# Patient Record
Sex: Female | Born: 1955
Health system: Southern US, Community
[De-identification: ages and names within clinical notes are randomized; demographics above are authoritative.]

## PROBLEM LIST (undated history)

## (undated) DIAGNOSIS — I400 Infective myocarditis: Secondary | ICD-10-CM

## (undated) DIAGNOSIS — K219 Gastro-esophageal reflux disease without esophagitis: Secondary | ICD-10-CM

## (undated) DIAGNOSIS — I10 Essential (primary) hypertension: Secondary | ICD-10-CM

## (undated) DIAGNOSIS — I2510 Atherosclerotic heart disease of native coronary artery without angina pectoris: Secondary | ICD-10-CM

## (undated) DIAGNOSIS — C50919 Malignant neoplasm of unspecified site of unspecified female breast: Secondary | ICD-10-CM

## (undated) DIAGNOSIS — I272 Pulmonary hypertension, unspecified: Secondary | ICD-10-CM

## (undated) DIAGNOSIS — I219 Acute myocardial infarction, unspecified: Secondary | ICD-10-CM

## (undated) HISTORY — DX: Pulmonary hypertension, unspecified: I27.20

## (undated) HISTORY — DX: Malignant neoplasm of unspecified site of unspecified female breast: C50.919

## (undated) HISTORY — PX: OTHER SURGICAL HISTORY: SHX169

## (undated) HISTORY — PX: CARDIAC CATHETERIZATION: SHX172

---

## 2009-05-04 ENCOUNTER — Ambulatory Visit (HOSPITAL_COMMUNITY): Admission: RE | Admit: 2009-05-04 | Discharge: 2009-05-04 | Payer: Self-pay | Admitting: Family Medicine

## 2010-08-16 ENCOUNTER — Other Ambulatory Visit (HOSPITAL_COMMUNITY): Payer: Self-pay | Admitting: Family Medicine

## 2010-08-16 ENCOUNTER — Ambulatory Visit (HOSPITAL_COMMUNITY)
Admission: RE | Admit: 2010-08-16 | Discharge: 2010-08-16 | Disposition: A | Discharge status: Home / Self Care | Payer: Self-pay | Source: Ambulatory Visit | Attending: Family Medicine | Admitting: Family Medicine

## 2010-08-16 ENCOUNTER — Other Ambulatory Visit: Payer: Self-pay | Admitting: *Deleted

## 2010-08-16 DIAGNOSIS — L0390 Cellulitis, unspecified: Secondary | ICD-10-CM

## 2010-08-16 DIAGNOSIS — M79609 Pain in unspecified limb: Secondary | ICD-10-CM | POA: Insufficient documentation

## 2011-05-23 ENCOUNTER — Other Ambulatory Visit (HOSPITAL_COMMUNITY): Payer: Self-pay | Admitting: Family Medicine

## 2011-05-23 DIAGNOSIS — Z139 Encounter for screening, unspecified: Secondary | ICD-10-CM

## 2011-06-01 ENCOUNTER — Ambulatory Visit (HOSPITAL_COMMUNITY)
Admission: RE | Admit: 2011-06-01 | Discharge: 2011-06-01 | Disposition: A | Discharge status: Home / Self Care | Payer: PRIVATE HEALTH INSURANCE | Source: Ambulatory Visit | Attending: Family Medicine | Admitting: Family Medicine

## 2011-06-01 DIAGNOSIS — Z139 Encounter for screening, unspecified: Secondary | ICD-10-CM

## 2011-06-01 DIAGNOSIS — Z1231 Encounter for screening mammogram for malignant neoplasm of breast: Secondary | ICD-10-CM | POA: Insufficient documentation

## 2012-01-08 ENCOUNTER — Emergency Department (HOSPITAL_COMMUNITY)
Admission: EM | Admit: 2012-01-08 | Discharge: 2012-01-08 | Disposition: A | Discharge status: Home / Self Care | Payer: Self-pay | Attending: Emergency Medicine | Admitting: Emergency Medicine

## 2012-01-08 ENCOUNTER — Encounter (HOSPITAL_COMMUNITY): Payer: Self-pay | Admitting: Emergency Medicine

## 2012-01-08 DIAGNOSIS — I10 Essential (primary) hypertension: Secondary | ICD-10-CM | POA: Insufficient documentation

## 2012-01-08 DIAGNOSIS — L259 Unspecified contact dermatitis, unspecified cause: Secondary | ICD-10-CM | POA: Insufficient documentation

## 2012-01-08 DIAGNOSIS — E119 Type 2 diabetes mellitus without complications: Secondary | ICD-10-CM | POA: Insufficient documentation

## 2012-01-08 HISTORY — DX: Essential (primary) hypertension: I10

## 2012-01-08 LAB — GLUCOSE, CAPILLARY

## 2012-01-08 MED ORDER — FAMOTIDINE 40 MG PO TABS: 40.0000 mg | ORAL_TABLET | Freq: Every day | ORAL | Status: DC

## 2012-01-08 MED ORDER — PREDNISONE 10 MG PO TABS: ORAL_TABLET | ORAL | Status: DC

## 2012-01-08 NOTE — ED Provider Notes (Cosign Needed)
History  This chart was scribed for Ward Givens, MD by Shari Heritage. The patient was seen in room APA11/APA11. Patient's care was started at 0729.     CSN: [id number]  Arrival date & time 01/08/12  0729   First MD Initiated Contact with Patient 01/08/12 [phone]      Chief Complaint  Patient presents with  . Rash    Patient is a 56 y.o. female presenting with rash. The history is provided by the patient. No language interpreter was used.  Rash  This is a new problem. The current episode started more than 1 week ago. The problem has not changed since onset.The problem is associated with an unknown factor. There has been no fever. The rash is present on the torso, left arm and right arm. Associated symptoms include itching. Pertinent negatives include no pain. Treatments tried: bleach.    HAMPTON COST is a 56 y.o. female who presents to the Emergency Department complaining of a rash onset 12 days ago. Patient describes the rash as blistery, darkened spots to her skin with associated itching. The rash started on her face with associated swelling, and spread to her breasts and arms. Patient states that the rash is mostly gone from her face, but it is still present on her arms and breasts. She says that she hadn't been working in the yard prior to the onset. Patient says that she uses various soaps and detergents, but she has used them all before. She denies having worn new clothes or eaten new foods recently. She states her son put out moth balls and sulfur around the house at about the same time because they had seen a snake. She states she washes his clothes. She hasn't been seen for this rash before. Pt states it looked like a poison ivy rash when it started. She has been putting bleach on it.   Patient works as a Electrical engineer. Patient has a medical history of HTN and diabetes. Patient takes metformin and lisinopril.    PCP - [facility]  Past Medical History    Diagnosis Date  . Hypertension   . Diabetes mellitus     Past Surgical History  Procedure Date  . Cesarean section     History reviewed. No pertinent family history.  History  Substance Use Topics  . Smoking status: Never Smoker   . Smokeless tobacco: Not on file  . Alcohol Use: No  employed Lives with son  OB History    Grav Para Term Preterm Abortions TAB SAB Ect Mult Living                  Review of Systems  Skin: Positive for itching and rash.  All other systems reviewed and are negative.    Allergies  Review of patient's allergies indicates no known allergies.  Home Medications   Previous Medications   ASPIRIN EC 81 MG TABLET    Take 81 mg by mouth daily.   BIMATOPROST (LUMIGAN) 0.01 % SOLN    Place 1 drop into both eyes at bedtime.   GLIMEPIRIDE (AMARYL) 4 MG TABLET    Take 4 mg by mouth daily.   LISINOPRIL-HYDROCHLOROTHIAZIDE (PRINZIDE,ZESTORETIC) 20-25 MG PER TABLET    Take 1 tablet by mouth daily.   METFORMIN (GLUCOPHAGE) 1000 MG TABLET    Take 1,000 mg by mouth 2 (two) times daily with a meal.   PRENATAL VIT-FE FUMARATE-FA (PRENATAL MULTIVITAMIN) TABS    Take 1 tablet by  mouth daily.    Vital signs BP 162/87  Pulse 80  Temp 98.3 F (36.8 C) (Oral)  Resp 20  Ht 5' 6.5" (1.689 m)  Wt 300 lb (136.079 kg)  BMI 47.70 kg/m2  SpO2 100%  Vital signs normal    Physical Exam  Constitutional: She is oriented to person, place, and time. She appears well-developed and well-nourished. No distress.  HENT:  Head: Normocephalic and atraumatic.  Right Ear: External ear normal.  Left Ear: External ear normal.  Eyes: Conjunctivae and EOM are normal. Pupils are equal, round, and reactive to light.  Neck: Normal range of motion. Neck supple.  Cardiovascular: Normal rate.   Pulmonary/Chest: Effort normal. No respiratory distress.  Musculoskeletal: Normal range of motion.  Neurological: She is alert and oriented to person, place, and time.  Skin: Rash  noted. Rash is vesicular.       Scattered red-based vesicles on left upper inner arm and inner antecubital area and breasts bilaterally.  Linear rash on volar aspect of right forearm that is hyperpigmented. Round nickle-sized area on the inner aspect of her innear right arm that is hyperpigmented.  No rash on face now.  Has clustering of hyperpigmented skin around her neck with a few small vesicles.  Psychiatric: She has a normal mood and affect. Her behavior is normal.    ED Course  Procedures (including critical care time) DIAGNOSTIC STUDIES: Oxygen Saturation is 100% on room air, normal by my interpretation.    COORDINATION OF CARE: 7:53am- Patient informed of current plan for treatment and evaluation and agrees with plan at this time.   Results for orders placed during the hospital encounter of 01/08/12  GLUCOSE, CAPILLARY      Component Value Range   Glucose-Capillary 128 (*) 70 - 99 mg/dL   Comment 1 Documented in Chart     Laboratory interpretation all normal except mild hyperglycemia    1. Contact dermatitis     New Prescriptions   FAMOTIDINE (PEPCID) 40 MG TABLET    Take 1 tablet (40 mg total) by mouth daily.   PREDNISONE (DELTASONE) 10 MG TABLET    Take 3 po QD x 3d , then 2 po QD x 3d then 1 po QD x 3d  Benadryl or claritin OTC  Plan discharge  Devoria Albe, MD, FACEP   MDM   I personally performed the services described in this documentation, which was scribed in my presence. The recorded information has been reviewed and considered.  Devoria Albe, MD, FACEP    Ward Givens, MD 01/08/12 [phone]

## 2012-01-08 NOTE — ED Notes (Signed)
Rash generalized to body. Pt states was treating for poison ivy but now unsure it that's what it is.

## 2012-01-08 NOTE — ED Notes (Signed)
Pt  POCT CBG reading 128.

## 2012-01-25 NOTE — Procedures (Signed)
NAMESENAYA, DICENSO            ACCOUNT NO.:  [id number]  MEDICAL RECORD NO.:  [id number]  LOCATION:  APA11                         FACILITY:  [facility]  PHYSICIAN:  Aricka Goldberger L. Juanetta Gosling, M.D.DATE OF BIRTH:  Sep 05, 1955  DATE OF PROCEDURE: DATE OF DISCHARGE:  01/08/2012                           PULMONARY FUNCTION TEST   Reason for pulmonary function testing is pneumonia and COPD. 1. Spirometry shows a moderate-to-severe ventilatory defect with     airflow obstruction. 2. Lung volumes show air trapping. 3. Airway resistance is elevated confirming the presence of airflow     obstruction. 4. There is significant improvement with inhaled bronchodilator.     Sidni Fusco L. Juanetta Gosling, M.D.     ELH/MEDQ  D:  01/24/2012  T:  01/25/2012  Job:  [id number]

## 2012-06-23 ENCOUNTER — Inpatient Hospital Stay (HOSPITAL_COMMUNITY)
Admission: AD | Admit: 2012-06-23 | Discharge: 2012-06-25 | DRG: 247 | Disposition: A | Discharge status: Home / Self Care | Payer: MEDICAID | Source: Other Acute Inpatient Hospital | Attending: Internal Medicine | Admitting: Internal Medicine

## 2012-06-23 ENCOUNTER — Inpatient Hospital Stay (HOSPITAL_COMMUNITY): Admit: 2012-06-23 | Payer: Self-pay | Admitting: Internal Medicine

## 2012-06-23 ENCOUNTER — Encounter (HOSPITAL_COMMUNITY): Payer: Self-pay | Admitting: *Deleted

## 2012-06-23 DIAGNOSIS — E1165 Type 2 diabetes mellitus with hyperglycemia: Secondary | ICD-10-CM

## 2012-06-23 DIAGNOSIS — Z23 Encounter for immunization: Secondary | ICD-10-CM

## 2012-06-23 DIAGNOSIS — IMO0001 Reserved for inherently not codable concepts without codable children: Secondary | ICD-10-CM | POA: Diagnosis present

## 2012-06-23 DIAGNOSIS — I10 Essential (primary) hypertension: Secondary | ICD-10-CM | POA: Diagnosis present

## 2012-06-23 DIAGNOSIS — D649 Anemia, unspecified: Secondary | ICD-10-CM | POA: Diagnosis present

## 2012-06-23 DIAGNOSIS — Z79899 Other long term (current) drug therapy: Secondary | ICD-10-CM

## 2012-06-23 DIAGNOSIS — K219 Gastro-esophageal reflux disease without esophagitis: Secondary | ICD-10-CM | POA: Diagnosis present

## 2012-06-23 DIAGNOSIS — Z6841 Body Mass Index (BMI) 40.0 and over, adult: Secondary | ICD-10-CM

## 2012-06-23 DIAGNOSIS — E785 Hyperlipidemia, unspecified: Secondary | ICD-10-CM

## 2012-06-23 DIAGNOSIS — Z955 Presence of coronary angioplasty implant and graft: Secondary | ICD-10-CM

## 2012-06-23 DIAGNOSIS — I214 Non-ST elevation (NSTEMI) myocardial infarction: Secondary | ICD-10-CM

## 2012-06-23 DIAGNOSIS — I2510 Atherosclerotic heart disease of native coronary artery without angina pectoris: Secondary | ICD-10-CM | POA: Diagnosis present

## 2012-06-23 DIAGNOSIS — E119 Type 2 diabetes mellitus without complications: Secondary | ICD-10-CM

## 2012-06-23 DIAGNOSIS — R072 Precordial pain: Secondary | ICD-10-CM

## 2012-06-23 DIAGNOSIS — Z7982 Long term (current) use of aspirin: Secondary | ICD-10-CM

## 2012-06-23 DIAGNOSIS — E669 Obesity, unspecified: Secondary | ICD-10-CM | POA: Diagnosis present

## 2012-06-23 HISTORY — DX: Gastro-esophageal reflux disease without esophagitis: K21.9

## 2012-06-23 LAB — TROPONIN I: Troponin I: 0.38 ng/mL (ref <0.30)

## 2012-06-23 LAB — HEPARIN LEVEL (UNFRACTIONATED): Heparin Unfractionated: 0.19 IU/mL — ABNORMAL LOW (ref 0.30–0.70)

## 2012-06-23 LAB — PRO B NATRIURETIC PEPTIDE: Pro B Natriuretic peptide (BNP): 173.3 pg/mL — ABNORMAL HIGH (ref 0–125)

## 2012-06-23 LAB — PROTIME-INR: INR: 1.05 (ref 0.00–1.49)

## 2012-06-23 MED ORDER — HYDROCHLOROTHIAZIDE 25 MG PO TABS
25.0000 mg | ORAL_TABLET | Freq: Every day | ORAL | Status: DC
  Administered 2012-06-24 – 2012-06-25 (×2): 25 mg via ORAL
  Filled 2012-06-23 (×2): qty 1

## 2012-06-23 MED ORDER — HEPARIN BOLUS VIA INFUSION
2000.0000 [IU] | Freq: Once | INTRAVENOUS | Status: AC
  Filled 2012-06-23: qty 2000

## 2012-06-23 MED ORDER — PNEUMOCOCCAL VAC POLYVALENT 25 MCG/0.5ML IJ INJ
0.5000 mL | INJECTION | INTRAMUSCULAR | Status: AC
  Administered 2012-06-24: 0.5 mL via INTRAMUSCULAR
  Filled 2012-06-23: qty 0.5

## 2012-06-23 MED ORDER — MORPHINE SULFATE 4 MG/ML IJ SOLN: 4.0000 mg | INTRAMUSCULAR | Status: DC | PRN

## 2012-06-23 MED ORDER — ZOLPIDEM TARTRATE 5 MG PO TABS
5.0000 mg | ORAL_TABLET | Freq: Every evening | ORAL | Status: DC | PRN
  Administered 2012-06-24: 5 mg via ORAL
  Filled 2012-06-23: qty 1

## 2012-06-23 MED ORDER — ATORVASTATIN CALCIUM 80 MG PO TABS
80.0000 mg | ORAL_TABLET | Freq: Every day | ORAL | Status: DC
  Administered 2012-06-23 – 2012-06-24 (×2): 80 mg via ORAL
  Filled 2012-06-23 (×3): qty 1

## 2012-06-23 MED ORDER — ALPRAZOLAM 0.25 MG PO TABS: 0.2500 mg | ORAL_TABLET | Freq: Two times a day (BID) | ORAL | Status: DC | PRN

## 2012-06-23 MED ORDER — ONDANSETRON HCL 4 MG/2ML IJ SOLN: 4.0000 mg | Freq: Four times a day (QID) | INTRAMUSCULAR | Status: DC | PRN

## 2012-06-23 MED ORDER — MORPHINE SULFATE 10 MG/5ML PO SOLN: 4.0000 mg | ORAL | Status: DC | PRN

## 2012-06-23 MED ORDER — BIMATOPROST 0.01 % OP SOLN
1.0000 [drp] | Freq: Every day | OPHTHALMIC | Status: DC
  Administered 2012-06-23: 1 [drp] via OPHTHALMIC
  Filled 2012-06-23: qty 2.5

## 2012-06-23 MED ORDER — HEPARIN (PORCINE) IN NACL 100-0.45 UNIT/ML-% IJ SOLN
1550.0000 [IU]/h | INTRAMUSCULAR | Status: DC
  Administered 2012-06-23: 200000 [IU]/h via INTRAVENOUS
  Administered 2012-06-24: 1550 [IU]/h via INTRAVENOUS
  Filled 2012-06-23 (×3): qty 250

## 2012-06-23 MED ORDER — ACETAMINOPHEN 325 MG PO TABS: 650.0000 mg | ORAL_TABLET | ORAL | Status: DC | PRN

## 2012-06-23 MED ORDER — NITROGLYCERIN 0.4 MG SL SUBL: 0.4000 mg | SUBLINGUAL_TABLET | SUBLINGUAL | Status: DC | PRN

## 2012-06-23 MED ORDER — METOPROLOL TARTRATE 25 MG PO TABS
25.0000 mg | ORAL_TABLET | Freq: Two times a day (BID) | ORAL | Status: DC
  Administered 2012-06-23 – 2012-06-25 (×4): 25 mg via ORAL
  Filled 2012-06-23 (×5): qty 1

## 2012-06-23 MED ORDER — HEPARIN BOLUS VIA INFUSION
4000.0000 [IU] | Freq: Once | INTRAVENOUS | Status: DC
  Filled 2012-06-23: qty 4000

## 2012-06-23 MED ORDER — LISINOPRIL-HYDROCHLOROTHIAZIDE 20-25 MG PO TABS: 1.0000 | ORAL_TABLET | Freq: Every day | ORAL | Status: DC

## 2012-06-23 MED ORDER — LISINOPRIL 20 MG PO TABS
20.0000 mg | ORAL_TABLET | Freq: Every day | ORAL | Status: DC
  Administered 2012-06-24 – 2012-06-25 (×2): 20 mg via ORAL
  Filled 2012-06-23 (×2): qty 1

## 2012-06-23 MED ORDER — ASPIRIN EC 81 MG PO TBEC
81.0000 mg | DELAYED_RELEASE_TABLET | Freq: Every day | ORAL | Status: DC
  Administered 2012-06-23: 81 mg via ORAL
  Filled 2012-06-23 (×2): qty 1

## 2012-06-23 MED ORDER — GLIMEPIRIDE 4 MG PO TABS
4.0000 mg | ORAL_TABLET | Freq: Every day | ORAL | Status: DC
  Administered 2012-06-25: 09:00:00 4 mg via ORAL
  Filled 2012-06-23 (×3): qty 1

## 2012-06-23 NOTE — H&P (Signed)
Sandra Schneider is an 57 y.o. female.    Chief Complaint: Palpitation and chest pain  HPI: 57 y/o female who was transferred from [facility] for chest pain and abnormal troponins. She has a PMH of HTN, DM, hyperlipidemia and family history of premature coronary artery disease (father MI at age 89, sister MI at age 28, brother MI at age 63 and oldest son with MI at age 44).  She complain of 2 weeks history of intermittent palpitations and chest pain that has gotten worse over the last 24 hours. Chest pain felt like tightness, last about 5 minutes before spontaneous relief and is associated with shortness of breath, nausea. Denies vomiting, diaphoresis, PND, orthopnea or syncope.  At [facility], EKG did not show ST elevation, but her cardiac markers showed CK 191, MB 4.6 and troponin of 0.17 which is abnormal.  Patient was transferred to [facility] to undergo a cardiac catheterization tomorrow. She is currently on Heparin drip.  She is chest pain free and hemodynamically stable.  Past Medical History  Diagnosis Date  . Hypertension   . Diabetes mellitus   . GERD (gastroesophageal reflux disease)     Past Surgical History  Procedure Laterality Date  . Cesarean section      No family history on file. Social History:  reports that she has never smoked. She does not have any smokeless tobacco history on file. She reports that she does not drink alcohol or use illicit drugs.  Allergies: No Known Allergies  Medications Prior to Admission  Medication Sig Dispense Refill  . aspirin EC 81 MG tablet Take 81 mg by mouth daily.      . bimatoprost (LUMIGAN) 0.01 % SOLN Place 1 drop into both eyes at bedtime.      . famotidine (PEPCID) 40 MG tablet Take 1 tablet (40 mg total) by mouth daily.  20 tablet  0  . glimepiride (AMARYL) 4 MG tablet Take 4 mg by mouth daily.      Marland Kitchen ibuprofen (ADVIL,MOTRIN) 800 MG tablet Take 800 mg by mouth every 8 (eight) hours as needed for pain.      Marland Kitchen  lisinopril-hydrochlorothiazide (PRINZIDE,ZESTORETIC) 20-25 MG per tablet Take 1 tablet by mouth daily.      . metFORMIN (GLUCOPHAGE) 1000 MG tablet Take 1,000 mg by mouth 2 (two) times daily with a meal.      . Prenatal Vit-Fe Fumarate-FA (PRENATAL MULTIVITAMIN) TABS Take 1 tablet by mouth daily.        No results found for this or any previous visit (from the past 48 hour(s)). No results found.  Review of Systems  Constitutional: Negative for fever, chills, weight loss, malaise/fatigue and diaphoresis.  HENT: Negative for hearing loss, ear pain, nosebleeds, congestion, sore throat, neck pain, tinnitus and ear discharge.   Eyes: Negative for blurred vision, double vision, photophobia, pain, discharge and redness.  Respiratory: Positive for shortness of breath. Negative for cough, hemoptysis, sputum production, wheezing and stridor.   Cardiovascular: Positive for chest pain and palpitations. Negative for orthopnea, claudication, leg swelling and PND.  Gastrointestinal: Negative for heartburn, nausea, vomiting, abdominal pain, diarrhea, constipation and blood in stool.  Genitourinary: Negative for dysuria, urgency, frequency, hematuria and flank pain.  Musculoskeletal: Negative for myalgias, back pain and joint pain.  Skin: Negative for itching and rash.  Neurological: Negative for dizziness, tingling, tremors, sensory change, speech change, focal weakness, seizures, weakness and headaches.  Psychiatric/Behavioral: Negative for suicidal ideas and hallucinations.    Blood pressure  140/48, pulse 75, temperature 98.4 F (36.9 C), temperature source Oral, resp. rate 18, height 5\' 6"  (1.676 m), weight 135.444 kg (298 lb 9.6 oz), last menstrual period 01/14/2011, SpO2 99.00%. Physical Exam  Constitutional: She is oriented to person, place, and time. She appears well-developed and well-nourished. No distress.  Patient is obese  HENT:  Head: Normocephalic and atraumatic.  Eyes: EOM are normal.  Right eye exhibits no discharge. Left eye exhibits no discharge. No scleral icterus.  Neck: Normal range of motion. Neck supple. No JVD present.  Cardiovascular: Normal rate, regular rhythm and normal heart sounds.  Exam reveals no gallop and no friction rub.   No murmur heard. Respiratory: Effort normal and breath sounds normal. No stridor. No respiratory distress. She has no wheezes. She has no rales. She exhibits no tenderness.  GI: She exhibits no distension. There is no tenderness. There is no rebound and no guarding.  Musculoskeletal: She exhibits no edema and no tenderness.  Neurological: She is alert and oriented to person, place, and time.  Skin: No rash noted. She is not diaphoretic. No erythema.  Psychiatric: She has a normal mood and affect.     EKG: sinus rhythm (71 bpm) with non-specific T-wave abnormalities. No ST elevation.  Assessment/Plan  1.  NSTEMI 2.  DM 3.  HTN 4.  Obesity  Patient presented to [facility] with palpitation and associated chest pain and was found to have NSTEMI.  She is currently on Heparin drip and is chest pain free.  Given the extensive family history of premature coronary artery disease, she likely has coronary artery disease as well.  I will continue Aspirin, beta-blocker and high intensity statin.  If she develops recurrent chest pain, we will treat her with nitrates and prn morphine.  I will keep patient NPO for cardiac cath in the morning and will obtain a transthoracic echocardiogram to evaluate her LV function and valves. Since we plan on cardiac cath in the morning, I will hold her Metformin.  Sandra Schneider E 06/23/2012, 7:26 PM

## 2012-06-23 NOTE — Progress Notes (Signed)
Patients troponin is 0.38, patient is not symptomatic and stable.  Notified on call PA Hurman Horn. No new order received. Will continue monitoring.

## 2012-06-23 NOTE — Progress Notes (Addendum)
ANTICOAGULATION CONSULT NOTE - Initial Consult  Pharmacy Consult for heparin Indication: chest pain/ACS  No Known Allergies  Patient Measurements: Height: 5\' 6"  (167.6 cm) Weight: 298 lb 9.6 oz (135.444 kg) IBW/kg (Calculated) : 59.3 Heparin Dosing Weight: ~90 kg  Vital Signs: Temp: 98.4 F (36.9 C) (02/09 1808) Temp src: Oral (02/09 1808) BP: 140/48 mmHg (02/09 1808) Pulse Rate: 75 (02/09 1808)  Labs: No results found for this basename: HGB, HCT, PLT, APTT, LABPROT, INR, HEPARINUNFRC, CREATININE, CKTOTAL, CKMB, TROPONINI,  in the last 72 hours  CrCl is unknown because no creatinine reading has been taken.   Medical History: Past Medical History  Diagnosis Date  . Hypertension   . Diabetes mellitus   . GERD (gastroesophageal reflux disease)     Medications:  Prescriptions prior to admission  Medication Sig Dispense Refill  . aspirin EC 81 MG tablet Take 81 mg by mouth daily.      . bimatoprost (LUMIGAN) 0.01 % SOLN Place 1 drop into both eyes at bedtime.      . famotidine (PEPCID) 40 MG tablet Take 1 tablet (40 mg total) by mouth daily.  20 tablet  0  . glimepiride (AMARYL) 4 MG tablet Take 4 mg by mouth daily.      Marland Kitchen ibuprofen (ADVIL,MOTRIN) 800 MG tablet Take 800 mg by mouth every 8 (eight) hours as needed for pain.      Marland Kitchen lisinopril-hydrochlorothiazide (PRINZIDE,ZESTORETIC) 20-25 MG per tablet Take 1 tablet by mouth daily.      . metFORMIN (GLUCOPHAGE) 1000 MG tablet Take 1,000 mg by mouth 2 (two) times daily with a meal.      . Prenatal Vit-Fe Fumarate-FA (PRENATAL MULTIVITAMIN) TABS Take 1 tablet by mouth daily.        Assessment: Sandra Schneider is a 57 yo F transferred from [facility] w/ chest pain to continue heparin per pharmacy protocol for chest pain/ACS.   Heparin drip is currently running at 12.4 ml/hr = 1240 units/hr.  Wt 135 kg.  ABW ~ 90 kg.  Per [facility] records her baseline CBC, creat and INR are normal. She was given a 5000 unit bolus and  1240 units/hr at 1249.  Goal of Therapy:  Heparin level 0.3-0.7 units/ml Monitor platelets by anticoagulation protocol: Yes   Plan:  1. Check heparin level now - will be at steady state 1. Change heparin rate to 1250 units/hr from 1240 units/hr 2. Daily HL and CBC while on heparin    Herby Abraham, Pharm.D. [phone] 06/23/2012 7:47 PM   Addendum:  Heparin level = 0.19  Plan: 1) Heparin 2000 units iv bolus x 1 2) Increase heparin to 1550 units / hr 3) Follow up AM heparin level   Thank you. Okey Regal, PharmD [phone]

## 2012-06-24 ENCOUNTER — Encounter (HOSPITAL_COMMUNITY)
Admission: AD | Disposition: A | Discharge status: Home / Self Care | Payer: Self-pay | Source: Other Acute Inpatient Hospital | Attending: Internal Medicine

## 2012-06-24 DIAGNOSIS — I214 Non-ST elevation (NSTEMI) myocardial infarction: Secondary | ICD-10-CM

## 2012-06-24 DIAGNOSIS — I219 Acute myocardial infarction, unspecified: Secondary | ICD-10-CM

## 2012-06-24 DIAGNOSIS — I2510 Atherosclerotic heart disease of native coronary artery without angina pectoris: Secondary | ICD-10-CM

## 2012-06-24 HISTORY — DX: Acute myocardial infarction, unspecified: I21.9

## 2012-06-24 HISTORY — PX: LEFT HEART CATHETERIZATION WITH CORONARY ANGIOGRAM: SHX5451

## 2012-06-24 HISTORY — PX: CORONARY STENT PLACEMENT: SHX1402

## 2012-06-24 LAB — BASIC METABOLIC PANEL
CO2: 27 mEq/L (ref 19–32)
Chloride: 96 mEq/L (ref 96–112)
Creatinine, Ser: 0.81 mg/dL (ref 0.50–1.10)
GFR calc Af Amer: >90 mL/min (ref >90)
Potassium: 4.3 mEq/L (ref 3.5–5.1)
Sodium: 132 mEq/L — ABNORMAL LOW (ref 135–145)

## 2012-06-24 LAB — TROPONIN I: Troponin I: 0.37 ng/mL (ref <0.30)

## 2012-06-24 LAB — CBC
MCV: 91.8 fL (ref 78.0–100.0)
Platelets: 311 10*3/uL (ref 150–400)
RBC: 3.54 MIL/uL — ABNORMAL LOW (ref 3.87–5.11)
WBC: 10.5 10*3/uL (ref 4.0–10.5)

## 2012-06-24 LAB — HEPARIN LEVEL (UNFRACTIONATED): Heparin Unfractionated: 0.58 IU/mL (ref 0.30–0.70)

## 2012-06-24 LAB — GLUCOSE, CAPILLARY: Glucose-Capillary: 167 mg/dL — ABNORMAL HIGH (ref 70–99)

## 2012-06-24 LAB — LIPID PANEL
Cholesterol: 156 mg/dL (ref 0–200)
Total CHOL/HDL Ratio: 4.2 RATIO
VLDL: 27 mg/dL (ref 0–40)

## 2012-06-24 LAB — HEMOGLOBIN A1C: Mean Plasma Glucose: 183 mg/dL — ABNORMAL HIGH (ref <117)

## 2012-06-24 SURGERY — LEFT HEART CATHETERIZATION WITH CORONARY ANGIOGRAM
Anesthesia: LOCAL

## 2012-06-24 MED ORDER — LIDOCAINE HCL (PF) 1 % IJ SOLN
INTRAMUSCULAR | Status: AC
  Filled 2012-06-24: qty 30

## 2012-06-24 MED ORDER — FENTANYL CITRATE 0.05 MG/ML IJ SOLN
INTRAMUSCULAR | Status: AC
  Filled 2012-06-24: qty 2

## 2012-06-24 MED ORDER — SODIUM CHLORIDE 0.9 % IV SOLN
0.2500 mg/kg/h | INTRAVENOUS | Status: DC
  Filled 2012-06-24: qty 250

## 2012-06-24 MED ORDER — BIVALIRUDIN 250 MG IV SOLR
INTRAVENOUS | Status: AC
  Filled 2012-06-24: qty 250

## 2012-06-24 MED ORDER — NITROGLYCERIN 1 MG/10 ML FOR IR/CATH LAB
INTRA_ARTERIAL | Status: AC
  Filled 2012-06-24: qty 10

## 2012-06-24 MED ORDER — PRASUGREL HCL 10 MG PO TABS
ORAL_TABLET | ORAL | Status: AC
  Administered 2012-06-25: 09:00:00 10 mg via ORAL
  Filled 2012-06-24: qty 6

## 2012-06-24 MED ORDER — ASPIRIN 81 MG PO CHEW
324.0000 mg | CHEWABLE_TABLET | ORAL | Status: AC
  Administered 2012-06-24: 324 mg via ORAL
  Filled 2012-06-24: qty 4

## 2012-06-24 MED ORDER — ASPIRIN EC 81 MG PO TBEC
81.0000 mg | DELAYED_RELEASE_TABLET | Freq: Every day | ORAL | Status: DC
  Administered 2012-06-25: 09:00:00 81 mg via ORAL
  Filled 2012-06-24: qty 1

## 2012-06-24 MED ORDER — VERAPAMIL HCL 2.5 MG/ML IV SOLN
INTRAVENOUS | Status: AC
  Filled 2012-06-24: qty 2

## 2012-06-24 MED ORDER — SODIUM CHLORIDE 0.9 % IV SOLN: INTRAVENOUS | Status: DC

## 2012-06-24 MED ORDER — PRASUGREL HCL 10 MG PO TABS
10.0000 mg | ORAL_TABLET | Freq: Every day | ORAL | Status: DC
  Administered 2012-06-25: 10 mg via ORAL
  Filled 2012-06-24: qty 1

## 2012-06-24 MED ORDER — SODIUM CHLORIDE 0.9 % IV SOLN
INTRAVENOUS | Status: AC
  Administered 2012-06-24: 22:00:00 via INTRAVENOUS

## 2012-06-24 MED ORDER — HEPARIN (PORCINE) IN NACL 2-0.9 UNIT/ML-% IJ SOLN
INTRAMUSCULAR | Status: AC
  Filled 2012-06-24: qty 1500

## 2012-06-24 MED ORDER — HEPARIN SODIUM (PORCINE) 1000 UNIT/ML IJ SOLN
INTRAMUSCULAR | Status: AC
  Filled 2012-06-24: qty 1

## 2012-06-24 MED ORDER — INSULIN ASPART 100 UNIT/ML ~~LOC~~ SOLN
0.0000 [IU] | Freq: Three times a day (TID) | SUBCUTANEOUS | Status: DC
  Administered 2012-06-25: 3 [IU] via SUBCUTANEOUS

## 2012-06-24 MED ORDER — INSULIN ASPART 100 UNIT/ML ~~LOC~~ SOLN: 0.0000 [IU] | Freq: Every day | SUBCUTANEOUS | Status: DC

## 2012-06-24 MED ORDER — SODIUM CHLORIDE 0.9 % IJ SOLN: 3.0000 mL | INTRAMUSCULAR | Status: DC | PRN

## 2012-06-24 MED ORDER — SODIUM CHLORIDE 0.9 % IV SOLN: 250.0000 mL | INTRAVENOUS | Status: DC | PRN

## 2012-06-24 MED ORDER — MIDAZOLAM HCL 2 MG/2ML IJ SOLN
INTRAMUSCULAR | Status: AC
  Filled 2012-06-24: qty 2

## 2012-06-24 MED ORDER — SODIUM CHLORIDE 0.9 % IV SOLN
0.2500 mg/kg/h | INTRAVENOUS | Status: AC
  Administered 2012-06-24: 0.25 mg/kg/h via INTRAVENOUS
  Filled 2012-06-24: qty 250

## 2012-06-24 MED ORDER — HEPARIN (PORCINE) IN NACL 2-0.9 UNIT/ML-% IJ SOLN
INTRAMUSCULAR | Status: AC
  Filled 2012-06-24: qty 1000

## 2012-06-24 MED ORDER — SODIUM CHLORIDE 0.9 % IJ SOLN: 3.0000 mL | Freq: Two times a day (BID) | INTRAMUSCULAR | Status: DC

## 2012-06-24 NOTE — CV Procedure (Signed)
Cardiac Cath Procedure Note:  Indication: NSTEMI  Procedures performed:  1) Selective coronary angiography 2) Left heart catheterization 3) Left ventriculogram  Description of procedure:   The risks and indication of the procedure were explained. Consent was signed and placed on the chart. An appropriate timeout was taken prior to the procedure. After a normal Allen's test was confirmed, the right wrist was prepped and draped in the routine sterile fashion and anesthetized with 1% local lidocaine.   A 5 FR arterial sheath was then placed in the right radial artery using a modified Seldinger technique. Systemic heparin was administered. 3mg  IV verapamil was given through the sheath. Standard catheters including a JL 3.5, JR4 and straight pigtail were used. All catheter exchanges were made over a wire.  Complications:  None apparent  Findings:  Ao Pressure: 125/59 (83) LV Pressure:  137/11/20 There was no signficant gradient across the aortic valve on pullback.  Left main: Short. Normal  LAD: Large vessel with proximal 80% lesion. Mid vessel 30%. Distal vessel has diffuse moderate disease.   LCX: Large vessel gives off 3 OMs. 99% midsection followed by 40%. Distal vessel is small with 70% lesion.   RCA: Dominant. Normal.   LV-gram done in the RAO projection: Ejection fraction = 60%. No regional wall motion abnormalities.   Assessment: 1. 2V CAD 2. Normal LV function 3. NSTEMI  Plan/Discussion:  Plan PCI of LCX & LAD.  Daniel Bensimhon 2:23 PM

## 2012-06-24 NOTE — Progress Notes (Signed)
ANTICOAGULATION CONSULT NOTE - Follow Up Consult  Pharmacy Consult for Heparin Indication: chest pain/ACS  No Known Allergies  Patient Measurements: Height: 5\' 6"  (167.6 cm) Weight: 299 lb 9.6 oz (135.898 kg) IBW/kg (Calculated) : 59.3 Heparin Dosing Weight: 90 kg  Vital Signs: Temp: 97.9 F (36.6 C) (02/10 0412) Temp src: Oral (02/10 0412) BP: 153/98 mmHg (02/10 0412) Pulse Rate: 81 (02/10 0412)  Labs:  Recent Labs  06/23/12 2031 06/23/12 2032 06/24/12 0102 06/24/12 0843 06/24/12 0846  HGB  --   --  10.8*  --   --   HCT  --   --  32.5*  --   --   PLT  --   --  311  --   --   APTT  --  66*  --   --   --   LABPROT  --  13.6  --   --   --   INR  --  1.05  --   --   --   HEPARINUNFRC  --  0.19* 0.58  --  0.47  CREATININE  --   --  0.81  --   --   TROPONINI 0.38*  --  0.37* 0.38*  --     Estimated Creatinine Clearance: 108.8 ml/min (by C-G formula based on Cr of 0.81).   Medications:  Scheduled:  . [COMPLETED] aspirin  324 mg Oral Pre-Cath  . [START ON 06/25/2012] aspirin EC  81 mg Oral Daily  . atorvastatin  80 mg Oral q1800  . bimatoprost  1 drop Both Eyes QHS  . glimepiride  4 mg Oral Q breakfast  . [COMPLETED] heparin  2,000 Units Intravenous Once  . lisinopril  20 mg Oral Daily   And  . hydrochlorothiazide  25 mg Oral Daily  . metoprolol tartrate  25 mg Oral BID  . [COMPLETED] pneumococcal 23 valent vaccine  0.5 mL Intramuscular Tomorrow-1000  . sodium chloride  3 mL Intravenous Q12H  . [DISCONTINUED] aspirin EC  81 mg Oral Daily  . [DISCONTINUED] heparin  4,000 Units Intravenous Once  . [DISCONTINUED] lisinopril-hydrochlorothiazide  1 tablet Oral Daily   Infusions:  . sodium chloride    . heparin 1,550 Units/hr (06/24/12 4540)    Assessment: 57 yo F transferred from [facility] with CP.  Continues on therapeutic heparin infusion at 1550 units/hr.  Noted plans to have cardiac cath later today.   Goal of Therapy:  Heparin level 0.3-0.7 units/ml    Plan:  Continue heparin at 1550 units/hr. Follow-up after cardiac cath later today.  [employer], Pharm.D., BCPS Clinical Pharmacist Pager [phone] 06/24/2012 10:31 AM

## 2012-06-24 NOTE — H&P (View-Only) (Signed)
 SUBJECTIVE:  No chest pain currently.  No SOB   PHYSICAL EXAM Filed Vitals:   06/23/12 1808 06/23/12 2034 06/24/12 0412  BP: 140/48 127/47 153/98  Pulse: 75 73 81  Temp: 98.4 F (36.9 C) 98.3 F (36.8 C) 97.9 F (36.6 C)  TempSrc: Oral Oral Oral  Resp: 18 19 19  Height: 5' 6" (1.676 m)    Weight: 298 lb 9.6 oz (135.444 kg)  299 lb 9.6 oz (135.898 kg)  SpO2: 99% 98% 97%   General:  No distress Lungs:  Clear Heart:  RRR Abdomen:  Positive bowel sounds, no rebound no guarding Extremities:  No edema.   LABS: Lab Results  Component Value Date   TROPONINI 0.37* 06/24/2012   Results for orders placed during the hospital encounter of 06/23/12 (from the past 24 hour(s))  TROPONIN I     Status: Abnormal   Collection Time    06/23/12  8:31 PM      Result Value Range   Troponin I 0.38 (*) <0.30 ng/mL  PRO B NATRIURETIC PEPTIDE     Status: Abnormal   Collection Time    06/23/12  8:31 PM      Result Value Range   Pro B Natriuretic peptide (BNP) 173.3 (*) 0 - 125 pg/mL  PROTIME-INR     Status: None   Collection Time    06/23/12  8:32 PM      Result Value Range   Prothrombin Time 13.6  11.6 - 15.2 seconds   INR 1.05  0.00 - 1.49  APTT     Status: Abnormal   Collection Time    06/23/12  8:32 PM      Result Value Range   aPTT 66 (*) 24 - 37 seconds  TSH     Status: None   Collection Time    06/23/12  8:32 PM      Result Value Range   TSH 1.406  0.350 - 4.500 uIU/mL  MAGNESIUM     Status: None   Collection Time    06/23/12  8:32 PM      Result Value Range   Magnesium 1.7  1.5 - 2.5 mg/dL  HEMOGLOBIN A1C     Status: Abnormal   Collection Time    06/23/12  8:32 PM      Result Value Range   Hemoglobin A1C 8.0 (*) <5.7 %   Mean Plasma Glucose 183 (*) <117 mg/dL  HEPARIN LEVEL (UNFRACTIONATED)     Status: Abnormal   Collection Time    06/23/12  8:32 PM      Result Value Range   Heparin Unfractionated 0.19 (*) 0.30 - 0.70 IU/mL  GLUCOSE, CAPILLARY     Status:  Abnormal   Collection Time    06/23/12  9:35 PM      Result Value Range   Glucose-Capillary 252 (*) 70 - 99 mg/dL   Comment 1 Documented in Chart     Comment 2 Notify RN    TROPONIN I     Status: Abnormal   Collection Time    06/24/12  1:02 AM      Result Value Range   Troponin I 0.37 (*) <0.30 ng/mL  CBC     Status: Abnormal   Collection Time    06/24/12  1:02 AM      Result Value Range   WBC 10.5  4.0 - 10.5 K/uL   RBC 3.54 (*) 3.87 - 5.11 MIL/uL   Hemoglobin 10.8 (*) 12.0 -   15.0 g/dL   HCT 32.5 (*) 36.0 - 46.0 %   MCV 91.8  78.0 - 100.0 fL   MCH 30.5  26.0 - 34.0 pg   MCHC 33.2  30.0 - 36.0 g/dL   RDW 13.0  11.5 - 15.5 %   Platelets 311  150 - 400 K/uL  BASIC METABOLIC PANEL     Status: Abnormal   Collection Time    06/24/12  1:02 AM      Result Value Range   Sodium 132 (*) 135 - 145 mEq/L   Potassium 4.3  3.5 - 5.1 mEq/L   Chloride 96  96 - 112 mEq/L   CO2 27  19 - 32 mEq/L   Glucose, Bld 195 (*) 70 - 99 mg/dL   BUN 19  6 - 23 mg/dL   Creatinine, Ser 0.81  0.50 - 1.10 mg/dL   Calcium 9.5  8.4 - 10.5 mg/dL   GFR calc non Af Amer 79 (*) >90 mL/min   GFR calc Af Amer >90  >90 mL/min  HEPARIN LEVEL (UNFRACTIONATED)     Status: None   Collection Time    06/24/12  1:02 AM      Result Value Range   Heparin Unfractionated 0.58  0.30 - 0.70 IU/mL  LIPID PANEL     Status: Abnormal   Collection Time    06/24/12  1:02 AM      Result Value Range   Cholesterol 156  0 - 200 mg/dL   Triglycerides 135  <150 mg/dL   HDL 37 (*) >39 mg/dL   Total CHOL/HDL Ratio 4.2     VLDL 27  0 - 40 mg/dL   LDL Cholesterol 92  0 - 99 mg/dL  GLUCOSE, CAPILLARY     Status: Abnormal   Collection Time    06/24/12  6:31 AM      Result Value Range   Glucose-Capillary 167 (*) 70 - 99 mg/dL   Comment 1 Documented in Chart     Comment 2 Notify RN      Intake/Output Summary (Last 24 hours) at 06/24/12 0827 Last data filed at 06/24/12 0116  Gross per 24 hour  Intake      0 ml  Output    850  ml  Net   -850 ml    EKG:  Sinus rate 73, no acute ST T wave changes. Poor anterior R wave progression.  06/24/2012  ASSESSMENT AND PLAN:  NSTEMI:  Cath later this morning.  Answered all questions.    Diabetes:   A1C is not at target.   We discussed this.  We discussed diet.  She eats sugar.  Continue current medications.  Obesity:  Educate.   She did not want a dietary consult.   HTN:  I will titrate ACE inhibitor of BP remains elevated.    Chrissy Ealey 06/24/2012 8:27 AM   

## 2012-06-24 NOTE — Progress Notes (Signed)
SUBJECTIVE:  No chest pain currently.  No SOB   PHYSICAL EXAM Filed Vitals:   06/23/12 1808 06/23/12 2034 06/24/12 0412  BP: 140/48 127/47 153/98  Pulse: 75 73 81  Temp: 98.4 F (36.9 C) 98.3 F (36.8 C) 97.9 F (36.6 C)  TempSrc: Oral Oral Oral  Resp: 18 19 19   Height: 5\' 6"  (1.676 m)    Weight: 298 lb 9.6 oz (135.444 kg)  299 lb 9.6 oz (135.898 kg)  SpO2: 99% 98% 97%   General:  No distress Lungs:  Clear Heart:  RRR Abdomen:  Positive bowel sounds, no rebound no guarding Extremities:  No edema.   LABS: Lab Results  Component Value Date   TROPONINI 0.37* 06/24/2012   Results for orders placed during the hospital encounter of 06/23/12 (from the past 24 hour(s))  TROPONIN I     Status: Abnormal   Collection Time    06/23/12  8:31 PM      Result Value Range   Troponin I 0.38 (*) <0.30 ng/mL  PRO B NATRIURETIC PEPTIDE     Status: Abnormal   Collection Time    06/23/12  8:31 PM      Result Value Range   Pro B Natriuretic peptide (BNP) 173.3 (*) 0 - 125 pg/mL  PROTIME-INR     Status: None   Collection Time    06/23/12  8:32 PM      Result Value Range   Prothrombin Time 13.6  11.6 - 15.2 seconds   INR 1.05  0.00 - 1.49  APTT     Status: Abnormal   Collection Time    06/23/12  8:32 PM      Result Value Range   aPTT 66 (*) 24 - 37 seconds  TSH     Status: None   Collection Time    06/23/12  8:32 PM      Result Value Range   TSH 1.406  0.350 - 4.500 uIU/mL  MAGNESIUM     Status: None   Collection Time    06/23/12  8:32 PM      Result Value Range   Magnesium 1.7  1.5 - 2.5 mg/dL  HEMOGLOBIN W1X     Status: Abnormal   Collection Time    06/23/12  8:32 PM      Result Value Range   Hemoglobin A1C 8.0 (*) <5.7 %   Mean Plasma Glucose 183 (*) <117 mg/dL  HEPARIN LEVEL (UNFRACTIONATED)     Status: Abnormal   Collection Time    06/23/12  8:32 PM      Result Value Range   Heparin Unfractionated 0.19 (*) 0.30 - 0.70 IU/mL  GLUCOSE, CAPILLARY     Status:  Abnormal   Collection Time    06/23/12  9:35 PM      Result Value Range   Glucose-Capillary 252 (*) 70 - 99 mg/dL   Comment 1 Documented in Chart     Comment 2 Notify RN    TROPONIN I     Status: Abnormal   Collection Time    06/24/12  1:02 AM      Result Value Range   Troponin I 0.37 (*) <0.30 ng/mL  CBC     Status: Abnormal   Collection Time    06/24/12  1:02 AM      Result Value Range   WBC 10.5  4.0 - 10.5 K/uL   RBC 3.54 (*) 3.87 - 5.11 MIL/uL   Hemoglobin 10.8 (*) 12.0 -  15.0 g/dL   HCT 16.1 (*) 09.6 - 04.5 %   MCV 91.8  78.0 - 100.0 fL   MCH 30.5  26.0 - 34.0 pg   MCHC 33.2  30.0 - 36.0 g/dL   RDW 40.9  81.1 - 91.4 %   Platelets 311  150 - 400 K/uL  BASIC METABOLIC PANEL     Status: Abnormal   Collection Time    06/24/12  1:02 AM      Result Value Range   Sodium 132 (*) 135 - 145 mEq/L   Potassium 4.3  3.5 - 5.1 mEq/L   Chloride 96  96 - 112 mEq/L   CO2 27  19 - 32 mEq/L   Glucose, Bld 195 (*) 70 - 99 mg/dL   BUN 19  6 - 23 mg/dL   Creatinine, Ser 7.82  0.50 - 1.10 mg/dL   Calcium 9.5  8.4 - 95.6 mg/dL   GFR calc non Af Amer 79 (*) >90 mL/min   GFR calc Af Amer >90  >90 mL/min  HEPARIN LEVEL (UNFRACTIONATED)     Status: None   Collection Time    06/24/12  1:02 AM      Result Value Range   Heparin Unfractionated 0.58  0.30 - 0.70 IU/mL  LIPID PANEL     Status: Abnormal   Collection Time    06/24/12  1:02 AM      Result Value Range   Cholesterol 156  0 - 200 mg/dL   Triglycerides 213  <086 mg/dL   HDL 37 (*) >57 mg/dL   Total CHOL/HDL Ratio 4.2     VLDL 27  0 - 40 mg/dL   LDL Cholesterol 92  0 - 99 mg/dL  GLUCOSE, CAPILLARY     Status: Abnormal   Collection Time    06/24/12  6:31 AM      Result Value Range   Glucose-Capillary 167 (*) 70 - 99 mg/dL   Comment 1 Documented in Chart     Comment 2 Notify RN      Intake/Output Summary (Last 24 hours) at 06/24/12 0827 Last data filed at 06/24/12 0116  Gross per 24 hour  Intake      0 ml  Output    850  ml  Net   -850 ml    EKG:  Sinus rate 73, no acute ST T wave changes. Poor anterior R wave progression.  06/24/2012  ASSESSMENT AND PLAN:  NSTEMI:  Cath later this morning.  Answered all questions.    Diabetes:   A1C is not at target.   We discussed this.  We discussed diet.  She eats sugar.  Continue current medications.  Obesity:  Educate.   She did not want a dietary consult.   HTN:  I will titrate ACE inhibitor of BP remains elevated.    Fayrene Fearing [facility] 06/24/2012 8:27 AM

## 2012-06-24 NOTE — Progress Notes (Signed)
  Echocardiogram 2D Echocardiogram has been performed.  Sandra Schneider 06/24/2012, 12:10 PM

## 2012-06-24 NOTE — Progress Notes (Signed)
ANTICOAGULATION CONSULT NOTE - Follow Up  Consult  Pharmacy Consult for heparin Indication: chest pain/ACS  No Known Allergies  Patient Measurements: Height: 5\' 6"  (167.6 cm) Weight: 298 lb 9.6 oz (135.444 kg) IBW/kg (Calculated) : 59.3 Heparin Dosing Weight: ~90 kg  Vital Signs: Temp: 98.3 F (36.8 C) (02/09 2034) Temp src: Oral (02/09 2034) BP: 127/47 mmHg (02/09 2034) Pulse Rate: 73 (02/09 2034)  Labs:  Recent Labs  06/23/12 2031 06/23/12 2032 06/24/12 0102  HGB  --   --  10.8*  HCT  --   --  32.5*  PLT  --   --  311  APTT  --  66*  --   LABPROT  --  13.6  --   INR  --  1.05  --   HEPARINUNFRC  --  0.19* 0.58  TROPONINI 0.38*  --   --     CrCl is unknown because no creatinine reading has been taken.   Medical History: Past Medical History  Diagnosis Date  . Hypertension   . Diabetes mellitus   . GERD (gastroesophageal reflux disease)     Medications:  Prescriptions prior to admission  Medication Sig Dispense Refill  . aspirin EC 81 MG tablet Take 81 mg by mouth daily.      . bimatoprost (LUMIGAN) 0.01 % SOLN Place 1 drop into both eyes at bedtime.      . famotidine (PEPCID) 40 MG tablet Take 1 tablet (40 mg total) by mouth daily.  20 tablet  0  . glimepiride (AMARYL) 4 MG tablet Take 4 mg by mouth daily.      Marland Kitchen ibuprofen (ADVIL,MOTRIN) 800 MG tablet Take 800 mg by mouth every 8 (eight) hours as needed for pain.      Marland Kitchen lisinopril-hydrochlorothiazide (PRINZIDE,ZESTORETIC) 20-25 MG per tablet Take 1 tablet by mouth daily.      . metFORMIN (GLUCOPHAGE) 1000 MG tablet Take 1,000 mg by mouth 2 (two) times daily with a meal.      . Prenatal Vit-Fe Fumarate-FA (PRENATAL MULTIVITAMIN) TABS Take 1 tablet by mouth daily.        Assessment: Sandra Schneider is a 57 yo F transferred from [facility] w/ chest pain to continue heparin per pharmacy protocol for chest pain/ACS.    Heparin level (0.58) is at-goal on 1550 units/hr.   Goal of Therapy:  Heparin  level 0.3-0.7 units/ml Monitor platelets by anticoagulation protocol: Yes   Plan:  1. Continue IV heparin at 1550 units/hr.  2. Heparin level in 6 hours to confirm dosing.   Lorre Munroe, PharmD 06/24/2012 1:47 AM

## 2012-06-24 NOTE — Progress Notes (Signed)
Utilization Review Completed.Sandra Schneider T2/02/2013

## 2012-06-24 NOTE — Interval H&P Note (Signed)
History and Physical Interval Note:  06/24/2012 1:45 PM  Sandra Schneider  has presented today for surgery, with the diagnosis of NSTEMI  The various methods of treatment have been discussed with the patient and family. After consideration of risks, benefits and other options for treatment, the patient has consented to  Procedure(s): LEFT HEART CATHETERIZATION WITH CORONARY ANGIOGRAM (N/A) as a surgical intervention .  The patient's history has been reviewed, patient examined, no change in status, stable for surgery.  I have reviewed the patient's chart and labs.  Questions were answered to the patient's satisfaction.     Vaanya Shambaugh

## 2012-06-24 NOTE — CV Procedure (Signed)
   Cardiac Catheterization Procedure Note  Name: Sandra HOEFER MRN: [id number] DOB: 1956-03-24  Procedure:  PTCA and stenting of the proximal/mid left circumflex with drug-eluting stent placement, direct stenting of the proximal LAD with drug-eluting stent.  Indication: Non-ST elevation myocardial infarction.  PCI Note:  Following the diagnostic procedure, the decision was made to proceed with PCI. The radial sheath was upsized to a 6 [place]. Weight-based bivalirudin was given for anticoagulation. Once a therapeutic ACT was achieved, a 6 [place] XB 3.0 guide catheter was inserted. The left main coronary artery was very short. The guiding catheter either engaged the left circumflex or the LAD. Otherwise it would disengage the left main coronary artery. Thus, the catheter was selectively placed in the left circumflex for the initial PCI and then in the LAD.  An intuition coronary guidewire was used to cross the lesion in the proximal left circumflex.  The lesion was predilated with a 2.5 x 15 mm balloon. I gave nitroglycerin to evaluate the second lesion in the mid left circumflex. It appeared worse and estimated to be 50-60%. Thus, I decided to cover both the proximal and mid lesions with one stent.  The lesion was then stented with a 3.0 x 32 mm Promus drug-eluting stent.  The stent was postdilated with a 3.5 noncompliant balloon.  Following PCI, there was 0% residual stenosis and TIMI-3 flow. Final angiography confirmed an excellent result. The wire was then removed. The guiding catheter engaged the LAD. The lesion in the proximal LAD was direct stented with a 3.0 x 20 mm Promus drug-eluting stent. The stent was postdilated proximally with a 3.5 x 15 mm noncompliant balloon. Angiography showed excellent results. The patient tolerated the procedure well. There were no immediate procedural complications. A TR band was used for radial hemostasis. The patient was transferred to the post catheterization  recovery area for further monitoring.  PCI Data: Vessel - proximal and mid left circumflex/Segment - proximal and mid Percent Stenosis (pre)  95% TIMI-flow 3 Stent 3.0 x 32 mm Promus drug-eluting stent postdilated with a 3.5 noncompliant balloon. Percent Stenosis (post) 0% TIMI-flow (post) 3   Vessel - LAD/Segment - proximal  Percent Stenosis (pre)  85% TIMI-flow 3 Stent 3.0 x 20 mm Promus drug-eluting stent postdilated with a 3.5 noncompliant balloon. Percent Stenosis (post) 0% TIMI-flow (post) 3  Final Conclusions:   Successful angioplasty and drug-eluting stent placement to the proximal/mid left circumflex as well as proximal left anterior descending artery.   Recommendations:  Recommend dual antiplatelet therapy for at least one year. Aggressive treatment of risk factors is recommended.  [name] 06/24/2012, 3:37 PM

## 2012-06-24 NOTE — Care Management Note (Signed)
    Page 1 of 1   06/25/2012     11:46:34 AM   CARE MANAGEMENT NOTE 06/25/2012  Patient:  Sandra Schneider, Sandra Schneider   Account Number:  [id number]  Date Initiated:  06/24/2012  Documentation initiated by:  AMERSON,JULIE  Subjective/Objective Assessment:   PT ADM ON 06/23/12 WITH NSTEMI.  PTA, PT INDEPENDENT OF ADLS.     Action/Plan:   WILL FOLLOW FOR HOME NEEDS AS PT PROGRESSES   Anticipated DC Date:  06/26/2012   Anticipated DC Plan:  HOME/SELF CARE         Choice offered to / List presented to:             Status of service:  In process, will continue to follow Medicare Important Message given?   (If response is "NO", the following Medicare IM given date fields will be blank) Date Medicare IM given:   Date Additional Medicare IM given:    Discharge Disposition:    Per UR Regulation:  Reviewed for med. necessity/level of care/duration of stay  If discussed at Long Length of Stay Meetings, dates discussed:    Comments:  06/25/12 @1040  Oletta Cohn, RN, BSN, NCM [phone] CM consulted on Effient.  Pt is self pay and currently utilizes [facility] $4 drug list.  She was given the Effient packet and co-pay card.  No other needs at this time.

## 2012-06-25 DIAGNOSIS — I10 Essential (primary) hypertension: Secondary | ICD-10-CM

## 2012-06-25 DIAGNOSIS — E119 Type 2 diabetes mellitus without complications: Secondary | ICD-10-CM

## 2012-06-25 DIAGNOSIS — D649 Anemia, unspecified: Secondary | ICD-10-CM

## 2012-06-25 DIAGNOSIS — I2510 Atherosclerotic heart disease of native coronary artery without angina pectoris: Secondary | ICD-10-CM

## 2012-06-25 DIAGNOSIS — E1165 Type 2 diabetes mellitus with hyperglycemia: Secondary | ICD-10-CM | POA: Insufficient documentation

## 2012-06-25 DIAGNOSIS — E785 Hyperlipidemia, unspecified: Secondary | ICD-10-CM

## 2012-06-25 DIAGNOSIS — I214 Non-ST elevation (NSTEMI) myocardial infarction: Secondary | ICD-10-CM | POA: Diagnosis present

## 2012-06-25 LAB — GLUCOSE, CAPILLARY

## 2012-06-25 LAB — CBC
HCT: 32.8 % — ABNORMAL LOW (ref 36.0–46.0)
MCH: 30.9 pg (ref 26.0–34.0)
MCHC: 33.8 g/dL (ref 30.0–36.0)
MCV: 91.4 fL (ref 78.0–100.0)
RDW: 13 % (ref 11.5–15.5)

## 2012-06-25 LAB — BASIC METABOLIC PANEL
BUN: 16 mg/dL (ref 6–23)
Creatinine, Ser: 0.86 mg/dL (ref 0.50–1.10)
GFR calc Af Amer: 85 mL/min — ABNORMAL LOW (ref >90)
GFR calc non Af Amer: 74 mL/min — ABNORMAL LOW (ref >90)
Glucose, Bld: 188 mg/dL — ABNORMAL HIGH (ref 70–99)

## 2012-06-25 MED ORDER — ATORVASTATIN CALCIUM 80 MG PO TABS: 80.0000 mg | ORAL_TABLET | Freq: Every day | ORAL | Status: DC

## 2012-06-25 MED ORDER — METOPROLOL TARTRATE 25 MG PO TABS: 25.0000 mg | ORAL_TABLET | Freq: Two times a day (BID) | ORAL | Status: DC

## 2012-06-25 MED ORDER — METFORMIN HCL 1000 MG PO TABS: 1000.0000 mg | ORAL_TABLET | Freq: Two times a day (BID) | ORAL | Status: DC

## 2012-06-25 MED ORDER — PRASUGREL HCL 10 MG PO TABS: 10.0000 mg | ORAL_TABLET | Freq: Every day | ORAL | Status: DC

## 2012-06-25 MED ORDER — NITROGLYCERIN 0.4 MG SL SUBL: 0.4000 mg | SUBLINGUAL_TABLET | SUBLINGUAL | Status: DC | PRN

## 2012-06-25 MED FILL — Dextrose Inj 5%: INTRAVENOUS | Qty: 50 | Status: AC

## 2012-06-25 NOTE — Discharge Summary (Addendum)
Discharge Summary   Patient ID: Sandra Schneider,  MRN: [id number], DOB/AGE: 58-May-1957 58 y.o.  Admit date: 06/23/2012 Discharge date: 06/25/2012  Primary Physician: Default, Provider, MD Primary Cardiologist: new- seen by Dr. Antoine Poche this admission; to follow-up in   Discharge Diagnoses Principal Problem:   NSTEMI (non-ST elevated myocardial infarction)  - s/p cardiac cath 06/24/12: revealing 80% prox, 30% mid LAD lesions, 99% mid, 40% mid, 70% distal LCx, LVEF 60%  - s/p PCI 06/24/12: DES-prox LAD, DES-mid LCx  - ASA/Effient x 12 months Active Problems:   CAD in native artery  - To resume ASA/Effient/ACEi/BB/statin/NTG SL PRN on discharge   Essential hypertension  - Antihypertensives will be up-titrated as needed as an outpatient   Uncontrolled diabetes mellitus  - Hgb A1C 8.0%  - Metformin to resume 48 hours post-cath  - Follow-up with PCP for further management   Hyperlipidemia  - High-dose atorvastatin added  - Check LFTs in 6 weeks   Normocytic anemia  - To follow-up with PCP for this   Morbid obesity  - Diet & exercise stressed  Allergies No Known Allergies  Diagnostic Studies/Procedures  CHEST X-RAY - AT [facility] - 06/23/12  No infiltrate or effusion, mildly enlarged cardiac silhouette,, possibly very mild pulmonary vascular congestion.   TRANSTHORACIC ECHOCARDIOGRAM - 06/24/12  - Left ventricle: The cavity size was normal. Wall thickness was normal. Systolic function was normal. The estimated ejection fraction was in the range of 55% to 60%. Wall motion was normal; there were no regional wall motion abnormalities. Doppler parameters are consistent with abnormal left ventricular relaxation (grade 1 diastolic dysfunction). Doppler parameters are consistent with high ventricular filling pressure.  - Mitral valve: Calcified annulus. - Left atrium: The atrium was mildly dilated. - Atrial septum: There was increased thickness of the septum,  consistent with lipomatous hypertrophy.  CARDIAC CATHETERIZATION - 06/24/12  Findings:  Ao Pressure: 125/59 (83)  LV Pressure: 137/11/20  There was no signficant gradient across the aortic valve on pullback.  Left main: Short. Normal  LAD: Large vessel with proximal 80% lesion. Mid vessel 30%. Distal vessel has diffuse moderate disease.  LCX: Large vessel gives off 3 OMs. 99% midsection followed by 40%. Distal vessel is small with 70% lesion.  RCA: Dominant. Normal.  LV-gram done in the RAO projection: Ejection fraction = 60%. No regional wall motion abnormalities.  Assessment:  1. 2V CAD  2. Normal LV function  3. NSTEMI  PERCUTANEOUS CORONARY INTERVENTION - 06/24/12  Vessel - proximal and mid left circumflex/Segment - proximal and mid  Percent Stenosis (pre) 95%  TIMI-flow 3  Stent 3.0 x 32 mm Promus drug-eluting stent postdilated with a 3.5 noncompliant balloon.  Percent Stenosis (post) 0%  TIMI-flow (post) 3  Vessel - LAD/Segment - proximal  Percent Stenosis (pre) 85%  TIMI-flow 3  Stent 3.0 x 20 mm Promus drug-eluting stent postdilated with a 3.5 noncompliant balloon.  Percent Stenosis (post) 0%  TIMI-flow (post) 3  Final Conclusions:  Successful angioplasty and drug-eluting stent placement to the proximal/mid left circumflex as well as proximal left anterior descending artery.   History of Present Illness   Sandra Schneider is a 57 y.o. African American female with past medical history significant for hypertension, diabetes, gastroesophageal reflux disease osteoarthritis. She had no prior cardiac history, but does endorse a significant family history of premature CAD. She presents more emergency department the morning of 06/19/38 with complaints of intermittent heart racing and chest discomfort. Heart racing and occurring for  the prior 2 weeks subsiding after she would sit up lasting generally about 5 minutes. The Friday prior to admission, she endorsed an episode of heart  racing with associated chest discomfort lasting for approximately 5 minutes described as "tired." There was no relation to meals or episodes of indigestion. EKG revealed no evidence  Of ischemia. And initial trop-I returned at 0.17, CK-MB 4.6 and total CK 191. BNP returned at 43. Free T4-1 0.03, TSH 0.70. Given her significant cardiac risk factors, the plan was made to pursue diagnostic cardiac catheterization to be performed at [facility].  She was heparinized. Indications, risks and benefits were discussed with the patient, the plan was made to proceed.  Hospital Course   She arrived on 06/24/12 in stable condition. her metformin was held in anticipation of cardiac catheterization. A subsequent troponin did return mildly elevated at 0.38. Chest pain remain well-controlled and heparin had been resumed on transfer. Prior to cardiac cath, she underwent TTE which revealed the above findings. Hemoglobin A1c did return elevated at 8%. She was then informed, consented and prepped for cardiac cath. As above this revealed 95% prox-mid LCx, 85% prox LAD, LVEF 60%. DES x 2 was placed to these lesions on subsequent PCI. The patient tolerated both procedures well without complications. Recommendation was made to continue dual antiplatelet therapy-ASA/Effient to x12 months. In addition, aggressive risk factor reduction is recommended. She was evaluated by Dr. Antoine Poche this morning and deemed stable for discharge. She will follow-up as noted below. She will be discharged on the medications outlined below. Effient assistance has been arranged. This information, including post-cath instructions, has been clearly outlined in the discharge AVS.   Discharge Vitals:  Blood pressure 119/50, pulse 74, temperature 97.8 F (36.6 C), temperature source Oral, resp. rate 16, height 5\' 6"  (1.676 m), weight 136 kg (299 lb 13.2 oz), last menstrual period 01/14/2011, SpO2 96.00%.   Weight change: 0.556 kg (1 lb 3.6  oz)  Labs: Recent Labs     06/24/12  0102  06/25/12  0611  WBC  10.5  8.5  HGB  10.8*  11.1*  HCT  32.5*  32.8*  MCV  91.8  91.4  PLT  311  319   Recent Labs Lab 06/24/12 0102 06/25/12 0611  NA 132* 135  K 4.3 4.4  CL 96 99  CO2 27 27  BUN 19 16  CREATININE 0.81 0.86  CALCIUM 9.5 9.5  GLUCOSE 195* 188*   Recent Labs     06/23/12  2032  HGBA1C  8.0*   Recent Labs     06/23/12  2031  06/24/12  0102  06/24/12  0843  TROPONINI  0.38*  0.37*  0.38*   Recent Labs     06/24/12  0102  CHOL  156  HDL  37*  LDLCALC  92  TRIG  135  CHOLHDL  4.2    Recent Labs  06/23/12 2032  TSH 1.406    Disposition:  Discharge Orders   Future Appointments Provider Department Dept Phone   07/02/2012 1:40 PM Jodelle Gross, NP [facility] [place] [phone]   Future Orders Complete By Expires     Amb Referral to Cardiac Rehabilitation  As directed     Diet - low sodium heart healthy  As directed     Increase activity slowly  As directed       Follow-up Information   Follow up with Joni Reining, NP On 07/02/2012. (At 1:40 PM for follow-up. )  Contact information:   [address] [place] [address] [phone]       Discharge Medications:    Medication List    STOP taking these medications       ibuprofen 800 MG tablet  Commonly known as:  ADVIL,MOTRIN      TAKE these medications       aspirin EC 81 MG tablet  Take 81 mg by mouth daily.     atorvastatin 80 MG tablet  Commonly known as:  LIPITOR  Take 1 tablet (80 mg total) by mouth daily at 6 PM.     famotidine 40 MG tablet  Commonly known as:  PEPCID  Take 1 tablet (40 mg total) by mouth daily.     glimepiride 4 MG tablet  Commonly known as:  AMARYL  Take 4 mg by mouth daily.     lisinopril-hydrochlorothiazide 20-25 MG per tablet  Commonly known as:  PRINZIDE,ZESTORETIC  Take 1 tablet by mouth daily.     LUMIGAN 0.01 % Soln  Generic drug:  bimatoprost  Place 1  drop into both eyes at bedtime.     metFORMIN 1000 MG tablet  Commonly known as:  GLUCOPHAGE  Take 1 tablet (1,000 mg total) by mouth 2 (two) times daily with a meal.  Start taking on:  06/27/2012     metoprolol tartrate 25 MG tablet  Commonly known as:  LOPRESSOR  Take 1 tablet (25 mg total) by mouth 2 (two) times daily.     nitroGLYCERIN 0.4 MG SL tablet  Commonly known as:  NITROSTAT  Place 1 tablet (0.4 mg total) under the tongue every 5 (five) minutes x 3 doses as needed for chest pain.     prasugrel 10 MG Tabs  Commonly known as:  EFFIENT  Take 1 tablet (10 mg total) by mouth daily.     prenatal multivitamin Tabs  Take 1 tablet by mouth daily.       Outstanding Labs/Studies: LFTs in 6 weeks  Duration of Discharge Encounter: Greater than 30 minutes including physician time.  Signed, R. Hurman Horn, PA-C 06/25/2012, 10:32 AM   Cardiology Attending  Agree with above. Ok for discharge.  Leonia Reeves.D.

## 2012-06-25 NOTE — Progress Notes (Signed)
CARDIAC REHAB PHASE I   PRE:  Rate/Rhythm: 74SR  BP:  Supine: 119/50  Sitting:   Standing:    SaO2:   MODE:  Ambulation: 800 ft   POST:  Rate/Rhythem: 91SR  BP:  Supine:   Sitting: 134/37  Standing:    SaO2:  0750-0853 Pt walked 800 ft with steady gait. Tolerated well. Right hip hurting a little with walk. Denied CP. Education completed with pt. Understanding voiced. Pt states that her HgA1C is usually better and she is going to do better with diet. Gave heart healthy and diabetic diets. Discussed CRP 2 and pt gave permission to refer to [place]. Left financial form for pt.  Duanne Limerick

## 2012-06-25 NOTE — Progress Notes (Signed)
SUBJECTIVE:  No chest pain.  No SOB   PHYSICAL EXAM Filed Vitals:   06/24/12 1946 06/24/12 2210 06/25/12 0010 06/25/12 0536  BP:  116/55 145/45 136/47  Pulse:  68 70 74  Temp: 97.3 F (36.3 C)  97.6 F (36.4 C) 98.4 F (36.9 C)  TempSrc: Oral  Oral Oral  Resp:   17 17  Height:      Weight:   299 lb 13.2 oz (136 kg)   SpO2: 96%  95% 95%   General:  No distress Lungs:  Clear Heart:  RRR Abdomen:  Positive bowel sounds, no rebound no guarding Extremities: Right wrist without bruising or bleeding.  LABS: Lab Results  Component Value Date   TROPONINI 0.38* 06/24/2012   Results for orders placed during the hospital encounter of 06/23/12 (from the past 24 hour(s))  TROPONIN I     Status: Abnormal   Collection Time    06/24/12  8:43 AM      Result Value Range   Troponin I 0.38 (*) <0.30 ng/mL  HEPARIN LEVEL (UNFRACTIONATED)     Status: None   Collection Time    06/24/12  8:46 AM      Result Value Range   Heparin Unfractionated 0.47  0.30 - 0.70 IU/mL  GLUCOSE, CAPILLARY     Status: Abnormal   Collection Time    06/24/12 12:26 PM      Result Value Range   Glucose-Capillary 122 (*) 70 - 99 mg/dL   Comment 1 Documented in Chart     Comment 2 Notify RN    POCT ACTIVATED CLOTTING TIME     Status: None   Collection Time    06/24/12  2:33 PM      Result Value Range   Activated Clotting Time 524    GLUCOSE, CAPILLARY     Status: Abnormal   Collection Time    06/24/12  4:07 PM      Result Value Range   Glucose-Capillary 139 (*) 70 - 99 mg/dL  GLUCOSE, CAPILLARY     Status: Abnormal   Collection Time    06/24/12 10:04 PM      Result Value Range   Glucose-Capillary 171 (*) 70 - 99 mg/dL   Comment 1 Notify RN     Comment 2 Documented in Chart    CBC     Status: Abnormal   Collection Time    06/25/12  6:11 AM      Result Value Range   WBC 8.5  4.0 - 10.5 K/uL   RBC 3.59 (*) 3.87 - 5.11 MIL/uL   Hemoglobin 11.1 (*) 12.0 - 15.0 g/dL   HCT 91.4 (*) 78.2 - 95.6 %   MCV 91.4  78.0 - 100.0 fL   MCH 30.9  26.0 - 34.0 pg   MCHC 33.8  30.0 - 36.0 g/dL   RDW 21.3  08.6 - 57.8 %   Platelets 319  150 - 400 K/uL    Intake/Output Summary (Last 24 hours) at 06/25/12 0654 Last data filed at 06/25/12 0530  Gross per 24 hour  Intake   1480 ml  Output   2400 ml  Net   -920 ml    ASSESSMENT AND PLAN:  CAD:    PCI with DES to prox/mid circumflex and DES to LAD proximal.  EF 60%.  She needs aggressive risk reduction.    Diabetes:   A1C is not at target.   She can resume metformin after 48 hours.  She needs to follow with primary MD  Obesity:  Educated.   She did not want a dietary consult. We discussed diet and exercise.  HTN:  I will titrate ACE inhibitor of BP remains elevated.   Mild anemia:  Follow up with primary MD as an outpatient.    Fayrene Fearing [facility] 06/25/2012 6:54 AM

## 2012-07-02 ENCOUNTER — Encounter: Payer: Self-pay | Admitting: *Deleted

## 2012-07-02 ENCOUNTER — Ambulatory Visit (INDEPENDENT_AMBULATORY_CARE_PROVIDER_SITE_OTHER): Payer: Self-pay | Admitting: Adult Health

## 2012-07-02 ENCOUNTER — Encounter: Payer: Self-pay | Admitting: Adult Health

## 2012-07-02 VITALS — BP 144/76 | HR 69 | Ht 66.0 in | Wt 297.0 lb

## 2012-07-02 DIAGNOSIS — I2510 Atherosclerotic heart disease of native coronary artery without angina pectoris: Secondary | ICD-10-CM

## 2012-07-02 DIAGNOSIS — I10 Essential (primary) hypertension: Secondary | ICD-10-CM

## 2012-07-02 DIAGNOSIS — E785 Hyperlipidemia, unspecified: Secondary | ICD-10-CM

## 2012-07-02 NOTE — Patient Instructions (Addendum)
Your physician recommends that you schedule a follow-up appointment in: 3 months  A referral has been made to Cardiac Rehab at [facility].  Please call [facility] at [phone] if you dont receive a call by early next week.  Your physician recommends that you return for lab work in: Next Monday at [place] [facility] across the street from our office on the 2nd floor.  Your physician recommends that you return for lab work in: Cholesterol in 3 months, prior to follow up appointment in 3 months (you will receive a reminder letter)

## 2012-07-02 NOTE — Progress Notes (Signed)
HPI: Sandra Schneider is a friendly 57 y/o patient of Dr.Rothbart we are seeing to be established in the [place] office post hospitalization after NSTEMI. She had cardiac cath on 06/24/12 revealing 80% prox, 30% mid LAD lesions, 99% mid and 40% mid, 70% distal L Cx. With PCI to the prox LAD, and DES to mid Cx.placed on DAPT with Effient and ASA.Placed on statin.  She was also found to be hypertensive with medication adjustments and uncontrolled diabetes with Hgb A1C 8.0%. She is followed by "Sandra Schneider" at the [facility] dept for PCP management.    She has been without recurrent chest discomfort, palpitations or DOE which lead her to seek treatment originally. 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   No Known Allergies  Current Outpatient Prescriptions  Medication Sig Dispense Refill  . aspirin EC 81 MG tablet Take 81 mg by mouth daily.      Marland Kitchen atorvastatin (LIPITOR) 80 MG tablet Take 1 tablet (80 mg total) by mouth daily at 6 PM.  30 tablet  3  . bimatoprost (LUMIGAN) 0.01 % SOLN Place 1 drop into both eyes at bedtime.      . famotidine (PEPCID) 40 MG tablet Take 1 tablet (40 mg total) by mouth daily.  20 tablet  0  . glimepiride (AMARYL) 4 MG tablet Take 4 mg by mouth daily.      Marland Kitchen lisinopril-hydrochlorothiazide (PRINZIDE,ZESTORETIC) 20-25 MG per tablet Take 1 tablet by mouth daily.      . metFORMIN (GLUCOPHAGE) 1000 MG tablet Take 1 tablet (1,000 mg total) by mouth 2 (two) times daily with a meal.      . metoprolol tartrate (LOPRESSOR) 25 MG tablet Take 1 tablet (25 mg total) by mouth 2 (two) times daily.  60 tablet  3  . nitroGLYCERIN (NITROSTAT) 0.4 MG SL tablet Place 1 tablet (0.4 mg total) under the tongue every 5 (five) minutes x 3 doses as needed for chest pain.  25 tablet  3  . prasugrel (EFFIENT) 10 MG TABS Take 1 tablet (10 mg total) by mouth daily.  30 tablet  3  . Prenatal Vit-Fe Fumarate-FA  (PRENATAL MULTIVITAMIN) TABS Take 1 tablet by mouth daily.       No current facility-administered medications for this visit.    Past Medical History  Diagnosis Date  . Hypertension   . Diabetes mellitus   . GERD (gastroesophageal reflux disease)     Past Surgical History  Procedure Laterality Date  . Cesarean section      ZOX:WRUEAV of systems complete and found to be negative unless listed above  PHYSICAL EXAM BP 144/76  Pulse 69  Ht 5\' 6"  (1.676 m)  Wt 297 lb (134.718 kg)  BMI 47.96 kg/m2  LMP 01/14/2011  General: Well developed, well nourished, obese, in no acute distress Head: Eyes PERRLA, No xanthomas.   Normal cephalic and atramatic  Lungs: Clear bilaterally to auscultation and percussion. Heart: HRRR S1 S2, without MRG.  Pulses are 2+ & equal.            No carotid bruit. No JVD.  No abdominal bruits. No femoral bruits. Abdomen: Bowel sounds are positive, abdomen soft and non-tender without masses or                  Hernia's noted. Msk:  Back normal, normal gait. Normal strength and tone for age. Extremities: No clubbing, cyanosis or  edema.  DP +1 Neuro: Alert and oriented X 3. Psych:  Good affect, responds appropriately  EKG: NSR with poor R-wave progression.  ASSESSMENT AND PLAN

## 2012-07-02 NOTE — Assessment & Plan Note (Signed)
She is now placed on Crestor 10 mg daily post hospitalization. Goal of cholesterol 170 and LDL 70 for diabetic patient with CAD. Follow up labs in 3 months.

## 2012-07-02 NOTE — Assessment & Plan Note (Signed)
Weight loss and increased activity is recommended. Cardiac rehab will assist in motivation to do so.

## 2012-07-02 NOTE — Assessment & Plan Note (Addendum)
She is without cardiac complaint but remains tired at times. She will be referred to cardiac rehab at [facility] for increased exercise, education and ongoing assessment of functional capacity. She is willing to commit to 12 week program.\  I have provided a return to work letter so that she can start back this week. Her job is basically a sedentary position with very little exertion.   She will continue on DAPT for one year and metoprolol, I will check BMET post cath back on metformin regimen. Follow up in 3 months.

## 2012-07-02 NOTE — Assessment & Plan Note (Signed)
Mildly elevated today, but normotensive while in the hospital will not make adjustments at this time. She is educated on optimal BP levels of 120-130 systolic for diabetic patient. She will continue on lisinopril/HCTZ 20/25 mg daily. BMET is ordered.

## 2012-07-02 NOTE — Progress Notes (Deleted)
Name: Sandra Schneider    DOB: [birth year]  Age: 57 y.o.  MR#: [id number]       PCP:  Default, Provider, MD      Insurance: Payor: MEDICAID POTENTIAL  Plan: MEDICAID POTENTIAL  Product Type: *No Product type*    CC:   No chief complaint on file.   VS Filed Vitals:   07/02/12 1337  BP: 144/76  Pulse: 69  Height: 5\' 6"  (1.676 m)  Weight: 297 lb (134.718 kg)    Weights Current Weight  07/02/12 297 lb (134.718 kg)  06/25/12 299 lb 13.2 oz (136 kg)  06/25/12 299 lb 13.2 oz (136 kg)    Blood Pressure  BP Readings from Last 3 Encounters:  07/02/12 144/76  06/25/12 119/50  06/25/12 119/50     Admit date:  (Not on file) Last encounter with RMR:  Visit date not found   Allergy Review of patient's allergies indicates no known allergies.  Current Outpatient Prescriptions  Medication Sig Dispense Refill  . aspirin EC 81 MG tablet Take 81 mg by mouth daily.      Marland Kitchen atorvastatin (LIPITOR) 80 MG tablet Take 1 tablet (80 mg total) by mouth daily at 6 PM.  30 tablet  3  . bimatoprost (LUMIGAN) 0.01 % SOLN Place 1 drop into both eyes at bedtime.      . famotidine (PEPCID) 40 MG tablet Take 1 tablet (40 mg total) by mouth daily.  20 tablet  0  . glimepiride (AMARYL) 4 MG tablet Take 4 mg by mouth daily.      Marland Kitchen lisinopril-hydrochlorothiazide (PRINZIDE,ZESTORETIC) 20-25 MG per tablet Take 1 tablet by mouth daily.      . metFORMIN (GLUCOPHAGE) 1000 MG tablet Take 1 tablet (1,000 mg total) by mouth 2 (two) times daily with a meal.      . metoprolol tartrate (LOPRESSOR) 25 MG tablet Take 1 tablet (25 mg total) by mouth 2 (two) times daily.  60 tablet  3  . nitroGLYCERIN (NITROSTAT) 0.4 MG SL tablet Place 1 tablet (0.4 mg total) under the tongue every 5 (five) minutes x 3 doses as needed for chest pain.  25 tablet  3  . prasugrel (EFFIENT) 10 MG TABS Take 1 tablet (10 mg total) by mouth daily.  30 tablet  3  . Prenatal Vit-Fe Fumarate-FA (PRENATAL MULTIVITAMIN) TABS Take 1 tablet by mouth daily.        No current facility-administered medications for this visit.    Discontinued Meds:   There are no discontinued medications.  Patient Active Problem List  Diagnosis  . NSTEMI (non-ST elevated myocardial infarction)  . CAD in native artery  . Essential hypertension  . Uncontrolled diabetes mellitus  . Hyperlipidemia  . Normocytic anemia  . Morbid obesity    LABS @BMET3 @  @CMPRESULT3 @ @CBC3 @  Lipid Panel     Component Value Date/Time   CHOL 156 06/24/2012 0102   TRIG 135 06/24/2012 0102   HDL 37* 06/24/2012 0102   CHOLHDL 4.2 06/24/2012 0102   VLDL 27 06/24/2012 0102   LDLCALC 92 06/24/2012 0102    ABG No results found for this basename: phart, pco2, pco2art, po2, po2art, hco3, tco2, acidbasedef, o2sat     BNP (last 3 results)  Recent Labs  06/23/12 2031  PROBNP 173.3*   Cardiac Panel (last 3 results) No results found for this basename: CKTOTAL, CKMB, TROPONINI, RELINDX,  in the last 72 hours  Iron/TIBC/Ferritin No results found for this basename: iron, tibc, ferritin  EKG Orders placed in visit on 07/02/12  . EKG 12-LEAD     Prior Assessment and Plan Problem List as of 07/02/2012     ICD-9-CM     Cardiology Problems   NSTEMI (non-ST elevated myocardial infarction)   CAD in native artery   Essential hypertension   Hyperlipidemia     Other   Uncontrolled diabetes mellitus   Normocytic anemia   Morbid obesity       Imaging: No results found.

## 2012-07-03 ENCOUNTER — Other Ambulatory Visit: Payer: Self-pay | Admitting: *Deleted

## 2012-07-03 DIAGNOSIS — I2510 Atherosclerotic heart disease of native coronary artery without angina pectoris: Secondary | ICD-10-CM

## 2012-07-12 ENCOUNTER — Encounter: Payer: Self-pay | Admitting: *Deleted

## 2012-07-18 ENCOUNTER — Encounter: Payer: Self-pay | Admitting: *Deleted

## 2012-07-18 LAB — BASIC METABOLIC PANEL
BUN: 18 mg/dL (ref 6–23)
CO2: 28 mEq/L (ref 19–32)
Calcium: 9.8 mg/dL (ref 8.4–10.5)
Chloride: 101 mEq/L (ref 96–112)
Creat: 1 mg/dL (ref 0.50–1.10)
Glucose, Bld: 91 mg/dL (ref 70–99)

## 2012-07-30 ENCOUNTER — Encounter (HOSPITAL_COMMUNITY): Payer: MEDICAID

## 2012-08-13 ENCOUNTER — Encounter (HOSPITAL_COMMUNITY): Payer: Self-pay

## 2012-08-13 ENCOUNTER — Encounter (HOSPITAL_COMMUNITY)
Admission: RE | Admit: 2012-08-13 | Discharge: 2012-08-13 | Disposition: A | Discharge status: Home / Self Care | Payer: BC Managed Care – PPO | Source: Ambulatory Visit | Attending: Cardiology | Admitting: Cardiology

## 2012-08-13 VITALS — BP 128/70 | HR 80 | Ht 66.0 in | Wt 293.0 lb

## 2012-08-13 DIAGNOSIS — Z9861 Coronary angioplasty status: Secondary | ICD-10-CM

## 2012-08-13 DIAGNOSIS — Z5189 Encounter for other specified aftercare: Secondary | ICD-10-CM | POA: Insufficient documentation

## 2012-08-13 DIAGNOSIS — I219 Acute myocardial infarction, unspecified: Secondary | ICD-10-CM

## 2012-08-13 DIAGNOSIS — I252 Old myocardial infarction: Secondary | ICD-10-CM | POA: Insufficient documentation

## 2012-08-13 HISTORY — DX: Atherosclerotic heart disease of native coronary artery without angina pectoris: I25.10

## 2012-08-13 HISTORY — DX: Infective myocarditis: I40.0

## 2012-08-13 HISTORY — DX: Acute myocardial infarction, unspecified: I21.9

## 2012-08-13 NOTE — Patient Instructions (Signed)
Pt has finished orientation and is scheduled to start CR on ________ at _______. Pt has been instructed to arrive to class 15 minutes early for scheduled class. Pt has been instructed to wear comfortable clothing and shoes with rubber soles. Pt has been told to take their medications 1 hour prior to coming to class.  If the patient is not going to attend class, he/she has been instructed to call.   

## 2012-08-13 NOTE — Progress Notes (Addendum)
Patient referred to Cardiac Rehab due to Myocardial Infarction 410.90 and Stent Placement V45.82. During orientation advised patient on arrival and appointment times what to wear, what to do before, during and after exercise. Reviewed attendance and class policy. Talked about inclement weather and class consultation policy. Pt is scheduled to start Cardiac Rehab on 08/21/12 at 8:15.  Pt was advised to come to class 5 minutes before class starts. He was also given instructions on meeting with the dietician and attending the Family Structure classes. Pt is eager to get started. Patient completed the pre walk test on 08/21/12.

## 2012-08-21 ENCOUNTER — Encounter (HOSPITAL_COMMUNITY)
Admission: RE | Admit: 2012-08-21 | Discharge: 2012-08-21 | Disposition: A | Discharge status: Home / Self Care | Payer: BC Managed Care – PPO | Source: Ambulatory Visit | Attending: Cardiology | Admitting: Cardiology

## 2012-08-23 ENCOUNTER — Encounter (HOSPITAL_COMMUNITY)
Admission: RE | Admit: 2012-08-23 | Discharge: 2012-08-23 | Disposition: A | Discharge status: Home / Self Care | Payer: BC Managed Care – PPO | Source: Ambulatory Visit | Attending: Cardiology | Admitting: Cardiology

## 2012-08-26 ENCOUNTER — Encounter (HOSPITAL_COMMUNITY)
Admission: RE | Admit: 2012-08-26 | Discharge: 2012-08-26 | Disposition: A | Discharge status: Home / Self Care | Payer: BC Managed Care – PPO | Source: Ambulatory Visit | Attending: Cardiology | Admitting: Cardiology

## 2012-08-28 ENCOUNTER — Encounter (HOSPITAL_COMMUNITY)
Admission: RE | Admit: 2012-08-28 | Discharge: 2012-08-28 | Disposition: A | Discharge status: Home / Self Care | Payer: BC Managed Care – PPO | Source: Ambulatory Visit | Attending: Cardiology | Admitting: Cardiology

## 2012-08-30 ENCOUNTER — Encounter (HOSPITAL_COMMUNITY)
Admission: RE | Admit: 2012-08-30 | Discharge: 2012-08-30 | Disposition: A | Discharge status: Home / Self Care | Payer: BC Managed Care – PPO | Source: Ambulatory Visit | Attending: Cardiology | Admitting: Cardiology

## 2012-09-02 ENCOUNTER — Encounter (HOSPITAL_COMMUNITY)
Admission: RE | Admit: 2012-09-02 | Discharge: 2012-09-02 | Disposition: A | Discharge status: Home / Self Care | Payer: BC Managed Care – PPO | Source: Ambulatory Visit | Attending: Cardiology | Admitting: Cardiology

## 2012-09-04 ENCOUNTER — Encounter (HOSPITAL_COMMUNITY)
Admission: RE | Admit: 2012-09-04 | Discharge: 2012-09-04 | Disposition: A | Discharge status: Home / Self Care | Payer: BC Managed Care – PPO | Source: Ambulatory Visit | Attending: Cardiology | Admitting: Cardiology

## 2012-09-06 ENCOUNTER — Encounter (HOSPITAL_COMMUNITY)
Admission: RE | Admit: 2012-09-06 | Discharge: 2012-09-06 | Disposition: A | Discharge status: Home / Self Care | Payer: BC Managed Care – PPO | Source: Ambulatory Visit | Attending: Cardiology | Admitting: Cardiology

## 2012-09-09 ENCOUNTER — Encounter (HOSPITAL_COMMUNITY)
Admission: RE | Admit: 2012-09-09 | Discharge: 2012-09-09 | Disposition: A | Discharge status: Home / Self Care | Payer: BC Managed Care – PPO | Source: Ambulatory Visit | Attending: Cardiology | Admitting: Cardiology

## 2012-09-11 ENCOUNTER — Encounter (HOSPITAL_COMMUNITY)
Admission: RE | Admit: 2012-09-11 | Discharge: 2012-09-11 | Disposition: A | Discharge status: Home / Self Care | Payer: BC Managed Care – PPO | Source: Ambulatory Visit | Attending: Cardiology | Admitting: Cardiology

## 2012-09-13 ENCOUNTER — Encounter (HOSPITAL_COMMUNITY)
Admission: RE | Admit: 2012-09-13 | Discharge: 2012-09-13 | Disposition: A | Discharge status: Home / Self Care | Payer: BC Managed Care – PPO | Source: Ambulatory Visit | Attending: Cardiology | Admitting: Cardiology

## 2012-09-13 DIAGNOSIS — I252 Old myocardial infarction: Secondary | ICD-10-CM | POA: Insufficient documentation

## 2012-09-13 DIAGNOSIS — Z5189 Encounter for other specified aftercare: Secondary | ICD-10-CM | POA: Insufficient documentation

## 2012-09-13 DIAGNOSIS — Z9861 Coronary angioplasty status: Secondary | ICD-10-CM | POA: Insufficient documentation

## 2012-09-13 NOTE — Progress Notes (Signed)
Cardiac Rehabilitation Program Outcomes Report   Orientation:  08/13/2012 !st week report; 08/26/2012 Graduate Date:  tbd Discharge Date:  tbd # of sessions completed: 3 DX: MI and Stent X 2  Cardiologist: Dietrich Pates Family MD:  Alyson Locket, PA Class Time:  08:15  A.  Exercise Program:  Tolerates exercise @ 3.93 METS for 15 minutes and Walk Test Results:  Pre: Pre walk Test HR 76, BP 130/70: O2 98, RPE 6 and RPD 7, 6 min HR 88, BP 160/70, O2 98 , RPE 11 and RPD 9, Post HR 72, BP 120/70, O2 100, RPE 6 and RPD 7, Walked 1200 ft at 2.3 mph. 2.7 METS.  B.  Mental Health:  Good mental attitude  C.  Education/Instruction/Skills  Accurately checks own pulse.  Rest:  62  Exercise:  106, Knows THR for exercise and Uses Perceived Exertion Scale and/or Dyspnea Scale  Uses Perceived Exertion Scale and/or Dyspnea Scale  D.  Nutrition/Weight Control/Body Composition:  Adherence to prescribed nutrition program: good    E.  Blood Lipids    Lab Results  Component Value Date   CHOL 156 06/24/2012   HDL 37* 06/24/2012   LDLCALC 92 06/24/2012   TRIG 135 06/24/2012   CHOLHDL 4.2 06/24/2012    F.  Lifestyle Changes:  Making positive lifestyle changes  G.  Symptoms noted with exercise:  Asymptomatic  Report Completed By:  Lelon Huh. Harol Shabazz RN     Comments:  This is patients 1st week. Report. She is doing very well. Her resting HR was 62 and her resting BP is 150/60., Her peak HR was  106 and peak BP was 150/80. A report will follow upon her 18th visit, her Halfway points.

## 2012-09-16 ENCOUNTER — Encounter (HOSPITAL_COMMUNITY)
Admission: RE | Admit: 2012-09-16 | Discharge: 2012-09-16 | Disposition: A | Discharge status: Home / Self Care | Payer: BC Managed Care – PPO | Source: Ambulatory Visit | Attending: Cardiology | Admitting: Cardiology

## 2012-09-16 ENCOUNTER — Encounter: Payer: Self-pay | Admitting: Cardiology

## 2012-09-18 ENCOUNTER — Encounter (HOSPITAL_COMMUNITY)
Admission: RE | Admit: 2012-09-18 | Discharge: 2012-09-18 | Disposition: A | Discharge status: Home / Self Care | Payer: BC Managed Care – PPO | Source: Ambulatory Visit | Attending: Cardiology | Admitting: Cardiology

## 2012-09-20 ENCOUNTER — Encounter (HOSPITAL_COMMUNITY)
Admission: RE | Admit: 2012-09-20 | Discharge: 2012-09-20 | Disposition: A | Discharge status: Home / Self Care | Payer: BC Managed Care – PPO | Source: Ambulatory Visit | Attending: Cardiology | Admitting: Cardiology

## 2012-09-23 ENCOUNTER — Encounter (HOSPITAL_COMMUNITY)
Admission: RE | Admit: 2012-09-23 | Discharge: 2012-09-23 | Disposition: A | Discharge status: Home / Self Care | Payer: BC Managed Care – PPO | Source: Ambulatory Visit | Attending: Cardiology | Admitting: Cardiology

## 2012-09-25 ENCOUNTER — Encounter (HOSPITAL_COMMUNITY)
Admission: RE | Admit: 2012-09-25 | Discharge: 2012-09-25 | Disposition: A | Discharge status: Home / Self Care | Payer: BC Managed Care – PPO | Source: Ambulatory Visit | Attending: Cardiology | Admitting: Cardiology

## 2012-09-27 ENCOUNTER — Encounter (HOSPITAL_COMMUNITY): Payer: BC Managed Care – PPO

## 2012-09-30 ENCOUNTER — Encounter (HOSPITAL_COMMUNITY)
Admission: RE | Admit: 2012-09-30 | Discharge: 2012-09-30 | Disposition: A | Discharge status: Home / Self Care | Payer: BC Managed Care – PPO | Source: Ambulatory Visit | Attending: Cardiology | Admitting: Cardiology

## 2012-10-02 ENCOUNTER — Encounter (HOSPITAL_COMMUNITY)
Admission: RE | Admit: 2012-10-02 | Discharge: 2012-10-02 | Disposition: A | Discharge status: Home / Self Care | Payer: BC Managed Care – PPO | Source: Ambulatory Visit | Attending: Cardiology | Admitting: Cardiology

## 2012-10-04 ENCOUNTER — Encounter (HOSPITAL_COMMUNITY)
Admission: RE | Admit: 2012-10-04 | Discharge: 2012-10-04 | Disposition: A | Discharge status: Home / Self Care | Payer: BC Managed Care – PPO | Source: Ambulatory Visit | Attending: Cardiology | Admitting: Cardiology

## 2012-10-07 ENCOUNTER — Encounter (HOSPITAL_COMMUNITY): Payer: BC Managed Care – PPO

## 2012-10-09 ENCOUNTER — Encounter (HOSPITAL_COMMUNITY)
Admission: RE | Admit: 2012-10-09 | Discharge: 2012-10-09 | Disposition: A | Discharge status: Home / Self Care | Payer: BC Managed Care – PPO | Source: Ambulatory Visit | Attending: Cardiology | Admitting: Cardiology

## 2012-10-11 ENCOUNTER — Encounter (HOSPITAL_COMMUNITY)
Admission: RE | Admit: 2012-10-11 | Discharge: 2012-10-11 | Disposition: A | Discharge status: Home / Self Care | Payer: BC Managed Care – PPO | Source: Ambulatory Visit | Attending: Cardiology | Admitting: Cardiology

## 2012-10-14 ENCOUNTER — Encounter (HOSPITAL_COMMUNITY)
Admission: RE | Admit: 2012-10-14 | Discharge: 2012-10-14 | Disposition: A | Discharge status: Home / Self Care | Payer: BC Managed Care – PPO | Source: Ambulatory Visit | Attending: Cardiology | Admitting: Cardiology

## 2012-10-14 DIAGNOSIS — Z9861 Coronary angioplasty status: Secondary | ICD-10-CM | POA: Insufficient documentation

## 2012-10-14 DIAGNOSIS — Z5189 Encounter for other specified aftercare: Secondary | ICD-10-CM | POA: Insufficient documentation

## 2012-10-14 DIAGNOSIS — I252 Old myocardial infarction: Secondary | ICD-10-CM | POA: Insufficient documentation

## 2012-10-16 ENCOUNTER — Encounter (HOSPITAL_COMMUNITY)
Admission: RE | Admit: 2012-10-16 | Discharge: 2012-10-16 | Disposition: A | Discharge status: Home / Self Care | Payer: BC Managed Care – PPO | Source: Ambulatory Visit | Attending: Cardiology | Admitting: Cardiology

## 2012-10-18 ENCOUNTER — Encounter (HOSPITAL_COMMUNITY): Payer: BC Managed Care – PPO

## 2012-10-21 ENCOUNTER — Encounter (HOSPITAL_COMMUNITY)
Admission: RE | Admit: 2012-10-21 | Discharge: 2012-10-21 | Disposition: A | Discharge status: Home / Self Care | Payer: BC Managed Care – PPO | Source: Ambulatory Visit | Attending: Cardiology | Admitting: Cardiology

## 2012-10-23 ENCOUNTER — Encounter (HOSPITAL_COMMUNITY)
Admission: RE | Admit: 2012-10-23 | Discharge: 2012-10-23 | Disposition: A | Discharge status: Home / Self Care | Payer: BC Managed Care – PPO | Source: Ambulatory Visit | Attending: Cardiology | Admitting: Cardiology

## 2012-10-25 ENCOUNTER — Encounter (HOSPITAL_COMMUNITY)
Admission: RE | Admit: 2012-10-25 | Discharge: 2012-10-25 | Disposition: A | Discharge status: Home / Self Care | Payer: BC Managed Care – PPO | Source: Ambulatory Visit | Attending: Cardiology | Admitting: Cardiology

## 2012-10-28 ENCOUNTER — Encounter (HOSPITAL_COMMUNITY)
Admission: RE | Admit: 2012-10-28 | Discharge: 2012-10-28 | Disposition: A | Discharge status: Home / Self Care | Payer: BC Managed Care – PPO | Source: Ambulatory Visit | Attending: Cardiology | Admitting: Cardiology

## 2012-10-30 ENCOUNTER — Encounter (HOSPITAL_COMMUNITY): Payer: BC Managed Care – PPO

## 2012-11-01 ENCOUNTER — Encounter (HOSPITAL_COMMUNITY)
Admission: RE | Admit: 2012-11-01 | Discharge: 2012-11-01 | Disposition: A | Discharge status: Home / Self Care | Payer: BC Managed Care – PPO | Source: Ambulatory Visit | Attending: Cardiology | Admitting: Cardiology

## 2012-11-04 ENCOUNTER — Encounter (HOSPITAL_COMMUNITY): Payer: BC Managed Care – PPO

## 2012-11-06 ENCOUNTER — Encounter (HOSPITAL_COMMUNITY)
Admission: RE | Admit: 2012-11-06 | Discharge: 2012-11-06 | Disposition: A | Discharge status: Home / Self Care | Payer: BC Managed Care – PPO | Source: Ambulatory Visit | Attending: Cardiology | Admitting: Cardiology

## 2012-11-08 ENCOUNTER — Encounter (HOSPITAL_COMMUNITY)
Admission: RE | Admit: 2012-11-08 | Discharge: 2012-11-08 | Disposition: A | Discharge status: Home / Self Care | Payer: BC Managed Care – PPO | Source: Ambulatory Visit | Attending: Cardiology | Admitting: Cardiology

## 2012-11-08 ENCOUNTER — Other Ambulatory Visit: Payer: Self-pay

## 2012-11-08 MED ORDER — METOPROLOL TARTRATE 25 MG PO TABS: 25.0000 mg | ORAL_TABLET | Freq: Two times a day (BID) | ORAL | Status: DC

## 2012-11-11 ENCOUNTER — Other Ambulatory Visit: Payer: Self-pay | Admitting: *Deleted

## 2012-11-11 ENCOUNTER — Encounter (HOSPITAL_COMMUNITY)
Admission: RE | Admit: 2012-11-11 | Discharge: 2012-11-11 | Disposition: A | Discharge status: Home / Self Care | Payer: BC Managed Care – PPO | Source: Ambulatory Visit | Attending: Cardiology | Admitting: Cardiology

## 2012-11-11 MED ORDER — ATORVASTATIN CALCIUM 80 MG PO TABS: 80.0000 mg | ORAL_TABLET | Freq: Every day | ORAL | Status: DC

## 2012-11-11 NOTE — Telephone Encounter (Signed)
Error

## 2012-11-13 ENCOUNTER — Encounter (HOSPITAL_COMMUNITY)
Admission: RE | Admit: 2012-11-13 | Discharge: 2012-11-13 | Disposition: A | Discharge status: Home / Self Care | Payer: BC Managed Care – PPO | Source: Ambulatory Visit | Attending: Cardiology | Admitting: Cardiology

## 2012-11-13 DIAGNOSIS — Z5189 Encounter for other specified aftercare: Secondary | ICD-10-CM | POA: Insufficient documentation

## 2012-11-13 DIAGNOSIS — Z9861 Coronary angioplasty status: Secondary | ICD-10-CM | POA: Insufficient documentation

## 2012-11-13 DIAGNOSIS — I252 Old myocardial infarction: Secondary | ICD-10-CM | POA: Insufficient documentation

## 2012-11-15 ENCOUNTER — Encounter (HOSPITAL_COMMUNITY): Payer: BC Managed Care – PPO

## 2012-11-18 ENCOUNTER — Encounter (HOSPITAL_COMMUNITY)
Admission: RE | Admit: 2012-11-18 | Discharge: 2012-11-18 | Disposition: A | Discharge status: Home / Self Care | Payer: BC Managed Care – PPO | Source: Ambulatory Visit | Attending: Cardiology | Admitting: Cardiology

## 2012-11-20 ENCOUNTER — Encounter (HOSPITAL_COMMUNITY): Payer: BC Managed Care – PPO

## 2012-11-20 ENCOUNTER — Encounter (HOSPITAL_COMMUNITY)
Admission: RE | Admit: 2012-11-20 | Discharge: 2012-11-20 | Disposition: A | Discharge status: Home / Self Care | Payer: BC Managed Care – PPO | Source: Ambulatory Visit | Attending: Cardiology | Admitting: Cardiology

## 2012-11-22 ENCOUNTER — Encounter (HOSPITAL_COMMUNITY)
Admission: RE | Admit: 2012-11-22 | Discharge: 2012-11-22 | Disposition: A | Discharge status: Home / Self Care | Payer: BC Managed Care – PPO | Source: Ambulatory Visit | Attending: Cardiology | Admitting: Cardiology

## 2012-11-27 ENCOUNTER — Encounter (HOSPITAL_COMMUNITY): Payer: BC Managed Care – PPO

## 2012-11-29 ENCOUNTER — Encounter (HOSPITAL_COMMUNITY): Payer: BC Managed Care – PPO

## 2012-12-02 ENCOUNTER — Encounter (HOSPITAL_COMMUNITY): Payer: BC Managed Care – PPO

## 2012-12-04 ENCOUNTER — Encounter (HOSPITAL_COMMUNITY)
Admission: RE | Admit: 2012-12-04 | Discharge: 2012-12-04 | Disposition: A | Discharge status: Home / Self Care | Payer: BC Managed Care – PPO | Source: Ambulatory Visit | Attending: Cardiology | Admitting: Cardiology

## 2012-12-06 ENCOUNTER — Encounter (HOSPITAL_COMMUNITY): Payer: BC Managed Care – PPO

## 2012-12-09 ENCOUNTER — Encounter (HOSPITAL_COMMUNITY): Payer: BC Managed Care – PPO

## 2013-03-25 NOTE — Addendum Note (Signed)
Encounter addended by: Angelica Pou, RN on: 03/25/2013  1:40 PM<BR>     Documentation filed: Clinical Notes

## 2013-03-25 NOTE — Progress Notes (Signed)
Cardiac Rehabilitation Program Outcomes Report   Orientation:  08/13/2012 Graduate Date:  12/04/2012 Discharge Date:  12/04/2012 # of sessions completed: 36 DX: Stent X 2  Cardiologist: Dietrich Pates Family MD:  Lorayne Marek MUse PA Class Time:  08:15  A.  Exercise Program:  Tolerates exercise @ 4.41 METS for 15  minutes, Walk Test Results:  Post: Post Walk Test: Resting HR 68, BP 110/80, O2 97%, RPE 7, and RPD 7. 6 min HR 106, BP 170/60,, O2 96%,RPE 11 and RPD 11, Post HR 73, BP 130/60, O2 99%, RPE 7 and RPD7. Walked 149ft at 2.7 mph at 3.1 METS. and Discharged to home exercise program.  Anticipated compliance:  excellent  B.  Mental Health:  Good mental attitude  C.  Education/Instruction/Skills  Accurately checks own pulse.  Rest:  73  Exercise:104, Knows THR for exercise, Uses Perceived Exertion Scale and/or Dyspnea Scale and Attended 13 education classes  Uses Perceived Exertion Scale and/or Dyspnea Scale  D.  Nutrition/Weight Control/Body Composition:  Adherence to prescribed nutrition program: good    E.  Blood Lipids    Lab Results  Component Value Date   CHOL 156 06/24/2012   HDL 37* 06/24/2012   LDLCALC 92 06/24/2012   TRIG 135 06/24/2012   CHOLHDL 4.2 06/24/2012    F.  Lifestyle Changes:  Making positive lifestyle changes  G.  Symptoms noted with exercise:  Asymptomatic  Report Completed By:  Lelon Huh. Jaymar Loeber RN   Comments:  This is patients graduation report.. She has done very well while in rehab. She achieved a peak METS of 4.41. Her resting Hr was 73 and resting BP was 110/80 , Her peak HR was 104 and BP was 130 60. Patient left early this day due to an emergency with her mother.

## 2013-03-25 NOTE — Addendum Note (Signed)
Encounter addended by: Angelica Pou, RN on: 03/25/2013  1:57 PM<BR>     Documentation filed: Notes Section

## 2013-03-25 NOTE — Progress Notes (Signed)
Cardiac Rehabilitation Program Outcomes Report   Orientation:  08/13/2012 Halfway Report: 10/02/2012 Graduate Date:  tbd Discharge Date:  tbd # of sessions completed: 18  Cardiologist: Donna Bernard MD:  Kizzie Furnish PA Class Time:  08:15  A.  Exercise Program:  Tolerates exercise @ 3.93 METS for 15 minutes  B.  Mental Health:  Good mental attitude  C.  Education/Instruction/Skills  Accurately checks own pulse.  Rest:  66  Exercise:  96, Knows THR for exercise and Uses Perceived Exertion Scale and/or Dyspnea Scale  Uses Perceived Exertion Scale and/or Dyspnea Scale  D.  Nutrition/Weight Control/Body Composition:  Adherence to prescribed nutrition program: good    E.  Blood Lipids    Lab Results  Component Value Date   CHOL 156 06/24/2012   HDL 37* 06/24/2012   LDLCALC 92 06/24/2012   TRIG 135 06/24/2012   CHOLHDL 4.2 06/24/2012    F.  Lifestyle Changes:  Making positive lifestyle changes  G.  Symptoms noted with exercise:  Asymptomatic  Report Completed By:  Lelon Huh. Joshuwa Vecchio RN   Comments:  This is patients Halfway report . She has done well while in rehab. She achieved a peak METS of 3.93. Her resting hr was 66 and resting BP was 120/60, Her peak HR was 96 and her peak BP was 122/70. A graduation report will follow upon her 36th visit.

## 2013-03-25 NOTE — Addendum Note (Signed)
Encounter addended by: Angelica Pou, RN on: 03/25/2013  1:38 PM<BR>     Documentation filed: Notes Section

## 2013-03-25 NOTE — Addendum Note (Signed)
Encounter addended by: Angelica Pou, RN on: 03/25/2013  1:58 PM<BR>     Documentation filed: Clinical Notes

## 2013-06-16 ENCOUNTER — Emergency Department (HOSPITAL_COMMUNITY): Payer: Self-pay

## 2013-06-16 ENCOUNTER — Encounter (HOSPITAL_COMMUNITY): Payer: Self-pay | Admitting: Emergency Medicine

## 2013-06-16 ENCOUNTER — Emergency Department (HOSPITAL_COMMUNITY)
Admission: EM | Admit: 2013-06-16 | Discharge: 2013-06-16 | Disposition: A | Discharge status: Home / Self Care | Payer: Self-pay | Attending: Emergency Medicine | Admitting: Emergency Medicine

## 2013-06-16 DIAGNOSIS — Z9861 Coronary angioplasty status: Secondary | ICD-10-CM | POA: Insufficient documentation

## 2013-06-16 DIAGNOSIS — H5789 Other specified disorders of eye and adnexa: Secondary | ICD-10-CM | POA: Insufficient documentation

## 2013-06-16 DIAGNOSIS — I252 Old myocardial infarction: Secondary | ICD-10-CM | POA: Insufficient documentation

## 2013-06-16 DIAGNOSIS — K219 Gastro-esophageal reflux disease without esophagitis: Secondary | ICD-10-CM | POA: Insufficient documentation

## 2013-06-16 DIAGNOSIS — Z79899 Other long term (current) drug therapy: Secondary | ICD-10-CM | POA: Insufficient documentation

## 2013-06-16 DIAGNOSIS — J40 Bronchitis, not specified as acute or chronic: Secondary | ICD-10-CM

## 2013-06-16 DIAGNOSIS — I10 Essential (primary) hypertension: Secondary | ICD-10-CM | POA: Insufficient documentation

## 2013-06-16 DIAGNOSIS — I2510 Atherosclerotic heart disease of native coronary artery without angina pectoris: Secondary | ICD-10-CM | POA: Insufficient documentation

## 2013-06-16 DIAGNOSIS — J209 Acute bronchitis, unspecified: Secondary | ICD-10-CM | POA: Insufficient documentation

## 2013-06-16 DIAGNOSIS — Z7982 Long term (current) use of aspirin: Secondary | ICD-10-CM | POA: Insufficient documentation

## 2013-06-16 DIAGNOSIS — E119 Type 2 diabetes mellitus without complications: Secondary | ICD-10-CM | POA: Insufficient documentation

## 2013-06-16 LAB — CBC WITH DIFFERENTIAL/PLATELET
Basophils Absolute: 0 10*3/uL (ref 0.0–0.1)
Basophils Relative: 0 % (ref 0–1)
EOS PCT: 1 % (ref 0–5)
Eosinophils Absolute: 0.1 10*3/uL (ref 0.0–0.7)
HEMATOCRIT: 29.7 % — AB (ref 36.0–46.0)
Hemoglobin: 10.1 g/dL — ABNORMAL LOW (ref 12.0–15.0)
LYMPHS ABS: 2.9 10*3/uL (ref 0.7–4.0)
LYMPHS PCT: 25 % (ref 12–46)
MCH: 31.9 pg (ref 26.0–34.0)
MCHC: 34 g/dL (ref 30.0–36.0)
MCV: 93.7 fL (ref 78.0–100.0)
MONO ABS: 0.9 10*3/uL (ref 0.1–1.0)
Monocytes Relative: 7 % (ref 3–12)
Neutro Abs: 7.7 10*3/uL (ref 1.7–7.7)
Neutrophils Relative %: 67 % (ref 43–77)
Platelets: 366 10*3/uL (ref 150–400)
RBC: 3.17 MIL/uL — AB (ref 3.87–5.11)
RDW: 12.5 % (ref 11.5–15.5)
WBC: 11.6 10*3/uL — AB (ref 4.0–10.5)

## 2013-06-16 LAB — GLUCOSE, CAPILLARY: GLUCOSE-CAPILLARY: 122 mg/dL — AB (ref 70–99)

## 2013-06-16 LAB — BASIC METABOLIC PANEL
BUN: 19 mg/dL (ref 6–23)
CALCIUM: 9.5 mg/dL (ref 8.4–10.5)
CO2: 28 meq/L (ref 19–32)
CREATININE: 0.87 mg/dL (ref 0.50–1.10)
Chloride: 96 mEq/L (ref 96–112)
GFR calc Af Amer: 83 mL/min — ABNORMAL LOW (ref >90)
GFR calc non Af Amer: 72 mL/min — ABNORMAL LOW (ref >90)
GLUCOSE: 125 mg/dL — AB (ref 70–99)
Potassium: 4.6 mEq/L (ref 3.7–5.3)
SODIUM: 136 meq/L — AB (ref 137–147)

## 2013-06-16 LAB — TROPONIN I: Troponin I: <0.3 ng/mL (ref <0.30)

## 2013-06-16 MED ORDER — DOXYCYCLINE HYCLATE 100 MG PO CAPS: 100.0000 mg | ORAL_CAPSULE | Freq: Two times a day (BID) | ORAL | Status: DC

## 2013-06-16 NOTE — ED Provider Notes (Signed)
CSN: [id number]     Arrival date & time 06/16/13  0810 History   First MD Initiated Contact with Patient 06/16/13 [phone]    This chart was scribed for Ezequiel Essex, MD by Era Bumpers, ED scribe. This patient was seen in room APA03/APA03 and the patient's care was started at 0822.  Chief Complaint  Patient presents with  . Cough   The history is provided by the patient. No language interpreter was used.   HPI Comments: Sandra Schneider is a 58 y.o. female who presents to the Emergency Department complaining of clear and yellow colored productive cough, rhinorrhea, and watery/itchy eyes, onset 1 week ago. She states does not normally have allergies but has been having redness/itching and watery eyes along w/her rhinorrhea, cough and nasal congestion; she states minimal blood blowing her nose. She denies associated fever, CP, SOB, abdominal pain, sore throat, myalgias, emesis episodes. She had the flu shot this year. She denies any blurry/double vision. She states has been eating/drinking normally recently. She denies back pain, abdominal pain.   She is not a smoker.  Past Medical History  Diagnosis Date  . Hypertension   . Diabetes mellitus   . GERD (gastroesophageal reflux disease)   . Coronary artery disease   . Myocardial abscess   . Myocardial infarct 06/24/2012   Past Surgical History  Procedure Laterality Date  . Cesarean section    . Cardiac catheterization    . Coronary stent placement N/A 06/24/2012   Family History  Problem Relation Age of Onset  . Heart disease Father   . Diabetes Other   . Hypertension Other   . Cancer Other    History  Substance Use Topics  . Smoking status: Never Smoker   . Smokeless tobacco: Never Used  . Alcohol Use: No   OB History   Grav Para Term Preterm Abortions TAB SAB Ect Mult Living   3 3 3       3      Review of Systems  All other systems reviewed and are negative.   A complete 10 system review of systems was obtained and all  systems are negative except as noted in the HPI and PMH.   Allergies  Review of patient's allergies indicates no known allergies.  Home Medications   Current Outpatient Rx  Name  Route  Sig  Dispense  Refill  . aspirin EC 81 MG tablet   Oral   Take 81 mg by mouth daily.         Marland Kitchen atorvastatin (LIPITOR) 80 MG tablet   Oral   Take 1 tablet (80 mg total) by mouth daily at 6 PM.   30 tablet   3     Patient needs to schedule follow up visit   . bimatoprost (LUMIGAN) 0.01 % SOLN   Both Eyes   Place 1 drop into both eyes at bedtime.         Marland Kitchen glimepiride (AMARYL) 4 MG tablet   Oral   Take 4 mg by mouth daily.         Marland Kitchen lisinopril-hydrochlorothiazide (PRINZIDE,ZESTORETIC) 20-25 MG per tablet   Oral   Take 1 tablet by mouth daily.         . metFORMIN (GLUCOPHAGE) 1000 MG tablet   Oral   Take 1 tablet (1,000 mg total) by mouth 2 (two) times daily with a meal.         . metoprolol tartrate (LOPRESSOR) 25 MG tablet   Oral  Take 1 tablet (25 mg total) by mouth 2 (two) times daily.   60 tablet   3   . nitroGLYCERIN (NITROSTAT) 0.4 MG SL tablet   Sublingual   Place 1 tablet (0.4 mg total) under the tongue every 5 (five) minutes x 3 doses as needed for chest pain.   25 tablet   3   . Prenatal Vit-Fe Fumarate-FA (PRENATAL MULTIVITAMIN) TABS   Oral   Take 1 tablet by mouth daily.         Marland Kitchen doxycycline (VIBRAMYCIN) 100 MG capsule   Oral   Take 1 capsule (100 mg total) by mouth 2 (two) times daily.   20 capsule   0   . EXPIRED: famotidine (PEPCID) 40 MG tablet   Oral   Take 1 tablet (40 mg total) by mouth daily.   20 tablet   0    Triage Vitals: BP 139/64  Pulse 92  Temp(Src) 99.6 F (37.6 C) (Oral)  Resp 20  Ht 5\' 6"  (1.676 m)  Wt 290 lb (131.543 kg)  BMI 46.83 kg/m2  SpO2 96%  LMP 01/14/2011 Physical Exam  Nursing note and vitals reviewed. Constitutional: She is oriented to person, place, and time. She appears well-developed and well-nourished.  No distress.  HENT:  Head: Normocephalic and atraumatic.  Upper airway congestion  Eyes: Conjunctivae and EOM are normal. Pupils are equal, round, and reactive to light. Right eye exhibits no discharge. Left eye exhibits no discharge.  conjunctiva injected bilaterally.   Neck: Normal range of motion.  Cardiovascular: Normal rate, regular rhythm and normal heart sounds.   No murmur heard. Pulmonary/Chest: Effort normal and breath sounds normal. No respiratory distress. She has no wheezes. She has no rales.  Abdominal: Soft. She exhibits no distension. There is no tenderness.  Musculoskeletal: Normal range of motion. She exhibits no edema.  Neurological: She is alert and oriented to person, place, and time.  Skin: Skin is warm and dry.  Psychiatric: She has a normal mood and affect. Thought content normal.    ED Course  Procedures (including critical care time) DIAGNOSTIC STUDIES: Oxygen Saturation is 96% on room air, adequate by my interpretation.    COORDINATION OF CARE: At 900 AM Discussed treatment plan with patient which includes CXR, EKG, blood work, cardiac enzymes. Patient agrees.   Labs Review Labs Reviewed  CBC WITH DIFFERENTIAL - Abnormal; Notable for the following:    WBC 11.6 (*)    RBC 3.17 (*)    Hemoglobin 10.1 (*)    HCT 29.7 (*)    All other components within normal limits  BASIC METABOLIC PANEL - Abnormal; Notable for the following:    Sodium 136 (*)    Glucose, Bld 125 (*)    GFR calc non Af Amer 72 (*)    GFR calc Af Amer 83 (*)    All other components within normal limits  GLUCOSE, CAPILLARY - Abnormal; Notable for the following:    Glucose-Capillary 122 (*)    All other components within normal limits  TROPONIN I   Imaging Review Dg Chest 2 View  06/16/2013   CLINICAL DATA:  Productive cough.  EXAM: CHEST  2 VIEW  COMPARISON:  06/23/2012  FINDINGS: The heart size and mediastinal contours are within normal limits. Both lungs are clear. The bony thorax  is intact.  No change from the prior study.  IMPRESSION: No active cardiopulmonary disease.   Electronically Signed   By: Lajean Manes M.D.   On: 06/16/2013  09:25    EKG Interpretation    Date/Time:  Monday June 16 2013 08:42:57 EST Ventricular Rate:  80 PR Interval:  176 QRS Duration: 84 QT Interval:  380 QTC Calculation: 438 R Axis:   -2 Text Interpretation:  Sinus rhythm with Premature atrial complexes Cannot rule out Anterior infarct (cited on or before 16-Jun-2013) Abnormal ECG When compared with ECG of 25-Jun-2012 05:43, Premature atrial complexes are now Present Questionable change in initial forces of Anterior leads No significant change was found Confirmed by Wyvonnia Dusky  MD, Natalynn Pedone ([id number]) on 06/16/2013 9:32:38 AM            MDM   1. Bronchitis    1 week history of cough, congestion, eye irritation, nasal drainage. No chest pain or shortness of breath.  EKG unchanged. Chest x-ray is negative for infiltrate. Vital stable. No distress. Given length of illness, we'll treat for bronchitis. Patient to followup with PCP this week. Return precautions discussed.  I personally performed the services described in this documentation, which was scribed in my presence. The recorded information has been reviewed and is accurate.     Ezequiel Essex, MD 06/16/13 1016

## 2013-06-16 NOTE — ED Notes (Signed)
Patient c/o productive cough-thick yellow sputum. Patient also reports nasal congestion. Denies any fever but reports some nausea. Patient reports nasal drainage as clear with some blood noted.

## 2013-06-16 NOTE — Discharge Instructions (Signed)

## 2013-08-12 ENCOUNTER — Other Ambulatory Visit: Payer: Self-pay | Admitting: Physician Assistant

## 2014-03-16 ENCOUNTER — Encounter (HOSPITAL_COMMUNITY): Payer: Self-pay | Admitting: Emergency Medicine

## 2014-04-23 ENCOUNTER — Encounter (HOSPITAL_COMMUNITY): Payer: Self-pay | Admitting: Internal Medicine

## 2017-10-03 NOTE — Congregational Nurse Program (Signed)
[facility]  Date of Encounter: 10/03/2017  Past Medical History: Past Medical History:  Diagnosis Date  . Coronary artery disease   . Diabetes mellitus   . GERD (gastroesophageal reflux disease)   . Hypertension   . Myocardial abscess   . Myocardial infarct 06/24/2012    Encounter Details: CNP Questionnaire - 10/03/17 2102      Questionnaire   Patient Status  Not Applicable    Race  Black or African American    Location Patient Served At  [employer]  Not Applicable    Uninsured  Not Applicable    Food  No food insecurities    Housing/Utilities  Yes, have permanent housing    Transportation  No transportation needs    Interpersonal Safety  Yes, feel physically and emotionally safe where you currently live    Medication  No medication insecurities    Medical Provider  Yes    Referrals  Not Applicable    ED Visit Averted  Not Applicable    Life-Saving Intervention Made  Not Applicable       Seen at the [employer] B[employer]P 156/79, P 69. Reviewed importance of diet and exercise as well as taking medications daily Erma Heritage RN, [place] [facility] [phone]

## 2019-05-19 NOTE — H&P (Signed)
Surgical History & Physical  Patient Name: Sandra Schneider DOB: Jun 15, 1955  Surgery: Cataract extraction with intraocular lens implant phacoemulsification; Left Eye  Surgeon: Baruch Goldmann MD Surgery Date:  05/26/2019 Pre-Op Date:  05/19/2019  HPI: A 51 Yr. old female patient referred by Dr. Hassell Done for cataract (NPDR) 1. The patient complains of headlights glare causing poor vision, which began 6 months ago. Both eyes are affected. The patient describes foggy, ghosting, glare and hazy symptoms affecting their eyes/vision. Symptoms occur when the patient is driving and outside. The complaint is associated with glare and halos. OS worse c/w OD. Patient not comfortable driving at night with oncoming traffic and daylight driving becoming more difficult with time. HPI was performed by Baruch Goldmann .  Medical History: Cataracts NPDR Diabetes Heart Problem High Blood Pressure LDL acid reflux  Review of Systems Negative Allergic/Immunologic Negative Cardiovascular Negative Constitutional Negative Ear, Nose, Mouth & Throat Negative Endocrine Negative Eyes Negative Gastrointestinal Negative Genitourinary Negative Hemotologic/Lymphatic Negative Integumentary Negative Musculoskeletal Negative Neurological Negative Psychiatry Negative Respiratory  Social   Never smoked   Medication Vigamox, Ilevro,  Aspirin, Famotidine, Multivitamin, Cetirizine, Metoprolol, Glipizide, Lisinopril-HCTZ, Simvastatin, Janumet, Prednisolone acetate,   Sx/Procedures Cardiac stent x 2,   Drug Allergies   NKDA  History & Physical: Heent:  Cataract, Left eye NECK: supple without bruits LUNGS: lungs clear to auscultation CV: regular rate and rhythm Abdomen: soft and non-tender  Impression & Plan: Assessment: 1.  COMBINED FORMS AGE RELATED CATARACT; Both Eyes (H25.813) 2.  ASTIGMATISM, REGULAR; Both Eyes (H52.223) 3.  Ocular Hypertension; Both Eyes (H40.053)  Plan: 1.  Cataract accounts for  the patient's decreased vision. This visual impairment is not correctable with a tolerable change in glasses or contact lenses. Cataract surgery with an implantation of a new lens should significantly improve the visual and functional status of the patient. Discussed all risks, benefits, alternatives, and potential complications. Discussed the procedures and recovery. Patient desires to have surgery. A-scan ordered and performed today for intra-ocular lens calculations. The surgery will be performed in order to improve vision for driving, reading, and for eye examinations. Recommend phacoemulsification with intra-ocular lens. Left Eye worse -first. Dilates well - shugarcaine by protocol. 2.  Declines Toric Lens. 3.  Nerve looks non-glaucomatous. Will monitor IOP after cataract surgery.

## 2019-05-21 NOTE — Patient Instructions (Signed)
Sandra Schneider  05/21/2019     @PREFPERIOPPHARMACY @   Your procedure is scheduled on  05/26/2019 .  Report to [facility] at  [place].M.  Call this number if you have problems the morning of surgery:  [phone]   Remember:  Do not eat or drink after midnight.                        Take these medicines the morning of surgery with A SIP OF WATER  Zyrtec, pepcid, metoprolol.    Do not wear jewelry, make-up or nail polish.  Do not wear lotions, powders, or perfumes. Please wear deodorant and brush your teeth.  Do not shave 48 hours prior to surgery.  Men may shave face and neck.  Do not bring valuables to the hospital.  [facility] is not responsible for any belongings or valuables.  Contacts, dentures or bridgework may not be worn into surgery.  Leave your suitcase in the car.  After surgery it may be brought to your room.  For patients admitted to the hospital, discharge time will be determined by your treatment team.  Patients discharged the day of surgery will not be allowed to drive home.   Name and phone number of your driver:   family Special instructions:  None  Please read over the following fact sheets that you were given. Anesthesia Post-op Instructions and Care and Recovery After Surgery       Cataract Surgery, Care After This sheet gives you information about how to care for yourself after your procedure. Your health care provider may also give you more specific instructions. If you have problems or questions, contact your health care provider. What can I expect after the procedure? After the procedure, it is common to have:  Itching.  Discomfort.  Fluid discharge.  Sensitivity to light and to touch.  Bruising in or around the eye.  Mild blurred vision. Follow these instructions at home: Eye care   Do not touch or rub your eyes.  Protect your eyes as told by your health care provider. You may be told to wear a protective eye shield  or sunglasses.  Do not put a contact lens into the affected eye or eyes until your health care provider approves.  Keep the area around your eye clean and dry: ? Avoid swimming. ? Do not allow water to hit you directly in the face while showering. ? Keep soap and shampoo out of your eyes.  Check your eye every day for signs of infection. Watch for: ? Redness, swelling, or pain. ? Fluid, blood, or pus. ? Warmth. ? A bad smell. ? Vision that is getting worse. ? Sensitivity that is getting worse. Activity  Do not drive for 24 hours if you were given a sedative during your procedure.  Avoid strenuous activities, such as playing contact sports, for as long as told by your health care provider.  Do not drive or use heavy machinery until your health care provider approves.  Do not bend or lift heavy objects. Bending increases pressure in the eye. You can walk, climb stairs, and do light household chores.  Ask your health care provider when you can return to work. If you work in a dusty environment, you may be advised to wear protective eyewear for a period of time. General instructions  Take or apply over-the-counter and prescription medicines only as told by your health care  provider. This includes eye drops.  Keep all follow-up visits as told by your health care provider. This is important. Contact a health care provider if:  You have increased bruising around your eye.  You have pain that is not helped with medicine.  You have a fever.  You have redness, swelling, or pain in your eye.  You have fluid, blood, or pus coming from your incision.  Your vision gets worse.  Your sensitivity to light gets worse. Get help right away if:  You have sudden loss of vision.  You see flashes of light or spots (floaters).  You have severe eye pain.  You develop nausea or vomiting. Summary  After your procedure, it is common to have itching, discomfort, bruising, fluid discharge,  or sensitivity to light.  Follow instructions from your health care provider about caring for your eye after the procedure.  Do not rub your eye after the procedure. You may need to wear eye protection or sunglasses. Do not wear contact lenses. Keep the area around your eye clean and dry.  Avoid activities that require a lot of effort. These include playing sports and lifting heavy objects.  Contact a health care provider if you have increased bruising, pain that does not go away, or a fever. Get help right away if you suddenly lose your vision, see flashes of light or spots, or have severe pain in the eye. This information is not intended to replace advice given to you by your health care provider. Make sure you discuss any questions you have with your health care provider. Document Revised: 02/25/2019 Document Reviewed: 10/29/2017 Elsevier Patient Education  2020 [place] After These instructions provide you with information about caring for yourself after your procedure. Your health care provider may also give you more specific instructions. Your treatment has been planned according to current medical practices, but problems sometimes occur. Call your health care provider if you have any problems or questions after your procedure. What can I expect after the procedure? After your procedure, you may:  Feel sleepy for several hours.  Feel clumsy and have poor balance for several hours.  Feel forgetful about what happened after the procedure.  Have poor judgment for several hours.  Feel nauseous or vomit.  Have a sore throat if you had a breathing tube during the procedure. Follow these instructions at home: For at least 24 hours after the procedure:      Have a responsible adult stay with you. It is important to have someone help care for you until you are awake and alert.  Rest as needed.  Do not: ? Participate in activities in which you  could fall or become injured. ? Drive. ? Use heavy machinery. ? Drink alcohol. ? Take sleeping pills or medicines that cause drowsiness. ? Make important decisions or sign legal documents. ? Take care of children on your own. Eating and drinking  Follow the diet that is recommended by your health care provider.  If you vomit, drink water, juice, or soup when you can drink without vomiting.  Make sure you have little or no nausea before eating solid foods. General instructions  Take over-the-counter and prescription medicines only as told by your health care provider.  If you have sleep apnea, surgery and certain medicines can increase your risk for breathing problems. Follow instructions from your health care provider about wearing your sleep device: ? Anytime you are sleeping, including during daytime naps. ? While  taking prescription pain medicines, sleeping medicines, or medicines that make you drowsy.  If you smoke, do not smoke without supervision.  Keep all follow-up visits as told by your health care provider. This is important. Contact a health care provider if:  You keep feeling nauseous or you keep vomiting.  You feel light-headed.  You develop a rash.  You have a fever. Get help right away if:  You have trouble breathing. Summary  For several hours after your procedure, you may feel sleepy and have poor judgment.  Have a responsible adult stay with you for at least 24 hours or until you are awake and alert. This information is not intended to replace advice given to you by your health care provider. Make sure you discuss any questions you have with your health care provider. Document Revised: 07/30/2017 Document Reviewed: 08/22/2015 Elsevier Patient Education  [place].

## 2019-05-22 ENCOUNTER — Encounter (HOSPITAL_COMMUNITY)
Admission: RE | Admit: 2019-05-22 | Discharge: 2019-05-22 | Disposition: A | Discharge status: Home / Self Care | Payer: Self-pay | Source: Ambulatory Visit | Attending: Ophthalmology | Admitting: Ophthalmology

## 2019-05-22 ENCOUNTER — Other Ambulatory Visit (HOSPITAL_COMMUNITY): Payer: Self-pay

## 2019-05-22 ENCOUNTER — Other Ambulatory Visit (HOSPITAL_COMMUNITY)
Admission: RE | Admit: 2019-05-22 | Discharge: 2019-05-22 | Disposition: A | Discharge status: Home / Self Care | Payer: Self-pay | Source: Ambulatory Visit | Attending: Ophthalmology | Admitting: Ophthalmology

## 2019-05-22 ENCOUNTER — Other Ambulatory Visit: Payer: Self-pay

## 2019-06-06 ENCOUNTER — Other Ambulatory Visit (HOSPITAL_COMMUNITY): Payer: Self-pay

## 2019-06-06 ENCOUNTER — Encounter (HOSPITAL_COMMUNITY): Payer: Self-pay

## 2019-07-09 NOTE — H&P (Signed)
Surgical History & Physical  Patient Name: Sandra Schneider DOB: 1956/05/01  Surgery: Cataract extraction with intraocular lens implant phacoemulsification; Left Eye  Surgeon: Baruch Goldmann MD Surgery Date:  07/21/2019 Pre-Op Date:  07/09/2019  HPI: [name] patient referred by Dr. Hassell Schneider for cataract (NPDR) 1. The patient complains of headlights glare causing poor vision, which began 6 months ago. Both eyes are affected. The patient describes foggy, ghosting, glare and hazy symptoms affecting their eyes/vision. Symptoms occur when the patient is driving and outside. The complaint is associated with glare and halos. OS worse c/w OD. Patient not comfortable driving at night with oncoming traffic and daylight driving becoming more difficult with time. HPI was performed by Baruch Goldmann .  Medical History: Cataracts NPDR Diabetes Heart Problem High Blood Pressure LDL acid reflux  Review of Systems Negative Allergic/Immunologic Negative Cardiovascular Negative Constitutional Negative Ear, Nose, Mouth & Throat Negative Endocrine Negative Eyes Negative Gastrointestinal Negative Genitourinary Negative Hemotologic/Lymphatic Negative Integumentary Negative Musculoskeletal Negative Neurological Negative Psychiatry Negative Respiratory  Social   Never smoked    Medications Vigamox, Ilevro,  Aspirin, Famotidine, Multivitamin, Cetirizine, Metoprolol, Glipizide, Lisinopril-HCTZ, Simvastatin, Janumet, Prednisolone acetate,   Sx/Procedures Cardiac stent x 2,   Drug Allergies   NKDA  History & Physical: Heent:  Cataract, Left eye NECK: supple without bruits LUNGS: lungs clear to auscultation CV: regular rate and rhythm Abdomen: soft and non-tender  Impression & Plan: Assessment: 1.  COMBINED FORMS AGE RELATED CATARACT; Both Eyes (H25.813) 2.  ASTIGMATISM, REGULAR; Both Eyes (H52.223) 3.  Ocular Hypertension; Both Eyes (H40.053)  Plan: 1.  Cataract accounts  for the patient's decreased vision. This visual impairment is not correctable with a tolerable change in glasses or contact lenses. Cataract surgery with an implantation of a new lens should significantly improve the visual and functional status of the patient. Discussed all risks, benefits, alternatives, and potential complications. Discussed the procedures and recovery. Patient desires to have surgery. A-scan ordered and performed today for intra-ocular lens calculations. The surgery will be performed in order to improve vision for driving, reading, and for eye examinations. Recommend phacoemulsification with intra-ocular lens. Left Eye worse -first. Dilates well - shugarcaine by protocol. 2.  Declines Toric Lens. 3.  Nerve looks non-glaucomatous. Will monitor IOP after cataract surgery.

## 2019-07-18 ENCOUNTER — Other Ambulatory Visit (HOSPITAL_COMMUNITY)
Admission: RE | Admit: 2019-07-18 | Discharge: 2019-07-18 | Disposition: A | Discharge status: Home / Self Care | Payer: Medicare Other | Source: Ambulatory Visit | Attending: Ophthalmology | Admitting: Ophthalmology

## 2019-07-18 ENCOUNTER — Other Ambulatory Visit: Payer: Self-pay

## 2019-07-18 ENCOUNTER — Encounter (HOSPITAL_COMMUNITY)
Admission: RE | Admit: 2019-07-18 | Discharge: 2019-07-18 | Disposition: A | Discharge status: Home / Self Care | Payer: Medicare Other | Source: Ambulatory Visit | Attending: Ophthalmology | Admitting: Ophthalmology

## 2019-07-18 ENCOUNTER — Encounter (HOSPITAL_COMMUNITY): Payer: Self-pay

## 2019-07-18 DIAGNOSIS — Z01812 Encounter for preprocedural laboratory examination: Secondary | ICD-10-CM | POA: Insufficient documentation

## 2019-07-18 DIAGNOSIS — Z20822 Contact with and (suspected) exposure to covid-19: Secondary | ICD-10-CM | POA: Insufficient documentation

## 2019-07-18 LAB — BASIC METABOLIC PANEL
Anion gap: 9 (ref 5–15)
BUN: 24 mg/dL — ABNORMAL HIGH (ref 8–23)
CO2: 27 mmol/L (ref 22–32)
Calcium: 9.2 mg/dL (ref 8.9–10.3)
Chloride: 100 mmol/L (ref 98–111)
Creatinine, Ser: 0.81 mg/dL (ref 0.44–1.00)
GFR calc Af Amer: >60 mL/min (ref >60)
GFR calc non Af Amer: >60 mL/min (ref >60)
Glucose, Bld: 88 mg/dL (ref 70–99)
Potassium: 3.9 mmol/L (ref 3.5–5.1)
Sodium: 136 mmol/L (ref 135–145)

## 2019-07-18 LAB — GLUCOSE, CAPILLARY: Glucose-Capillary: 91 mg/dL (ref 70–99)

## 2019-07-18 LAB — HEMOGLOBIN A1C
Hgb A1c MFr Bld: 6.1 % — ABNORMAL HIGH (ref 4.8–5.6)
Mean Plasma Glucose: 128.37 mg/dL

## 2019-07-18 MED ORDER — SIMETHICONE 40 MG/0.6ML PO SUSP
ORAL | Status: AC
  Filled 2019-07-18: qty 0.6

## 2019-07-18 NOTE — Patient Instructions (Signed)
Sandra Schneider  07/18/2019     @PREFPERIOPPHARMACY @   Your procedure is scheduled on  07/21/2019 .  Report to [facility] at  1030  A.M.  Call this number if you have problems the morning of surgery:  [phone]   Remember:  Do not eat or drink after midnight.                        Take these medicines the morning of surgery with A SIP OF WATER pepcid, metoprolol.    Do not wear jewelry, make-up or nail polish.  Do not wear lotions, powders, or perfumes. Please wear deodorant and brush your teeth.  Do not shave 48 hours prior to surgery.  Men may shave face and neck.  Do not bring valuables to the hospital.  [facility] is not responsible for any belongings or valuables.  Contacts, dentures or bridgework may not be worn into surgery.  Leave your suitcase in the car.  After surgery it may be brought to your room.  For patients admitted to the hospital, discharge time will be determined by your treatment team.  Patients discharged the day of surgery will not be allowed to drive home.   Name and phone number of your driver:   family Special instructions:  DO NOT smoke the day of your procedure.  Please read over the following fact sheets that you were given. Anesthesia Post-op Instructions and Care and Recovery After Surgery       Cataract Surgery, Care After This sheet gives you information about how to care for yourself after your procedure. Your health care provider may also give you more specific instructions. If you have problems or questions, contact your health care provider. What can I expect after the procedure? After the procedure, it is common to have:  Itching.  Discomfort.  Fluid discharge.  Sensitivity to light and to touch.  Bruising in or around the eye.  Mild blurred vision. Follow these instructions at home: Eye care   Do not touch or rub your eyes.  Protect your eyes as told by your health care provider. You may be told to  wear a protective eye shield or sunglasses.  Do not put a contact lens into the affected eye or eyes until your health care provider approves.  Keep the area around your eye clean and dry: ? Avoid swimming. ? Do not allow water to hit you directly in the face while showering. ? Keep soap and shampoo out of your eyes.  Check your eye every day for signs of infection. Watch for: ? Redness, swelling, or pain. ? Fluid, blood, or pus. ? Warmth. ? A bad smell. ? Vision that is getting worse. ? Sensitivity that is getting worse. Activity  Do not drive for 24 hours if you were given a sedative during your procedure.  Avoid strenuous activities, such as playing contact sports, for as long as told by your health care provider.  Do not drive or use heavy machinery until your health care provider approves.  Do not bend or lift heavy objects. Bending increases pressure in the eye. You can walk, climb stairs, and do light household chores.  Ask your health care provider when you can return to work. If you work in a dusty environment, you may be advised to wear protective eyewear for a period of time. General instructions  Take or apply over-the-counter and prescription medicines only  as told by your health care provider. This includes eye drops.  Keep all follow-up visits as told by your health care provider. This is important. Contact a health care provider if:  You have increased bruising around your eye.  You have pain that is not helped with medicine.  You have a fever.  You have redness, swelling, or pain in your eye.  You have fluid, blood, or pus coming from your incision.  Your vision gets worse.  Your sensitivity to light gets worse. Get help right away if:  You have sudden loss of vision.  You see flashes of light or spots (floaters).  You have severe eye pain.  You develop nausea or vomiting. Summary  After your procedure, it is common to have itching,  discomfort, bruising, fluid discharge, or sensitivity to light.  Follow instructions from your health care provider about caring for your eye after the procedure.  Do not rub your eye after the procedure. You may need to wear eye protection or sunglasses. Do not wear contact lenses. Keep the area around your eye clean and dry.  Avoid activities that require a lot of effort. These include playing sports and lifting heavy objects.  Contact a health care provider if you have increased bruising, pain that does not go away, or a fever. Get help right away if you suddenly lose your vision, see flashes of light or spots, or have severe pain in the eye. This information is not intended to replace advice given to you by your health care provider. Make sure you discuss any questions you have with your health care provider. Document Revised: 02/25/2019 Document Reviewed: 10/29/2017 Elsevier Patient Education  2020 [place] After These instructions provide you with information about caring for yourself after your procedure. Your health care provider may also give you more specific instructions. Your treatment has been planned according to current medical practices, but problems sometimes occur. Call your health care provider if you have any problems or questions after your procedure. What can I expect after the procedure? After your procedure, you may:  Feel sleepy for several hours.  Feel clumsy and have poor balance for several hours.  Feel forgetful about what happened after the procedure.  Have poor judgment for several hours.  Feel nauseous or vomit.  Have a sore throat if you had a breathing tube during the procedure. Follow these instructions at home: For at least 24 hours after the procedure:      Have a responsible adult stay with you. It is important to have someone help care for you until you are awake and alert.  Rest as needed.  Do  not: ? Participate in activities in which you could fall or become injured. ? Drive. ? Use heavy machinery. ? Drink alcohol. ? Take sleeping pills or medicines that cause drowsiness. ? Make important decisions or sign legal documents. ? Take care of children on your own. Eating and drinking  Follow the diet that is recommended by your health care provider.  If you vomit, drink water, juice, or soup when you can drink without vomiting.  Make sure you have little or no nausea before eating solid foods. General instructions  Take over-the-counter and prescription medicines only as told by your health care provider.  If you have sleep apnea, surgery and certain medicines can increase your risk for breathing problems. Follow instructions from your health care provider about wearing your sleep device: ? Anytime you are sleeping,  including during daytime naps. ? While taking prescription pain medicines, sleeping medicines, or medicines that make you drowsy.  If you smoke, do not smoke without supervision.  Keep all follow-up visits as told by your health care provider. This is important. Contact a health care provider if:  You keep feeling nauseous or you keep vomiting.  You feel light-headed.  You develop a rash.  You have a fever. Get help right away if:  You have trouble breathing. Summary  For several hours after your procedure, you may feel sleepy and have poor judgment.  Have a responsible adult stay with you for at least 24 hours or until you are awake and alert. This information is not intended to replace advice given to you by your health care provider. Make sure you discuss any questions you have with your health care provider. Document Revised: 07/30/2017 Document Reviewed: 08/22/2015 Elsevier Patient Education  [place].

## 2019-07-18 NOTE — Progress Notes (Signed)
   07/18/19 0940  OBSTRUCTIVE SLEEP APNEA  Have you ever been diagnosed with sleep apnea through a sleep study? No  Do you snore loudly (loud enough to be heard through closed doors)?  1  Has anyone observed you stop breathing during your sleep? 1  Do you have, or are you being treated for high blood pressure? 1  BMI more than 35 kg/m2? 1  Age > 50 (1-yes) 1  Neck circumference greater than:Female 16 inches or larger, Female 17inches or larger? 1  Female Gender (Yes=1) 0  Obstructive Sleep Apnea Score 6

## 2019-07-19 LAB — SARS CORONAVIRUS 2 (TAT 6-24 HRS): SARS Coronavirus 2: NEGATIVE

## 2019-07-21 ENCOUNTER — Encounter (HOSPITAL_COMMUNITY): Payer: Self-pay | Admitting: Ophthalmology

## 2019-07-21 ENCOUNTER — Ambulatory Visit (HOSPITAL_COMMUNITY)
Admission: RE | Admit: 2019-07-21 | Discharge: 2019-07-21 | Disposition: A | Discharge status: Home / Self Care | Payer: Medicare Other | Attending: Ophthalmology | Admitting: Ophthalmology

## 2019-07-21 ENCOUNTER — Ambulatory Visit (HOSPITAL_COMMUNITY): Payer: Medicare Other | Admitting: Anesthesiology

## 2019-07-21 ENCOUNTER — Other Ambulatory Visit: Payer: Self-pay

## 2019-07-21 ENCOUNTER — Encounter (HOSPITAL_COMMUNITY)
Admission: RE | Disposition: A | Discharge status: Home / Self Care | Payer: Self-pay | Source: Home / Self Care | Attending: Ophthalmology

## 2019-07-21 DIAGNOSIS — Z79899 Other long term (current) drug therapy: Secondary | ICD-10-CM | POA: Insufficient documentation

## 2019-07-21 DIAGNOSIS — H52223 Regular astigmatism, bilateral: Secondary | ICD-10-CM | POA: Insufficient documentation

## 2019-07-21 DIAGNOSIS — H25813 Combined forms of age-related cataract, bilateral: Secondary | ICD-10-CM | POA: Diagnosis not present

## 2019-07-21 DIAGNOSIS — I2510 Atherosclerotic heart disease of native coronary artery without angina pectoris: Secondary | ICD-10-CM | POA: Diagnosis not present

## 2019-07-21 DIAGNOSIS — Z7982 Long term (current) use of aspirin: Secondary | ICD-10-CM | POA: Diagnosis not present

## 2019-07-21 DIAGNOSIS — E1136 Type 2 diabetes mellitus with diabetic cataract: Secondary | ICD-10-CM | POA: Diagnosis not present

## 2019-07-21 DIAGNOSIS — I252 Old myocardial infarction: Secondary | ICD-10-CM | POA: Insufficient documentation

## 2019-07-21 DIAGNOSIS — Z7984 Long term (current) use of oral hypoglycemic drugs: Secondary | ICD-10-CM | POA: Insufficient documentation

## 2019-07-21 DIAGNOSIS — I10 Essential (primary) hypertension: Secondary | ICD-10-CM | POA: Diagnosis not present

## 2019-07-21 DIAGNOSIS — H40053 Ocular hypertension, bilateral: Secondary | ICD-10-CM | POA: Insufficient documentation

## 2019-07-21 HISTORY — PX: CATARACT EXTRACTION W/PHACO: SHX586

## 2019-07-21 HISTORY — PX: ANTERIOR VITRECTOMY: SHX1173

## 2019-07-21 LAB — GLUCOSE, CAPILLARY: Glucose-Capillary: 73 mg/dL (ref 70–99)

## 2019-07-21 SURGERY — PHACOEMULSIFICATION, CATARACT, WITH IOL INSERTION
Anesthesia: Monitor Anesthesia Care | Site: Eye | Laterality: Left

## 2019-07-21 MED ORDER — LIDOCAINE HCL 3.5 % OP GEL: 1.0000 "application " | Freq: Once | OPHTHALMIC | Status: DC

## 2019-07-21 MED ORDER — NEOMYCIN-POLYMYXIN-DEXAMETH 3.5-10000-0.1 OP SUSP
OPHTHALMIC | Status: DC | PRN
  Administered 2019-07-21: 1 [drp] via OPHTHALMIC

## 2019-07-21 MED ORDER — BSS IO SOLN
INTRAOCULAR | Status: DC | PRN
  Administered 2019-07-21: 15 mL via INTRAOCULAR

## 2019-07-21 MED ORDER — POVIDONE-IODINE 5 % OP SOLN
OPHTHALMIC | Status: DC | PRN
  Administered 2019-07-21: 1 via OPHTHALMIC

## 2019-07-21 MED ORDER — PHENYLEPHRINE HCL 2.5 % OP SOLN
1.0000 [drp] | OPHTHALMIC | Status: AC | PRN
  Administered 2019-07-21 (×3): 1 [drp] via OPHTHALMIC

## 2019-07-21 MED ORDER — TETRACAINE HCL 0.5 % OP SOLN
1.0000 [drp] | OPHTHALMIC | Status: AC | PRN
  Administered 2019-07-21 (×3): 1 [drp] via OPHTHALMIC

## 2019-07-21 MED ORDER — CARBACHOL 0.01 % IO SOLN
INTRAOCULAR | Status: DC | PRN
  Administered 2019-07-21: 1 mL via INTRAOCULAR

## 2019-07-21 MED ORDER — PROVISC 10 MG/ML IO SOLN
INTRAOCULAR | Status: DC | PRN
  Administered 2019-07-21: 0.85 mL via INTRAOCULAR

## 2019-07-21 MED ORDER — EPINEPHRINE PF 1 MG/ML IJ SOLN
INTRAOCULAR | Status: DC | PRN
  Administered 2019-07-21: 500 mL

## 2019-07-21 MED ORDER — LIDOCAINE HCL (PF) 1 % IJ SOLN
INTRAOCULAR | Status: DC | PRN
  Administered 2019-07-21: 1 mL via OPHTHALMIC

## 2019-07-21 MED ORDER — CARBACHOL 0.01 % IO SOLN
INTRAOCULAR | Status: AC
  Filled 2019-07-21: qty 1.5

## 2019-07-21 MED ORDER — SODIUM HYALURONATE 23 MG/ML IO SOLN
INTRAOCULAR | Status: DC | PRN
  Administered 2019-07-21: 0.6 mL via INTRAOCULAR

## 2019-07-21 MED ORDER — CYCLOPENTOLATE-PHENYLEPHRINE 0.2-1 % OP SOLN
1.0000 [drp] | OPHTHALMIC | Status: AC | PRN
  Administered 2019-07-21 (×3): 1 [drp] via OPHTHALMIC

## 2019-07-21 SURGICAL SUPPLY — 20 items
CLOTH BEACON ORANGE TIMEOUT ST (SAFETY) ×1 IMPLANT
DEVICE MILOOP (MISCELLANEOUS) IMPLANT
DUOVISC SYSTEM (INTRAOCULAR LENS) ×2
EYE SHIELD UNIVERSAL CLEAR (GAUZE/BANDAGES/DRESSINGS) ×1 IMPLANT
GLOVE BIOGEL PI IND STRL 7.0 (GLOVE) IMPLANT
GLOVE BIOGEL PI INDICATOR 7.0 (GLOVE) ×2
MILOOP DEVICE (MISCELLANEOUS)
NDL HYPO 18GX1.5 BLUNT FILL (NEEDLE) IMPLANT
NEEDLE HYPO 18GX1.5 BLUNT FILL (NEEDLE) ×2 IMPLANT
PACK VIT ANT 23G (MISCELLANEOUS) ×1 IMPLANT
PAD ARMBOARD 7.5X6 YLW CONV (MISCELLANEOUS) ×1 IMPLANT
RING MALYGIN (MISCELLANEOUS) IMPLANT
RING MALYGIN 7.0 (MISCELLANEOUS) IMPLANT
SIGHTPATH CAT PROC W REG LENS (Ophthalmic Related) ×1 IMPLANT
SYR TB 1ML LL NO SAFETY (SYRINGE) ×1 IMPLANT
SYSTEM DUOVISC (INTRAOCULAR LENS) IMPLANT
TAPE SURG TRANSPORE 1 IN (GAUZE/BANDAGES/DRESSINGS) IMPLANT
TAPE SURGICAL TRANSPORE 1 IN (GAUZE/BANDAGES/DRESSINGS) ×1
VISCOELASTIC ADDITIONAL (OPHTHALMIC RELATED) ×1 IMPLANT
WATER STERILE IRR 250ML POUR (IV SOLUTION) ×1 IMPLANT

## 2019-07-21 NOTE — Interval H&P Note (Signed)
History and Physical Interval Note: The H and P was reviewed and updated. The patient was examined.  No changes were found after exam.  The surgical eye was marked.  07/21/2019 11:05 AM  Sandra Schneider  has presented today for surgery, with the diagnosis of Nuclear sclerotic cataract - Left eye.  The various methods of treatment have been discussed with the patient and family. After consideration of risks, benefits and other options for treatment, the patient has consented to  Procedure(s) with comments: CATARACT EXTRACTION PHACO AND INTRAOCULAR LENS PLACEMENT ([name]) (Left) - left as a surgical intervention.  The patient's history has been reviewed, patient examined, no change in status, stable for surgery.  I have reviewed the patient's chart and labs.  Questions were answered to the patient's satisfaction.     Baruch Goldmann

## 2019-07-21 NOTE — Transfer of Care (Signed)
Immediate Anesthesia Transfer of Care Note  Patient: Sandra Schneider  Procedure(s) Performed: CATARACT EXTRACTION PHACO AND INTRAOCULAR LENS PLACEMENT ([facility]) (CDE: 11.45) (Left Eye)  Patient Location: Short Stay  Anesthesia Type:MAC  Level of Consciousness: awake, alert  and oriented  Airway & Oxygen Therapy: Patient Spontanous Breathing  Post-op Assessment: Report given to RN and Post -op Vital signs reviewed and stable  Post vital signs: Reviewed and stable  Last Vitals:  Vitals Value Taken Time  BP    Temp    Pulse    Resp    SpO2      Last Pain:  Vitals:   07/21/19 1039  TempSrc: Oral  PainSc: 0-No pain         Complications: No apparent anesthesia complications

## 2019-07-21 NOTE — Anesthesia Preprocedure Evaluation (Addendum)
Anesthesia Evaluation  Patient identified by MRN, date of birth, ID band Patient awake    Reviewed: Allergy & Precautions, H&P , NPO status , Patient's Chart, lab work & pertinent test results, reviewed documented beta blocker date and time   Airway Mallampati: III  TM Distance: >3 FB Neck ROM: full    Dental  (+) Upper Dentures, Lower Dentures   Pulmonary neg pulmonary ROS,    Pulmonary exam normal        Cardiovascular hypertension, + CAD and + Past MI  Normal cardiovascular exam     Neuro/Psych neg Seizures    GI/Hepatic Neg liver ROS, GERD  ,  Endo/Other  diabetes  Renal/GU      Musculoskeletal   Abdominal   Peds  Hematology  (+) Blood dyscrasia, anemia ,   Anesthesia Other Findings   Reproductive/Obstetrics negative OB ROS                            Anesthesia Physical Anesthesia Plan  ASA: III  Anesthesia Plan: MAC   Post-op Pain Management:    Induction:   PONV Risk Score and Plan: 2 and Treatment may vary due to age or medical condition  Airway Management Planned:   Additional Equipment:   Intra-op Plan:   Post-operative Plan:   Informed Consent:   Plan Discussed with: CRNA  Anesthesia Plan Comments:         Anesthesia Quick Evaluation

## 2019-07-21 NOTE — Discharge Instructions (Signed)
Please discharge patient when stable, will follow up today with Dr. Chaslyn Eisen at the [facility] [place] office immediately following discharge.  Leave shield in place until visit.  All paperwork with discharge instructions will be given at the office.  [facility] Address:  [address]  [place], [place] [address]  

## 2019-07-21 NOTE — Op Note (Signed)
Date of procedure: 07/21/19  Pre-operative diagnosis: Visually significant age-related combined cataract, Left Eye (H25.812)  Post-operative diagnosis: Visually significant age-related combined cataract, Left Eye (H25.812); Posterior capsule tear, left eye  Procedure: 1. Removal of cataract via phacoemulsification and insertion of intra-ocular lens Alcon [id number]  +13.0D into the sulcus of the Left Eye 2. Anterior vitrectomy, left eye Attending surgeon: Gerda Diss. Jozeph Persing, MD, MA  Anesthesia: MAC, Topical Akten  Complications: Posterior capsule tear  Estimated Blood Loss: <67m (minimal)  Specimens: None  Implants: As above  Indications:  Visually significant age-related cataract, Left Eye  Procedure:  The patient was seen and identified in the pre-operative area. The operative eye was identified and dilated.  The operative eye was marked.  Topical anesthesia was administered to the operative eye.     The patient was then to the operative suite and placed in the supine position.  A timeout was performed confirming the patient, procedure to be performed, and all other relevant information.   The patient's face was prepped and draped in the usual fashion for intra-ocular surgery.  A lid speculum was placed into the operative eye and the surgical microscope moved into place and focused.  An inferotemporal paracentesis was created using a 20 gauge paracentesis blade.  Shugarcaine was injected into the anterior chamber.  Viscoelastic was injected into the anterior chamber.  A temporal clear-corneal main wound incision was created using a 2.446mmicrokeratome.  A continuous curvilinear capsulorrhexis was initiated using an irrigating cystitome and completed using capsulorrhexis forceps.  Hydrodissection and hydrodeliniation were performed.  Viscoelastic was injected into the anterior chamber.  A phacoemulsification handpiece and a chopper as a second instrument were used to remove the nucleus and  epinucleus. At this point, a large posterior capsule tear was noted.  Viscoat was injected into the eye.  A complete anterior vitrectomy was performed.  The vitrector was used to remove remaining cortex.  Provisc was injected into the eye.  A second paracentesis was created superotemporally.  The main wound was enlarged to 2.7563m The intraocular lens was inserted into the sulcus.  The vitrector was used to remove any remaining viscoelastic.  Miostat was injected into the anterior chamber.  The clear corneal wound and paracentesis wounds were then hydrated and checked with Weck-Cels to be watertight and free of vitreous.  The lid-speculum and drape was removed, and the patient's face was cleaned with a wet and dry 4x4.  Maxitrol was instilled in the eye before a clear shield was taped over the eye. The patient was taken to the post-operative care unit in good condition, having tolerated the procedure well.  Post-Op Instructions: [facility] and will receive all other orders and instructions.

## 2019-07-21 NOTE — Anesthesia Postprocedure Evaluation (Signed)
Anesthesia Post Note  Patient: Sandra Schneider  Procedure(s) Performed: CATARACT EXTRACTION PHACO AND INTRAOCULAR LENS PLACEMENT (IOC) (CDE: 11.45) (Left Eye)  Patient location during evaluation: Short Stay Anesthesia Type: MAC Level of consciousness: awake and alert Pain management: pain level controlled Vital Signs Assessment: post-procedure vital signs reviewed and stable Respiratory status: spontaneous breathing Cardiovascular status: stable Postop Assessment: no apparent nausea or vomiting Anesthetic complications: no     Last Vitals:  Vitals:   07/21/19 1039 07/21/19 1151  BP: (!) 146/54 (!) 181/64  Pulse: 63 63  Resp: 20 20  Temp: 36.9 C 36.6 C  SpO2: 95% 95%    Last Pain:  Vitals:   07/21/19 1151  TempSrc: Oral  PainSc: 0-No pain                 Everette Rank

## 2019-07-28 NOTE — H&P (Signed)
Surgical History & Physical  Patient Name: Sandra Schneider DOB: 07/05/1955  Surgery: Cataract extraction with intraocular lens implant phacoemulsification; Right Eye  Surgeon: Baruch Goldmann MD Surgery Date:  08/04/2019 Pre-Op Date:  07/28/2019  HPI: A 24 Yr. old female patient 1. 1. The patient is returning after cataract surgery. The left eye is affected. Status post cataract surgery on 07-21-2019: Since the last visit, the affected area feels improvement and is doing well. The patient's vision is improved. Patient is following medication instructions with 3 in 1 drop BID. Patient can see much brighter and clearer since surgery OS. OD has a dull film now c/w OS. OU not working together. Using OS primarily for VA. Can close OD to function better, even without glasses. This is negatively affecting the patient's quality of life.Patient eager to proceed with OD for OU to work together. Patient admits OS tearing and itching minimally since surgery at times. HPI Completed by Dr. Baruch Goldmann  Medical History: Cataracts NPDR Diabetes Heart Problem High Blood Pressure LDL acid reflux  Review of Systems Negative Allergic/Immunologic Negative Cardiovascular Negative Constitutional Negative Ear, Nose, Mouth & Throat Negative Endocrine Negative Eyes Negative Gastrointestinal Negative Genitourinary Negative Hemotologic/Lymphatic Negative Integumentary Negative Musculoskeletal Negative Neurological Negative Psychiatry Negative Respiratory  Social   Never smoked   Medication Prednisolone-gatiflox-bromfenac,  Aspirin, Famotidine, Multivitamin, Cetirizine, Metoprolol, Glipizide, Lisinopril-HCTZ, Simvastatin, Janumet,   Sx/Procedures Phaco c IOL OS,  Cardiac stent x 2,   Drug Allergies   NKDA  History & Physical: Heent:  Cataract, Right eye NECK: supple without bruits LUNGS: lungs clear to auscultation CV: regular rate and rhythm Abdomen: soft and non-tender  Impression &  Plan: Assessment: 1.  COMBINED FORMS AGE RELATED CATARACT; , Right Eye (H25.811) 2.  CATARACT EXTRACTION STATUS; Left Eye (Z98.42)  Plan: 1.  Cataract accounts for the patient's decreased vision. This visual impairment is not correctable with a tolerable change in glasses or contact lenses. Cataract surgery with an implantation of a new lens should significantly improve the visual and functional status of the patient. Discussed all risks, benefits, alternatives, and potential complications. Discussed the procedures and recovery. Patient desires to have surgery. A-scan ordered and performed today for intra-ocular lens calculations. The surgery will be performed in order to improve vision for driving, reading, and for eye examinations. Recommend phacoemulsification with intra-ocular lens. Right Eye. Surgery required to correct imbalance of vision. Dilates well - shugarcaine by protocol. 2.  1 week after cataract surgery. Doing well with improved vision and normal eye pressure. Call with any problems or concerns. Continue Gati-Brom-Pred 2x/day for 3 more weeks.

## 2019-07-31 ENCOUNTER — Other Ambulatory Visit: Payer: Self-pay

## 2019-07-31 ENCOUNTER — Encounter (HOSPITAL_COMMUNITY)
Admission: RE | Admit: 2019-07-31 | Discharge: 2019-07-31 | Disposition: A | Discharge status: Home / Self Care | Payer: Medicare Other | Source: Ambulatory Visit | Attending: Ophthalmology | Admitting: Ophthalmology

## 2019-08-01 ENCOUNTER — Other Ambulatory Visit (HOSPITAL_COMMUNITY)
Admission: RE | Admit: 2019-08-01 | Discharge: 2019-08-01 | Disposition: A | Discharge status: Home / Self Care | Payer: Medicare Other | Source: Ambulatory Visit | Attending: Ophthalmology | Admitting: Ophthalmology

## 2019-08-01 ENCOUNTER — Other Ambulatory Visit (HOSPITAL_COMMUNITY): Payer: Medicare Other

## 2019-08-01 DIAGNOSIS — Z20822 Contact with and (suspected) exposure to covid-19: Secondary | ICD-10-CM | POA: Insufficient documentation

## 2019-08-01 DIAGNOSIS — Z01812 Encounter for preprocedural laboratory examination: Secondary | ICD-10-CM | POA: Insufficient documentation

## 2019-08-01 MED ORDER — PHENYLEPHRINE HCL 2.5 % OP SOLN
OPHTHALMIC | Status: AC
  Filled 2019-08-01: qty 15

## 2019-08-01 MED ORDER — CYCLOPENTOLATE-PHENYLEPHRINE 0.2-1 % OP SOLN
OPHTHALMIC | Status: AC
  Filled 2019-08-01: qty 2

## 2019-08-01 MED ORDER — LIDOCAINE HCL (PF) 1 % IJ SOLN
INTRAMUSCULAR | Status: AC
  Filled 2019-08-01: qty 2

## 2019-08-01 MED ORDER — NEOMYCIN-POLYMYXIN-DEXAMETH 3.5-10000-0.1 OP SUSP
OPHTHALMIC | Status: AC
  Filled 2019-08-01: qty 5

## 2019-08-01 MED ORDER — TETRACAINE HCL 0.5 % OP SOLN
OPHTHALMIC | Status: AC
  Filled 2019-08-01: qty 4

## 2019-08-01 MED ORDER — LACTATED RINGERS IV SOLN: INTRAVENOUS | Status: DC

## 2019-08-01 MED ORDER — LIDOCAINE HCL 3.5 % OP GEL
OPHTHALMIC | Status: AC
  Filled 2019-08-01: qty 1

## 2019-08-02 LAB — SARS CORONAVIRUS 2 (TAT 6-24 HRS): SARS Coronavirus 2: NEGATIVE

## 2019-08-04 ENCOUNTER — Encounter (HOSPITAL_COMMUNITY)
Admission: RE | Disposition: A | Discharge status: Home / Self Care | Payer: Self-pay | Source: Home / Self Care | Attending: Ophthalmology

## 2019-08-04 ENCOUNTER — Ambulatory Visit (HOSPITAL_COMMUNITY): Payer: Medicare Other | Admitting: Anesthesiology

## 2019-08-04 ENCOUNTER — Encounter (HOSPITAL_COMMUNITY): Payer: Self-pay | Admitting: Ophthalmology

## 2019-08-04 ENCOUNTER — Ambulatory Visit (HOSPITAL_COMMUNITY)
Admission: RE | Admit: 2019-08-04 | Discharge: 2019-08-04 | Disposition: A | Discharge status: Home / Self Care | Payer: Medicare Other | Attending: Ophthalmology | Admitting: Ophthalmology

## 2019-08-04 DIAGNOSIS — Z7982 Long term (current) use of aspirin: Secondary | ICD-10-CM | POA: Diagnosis not present

## 2019-08-04 DIAGNOSIS — K219 Gastro-esophageal reflux disease without esophagitis: Secondary | ICD-10-CM | POA: Insufficient documentation

## 2019-08-04 DIAGNOSIS — I252 Old myocardial infarction: Secondary | ICD-10-CM | POA: Diagnosis not present

## 2019-08-04 DIAGNOSIS — Z7984 Long term (current) use of oral hypoglycemic drugs: Secondary | ICD-10-CM | POA: Diagnosis not present

## 2019-08-04 DIAGNOSIS — I10 Essential (primary) hypertension: Secondary | ICD-10-CM | POA: Diagnosis not present

## 2019-08-04 DIAGNOSIS — Y838 Other surgical procedures as the cause of abnormal reaction of the patient, or of later complication, without mention of misadventure at the time of the procedure: Secondary | ICD-10-CM | POA: Insufficient documentation

## 2019-08-04 DIAGNOSIS — H5988 Other intraoperative complications of eye and adnexa, not elsewhere classified: Secondary | ICD-10-CM | POA: Diagnosis not present

## 2019-08-04 DIAGNOSIS — T8522XA Displacement of intraocular lens, initial encounter: Secondary | ICD-10-CM | POA: Diagnosis not present

## 2019-08-04 DIAGNOSIS — E1136 Type 2 diabetes mellitus with diabetic cataract: Secondary | ICD-10-CM | POA: Diagnosis not present

## 2019-08-04 DIAGNOSIS — Z79899 Other long term (current) drug therapy: Secondary | ICD-10-CM | POA: Insufficient documentation

## 2019-08-04 DIAGNOSIS — D649 Anemia, unspecified: Secondary | ICD-10-CM | POA: Insufficient documentation

## 2019-08-04 DIAGNOSIS — I2510 Atherosclerotic heart disease of native coronary artery without angina pectoris: Secondary | ICD-10-CM | POA: Insufficient documentation

## 2019-08-04 DIAGNOSIS — H25811 Combined forms of age-related cataract, right eye: Secondary | ICD-10-CM | POA: Insufficient documentation

## 2019-08-04 HISTORY — PX: CATARACT EXTRACTION W/PHACO: SHX586

## 2019-08-04 HISTORY — PX: ANTERIOR VITRECTOMY: SHX1173

## 2019-08-04 LAB — GLUCOSE, CAPILLARY: Glucose-Capillary: 149 mg/dL — ABNORMAL HIGH (ref 70–99)

## 2019-08-04 SURGERY — PHACOEMULSIFICATION, CATARACT, WITH IOL INSERTION
Anesthesia: Monitor Anesthesia Care | Site: Eye | Laterality: Right

## 2019-08-04 MED ORDER — PROVISC 10 MG/ML IO SOLN
INTRAOCULAR | Status: DC | PRN
  Administered 2019-08-04: 0.85 mL via INTRAOCULAR

## 2019-08-04 MED ORDER — BSS IO SOLN
INTRAOCULAR | Status: DC | PRN
  Administered 2019-08-04: 15 mL via INTRAOCULAR

## 2019-08-04 MED ORDER — POVIDONE-IODINE 5 % OP SOLN
OPHTHALMIC | Status: DC | PRN
  Administered 2019-08-04: 1 via OPHTHALMIC

## 2019-08-04 MED ORDER — TETRACAINE HCL 0.5 % OP SOLN
1.0000 [drp] | OPHTHALMIC | Status: AC | PRN
  Administered 2019-08-04 (×3): 1 [drp] via OPHTHALMIC

## 2019-08-04 MED ORDER — EPINEPHRINE PF 1 MG/ML IJ SOLN
INTRAOCULAR | Status: DC | PRN
  Administered 2019-08-04: 1000 mL

## 2019-08-04 MED ORDER — NEOMYCIN-POLYMYXIN-DEXAMETH 3.5-10000-0.1 OP SUSP
OPHTHALMIC | Status: DC | PRN
  Administered 2019-08-04: 1 [drp] via OPHTHALMIC

## 2019-08-04 MED ORDER — MIDAZOLAM HCL 2 MG/2ML IJ SOLN
INTRAMUSCULAR | Status: AC
  Filled 2019-08-04: qty 2

## 2019-08-04 MED ORDER — MIDAZOLAM HCL 5 MG/5ML IJ SOLN
INTRAMUSCULAR | Status: DC | PRN
  Administered 2019-08-04: 1 mg via INTRAVENOUS

## 2019-08-04 MED ORDER — PHENYLEPHRINE HCL 2.5 % OP SOLN
1.0000 [drp] | OPHTHALMIC | Status: AC | PRN
  Administered 2019-08-04 (×3): 1 [drp] via OPHTHALMIC

## 2019-08-04 MED ORDER — EPINEPHRINE PF 1 MG/ML IJ SOLN
INTRAMUSCULAR | Status: AC
  Filled 2019-08-04: qty 2

## 2019-08-04 MED ORDER — SODIUM HYALURONATE 23 MG/ML IO SOLN
INTRAOCULAR | Status: DC | PRN
  Administered 2019-08-04: 0.6 mL via INTRAOCULAR

## 2019-08-04 MED ORDER — LIDOCAINE HCL 3.5 % OP GEL
1.0000 "application " | Freq: Once | OPHTHALMIC | Status: AC
  Administered 2019-08-04: 1 via OPHTHALMIC

## 2019-08-04 MED ORDER — LIDOCAINE HCL (PF) 1 % IJ SOLN
INTRAOCULAR | Status: DC | PRN
  Administered 2019-08-04: 1 mL via OPHTHALMIC

## 2019-08-04 MED ORDER — CYCLOPENTOLATE-PHENYLEPHRINE 0.2-1 % OP SOLN
1.0000 [drp] | OPHTHALMIC | Status: AC | PRN
  Administered 2019-08-04 (×3): 1 [drp] via OPHTHALMIC

## 2019-08-04 SURGICAL SUPPLY — 13 items
CLOTH BEACON ORANGE TIMEOUT ST (SAFETY) ×2 IMPLANT
EYE SHIELD UNIVERSAL CLEAR (GAUZE/BANDAGES/DRESSINGS) ×2 IMPLANT
GLOVE BIOGEL PI IND STRL 7.0 (GLOVE) ×2 IMPLANT
GLOVE BIOGEL PI INDICATOR 7.0 (GLOVE) ×2
LENS ALC ACRYL/TECN (Ophthalmic Related) ×2 IMPLANT
NEEDLE HYPO 18GX1.5 BLUNT FILL (NEEDLE) ×2 IMPLANT
PACK VIT ANT 23G (MISCELLANEOUS) ×2 IMPLANT
PAD ARMBOARD 7.5X6 YLW CONV (MISCELLANEOUS) ×2 IMPLANT
SYR TB 1ML LL NO SAFETY (SYRINGE) ×2 IMPLANT
TAPE SURG TRANSPORE 1 IN (GAUZE/BANDAGES/DRESSINGS) ×1 IMPLANT
TAPE SURGICAL TRANSPORE 1 IN (GAUZE/BANDAGES/DRESSINGS) ×1
VISCOELASTIC ADDITIONAL (OPHTHALMIC RELATED) ×2 IMPLANT
WATER STERILE IRR 250ML POUR (IV SOLUTION) ×2 IMPLANT

## 2019-08-04 NOTE — Addendum Note (Signed)
Addendum  created 08/04/19 0844 by Alvy Bimler, CRNA   Attestation recorded in [place], [place] filed, Dance movement psychotherapist edited

## 2019-08-04 NOTE — Anesthesia Preprocedure Evaluation (Signed)
Anesthesia Evaluation  Patient identified by MRN, date of birth, ID band Patient awake    Reviewed: Allergy & Precautions, H&P , NPO status , Patient's Chart, lab work & pertinent test results, reviewed documented beta blocker date and time   Airway Mallampati: III  TM Distance: >3 FB     Dental no notable dental hx.    Pulmonary neg pulmonary ROS,    Pulmonary exam normal        Cardiovascular hypertension, + CAD and + Past MI  Normal cardiovascular exam     Neuro/Psych negative neurological ROS  negative psych ROS   GI/Hepatic Neg liver ROS, GERD  ,  Endo/Other  diabetes  Renal/GU   negative genitourinary   Musculoskeletal   Abdominal   Peds  Hematology  (+) Blood dyscrasia, anemia ,   Anesthesia Other Findings   Reproductive/Obstetrics negative OB ROS                             Anesthesia Physical Anesthesia Plan  ASA: III  Anesthesia Plan: MAC   Post-op Pain Management:    Induction:   PONV Risk Score and Plan: 2 and Ondansetron  Airway Management Planned:   Additional Equipment:   Intra-op Plan:   Post-operative Plan:   Informed Consent: I have reviewed the patients History and Physical, chart, labs and discussed the procedure including the risks, benefits and alternatives for the proposed anesthesia with the patient or authorized representative who has indicated his/her understanding and acceptance.       Plan Discussed with: Anesthesiologist  Anesthesia Plan Comments:         Anesthesia Quick Evaluation

## 2019-08-04 NOTE — Interval H&P Note (Signed)
History and Physical Interval Note: The H and P was reviewed and updated. The patient was examined.  No changes were found after exam.  The surgical eye was marked.  08/04/2019 7:52 AM  Sandra Schneider  has presented today for surgery, with the diagnosis of Nuclear sclerotic cataract - Right eye.  The various methods of treatment have been discussed with the patient and family. After consideration of risks, benefits and other options for treatment, the patient has consented to  Procedure(s) with comments: CATARACT EXTRACTION PHACO AND INTRAOCULAR LENS PLACEMENT (IOC) (Right) - right as a surgical intervention.  The patient's history has been reviewed, patient examined, no change in status, stable for surgery.  I have reviewed the patient's chart and labs.  Questions were answered to the patient's satisfaction.     Baruch Goldmann

## 2019-08-04 NOTE — Discharge Instructions (Signed)
Please discharge patient when stable, will follow up today with Dr. Graeme Menees at the [facility] [place] office immediately following discharge.  Leave shield in place until visit.  All paperwork with discharge instructions will be given at the office.  [facility] Address:  [address]  [place], [place] [address]  

## 2019-08-04 NOTE — Transfer of Care (Signed)
Immediate Anesthesia Transfer of Care Note  Patient: Sandra Schneider  Procedure(s) Performed: CATARACT EXTRACTION PHACO AND INTRAOCULAR LENS PLACEMENT (IOC) (Right Eye) ANTERIOR VITRECTOMY (Right Eye)  Patient Location: PACU  Anesthesia Type:MAC  Level of Consciousness: awake, alert , oriented and patient cooperative  Airway & Oxygen Therapy: Patient Spontanous Breathing  Post-op Assessment: Report given to RN, Post -op Vital signs reviewed and stable and Patient moving all extremities X 4  Post vital signs: Reviewed and stable  Last Vitals:  Vitals Value Taken Time  BP    Temp    Pulse    Resp    SpO2      Last Pain:  Vitals:   08/04/19 0700  PainSc: 0-No pain         Complications: No apparent anesthesia complications

## 2019-08-04 NOTE — Anesthesia Postprocedure Evaluation (Signed)
Anesthesia Post Note  Patient: DAISEY CALOCA  Procedure(s) Performed: CATARACT EXTRACTION PHACO AND INTRAOCULAR LENS PLACEMENT (IOC) (Right Eye) ANTERIOR VITRECTOMY (Right Eye)  Patient location during evaluation: PACU Anesthesia Type: MAC Level of consciousness: awake and alert Pain management: pain level controlled Vital Signs Assessment: post-procedure vital signs reviewed and stable Respiratory status: spontaneous breathing, nonlabored ventilation, respiratory function stable and patient connected to nasal cannula oxygen Cardiovascular status: stable and blood pressure returned to baseline Postop Assessment: no apparent nausea or vomiting Anesthetic complications: no     Last Vitals:  Vitals:   08/04/19 0700 08/04/19 0715  BP: (!) 127/40 (!) 165/65  Pulse: 60 60  Resp: 18 16  Temp: 36.9 C   SpO2: 95% 97%    Last Pain:  Vitals:   08/04/19 0700  PainSc: 0-No pain                 Sherian Valenza

## 2019-08-04 NOTE — Op Note (Signed)
Date of procedure: 08/04/19  Pre-operative diagnosis:  Visually significant combined form age-related cataract, Right Eye (H25.811)  Post-operative diagnosis:  Visually significant combined form age-related cataract, Right Eye (H25.811); Posterior capsule tear, Right Eye, Dislocated IOL to vitreous, Right Eye  Procedure: Removal of cataract via phacoemulsification and insertion of intra-ocular lens Wynetta Emery and [place]  +14.0D into the Right Eye  Attending surgeon: Gerda Diss. Marisa Hua, MD, MA  Anesthesia: MAC, Topical Akten  Complications: Posterior capsule tear requiring vitrectomy, dislocated IOL to vitreous, right eye  Estimated Blood Loss: <70m (minimal)  Specimens: None  Implants: As above  Indications:  Visually significant age-related cataract, Right Eye  Procedure:  The patient was seen and identified in the pre-operative area. The operative eye was identified and dilated.  The operative eye was marked.  Topical anesthesia was administered to the operative eye.     The patient was then to the operative suite and placed in the supine position.  A timeout was performed confirming the patient, procedure to be performed, and all other relevant information.   The patient's face was prepped and draped in the usual fashion for intra-ocular surgery.  A lid speculum was placed into the operative eye and the surgical microscope moved into place and focused.  A superotemporal paracentesis was created using a 20 gauge paracentesis blade.  Shugarcaine was injected into the anterior chamber.  Viscoelastic was injected into the anterior chamber.  A temporal clear-corneal main wound incision was created using a 2.44mmicrokeratome.  A continuous curvilinear capsulorrhexis was initiated using an irrigating cystitome and completed using capsulorrhexis forceps.  Hydrodissection and hydrodeliniation were performed.  Viscoelastic was injected into the anterior chamber.  A phacoemulsification  handpiece and a chopper as a second instrument were used to remove the nucleus and epinucleus. The irrigation/aspiration handpiece was used to remove any remaining cortical material.   The capsular bag was reinflated with viscoelastic, checked, and found to be intact.  The intraocular lens was inserted into the capsular bag. At this time, a large posterior capsul tear developed and the IOL descended into the vitreous.  Provisc was placed in the eye.  An anterior vitrectomy was performed.  The clear corneal wound and paracentesis wounds were then hydrated and checked with Weck-Cels to be watertight.  The lid-speculum and drape was removed, and the patient's face was cleaned with a wet and dry 4x4.  Maxitrol was instilled in the eye before a clear shield was taped over the eye. The patient was taken to the post-operative care unit in good condition, having tolerated the procedure well.  All details of the complication were discussed with the patient.  Post-Op Instructions: [facility] and will receive all other orders and instructions.  Will refer to [facility] Service ASAP for vitrectomy, IOL explantation, secondary IOL placement.

## 2019-09-10 ENCOUNTER — Other Ambulatory Visit (HOSPITAL_COMMUNITY): Payer: Self-pay | Admitting: General Practice

## 2019-09-10 DIAGNOSIS — Z1231 Encounter for screening mammogram for malignant neoplasm of breast: Secondary | ICD-10-CM

## 2019-10-06 ENCOUNTER — Other Ambulatory Visit: Payer: Self-pay

## 2019-10-06 ENCOUNTER — Ambulatory Visit (HOSPITAL_COMMUNITY)
Admission: RE | Admit: 2019-10-06 | Discharge: 2019-10-06 | Disposition: A | Discharge status: Home / Self Care | Payer: Medicare Other | Source: Ambulatory Visit | Attending: General Practice | Admitting: General Practice

## 2019-10-06 DIAGNOSIS — Z1231 Encounter for screening mammogram for malignant neoplasm of breast: Secondary | ICD-10-CM | POA: Diagnosis not present

## 2019-10-07 ENCOUNTER — Encounter: Payer: Self-pay | Admitting: *Deleted

## 2019-10-07 ENCOUNTER — Ambulatory Visit (INDEPENDENT_AMBULATORY_CARE_PROVIDER_SITE_OTHER): Payer: Medicare Other | Admitting: Cardiology

## 2019-10-07 ENCOUNTER — Encounter: Payer: Self-pay | Admitting: Cardiology

## 2019-10-07 VITALS — BP 140/72 | HR 65 | Ht 65.5 in | Wt 296.6 lb

## 2019-10-07 DIAGNOSIS — E785 Hyperlipidemia, unspecified: Secondary | ICD-10-CM | POA: Diagnosis not present

## 2019-10-07 DIAGNOSIS — R0989 Other specified symptoms and signs involving the circulatory and respiratory systems: Secondary | ICD-10-CM | POA: Diagnosis not present

## 2019-10-07 DIAGNOSIS — I10 Essential (primary) hypertension: Secondary | ICD-10-CM

## 2019-10-07 DIAGNOSIS — I2510 Atherosclerotic heart disease of native coronary artery without angina pectoris: Secondary | ICD-10-CM | POA: Diagnosis not present

## 2019-10-07 MED ORDER — CHLORTHALIDONE 25 MG PO TABS: 25.0000 mg | ORAL_TABLET | Freq: Every day | ORAL | 1 refills | Status: DC

## 2019-10-07 MED ORDER — ATORVASTATIN CALCIUM 40 MG PO TABS: 40.0000 mg | ORAL_TABLET | Freq: Every day | ORAL | 1 refills | Status: DC

## 2019-10-07 MED ORDER — LISINOPRIL 40 MG PO TABS: 40.0000 mg | ORAL_TABLET | Freq: Every day | ORAL | 1 refills | Status: DC

## 2019-10-07 NOTE — Patient Instructions (Addendum)
Your physician recommends that you schedule a follow-up appointment in: [place] has recommended you make the following change in your medication:   STOP SIMVASTATIN   START ATORVASTATIN 40 MG DAILY   STOP LISINOPRIL/HCTZ  START LISINOPRIL 40 MG DAILY  START CHLORTHALIDONE 25 MG DAILY   Your physician recommends that you return for lab work in: BMP/MG/LIPIDS - PLEASE FAST 6-8 [place] TO LAB WORK  Your physician has requested that you have a carotid duplex. This test is an ultrasound of the carotid arteries in your neck. It looks at blood flow through these arteries that supply the brain with blood. Allow one hour for this exam. There are no restrictions or special instructions.  Thank you for choosing [facility]!!

## 2019-10-07 NOTE — Addendum Note (Signed)
Addended by: Julian Hy T on: 10/07/2019 11:48 AM   Modules accepted: Orders

## 2019-10-07 NOTE — Progress Notes (Signed)
Clinical Summary Ms. Graetz is a 64 y.o.female seen today as a new consult, referred by NP Dekoninck for the following medical problems.   1. CAD - history of NSTEMI 06/2012, received DES to prox LAD and DES to mid LCX - 06/2012 echo LVEF 55-60%, grade I DDx  - limited follow up, looks like seen by [facility] cardiology once in 2017. Had stress test, results not available.  - no recent chest pain. No SOB or DOE - compliant with meds   2. HTN - compliant with meds - home SBPs 140s   3. Hyperlipidemia - on simvastatin 20mg  daily.  - was on atorva 80mg  in 2014 after her MI, at [facility] f/u 2017 was on atorva 80mg ,. Unclear when changed to simva, she denies any side effects      SH: son is Randa Ngo, patient here Past Medical History:  Diagnosis Date  . Coronary artery disease   . Diabetes mellitus   . GERD (gastroesophageal reflux disease)   . Hypertension   . Myocardial abscess   . Myocardial infarct ([place]) 06/24/2012     No Known Allergies   Current Outpatient Medications  Medication Sig Dispense Refill  . aspirin EC 81 MG tablet Take 81 mg by mouth daily.    Marland Kitchen atorvastatin (LIPITOR) 80 MG tablet Take 1 tablet (80 mg total) by mouth daily at 6 PM. 30 tablet 3  . cetirizine (ZYRTEC) 10 MG tablet Take 10 mg by mouth daily.    . famotidine (PEPCID) 40 MG tablet Take 1 tablet (40 mg total) by mouth daily. 20 tablet 0  . glipiZIDE (GLUCOTROL) 10 MG tablet Take 10 mg by mouth 2 (two) times daily before a meal.    . lisinopril-hydrochlorothiazide (ZESTORETIC) 20-12.5 MG tablet Take 2 tablets by mouth daily.     . metoprolol tartrate (LOPRESSOR) 25 MG tablet Take 1 tablet (25 mg total) by mouth 2 (two) times daily. 60 tablet 3  . NITROSTAT 0.4 MG SL tablet DISSOLVE ONE TABLET UNDER THE TONGUE EVERY 5 MINUTES AS NEEDED FOR CHEST PAIN.  DO NOT EXCEED A TOTAL OF 3 DOSES IN 15 MINUTES (Patient taking differently: Place 0.4 mg under the tongue every 5 (five) minutes as needed  for chest pain. ) 25 tablet 4  . Prenatal Vit-Fe Fumarate-FA (PRENATAL MULTIVITAMIN) TABS Take 1 tablet by mouth daily.    . simvastatin (ZOCOR) 20 MG tablet Take 20 mg by mouth at bedtime.    . sitaGLIPtin-metformin (JANUMET) 50-1000 MG tablet Take 1 tablet by mouth 2 (two) times daily with a meal.     No current facility-administered medications for this visit.     Past Surgical History:  Procedure Laterality Date  . ANTERIOR VITRECTOMY Left 07/21/2019   Procedure: ANTERIOR VITRECTOMY;  Surgeon: Baruch Goldmann, MD;  Location: [facility];  Service: Ophthalmology;  Laterality: Left;  . ANTERIOR VITRECTOMY Right 08/04/2019   Procedure: ANTERIOR VITRECTOMY;  Surgeon: Baruch Goldmann, MD;  Location: [facility];  Service: Ophthalmology;  Laterality: Right;  . CARDIAC CATHETERIZATION    . CATARACT EXTRACTION W/PHACO Left 07/21/2019   Procedure: CATARACT EXTRACTION PHACO AND INTRAOCULAR LENS PLACEMENT (IOC) (CDE: 11.45);  Surgeon: Baruch Goldmann, MD;  Location: [facility];  Service: Ophthalmology;  Laterality: Left;  . CATARACT EXTRACTION W/PHACO Right 08/04/2019   Procedure: CATARACT EXTRACTION PHACO AND INTRAOCULAR LENS PLACEMENT (IOC);  Surgeon: Baruch Goldmann, MD;  Location: [facility];  Service: Ophthalmology;  Laterality: Right;  CDE: 9.78  . CESAREAN SECTION    .  CORONARY STENT PLACEMENT N/A 06/24/2012  . LEFT HEART CATHETERIZATION WITH CORONARY ANGIOGRAM N/A 06/24/2012   Procedure: LEFT HEART CATHETERIZATION WITH CORONARY ANGIOGRAM;  Surgeon: Jolaine Artist, MD;  Location: [facility] C[facility] LAB;  Service: Cardiovascular;  Laterality: N/A;     No Known Allergies    Family History  Problem Relation Age of Onset  . Heart disease Father   . Diabetes Other   . Hypertension Other   . Cancer Other      Social History Ms. Steinruck reports that she has never smoked. She has never used smokeless tobacco. Ms. Jackel reports no history of alcohol use.   Review of Systems CONSTITUTIONAL: No weight loss,  fever, chills, weakness or fatigue.  HEENT: Eyes: No visual loss, blurred vision, double vision or yellow sclerae.No hearing loss, sneezing, congestion, runny nose or sore throat.  SKIN: No rash or itching.  CARDIOVASCULAR: per hpi RESPIRATORY: No shortness of breath, cough or sputum.  GASTROINTESTINAL: No anorexia, nausea, vomiting or diarrhea. No abdominal pain or blood.  GENITOURINARY: No burning on urination, no polyuria NEUROLOGICAL: No headache, dizziness, syncope, paralysis, ataxia, numbness or tingling in the extremities. No change in bowel or bladder control.  MUSCULOSKELETAL: No muscle, back pain, joint pain or stiffness.  LYMPHATICS: No enlarged nodes. No history of splenectomy.  PSYCHIATRIC: No history of depression or anxiety.  ENDOCRINOLOGIC: No reports of sweating, cold or heat intolerance. No polyuria or polydipsia.  Marland Kitchen   Physical Examination Today's Vitals   10/07/19 1037  BP: 140/72  Pulse: 65  SpO2: 93%  Weight: 296 lb 9.6 oz (134.5 kg)  Height: 5' 5.5" (1.664 m)   Body mass index is 48.61 kg/m.  Gen: resting comfortably, no acute distress HEENT: no scleral icterus, pupils equal round and reactive, no palptable cervical adenopathy,  CV: RRR, no m/r/g, no jvd. Left carotid bruit Resp: Clear to auscultation bilaterally GI: abdomen is soft, non-tender, non-distended, normal bowel sounds, no hepatosplenomegaly MSK: extremities are warm, no edema.  Skin: warm, no rash Neuro:  no focal deficits Psych: appropriate affect   Diagnostic Studies  06/2012 echo Study Conclusions   - Left ventricle: The cavity size was normal. Wall thickness  was normal. Systolic function was normal. The estimated  ejection fraction was in the range of 55% to 60%. Wall  motion was normal; there were no regional wall motion  abnormalities. Doppler parameters are consistent with  abnormal left ventricular relaxation (grade 1 diastolic  dysfunction). Doppler parameters are  consistent with high  ventricular filling pressure.  - Mitral valve: Calcified annulus.  - Left atrium: The atrium was mildly dilated.  - Atrial septum: There was increased thickness of the  septum, consistent with lipomatous hypertrophy.    06/2012 cath   Findings:  Ao Pressure: 125/59 (83) LV Pressure:  137/11/20 There was no signficant gradient across the aortic valve on pullback.  Left main: Short. Normal  LAD: Large vessel with proximal 80% lesion. Mid vessel 30%. Distal vessel has diffuse moderate disease.   LCX: Large vessel gives off 3 OMs. 99% midsection followed by 40%. Distal vessel is small with 70% lesion.   RCA: Dominant. Normal.   LV-gram done in the RAO projection: Ejection fraction = 60%. No regional wall motion abnormalities.   Assessment: 1. 2V CAD 2. Normal LV function 3. NSTEMI  Plan/Discussion:  Plan PCI of LCX & LAD.   PCI Data: Vessel - proximal and mid left circumflex/Segment - proximal and mid Percent Stenosis (pre)  95% TIMI-flow 3  Stent 3.0 x 32 mm Promus drug-eluting stent postdilated with a 3.5 noncompliant balloon. Percent Stenosis (post) 0% TIMI-flow (post) 3   Vessel - LAD/Segment - proximal  Percent Stenosis (pre)  85% TIMI-flow 3 Stent 3.0 x 20 mm Promus drug-eluting stent postdilated with a 3.5 noncompliant balloon. Percent Stenosis (post) 0% TIMI-flow (post) 3  Final Conclusions:   Successful angioplasty and drug-eluting stent placement to the proximal/mid left circumflex as well as proximal left anterior descending artery.   Recommendations:  Recommend dual antiplatelet therapy for at least one year. Aggressive treatment of risk factors is recommended.  Assessment and Plan  1. CAD - no recent symptoms, continue current meds -= EKG today shows SR, no ischemic changes  2. HTN - not at goal - d/c lisinopril hctz combo.  - start lisinopril 40mg , chlorthalidone 25mg  daily - check BMET/Mg in 2  weeks  3. Hyperlipidemia - in setting of CAD guideliens would favor atorva or crestor. D/c simva, start atorva 40mg  daily - repeat lipid panel   4. Carotid bruit - obtain carotid US    F/u 3 months   Arnoldo Lenis, M.D.

## 2019-10-15 ENCOUNTER — Other Ambulatory Visit (HOSPITAL_COMMUNITY): Payer: Self-pay | Admitting: General Practice

## 2019-10-15 DIAGNOSIS — M25552 Pain in left hip: Secondary | ICD-10-CM

## 2019-10-15 DIAGNOSIS — R928 Other abnormal and inconclusive findings on diagnostic imaging of breast: Secondary | ICD-10-CM

## 2019-10-22 ENCOUNTER — Ambulatory Visit (INDEPENDENT_AMBULATORY_CARE_PROVIDER_SITE_OTHER): Payer: Medicare Other | Admitting: Vascular Surgery

## 2019-10-22 ENCOUNTER — Other Ambulatory Visit: Payer: Self-pay

## 2019-10-22 ENCOUNTER — Encounter: Payer: Self-pay | Admitting: Vascular Surgery

## 2019-10-22 VITALS — BP 142/60 | HR 65 | Temp 97.2°F | Resp 16 | Ht 65.5 in | Wt 296.0 lb

## 2019-10-22 DIAGNOSIS — I83812 Varicose veins of left lower extremities with pain: Secondary | ICD-10-CM

## 2019-10-22 NOTE — Progress Notes (Signed)
Referring Physician: Leda Gauze  Patient name: Sandra Schneider MRN: [id number] DOB: 26-Dec-1955 Sex: female  REASON FOR CONSULT: Varicose veins  HPI: Sandra Schneider is a 64 y.o. female, with varicose veins had acute accumulated around her left knee area.  She has never really had any pain from this.  She has no fullness heaviness or aching in her leg.  She has no prior ulcerations.  She has no prior DVT.  She does have a known history of coronary artery disease.  She also had a recent carotid bruit that is scheduled to be investigated with a carotid duplex exam in the near future.  She has no prior history of stroke.  Other medical problems include diabetes reflux hypertension all of which are currently stable.  Past Medical History:  Diagnosis Date  . Coronary artery disease   . Diabetes mellitus   . GERD (gastroesophageal reflux disease)   . Hypertension   . Myocardial abscess   . Myocardial infarct [facility]) 06/24/2012   Past Surgical History:  Procedure Laterality Date  . ANTERIOR VITRECTOMY Left 07/21/2019   Procedure: ANTERIOR VITRECTOMY;  Surgeon: Baruch Goldmann, MD;  Location: [facility];  Service: Ophthalmology;  Laterality: Left;  . ANTERIOR VITRECTOMY Right 08/04/2019   Procedure: ANTERIOR VITRECTOMY;  Surgeon: Baruch Goldmann, MD;  Location: [facility];  Service: Ophthalmology;  Laterality: Right;  . CARDIAC CATHETERIZATION    . CATARACT EXTRACTION W/PHACO Left 07/21/2019   Procedure: CATARACT EXTRACTION PHACO AND INTRAOCULAR LENS PLACEMENT (IOC) (CDE: 11.45);  Surgeon: Baruch Goldmann, MD;  Location: [facility];  Service: Ophthalmology;  Laterality: Left;  . CATARACT EXTRACTION W/PHACO Right 08/04/2019   Procedure: CATARACT EXTRACTION PHACO AND INTRAOCULAR LENS PLACEMENT (IOC);  Surgeon: Baruch Goldmann, MD;  Location: [facility];  Service: Ophthalmology;  Laterality: Right;  CDE: 9.78  . CESAREAN SECTION    . CORONARY STENT PLACEMENT N/A 06/24/2012  . LEFT HEART CATHETERIZATION WITH  CORONARY ANGIOGRAM N/A 06/24/2012   Procedure: LEFT HEART CATHETERIZATION WITH CORONARY ANGIOGRAM;  Surgeon: Jolaine Artist, MD;  Location: [facility] CATH LAB;  Service: Cardiovascular;  Laterality: N/A;    Family History  Problem Relation Age of Onset  . Heart disease Father   . Diabetes Other   . Hypertension Other   . Cancer Other     SOCIAL HISTORY: Social History   Socioeconomic History  . Marital status: Widowed    Spouse name: Not on file  . Number of children: Not on file  . Years of education: Not on file  . Highest education level: Not on file  Occupational History  . Not on file  Tobacco Use  . Smoking status: Never Smoker  . Smokeless tobacco: Never Used  Substance and Sexual Activity  . Alcohol use: No  . Drug use: No  . Sexual activity: Yes    Birth control/protection: Post-menopausal  Other Topics Concern  . Not on file  Social History Narrative  . Not on file   Social Determinants of Health   Financial Resource Strain:   . Difficulty of Paying Living Expenses:   Food Insecurity:   . Worried About Charity fundraiser in the Last Year:   . Arboriculturist in the Last Year:   Transportation Needs:   . Film/video editor (Medical):   Marland Kitchen Lack of Transportation (Non-Medical):   Physical Activity:   . Days of Exercise per Week:   . Minutes of Exercise per Session:   Stress:   .  Feeling of Stress :   Social Connections:   . Frequency of Communication with Friends and Family:   . Frequency of Social Gatherings with Friends and Family:   . Attends Religious Services:   . Active Member of Clubs or Organizations:   . Attends Archivist Meetings:   Marland Kitchen Marital Status:   Intimate Partner Violence:   . Fear of Current or Ex-Partner:   . Emotionally Abused:   Marland Kitchen Physically Abused:   . Sexually Abused:     No Known Allergies  Current Outpatient Medications  Medication Sig Dispense Refill  . aspirin EC 81 MG tablet Take 81 mg by mouth daily.      Marland Kitchen atorvastatin (LIPITOR) 40 MG tablet Take 1 tablet (40 mg total) by mouth daily. 90 tablet 1  . cetirizine (ZYRTEC) 10 MG tablet Take 10 mg by mouth daily.    . chlorthalidone (HYGROTON) 25 MG tablet Take 1 tablet (25 mg total) by mouth daily. 90 tablet 1  . dorzolamide-timolol (COSOPT) 22.3-6.8 MG/ML ophthalmic solution Place 1 drop into both eyes 2 (two) times daily.    . famotidine (PEPCID) 40 MG tablet Take 1 tablet (40 mg total) by mouth daily. 20 tablet 0  . glipiZIDE (GLUCOTROL) 10 MG tablet Take 10 mg by mouth 2 (two) times daily before a meal.    . lisinopril (ZESTRIL) 40 MG tablet Take 1 tablet (40 mg total) by mouth daily. 90 tablet 1  . metoprolol tartrate (LOPRESSOR) 25 MG tablet Take 1 tablet (25 mg total) by mouth 2 (two) times daily. 60 tablet 3  . Prenatal Vit-Fe Fumarate-FA (PRENATAL MULTIVITAMIN) TABS Take 1 tablet by mouth daily.    . sitaGLIPtin-metformin (JANUMET) 50-1000 MG tablet Take 1 tablet by mouth 2 (two) times daily with a meal.     No current facility-administered medications for this visit.    ROS:   General:  No weight loss, Fever, chills  HEENT: No recent headaches, no nasal bleeding, no visual changes, no sore throat  Neurologic: No dizziness, blackouts, seizures. No recent symptoms of stroke or mini- stroke. No recent episodes of slurred speech, or temporary blindness.  Cardiac: No recent episodes of chest pain/pressure, no shortness of breath at rest.  No shortness of breath with exertion.  Denies history of atrial fibrillation or irregular heartbeat  Vascular: No history of rest pain in feet.  No history of claudication.  No history of non-healing ulcer, No history of DVT   Pulmonary: No home oxygen, no productive cough, no hemoptysis,  No asthma or wheezing  Musculoskeletal:  [ ]  Arthritis, [ ]  Low back pain,  [ ]  Joint pain  Hematologic:No history of hypercoagulable state.  No history of easy bleeding.  No history of anemia  Gastrointestinal:  No hematochezia or melena,  No gastroesophageal reflux, no trouble swallowing  Urinary: [ ]  chronic Kidney disease, [ ]  on HD - [ ]  MWF or [ ]  TTHS, [ ]  Burning with urination, [ ]  Frequent urination, [ ]  Difficulty urinating;   Skin: No rashes  Psychological: No history of anxiety,  No history of depression   Physical Examination  Vitals:   10/22/19 1408  BP: (!) 142/60  Pulse: 65  Resp: 16  Temp: (!) 97.2 F (36.2 C)  TempSrc: Temporal  SpO2: 96%  Weight: 296 lb (134.3 kg)  Height: 5' 5.5" (1.664 m)    Body mass index is 48.51 kg/m.  General:  Alert and oriented, no acute distress HEENT: Normal Neck:  No JVD Cardiac: Regular Rate and Rhythm Skin: No rash, scattered varicosities clustered around the left medial knee surface area about 7 cm.  Nontender to touch Extremity Pulses:  2+ absent dorsalis pedis, posterior tibial pulses bilaterally Musculoskeletal: No deformity or edema  Neurologic: Upper and lower extremity motor 5/5 and symmetric  ASSESSMENT: Patient with asymptomatic varicose veins left leg.   PLAN: Discussed with patient using compression stockings to prevent skin breakdown over time and worsening of her varicose veins.  If she starts to develop symptoms in her left leg we could consider a duplex ultrasound to assess for reflux and potentially laser ablation.  However, today she mainly just wanted reassurance and I did inform her that she is not at risk of DVT pulmonary embolus or limb loss secondary to these varicose veins.  She was reassured by this.  She will follow-up with [place] on an as-needed basis.   Ruta Hinds, MD [facility] Office: [phone] Pager: [phone]

## 2019-10-28 ENCOUNTER — Other Ambulatory Visit (HOSPITAL_COMMUNITY): Payer: Self-pay | Admitting: General Practice

## 2019-10-28 ENCOUNTER — Ambulatory Visit (HOSPITAL_COMMUNITY)
Admission: RE | Admit: 2019-10-28 | Discharge: 2019-10-28 | Disposition: A | Discharge status: Home / Self Care | Payer: Medicare Other | Source: Ambulatory Visit | Attending: General Practice | Admitting: General Practice

## 2019-10-28 ENCOUNTER — Other Ambulatory Visit: Payer: Self-pay

## 2019-10-28 DIAGNOSIS — R928 Other abnormal and inconclusive findings on diagnostic imaging of breast: Secondary | ICD-10-CM

## 2019-10-30 ENCOUNTER — Ambulatory Visit (INDEPENDENT_AMBULATORY_CARE_PROVIDER_SITE_OTHER): Payer: Medicare Other

## 2019-10-30 DIAGNOSIS — R0989 Other specified symptoms and signs involving the circulatory and respiratory systems: Secondary | ICD-10-CM | POA: Diagnosis not present

## 2019-11-04 ENCOUNTER — Ambulatory Visit (HOSPITAL_COMMUNITY)
Admission: RE | Admit: 2019-11-04 | Discharge: 2019-11-04 | Disposition: A | Discharge status: Home / Self Care | Payer: Medicare Other | Source: Ambulatory Visit | Attending: General Practice | Admitting: General Practice

## 2019-11-04 ENCOUNTER — Other Ambulatory Visit (HOSPITAL_COMMUNITY): Payer: Self-pay | Admitting: General Practice

## 2019-11-04 ENCOUNTER — Other Ambulatory Visit: Payer: Self-pay

## 2019-11-04 DIAGNOSIS — C50411 Malignant neoplasm of upper-outer quadrant of right female breast: Secondary | ICD-10-CM | POA: Diagnosis not present

## 2019-11-04 DIAGNOSIS — R928 Other abnormal and inconclusive findings on diagnostic imaging of breast: Secondary | ICD-10-CM

## 2019-11-04 MED ORDER — LIDOCAINE-EPINEPHRINE (PF) 1 %-1:200000 IJ SOLN
INTRAMUSCULAR | Status: AC
  Filled 2019-11-04: qty 30

## 2019-11-04 MED ORDER — LIDOCAINE HCL (PF) 2 % IJ SOLN
INTRAMUSCULAR | Status: AC
  Filled 2019-11-04: qty 10

## 2019-11-07 ENCOUNTER — Telehealth: Payer: Self-pay | Admitting: *Deleted

## 2019-11-07 NOTE — Telephone Encounter (Signed)
Pt voiced understanding - routed to pcp  

## 2019-11-07 NOTE — Telephone Encounter (Signed)
-----   Message from Arnoldo Lenis, MD sent at 11/05/2019 12:41 PM EDT ----- Carotid US shows just mild blockages on both sides, just something to monitor at this time   J BrancH MD

## 2019-11-10 LAB — SURGICAL PATHOLOGY

## 2019-11-12 DIAGNOSIS — Z170 Estrogen receptor positive status [ER+]: Secondary | ICD-10-CM | POA: Insufficient documentation

## 2019-11-12 DIAGNOSIS — C50411 Malignant neoplasm of upper-outer quadrant of right female breast: Secondary | ICD-10-CM | POA: Insufficient documentation

## 2019-11-18 ENCOUNTER — Ambulatory Visit: Payer: Medicare Other | Admitting: General Surgery

## 2019-11-19 ENCOUNTER — Other Ambulatory Visit: Payer: Self-pay | Admitting: Surgery

## 2019-11-19 DIAGNOSIS — N6310 Unspecified lump in the right breast, unspecified quadrant: Secondary | ICD-10-CM

## 2019-11-25 ENCOUNTER — Other Ambulatory Visit: Payer: Self-pay | Admitting: Surgery

## 2019-11-25 ENCOUNTER — Ambulatory Visit
Admission: RE | Admit: 2019-11-25 | Discharge: 2019-11-25 | Disposition: A | Discharge status: Home / Self Care | Payer: Medicare Other | Source: Ambulatory Visit | Attending: Surgery | Admitting: Surgery

## 2019-11-25 ENCOUNTER — Other Ambulatory Visit: Payer: Self-pay

## 2019-11-25 DIAGNOSIS — N6310 Unspecified lump in the right breast, unspecified quadrant: Secondary | ICD-10-CM

## 2019-12-12 ENCOUNTER — Other Ambulatory Visit: Payer: Self-pay

## 2019-12-12 ENCOUNTER — Encounter: Payer: Self-pay | Admitting: Podiatry

## 2019-12-12 ENCOUNTER — Ambulatory Visit: Payer: Medicare Other | Admitting: Podiatry

## 2019-12-12 DIAGNOSIS — B351 Tinea unguium: Secondary | ICD-10-CM

## 2019-12-12 DIAGNOSIS — C50919 Malignant neoplasm of unspecified site of unspecified female breast: Secondary | ICD-10-CM | POA: Insufficient documentation

## 2019-12-12 DIAGNOSIS — M79675 Pain in left toe(s): Secondary | ICD-10-CM | POA: Diagnosis not present

## 2019-12-12 DIAGNOSIS — L608 Other nail disorders: Secondary | ICD-10-CM | POA: Diagnosis not present

## 2019-12-12 DIAGNOSIS — E1165 Type 2 diabetes mellitus with hyperglycemia: Secondary | ICD-10-CM | POA: Diagnosis not present

## 2019-12-12 DIAGNOSIS — M79674 Pain in right toe(s): Secondary | ICD-10-CM

## 2019-12-12 NOTE — Progress Notes (Signed)
This patient presents  to my office for at risk foot care.  This patient requires this care by a professional since this patient will be at risk due to having  Diabetes.  This patient is unable to cut nails herself since the patient cannot reach hiernails.These nails are painful walking and wearing shoes.  This patient presents for at risk foot care today.  General Appearance  Alert, conversant and in no acute stress.  Vascular  Dorsalis pedis  pulses are weakly  palpable  bilaterally. Posterior tibial pulses are absent  B/L. Capillary return is within normal limits  bilaterally. Temperature is within normal limits  bilaterally.  Neurologic  Senn-Weinstein monofilament wire test within normal limits  bilaterally. Muscle power within normal limits bilaterally.  Nails Thick disfigured discolored nails with subungual debris  from hallux to fifth toes bilaterally. No evidence of bacterial infection or drainage bilaterally.  Pincer nails noted.  Orthopedic  No limitations of motion  feet .  No crepitus or effusions noted.  No bony pathology or digital deformities noted.  Mild  HAV  B/L.  Skin  normotropic skin with no porokeratosis noted bilaterally.  No signs of infections or ulcers noted.     Onychomycosis  Pain in right toes  Pain in left toes  Consent was obtained for treatment procedures.   Mechanical debridement of nails 1-5  bilaterally performed with a nail nipper.  Filed with dremel without incident.    Return office visit    3 months                  Told patient to return for periodic foot care and evaluation due to potential at risk complications.   Ryenne Lynam DPM  

## 2019-12-17 DIAGNOSIS — Z170 Estrogen receptor positive status [ER+]: Secondary | ICD-10-CM | POA: Diagnosis not present

## 2019-12-17 DIAGNOSIS — Z809 Family history of malignant neoplasm, unspecified: Secondary | ICD-10-CM | POA: Diagnosis not present

## 2019-12-17 DIAGNOSIS — C50411 Malignant neoplasm of upper-outer quadrant of right female breast: Secondary | ICD-10-CM | POA: Diagnosis not present

## 2019-12-17 DIAGNOSIS — I2510 Atherosclerotic heart disease of native coronary artery without angina pectoris: Secondary | ICD-10-CM | POA: Diagnosis not present

## 2019-12-17 DIAGNOSIS — I252 Old myocardial infarction: Secondary | ICD-10-CM | POA: Diagnosis not present

## 2019-12-17 DIAGNOSIS — I10 Essential (primary) hypertension: Secondary | ICD-10-CM | POA: Diagnosis not present

## 2019-12-17 DIAGNOSIS — E119 Type 2 diabetes mellitus without complications: Secondary | ICD-10-CM | POA: Diagnosis not present

## 2019-12-17 DIAGNOSIS — E785 Hyperlipidemia, unspecified: Secondary | ICD-10-CM | POA: Diagnosis not present

## 2019-12-17 DIAGNOSIS — Z955 Presence of coronary angioplasty implant and graft: Secondary | ICD-10-CM | POA: Diagnosis not present

## 2019-12-17 DIAGNOSIS — Z7189 Other specified counseling: Secondary | ICD-10-CM | POA: Diagnosis not present

## 2019-12-22 DIAGNOSIS — Z170 Estrogen receptor positive status [ER+]: Secondary | ICD-10-CM | POA: Diagnosis not present

## 2019-12-22 DIAGNOSIS — C50512 Malignant neoplasm of lower-outer quadrant of left female breast: Secondary | ICD-10-CM | POA: Diagnosis not present

## 2019-12-28 DIAGNOSIS — Z170 Estrogen receptor positive status [ER+]: Secondary | ICD-10-CM | POA: Diagnosis not present

## 2019-12-28 DIAGNOSIS — K219 Gastro-esophageal reflux disease without esophagitis: Secondary | ICD-10-CM | POA: Diagnosis not present

## 2019-12-28 DIAGNOSIS — Z7982 Long term (current) use of aspirin: Secondary | ICD-10-CM | POA: Diagnosis not present

## 2019-12-28 DIAGNOSIS — E119 Type 2 diabetes mellitus without complications: Secondary | ICD-10-CM | POA: Diagnosis not present

## 2019-12-28 DIAGNOSIS — Z9011 Acquired absence of right breast and nipple: Secondary | ICD-10-CM | POA: Diagnosis not present

## 2019-12-28 DIAGNOSIS — Z7984 Long term (current) use of oral hypoglycemic drugs: Secondary | ICD-10-CM | POA: Diagnosis not present

## 2019-12-28 DIAGNOSIS — E785 Hyperlipidemia, unspecified: Secondary | ICD-10-CM | POA: Diagnosis not present

## 2019-12-28 DIAGNOSIS — Z79899 Other long term (current) drug therapy: Secondary | ICD-10-CM | POA: Diagnosis not present

## 2019-12-28 DIAGNOSIS — L7622 Postprocedural hemorrhage and hematoma of skin and subcutaneous tissue following other procedure: Secondary | ICD-10-CM | POA: Diagnosis not present

## 2019-12-28 DIAGNOSIS — C50411 Malignant neoplasm of upper-outer quadrant of right female breast: Secondary | ICD-10-CM | POA: Diagnosis not present

## 2019-12-28 DIAGNOSIS — L7632 Postprocedural hematoma of skin and subcutaneous tissue following other procedure: Secondary | ICD-10-CM | POA: Diagnosis not present

## 2019-12-28 DIAGNOSIS — I10 Essential (primary) hypertension: Secondary | ICD-10-CM | POA: Diagnosis not present

## 2019-12-29 DIAGNOSIS — E119 Type 2 diabetes mellitus without complications: Secondary | ICD-10-CM | POA: Diagnosis not present

## 2019-12-29 DIAGNOSIS — Z79899 Other long term (current) drug therapy: Secondary | ICD-10-CM | POA: Diagnosis not present

## 2019-12-29 DIAGNOSIS — C50411 Malignant neoplasm of upper-outer quadrant of right female breast: Secondary | ICD-10-CM | POA: Diagnosis not present

## 2019-12-29 DIAGNOSIS — Z955 Presence of coronary angioplasty implant and graft: Secondary | ICD-10-CM | POA: Diagnosis not present

## 2019-12-29 DIAGNOSIS — Z79811 Long term (current) use of aromatase inhibitors: Secondary | ICD-10-CM | POA: Diagnosis not present

## 2019-12-29 DIAGNOSIS — Z9011 Acquired absence of right breast and nipple: Secondary | ICD-10-CM | POA: Diagnosis not present

## 2019-12-29 DIAGNOSIS — Z7984 Long term (current) use of oral hypoglycemic drugs: Secondary | ICD-10-CM | POA: Diagnosis not present

## 2019-12-29 DIAGNOSIS — Z7982 Long term (current) use of aspirin: Secondary | ICD-10-CM | POA: Diagnosis not present

## 2019-12-29 DIAGNOSIS — I252 Old myocardial infarction: Secondary | ICD-10-CM | POA: Diagnosis not present

## 2019-12-29 DIAGNOSIS — S2001XA Contusion of right breast, initial encounter: Secondary | ICD-10-CM | POA: Diagnosis not present

## 2019-12-29 DIAGNOSIS — I10 Essential (primary) hypertension: Secondary | ICD-10-CM | POA: Diagnosis not present

## 2019-12-29 DIAGNOSIS — I2510 Atherosclerotic heart disease of native coronary artery without angina pectoris: Secondary | ICD-10-CM | POA: Diagnosis not present

## 2019-12-29 DIAGNOSIS — Z170 Estrogen receptor positive status [ER+]: Secondary | ICD-10-CM | POA: Diagnosis not present

## 2019-12-30 DIAGNOSIS — C50411 Malignant neoplasm of upper-outer quadrant of right female breast: Secondary | ICD-10-CM | POA: Diagnosis not present

## 2019-12-30 DIAGNOSIS — S2001XA Contusion of right breast, initial encounter: Secondary | ICD-10-CM | POA: Diagnosis not present

## 2019-12-30 DIAGNOSIS — I10 Essential (primary) hypertension: Secondary | ICD-10-CM | POA: Diagnosis not present

## 2019-12-30 DIAGNOSIS — Z79899 Other long term (current) drug therapy: Secondary | ICD-10-CM | POA: Diagnosis not present

## 2019-12-30 DIAGNOSIS — Z7984 Long term (current) use of oral hypoglycemic drugs: Secondary | ICD-10-CM | POA: Diagnosis not present

## 2019-12-30 DIAGNOSIS — Z9011 Acquired absence of right breast and nipple: Secondary | ICD-10-CM | POA: Diagnosis not present

## 2019-12-30 DIAGNOSIS — E119 Type 2 diabetes mellitus without complications: Secondary | ICD-10-CM | POA: Diagnosis not present

## 2019-12-30 DIAGNOSIS — I252 Old myocardial infarction: Secondary | ICD-10-CM | POA: Diagnosis not present

## 2019-12-30 DIAGNOSIS — Z79811 Long term (current) use of aromatase inhibitors: Secondary | ICD-10-CM | POA: Diagnosis not present

## 2019-12-30 DIAGNOSIS — I2510 Atherosclerotic heart disease of native coronary artery without angina pectoris: Secondary | ICD-10-CM | POA: Diagnosis not present

## 2019-12-30 DIAGNOSIS — Z170 Estrogen receptor positive status [ER+]: Secondary | ICD-10-CM | POA: Diagnosis not present

## 2019-12-30 DIAGNOSIS — Z955 Presence of coronary angioplasty implant and graft: Secondary | ICD-10-CM | POA: Diagnosis not present

## 2019-12-30 DIAGNOSIS — Z7982 Long term (current) use of aspirin: Secondary | ICD-10-CM | POA: Diagnosis not present

## 2019-12-31 DIAGNOSIS — I2510 Atherosclerotic heart disease of native coronary artery without angina pectoris: Secondary | ICD-10-CM | POA: Diagnosis not present

## 2019-12-31 DIAGNOSIS — Z955 Presence of coronary angioplasty implant and graft: Secondary | ICD-10-CM | POA: Diagnosis not present

## 2019-12-31 DIAGNOSIS — Z170 Estrogen receptor positive status [ER+]: Secondary | ICD-10-CM | POA: Diagnosis not present

## 2019-12-31 DIAGNOSIS — E119 Type 2 diabetes mellitus without complications: Secondary | ICD-10-CM | POA: Diagnosis not present

## 2019-12-31 DIAGNOSIS — C50411 Malignant neoplasm of upper-outer quadrant of right female breast: Secondary | ICD-10-CM | POA: Diagnosis not present

## 2019-12-31 DIAGNOSIS — Z79899 Other long term (current) drug therapy: Secondary | ICD-10-CM | POA: Diagnosis not present

## 2019-12-31 DIAGNOSIS — Z79811 Long term (current) use of aromatase inhibitors: Secondary | ICD-10-CM | POA: Diagnosis not present

## 2019-12-31 DIAGNOSIS — Z7982 Long term (current) use of aspirin: Secondary | ICD-10-CM | POA: Diagnosis not present

## 2019-12-31 DIAGNOSIS — S2001XA Contusion of right breast, initial encounter: Secondary | ICD-10-CM | POA: Diagnosis not present

## 2019-12-31 DIAGNOSIS — I252 Old myocardial infarction: Secondary | ICD-10-CM | POA: Diagnosis not present

## 2019-12-31 DIAGNOSIS — Z7984 Long term (current) use of oral hypoglycemic drugs: Secondary | ICD-10-CM | POA: Diagnosis not present

## 2019-12-31 DIAGNOSIS — I10 Essential (primary) hypertension: Secondary | ICD-10-CM | POA: Diagnosis not present

## 2019-12-31 DIAGNOSIS — Z9011 Acquired absence of right breast and nipple: Secondary | ICD-10-CM | POA: Diagnosis not present

## 2020-01-01 DIAGNOSIS — Z7189 Other specified counseling: Secondary | ICD-10-CM | POA: Diagnosis not present

## 2020-01-01 DIAGNOSIS — Z7984 Long term (current) use of oral hypoglycemic drugs: Secondary | ICD-10-CM | POA: Diagnosis not present

## 2020-01-01 DIAGNOSIS — I10 Essential (primary) hypertension: Secondary | ICD-10-CM | POA: Diagnosis not present

## 2020-01-01 DIAGNOSIS — E119 Type 2 diabetes mellitus without complications: Secondary | ICD-10-CM | POA: Diagnosis not present

## 2020-01-01 DIAGNOSIS — Z955 Presence of coronary angioplasty implant and graft: Secondary | ICD-10-CM | POA: Diagnosis not present

## 2020-01-01 DIAGNOSIS — N6489 Other specified disorders of breast: Secondary | ICD-10-CM | POA: Diagnosis not present

## 2020-01-01 DIAGNOSIS — Z79811 Long term (current) use of aromatase inhibitors: Secondary | ICD-10-CM | POA: Diagnosis not present

## 2020-01-01 DIAGNOSIS — Z9011 Acquired absence of right breast and nipple: Secondary | ICD-10-CM | POA: Diagnosis not present

## 2020-01-01 DIAGNOSIS — Z170 Estrogen receptor positive status [ER+]: Secondary | ICD-10-CM | POA: Diagnosis not present

## 2020-01-01 DIAGNOSIS — S2001XA Contusion of right breast, initial encounter: Secondary | ICD-10-CM | POA: Diagnosis not present

## 2020-01-01 DIAGNOSIS — I252 Old myocardial infarction: Secondary | ICD-10-CM | POA: Diagnosis not present

## 2020-01-01 DIAGNOSIS — Z9889 Other specified postprocedural states: Secondary | ICD-10-CM | POA: Diagnosis not present

## 2020-01-01 DIAGNOSIS — Z79899 Other long term (current) drug therapy: Secondary | ICD-10-CM | POA: Diagnosis not present

## 2020-01-01 DIAGNOSIS — C50911 Malignant neoplasm of unspecified site of right female breast: Secondary | ICD-10-CM | POA: Diagnosis not present

## 2020-01-01 DIAGNOSIS — I2510 Atherosclerotic heart disease of native coronary artery without angina pectoris: Secondary | ICD-10-CM | POA: Diagnosis not present

## 2020-01-01 DIAGNOSIS — C50411 Malignant neoplasm of upper-outer quadrant of right female breast: Secondary | ICD-10-CM | POA: Diagnosis not present

## 2020-01-01 DIAGNOSIS — Z7982 Long term (current) use of aspirin: Secondary | ICD-10-CM | POA: Diagnosis not present

## 2020-01-03 DIAGNOSIS — Z9889 Other specified postprocedural states: Secondary | ICD-10-CM | POA: Diagnosis not present

## 2020-01-05 DIAGNOSIS — S2001XA Contusion of right breast, initial encounter: Secondary | ICD-10-CM | POA: Diagnosis not present

## 2020-01-05 DIAGNOSIS — I10 Essential (primary) hypertension: Secondary | ICD-10-CM | POA: Diagnosis not present

## 2020-01-05 DIAGNOSIS — Z170 Estrogen receptor positive status [ER+]: Secondary | ICD-10-CM | POA: Diagnosis not present

## 2020-01-05 DIAGNOSIS — Z79899 Other long term (current) drug therapy: Secondary | ICD-10-CM | POA: Diagnosis not present

## 2020-01-05 DIAGNOSIS — I252 Old myocardial infarction: Secondary | ICD-10-CM | POA: Diagnosis not present

## 2020-01-05 DIAGNOSIS — Z955 Presence of coronary angioplasty implant and graft: Secondary | ICD-10-CM | POA: Diagnosis not present

## 2020-01-05 DIAGNOSIS — C50411 Malignant neoplasm of upper-outer quadrant of right female breast: Secondary | ICD-10-CM | POA: Diagnosis not present

## 2020-01-05 DIAGNOSIS — Z9011 Acquired absence of right breast and nipple: Secondary | ICD-10-CM | POA: Diagnosis not present

## 2020-01-05 DIAGNOSIS — Z7984 Long term (current) use of oral hypoglycemic drugs: Secondary | ICD-10-CM | POA: Diagnosis not present

## 2020-01-05 DIAGNOSIS — I2510 Atherosclerotic heart disease of native coronary artery without angina pectoris: Secondary | ICD-10-CM | POA: Diagnosis not present

## 2020-01-05 DIAGNOSIS — E119 Type 2 diabetes mellitus without complications: Secondary | ICD-10-CM | POA: Diagnosis not present

## 2020-01-05 DIAGNOSIS — Z7982 Long term (current) use of aspirin: Secondary | ICD-10-CM | POA: Diagnosis not present

## 2020-01-05 DIAGNOSIS — Z79811 Long term (current) use of aromatase inhibitors: Secondary | ICD-10-CM | POA: Diagnosis not present

## 2020-01-07 DIAGNOSIS — Z170 Estrogen receptor positive status [ER+]: Secondary | ICD-10-CM | POA: Diagnosis not present

## 2020-01-07 DIAGNOSIS — Z7984 Long term (current) use of oral hypoglycemic drugs: Secondary | ICD-10-CM | POA: Diagnosis not present

## 2020-01-07 DIAGNOSIS — Z7982 Long term (current) use of aspirin: Secondary | ICD-10-CM | POA: Diagnosis not present

## 2020-01-07 DIAGNOSIS — C50411 Malignant neoplasm of upper-outer quadrant of right female breast: Secondary | ICD-10-CM | POA: Diagnosis not present

## 2020-01-07 DIAGNOSIS — Z955 Presence of coronary angioplasty implant and graft: Secondary | ICD-10-CM | POA: Diagnosis not present

## 2020-01-07 DIAGNOSIS — I10 Essential (primary) hypertension: Secondary | ICD-10-CM | POA: Diagnosis not present

## 2020-01-07 DIAGNOSIS — Z79899 Other long term (current) drug therapy: Secondary | ICD-10-CM | POA: Diagnosis not present

## 2020-01-07 DIAGNOSIS — S2001XA Contusion of right breast, initial encounter: Secondary | ICD-10-CM | POA: Diagnosis not present

## 2020-01-07 DIAGNOSIS — I2510 Atherosclerotic heart disease of native coronary artery without angina pectoris: Secondary | ICD-10-CM | POA: Diagnosis not present

## 2020-01-07 DIAGNOSIS — Z79811 Long term (current) use of aromatase inhibitors: Secondary | ICD-10-CM | POA: Diagnosis not present

## 2020-01-07 DIAGNOSIS — I252 Old myocardial infarction: Secondary | ICD-10-CM | POA: Diagnosis not present

## 2020-01-07 DIAGNOSIS — E119 Type 2 diabetes mellitus without complications: Secondary | ICD-10-CM | POA: Diagnosis not present

## 2020-01-07 DIAGNOSIS — Z9011 Acquired absence of right breast and nipple: Secondary | ICD-10-CM | POA: Diagnosis not present

## 2020-01-08 ENCOUNTER — Ambulatory Visit: Payer: Medicare Other | Admitting: Cardiology

## 2020-01-08 DIAGNOSIS — Z961 Presence of intraocular lens: Secondary | ICD-10-CM | POA: Diagnosis not present

## 2020-01-08 DIAGNOSIS — H40053 Ocular hypertension, bilateral: Secondary | ICD-10-CM | POA: Diagnosis not present

## 2020-01-09 DIAGNOSIS — Z79899 Other long term (current) drug therapy: Secondary | ICD-10-CM | POA: Diagnosis not present

## 2020-01-09 DIAGNOSIS — S2001XA Contusion of right breast, initial encounter: Secondary | ICD-10-CM | POA: Diagnosis not present

## 2020-01-09 DIAGNOSIS — Z7982 Long term (current) use of aspirin: Secondary | ICD-10-CM | POA: Diagnosis not present

## 2020-01-09 DIAGNOSIS — Z955 Presence of coronary angioplasty implant and graft: Secondary | ICD-10-CM | POA: Diagnosis not present

## 2020-01-09 DIAGNOSIS — Z7984 Long term (current) use of oral hypoglycemic drugs: Secondary | ICD-10-CM | POA: Diagnosis not present

## 2020-01-09 DIAGNOSIS — Z79811 Long term (current) use of aromatase inhibitors: Secondary | ICD-10-CM | POA: Diagnosis not present

## 2020-01-09 DIAGNOSIS — C50411 Malignant neoplasm of upper-outer quadrant of right female breast: Secondary | ICD-10-CM | POA: Diagnosis not present

## 2020-01-09 DIAGNOSIS — Z9011 Acquired absence of right breast and nipple: Secondary | ICD-10-CM | POA: Diagnosis not present

## 2020-01-09 DIAGNOSIS — Z170 Estrogen receptor positive status [ER+]: Secondary | ICD-10-CM | POA: Diagnosis not present

## 2020-01-09 DIAGNOSIS — I2510 Atherosclerotic heart disease of native coronary artery without angina pectoris: Secondary | ICD-10-CM | POA: Diagnosis not present

## 2020-01-09 DIAGNOSIS — E119 Type 2 diabetes mellitus without complications: Secondary | ICD-10-CM | POA: Diagnosis not present

## 2020-01-09 DIAGNOSIS — I10 Essential (primary) hypertension: Secondary | ICD-10-CM | POA: Diagnosis not present

## 2020-01-09 DIAGNOSIS — I252 Old myocardial infarction: Secondary | ICD-10-CM | POA: Diagnosis not present

## 2020-01-12 DIAGNOSIS — Z7982 Long term (current) use of aspirin: Secondary | ICD-10-CM | POA: Diagnosis not present

## 2020-01-12 DIAGNOSIS — S2001XA Contusion of right breast, initial encounter: Secondary | ICD-10-CM | POA: Diagnosis not present

## 2020-01-12 DIAGNOSIS — Z9011 Acquired absence of right breast and nipple: Secondary | ICD-10-CM | POA: Diagnosis not present

## 2020-01-12 DIAGNOSIS — E119 Type 2 diabetes mellitus without complications: Secondary | ICD-10-CM | POA: Diagnosis not present

## 2020-01-12 DIAGNOSIS — I10 Essential (primary) hypertension: Secondary | ICD-10-CM | POA: Diagnosis not present

## 2020-01-12 DIAGNOSIS — Z7984 Long term (current) use of oral hypoglycemic drugs: Secondary | ICD-10-CM | POA: Diagnosis not present

## 2020-01-12 DIAGNOSIS — Z170 Estrogen receptor positive status [ER+]: Secondary | ICD-10-CM | POA: Diagnosis not present

## 2020-01-12 DIAGNOSIS — Z79899 Other long term (current) drug therapy: Secondary | ICD-10-CM | POA: Diagnosis not present

## 2020-01-12 DIAGNOSIS — I252 Old myocardial infarction: Secondary | ICD-10-CM | POA: Diagnosis not present

## 2020-01-12 DIAGNOSIS — Z955 Presence of coronary angioplasty implant and graft: Secondary | ICD-10-CM | POA: Diagnosis not present

## 2020-01-12 DIAGNOSIS — C50411 Malignant neoplasm of upper-outer quadrant of right female breast: Secondary | ICD-10-CM | POA: Diagnosis not present

## 2020-01-12 DIAGNOSIS — I2510 Atherosclerotic heart disease of native coronary artery without angina pectoris: Secondary | ICD-10-CM | POA: Diagnosis not present

## 2020-01-12 DIAGNOSIS — Z79811 Long term (current) use of aromatase inhibitors: Secondary | ICD-10-CM | POA: Diagnosis not present

## 2020-01-14 DIAGNOSIS — Z79899 Other long term (current) drug therapy: Secondary | ICD-10-CM | POA: Diagnosis not present

## 2020-01-14 DIAGNOSIS — C50411 Malignant neoplasm of upper-outer quadrant of right female breast: Secondary | ICD-10-CM | POA: Diagnosis not present

## 2020-01-14 DIAGNOSIS — Z79811 Long term (current) use of aromatase inhibitors: Secondary | ICD-10-CM | POA: Diagnosis not present

## 2020-01-14 DIAGNOSIS — E119 Type 2 diabetes mellitus without complications: Secondary | ICD-10-CM | POA: Diagnosis not present

## 2020-01-14 DIAGNOSIS — I252 Old myocardial infarction: Secondary | ICD-10-CM | POA: Diagnosis not present

## 2020-01-14 DIAGNOSIS — I10 Essential (primary) hypertension: Secondary | ICD-10-CM | POA: Diagnosis not present

## 2020-01-14 DIAGNOSIS — Z9011 Acquired absence of right breast and nipple: Secondary | ICD-10-CM | POA: Diagnosis not present

## 2020-01-14 DIAGNOSIS — S2001XA Contusion of right breast, initial encounter: Secondary | ICD-10-CM | POA: Diagnosis not present

## 2020-01-14 DIAGNOSIS — Z7984 Long term (current) use of oral hypoglycemic drugs: Secondary | ICD-10-CM | POA: Diagnosis not present

## 2020-01-14 DIAGNOSIS — Z955 Presence of coronary angioplasty implant and graft: Secondary | ICD-10-CM | POA: Diagnosis not present

## 2020-01-14 DIAGNOSIS — I2510 Atherosclerotic heart disease of native coronary artery without angina pectoris: Secondary | ICD-10-CM | POA: Diagnosis not present

## 2020-01-14 DIAGNOSIS — Z170 Estrogen receptor positive status [ER+]: Secondary | ICD-10-CM | POA: Diagnosis not present

## 2020-01-14 DIAGNOSIS — Z7982 Long term (current) use of aspirin: Secondary | ICD-10-CM | POA: Diagnosis not present

## 2020-01-16 DIAGNOSIS — E119 Type 2 diabetes mellitus without complications: Secondary | ICD-10-CM | POA: Diagnosis not present

## 2020-01-16 DIAGNOSIS — Z7982 Long term (current) use of aspirin: Secondary | ICD-10-CM | POA: Diagnosis not present

## 2020-01-16 DIAGNOSIS — Z79899 Other long term (current) drug therapy: Secondary | ICD-10-CM | POA: Diagnosis not present

## 2020-01-16 DIAGNOSIS — Z955 Presence of coronary angioplasty implant and graft: Secondary | ICD-10-CM | POA: Diagnosis not present

## 2020-01-16 DIAGNOSIS — Z7984 Long term (current) use of oral hypoglycemic drugs: Secondary | ICD-10-CM | POA: Diagnosis not present

## 2020-01-16 DIAGNOSIS — I2510 Atherosclerotic heart disease of native coronary artery without angina pectoris: Secondary | ICD-10-CM | POA: Diagnosis not present

## 2020-01-16 DIAGNOSIS — Z79811 Long term (current) use of aromatase inhibitors: Secondary | ICD-10-CM | POA: Diagnosis not present

## 2020-01-16 DIAGNOSIS — I10 Essential (primary) hypertension: Secondary | ICD-10-CM | POA: Diagnosis not present

## 2020-01-16 DIAGNOSIS — C50411 Malignant neoplasm of upper-outer quadrant of right female breast: Secondary | ICD-10-CM | POA: Diagnosis not present

## 2020-01-16 DIAGNOSIS — Z170 Estrogen receptor positive status [ER+]: Secondary | ICD-10-CM | POA: Diagnosis not present

## 2020-01-16 DIAGNOSIS — Z9011 Acquired absence of right breast and nipple: Secondary | ICD-10-CM | POA: Diagnosis not present

## 2020-01-16 DIAGNOSIS — S2001XA Contusion of right breast, initial encounter: Secondary | ICD-10-CM | POA: Diagnosis not present

## 2020-01-16 DIAGNOSIS — I252 Old myocardial infarction: Secondary | ICD-10-CM | POA: Diagnosis not present

## 2020-01-19 DIAGNOSIS — Z4800 Encounter for change or removal of nonsurgical wound dressing: Secondary | ICD-10-CM | POA: Diagnosis not present

## 2020-01-21 DIAGNOSIS — Z170 Estrogen receptor positive status [ER+]: Secondary | ICD-10-CM | POA: Diagnosis not present

## 2020-01-21 DIAGNOSIS — S2001XA Contusion of right breast, initial encounter: Secondary | ICD-10-CM | POA: Diagnosis not present

## 2020-01-21 DIAGNOSIS — Z7982 Long term (current) use of aspirin: Secondary | ICD-10-CM | POA: Diagnosis not present

## 2020-01-21 DIAGNOSIS — Z7984 Long term (current) use of oral hypoglycemic drugs: Secondary | ICD-10-CM | POA: Diagnosis not present

## 2020-01-21 DIAGNOSIS — I2510 Atherosclerotic heart disease of native coronary artery without angina pectoris: Secondary | ICD-10-CM | POA: Diagnosis not present

## 2020-01-21 DIAGNOSIS — Z9011 Acquired absence of right breast and nipple: Secondary | ICD-10-CM | POA: Diagnosis not present

## 2020-01-21 DIAGNOSIS — E119 Type 2 diabetes mellitus without complications: Secondary | ICD-10-CM | POA: Diagnosis not present

## 2020-01-21 DIAGNOSIS — Z955 Presence of coronary angioplasty implant and graft: Secondary | ICD-10-CM | POA: Diagnosis not present

## 2020-01-21 DIAGNOSIS — C50411 Malignant neoplasm of upper-outer quadrant of right female breast: Secondary | ICD-10-CM | POA: Diagnosis not present

## 2020-01-21 DIAGNOSIS — I252 Old myocardial infarction: Secondary | ICD-10-CM | POA: Diagnosis not present

## 2020-01-21 DIAGNOSIS — I10 Essential (primary) hypertension: Secondary | ICD-10-CM | POA: Diagnosis not present

## 2020-01-21 DIAGNOSIS — Z79899 Other long term (current) drug therapy: Secondary | ICD-10-CM | POA: Diagnosis not present

## 2020-01-21 DIAGNOSIS — Z79811 Long term (current) use of aromatase inhibitors: Secondary | ICD-10-CM | POA: Diagnosis not present

## 2020-01-22 DIAGNOSIS — Z961 Presence of intraocular lens: Secondary | ICD-10-CM | POA: Diagnosis not present

## 2020-01-22 DIAGNOSIS — H40053 Ocular hypertension, bilateral: Secondary | ICD-10-CM | POA: Diagnosis not present

## 2020-01-23 DIAGNOSIS — Z7984 Long term (current) use of oral hypoglycemic drugs: Secondary | ICD-10-CM | POA: Diagnosis not present

## 2020-01-23 DIAGNOSIS — I2510 Atherosclerotic heart disease of native coronary artery without angina pectoris: Secondary | ICD-10-CM | POA: Diagnosis not present

## 2020-01-23 DIAGNOSIS — Z79899 Other long term (current) drug therapy: Secondary | ICD-10-CM | POA: Diagnosis not present

## 2020-01-23 DIAGNOSIS — I252 Old myocardial infarction: Secondary | ICD-10-CM | POA: Diagnosis not present

## 2020-01-23 DIAGNOSIS — Z170 Estrogen receptor positive status [ER+]: Secondary | ICD-10-CM | POA: Diagnosis not present

## 2020-01-23 DIAGNOSIS — C50411 Malignant neoplasm of upper-outer quadrant of right female breast: Secondary | ICD-10-CM | POA: Diagnosis not present

## 2020-01-23 DIAGNOSIS — Z7982 Long term (current) use of aspirin: Secondary | ICD-10-CM | POA: Diagnosis not present

## 2020-01-23 DIAGNOSIS — I10 Essential (primary) hypertension: Secondary | ICD-10-CM | POA: Diagnosis not present

## 2020-01-23 DIAGNOSIS — E119 Type 2 diabetes mellitus without complications: Secondary | ICD-10-CM | POA: Diagnosis not present

## 2020-01-23 DIAGNOSIS — S2001XA Contusion of right breast, initial encounter: Secondary | ICD-10-CM | POA: Diagnosis not present

## 2020-01-23 DIAGNOSIS — Z9011 Acquired absence of right breast and nipple: Secondary | ICD-10-CM | POA: Diagnosis not present

## 2020-01-23 DIAGNOSIS — Z79811 Long term (current) use of aromatase inhibitors: Secondary | ICD-10-CM | POA: Diagnosis not present

## 2020-01-23 DIAGNOSIS — Z955 Presence of coronary angioplasty implant and graft: Secondary | ICD-10-CM | POA: Diagnosis not present

## 2020-01-25 DIAGNOSIS — Z4800 Encounter for change or removal of nonsurgical wound dressing: Secondary | ICD-10-CM | POA: Diagnosis not present

## 2020-01-27 DIAGNOSIS — E119 Type 2 diabetes mellitus without complications: Secondary | ICD-10-CM | POA: Diagnosis not present

## 2020-01-27 DIAGNOSIS — L509 Urticaria, unspecified: Secondary | ICD-10-CM | POA: Diagnosis not present

## 2020-01-27 DIAGNOSIS — I10 Essential (primary) hypertension: Secondary | ICD-10-CM | POA: Diagnosis not present

## 2020-01-27 DIAGNOSIS — C50919 Malignant neoplasm of unspecified site of unspecified female breast: Secondary | ICD-10-CM | POA: Diagnosis not present

## 2020-01-28 DIAGNOSIS — Z170 Estrogen receptor positive status [ER+]: Secondary | ICD-10-CM | POA: Diagnosis not present

## 2020-01-28 DIAGNOSIS — E785 Hyperlipidemia, unspecified: Secondary | ICD-10-CM | POA: Diagnosis not present

## 2020-01-28 DIAGNOSIS — I2510 Atherosclerotic heart disease of native coronary artery without angina pectoris: Secondary | ICD-10-CM | POA: Diagnosis not present

## 2020-01-28 DIAGNOSIS — Z7984 Long term (current) use of oral hypoglycemic drugs: Secondary | ICD-10-CM | POA: Diagnosis not present

## 2020-01-28 DIAGNOSIS — Z7982 Long term (current) use of aspirin: Secondary | ICD-10-CM | POA: Diagnosis not present

## 2020-01-28 DIAGNOSIS — C50411 Malignant neoplasm of upper-outer quadrant of right female breast: Secondary | ICD-10-CM | POA: Diagnosis not present

## 2020-01-28 DIAGNOSIS — I10 Essential (primary) hypertension: Secondary | ICD-10-CM | POA: Diagnosis not present

## 2020-01-28 DIAGNOSIS — I252 Old myocardial infarction: Secondary | ICD-10-CM | POA: Diagnosis not present

## 2020-01-28 DIAGNOSIS — Z955 Presence of coronary angioplasty implant and graft: Secondary | ICD-10-CM | POA: Diagnosis not present

## 2020-01-28 DIAGNOSIS — E119 Type 2 diabetes mellitus without complications: Secondary | ICD-10-CM | POA: Diagnosis not present

## 2020-01-28 DIAGNOSIS — Z79811 Long term (current) use of aromatase inhibitors: Secondary | ICD-10-CM | POA: Diagnosis not present

## 2020-01-28 DIAGNOSIS — Z79899 Other long term (current) drug therapy: Secondary | ICD-10-CM | POA: Diagnosis not present

## 2020-01-28 DIAGNOSIS — C50919 Malignant neoplasm of unspecified site of unspecified female breast: Secondary | ICD-10-CM | POA: Diagnosis not present

## 2020-01-30 DIAGNOSIS — Z79899 Other long term (current) drug therapy: Secondary | ICD-10-CM | POA: Diagnosis not present

## 2020-01-30 DIAGNOSIS — I252 Old myocardial infarction: Secondary | ICD-10-CM | POA: Diagnosis not present

## 2020-01-30 DIAGNOSIS — I2510 Atherosclerotic heart disease of native coronary artery without angina pectoris: Secondary | ICD-10-CM | POA: Diagnosis not present

## 2020-01-30 DIAGNOSIS — C50411 Malignant neoplasm of upper-outer quadrant of right female breast: Secondary | ICD-10-CM | POA: Diagnosis not present

## 2020-01-30 DIAGNOSIS — Z7982 Long term (current) use of aspirin: Secondary | ICD-10-CM | POA: Diagnosis not present

## 2020-01-30 DIAGNOSIS — Z79811 Long term (current) use of aromatase inhibitors: Secondary | ICD-10-CM | POA: Diagnosis not present

## 2020-01-30 DIAGNOSIS — E119 Type 2 diabetes mellitus without complications: Secondary | ICD-10-CM | POA: Diagnosis not present

## 2020-01-30 DIAGNOSIS — Z955 Presence of coronary angioplasty implant and graft: Secondary | ICD-10-CM | POA: Diagnosis not present

## 2020-01-30 DIAGNOSIS — Z170 Estrogen receptor positive status [ER+]: Secondary | ICD-10-CM | POA: Diagnosis not present

## 2020-01-30 DIAGNOSIS — I10 Essential (primary) hypertension: Secondary | ICD-10-CM | POA: Diagnosis not present

## 2020-01-30 DIAGNOSIS — Z7984 Long term (current) use of oral hypoglycemic drugs: Secondary | ICD-10-CM | POA: Diagnosis not present

## 2020-02-02 ENCOUNTER — Other Ambulatory Visit: Payer: Self-pay

## 2020-02-02 ENCOUNTER — Ambulatory Visit: Payer: Medicare Other | Admitting: Cardiology

## 2020-02-02 ENCOUNTER — Encounter: Payer: Self-pay | Admitting: Cardiology

## 2020-02-02 VITALS — BP 160/70 | HR 62 | Ht 65.5 in | Wt 288.0 lb

## 2020-02-02 DIAGNOSIS — Z79899 Other long term (current) drug therapy: Secondary | ICD-10-CM | POA: Diagnosis not present

## 2020-02-02 DIAGNOSIS — I10 Essential (primary) hypertension: Secondary | ICD-10-CM

## 2020-02-02 DIAGNOSIS — E782 Mixed hyperlipidemia: Secondary | ICD-10-CM

## 2020-02-02 DIAGNOSIS — Z7982 Long term (current) use of aspirin: Secondary | ICD-10-CM | POA: Diagnosis not present

## 2020-02-02 DIAGNOSIS — Z79811 Long term (current) use of aromatase inhibitors: Secondary | ICD-10-CM | POA: Diagnosis not present

## 2020-02-02 DIAGNOSIS — I2510 Atherosclerotic heart disease of native coronary artery without angina pectoris: Secondary | ICD-10-CM | POA: Diagnosis not present

## 2020-02-02 DIAGNOSIS — I252 Old myocardial infarction: Secondary | ICD-10-CM | POA: Diagnosis not present

## 2020-02-02 DIAGNOSIS — Z955 Presence of coronary angioplasty implant and graft: Secondary | ICD-10-CM | POA: Diagnosis not present

## 2020-02-02 DIAGNOSIS — Z170 Estrogen receptor positive status [ER+]: Secondary | ICD-10-CM | POA: Diagnosis not present

## 2020-02-02 DIAGNOSIS — C50411 Malignant neoplasm of upper-outer quadrant of right female breast: Secondary | ICD-10-CM | POA: Diagnosis not present

## 2020-02-02 DIAGNOSIS — E119 Type 2 diabetes mellitus without complications: Secondary | ICD-10-CM | POA: Diagnosis not present

## 2020-02-02 DIAGNOSIS — Z7984 Long term (current) use of oral hypoglycemic drugs: Secondary | ICD-10-CM | POA: Diagnosis not present

## 2020-02-02 MED ORDER — AMLODIPINE BESYLATE 5 MG PO TABS
5.0000 mg | ORAL_TABLET | Freq: Every day | ORAL | 3 refills | Status: DC
Start: 2020-02-02 — End: 2020-07-09

## 2020-02-02 NOTE — Progress Notes (Signed)
Clinical Summary Ms. Carbon is a 64 y.o.female seen today for follow up of the following medical problems  1. CAD - history of NSTEMI 06/2012, received DES to prox LAD and DES to mid LCX - 06/2012 echo LVEF 55-60%, grade I DDx  - no recent chest pain. No SOB/DOE   2. HTN - compliant with meds - home SBPs 140s  -last visit stopped lisiopril/HCTZ, started lisinopril and chlorthalidone - home bp's 120s-150s   3. Hyperlipidemia - compliant with statin   4. Breast cancer - recent lumpectomy  5. Varicose veins - followed by vascular  SH: son is Randa Ngo, patient here    Past Medical History:  Diagnosis Date  . Coronary artery disease   . Diabetes mellitus   . GERD (gastroesophageal reflux disease)   . Hypertension   . Myocardial abscess   . Myocardial infarct ([place]) 06/24/2012     No Known Allergies   Current Outpatient Medications  Medication Sig Dispense Refill  . aspirin EC 81 MG tablet Take 81 mg by mouth daily.    Marland Kitchen atorvastatin (LIPITOR) 40 MG tablet Take 1 tablet (40 mg total) by mouth daily. 90 tablet 1  . cetirizine (ZYRTEC) 10 MG tablet Take 10 mg by mouth daily.    . chlorthalidone (HYGROTON) 25 MG tablet Take 1 tablet (25 mg total) by mouth daily. 90 tablet 1  . dorzolamide-timolol (COSOPT) 22.3-6.8 MG/ML ophthalmic solution Place 1 drop into both eyes 2 (two) times daily.    . famotidine (PEPCID) 40 MG tablet Take 1 tablet (40 mg total) by mouth daily. 20 tablet 0  . glipiZIDE (GLUCOTROL) 10 MG tablet Take 10 mg by mouth 2 (two) times daily before a meal.    . lisinopril (ZESTRIL) 40 MG tablet Take 1 tablet (40 mg total) by mouth daily. 90 tablet 1  . metoprolol tartrate (LOPRESSOR) 25 MG tablet Take by mouth.    . Prenatal Vit-Fe Fumarate-FA (PRENATAL MULTIVITAMIN) TABS Take 1 tablet by mouth daily.    . sitaGLIPtin-metformin (JANUMET) 50-1000 MG tablet Take by mouth.    Marland Kitchen UNABLE TO FIND Take by mouth.     No current  facility-administered medications for this visit.     Past Surgical History:  Procedure Laterality Date  . ANTERIOR VITRECTOMY Left 07/21/2019   Procedure: ANTERIOR VITRECTOMY;  Surgeon: Baruch Goldmann, MD;  Location: [facility];  Service: Ophthalmology;  Laterality: Left;  . ANTERIOR VITRECTOMY Right 08/04/2019   Procedure: ANTERIOR VITRECTOMY;  Surgeon: Baruch Goldmann, MD;  Location: [facility];  Service: Ophthalmology;  Laterality: Right;  . CARDIAC CATHETERIZATION    . CATARACT EXTRACTION W/PHACO Left 07/21/2019   Procedure: CATARACT EXTRACTION PHACO AND INTRAOCULAR LENS PLACEMENT (IOC) (CDE: 11.45);  Surgeon: Baruch Goldmann, MD;  Location: [facility];  Service: Ophthalmology;  Laterality: Left;  . CATARACT EXTRACTION W/PHACO Right 08/04/2019   Procedure: CATARACT EXTRACTION PHACO AND INTRAOCULAR LENS PLACEMENT (IOC);  Surgeon: Baruch Goldmann, MD;  Location: [facility];  Service: Ophthalmology;  Laterality: Right;  CDE: 9.78  . CESAREAN SECTION    . CORONARY STENT PLACEMENT N/A 06/24/2012  . LEFT HEART CATHETERIZATION WITH CORONARY ANGIOGRAM N/A 06/24/2012   Procedure: LEFT HEART CATHETERIZATION WITH CORONARY ANGIOGRAM;  Surgeon: Jolaine Artist, MD;  Location: [facility] CATH LAB;  Service: Cardiovascular;  Laterality: N/A;     No Known Allergies    Family History  Problem Relation Age of Onset  . Heart disease Father   . Diabetes Other   . Hypertension  Other   . Cancer Other      Social History Ms. Marone reports that she has never smoked. She has never used smokeless tobacco. Ms. Senseney reports no history of alcohol use.   Review of Systems CONSTITUTIONAL: No weight loss, fever, chills, weakness or fatigue.  HEENT: Eyes: No visual loss, blurred vision, double vision or yellow sclerae.No hearing loss, sneezing, congestion, runny nose or sore throat.  SKIN: No rash or itching.  CARDIOVASCULAR: per hpi RESPIRATORY: No shortness of breath, cough or sputum.  GASTROINTESTINAL: No anorexia,  nausea, vomiting or diarrhea. No abdominal pain or blood.  GENITOURINARY: No burning on urination, no polyuria NEUROLOGICAL: No headache, dizziness, syncope, paralysis, ataxia, numbness or tingling in the extremities. No change in bowel or bladder control.  MUSCULOSKELETAL: No muscle, back pain, joint pain or stiffness.  LYMPHATICS: No enlarged nodes. No history of splenectomy.  PSYCHIATRIC: No history of depression or anxiety.  ENDOCRINOLOGIC: No reports of sweating, cold or heat intolerance. No polyuria or polydipsia.  Marland Kitchen   Physical Examination Today's Vitals   02/02/20 1332  BP: (!) 160/70  Pulse: 62  Weight: 288 lb (130.6 kg)  Height: 5' 5.5" (1.664 m)   Body mass index is 47.2 kg/m.  Gen: resting comfortably, no acute distress HEENT: no scleral icterus, pupils equal round and reactive, no palptable cervical adenopathy,  CV: RRR, no m/r/g, no jvd Resp: Clear to auscultation bilaterally GI: abdomen is soft, non-tender, non-distended, normal bowel sounds, no hepatosplenomegaly MSK: extremities are warm, no edema.  Skin: warm, no rash Neuro:  no focal deficits Psych: appropriate affect   Diagnostic Studies  06/2012 echo Study Conclusions   - Left ventricle: The cavity size was normal. Wall thickness  was normal. Systolic function was normal. The estimated  ejection fraction was in the range of 55% to 60%. Wall  motion was normal; there were no regional wall motion  abnormalities. Doppler parameters are consistent with  abnormal left ventricular relaxation (grade 1 diastolic  dysfunction). Doppler parameters are consistent with high  ventricular filling pressure.  - Mitral valve: Calcified annulus.  - Left atrium: The atrium was mildly dilated.  - Atrial septum: There was increased thickness of the  septum, consistent with lipomatous hypertrophy.    06/2012 cath   Findings:  Ao Pressure: 125/59 (83) LV Pressure: 137/11/20 There was no  signficant gradient across the aortic valve on pullback.  Left main: Short.Normal  LAD: Large vessel with proximal 80% lesion. Mid vessel 30%. Distal vessel has diffuse moderate disease.   LCX: Large vessel gives off 3 OMs. 99% midsection followed by 40%. Distal vessel is small with 70% lesion.   RCA: Dominant. Normal.   LV-gram done in the RAO projection: Ejection fraction = 60%. No regional wall motion abnormalities.   Assessment: 1. 2V CAD 2. Normal LV function 3. NSTEMI  Plan/Discussion:  Plan PCI of LCX & LAD.   PCI Data: Vessel - proximal and mid left circumflex/Segment - proximal and mid Percent Stenosis (pre) 95% TIMI-flow 3 Stent 3.0 x 32 mm Promus drug-eluting stent postdilated with a 3.5 noncompliant balloon. Percent Stenosis (post) 0% TIMI-flow (post) 3   Vessel - LAD/Segment - proximal  Percent Stenosis (pre) 85% TIMI-flow 3 Stent 3.0 x 20 mm Promus drug-eluting stent postdilated with a 3.5 noncompliant balloon. Percent Stenosis (post) 0% TIMI-flow (post) 3  Final Conclusions:  Successful angioplasty and drug-eluting stent placement to the proximal/mid left circumflex as well as proximal left anterior descending artery.  Recommendations:  Recommend  dual antiplatelet therapy for at least one year. Aggressive treatment of risk factors is recommended.   Assessment and Plan   1. CAD - no symptoms, continue current meds  2. HTN - remains above goal, start norvasc 5mg  daily - discussed low sodium diet, DASH diet  3. Hyperlipidemia - continue staitn      Arnoldo Lenis, M.D.

## 2020-02-02 NOTE — Patient Instructions (Addendum)
Medication Instructions:   Your physician has recommended you make the following change in your medication:   Start amlodipine 5 mg by mouth daily  Continue other medications the same  Labwork:  None  Testing/Procedures:  None  Follow-Up:  Your physician recommends that you schedule a follow-up appointment in: 6 months [facility])  Any Other Special Instructions Will Be Listed Below (If Applicable).  If you need a refill on your cardiac medications before your next appointment, please call your pharmacy. DASH Eating Plan DASH stands for "Dietary Approaches to Stop Hypertension." The DASH eating plan is a healthy eating plan that has been shown to reduce high blood pressure (hypertension). It may also reduce your risk for type 2 diabetes, heart disease, and stroke. The DASH eating plan may also help with weight loss. What are tips for following this plan?  General guidelines  Avoid eating more than 2,300 mg (milligrams) of salt (sodium) a day. If you have hypertension, you may need to reduce your sodium intake to 1,500 mg a day.  Limit alcohol intake to no more than 1 drink a day for nonpregnant women and 2 drinks a day for men. One drink equals 12 oz of beer, 5 oz of wine, or 1 oz of hard liquor.  Work with your health care provider to maintain a healthy body weight or to lose weight. Ask what an ideal weight is for you.  Get at least 30 minutes of exercise that causes your heart to beat faster (aerobic exercise) most days of the week. Activities may include walking, swimming, or biking.  Work with your health care provider or diet and nutrition specialist (dietitian) to adjust your eating plan to your individual calorie needs. Reading food labels   Check food labels for the amount of sodium per serving. Choose foods with less than 5 percent of the Daily Value of sodium. Generally, foods with less than 300 mg of sodium per serving fit into this eating plan.  To find whole  grains, look for the word "whole" as the first word in the ingredient list. Shopping  Buy products labeled as "low-sodium" or "no salt added."  Buy fresh foods. Avoid canned foods and premade or frozen meals. Cooking  Avoid adding salt when cooking. Use salt-free seasonings or herbs instead of table salt or sea salt. Check with your health care provider or pharmacist before using salt substitutes.  Do not fry foods. Cook foods using healthy methods such as baking, boiling, grilling, and broiling instead.  Cook with heart-healthy oils, such as olive, canola, soybean, or sunflower oil. Meal planning  Eat a balanced diet that includes: ? 5 or more servings of fruits and vegetables each day. At each meal, try to fill half of your plate with fruits and vegetables. ? Up to 6-8 servings of whole grains each day. ? Less than 6 oz of lean meat, poultry, or fish each day. A 3-oz serving of meat is about the same size as a deck of cards. One egg equals 1 oz. ? 2 servings of low-fat dairy each day. ? A serving of nuts, seeds, or beans 5 times each week. ? Heart-healthy fats. Healthy fats called Omega-3 fatty acids are found in foods such as flaxseeds and coldwater fish, like sardines, salmon, and mackerel.  Limit how much you eat of the following: ? Canned or prepackaged foods. ? Food that is high in trans fat, such as fried foods. ? Food that is high in saturated fat, such as fatty  meat. ? Sweets, desserts, sugary drinks, and other foods with added sugar. ? Full-fat dairy products.  Do not salt foods before eating.  Try to eat at least 2 vegetarian meals each week.  Eat more home-cooked food and less restaurant, buffet, and fast food.  When eating at a restaurant, ask that your food be prepared with less salt or no salt, if possible. What foods are recommended? The items listed may not be a complete list. Talk with your dietitian about what dietary choices are best for  you. Grains Whole-grain or whole-wheat bread. Whole-grain or whole-wheat pasta. Brown rice. [place]row. Bulgur. Whole-grain and low-sodium cereals. Pita bread. Low-fat, low-sodium crackers. Whole-wheat flour tortillas. Vegetables Fresh or frozen vegetables (raw, steamed, roasted, or grilled). Low-sodium or reduced-sodium tomato and vegetable juice. Low-sodium or reduced-sodium tomato sauce and tomato paste. Low-sodium or reduced-sodium canned vegetables. Fruits All fresh, dried, or frozen fruit. Canned fruit in natural juice (without added sugar). Meat and other protein foods Skinless chicken or [place]. Ground chicken or [place]. Pork with fat trimmed off. Fish and seafood. Egg whites. Dried beans, peas, or lentils. Unsalted nuts, nut butters, and seeds. Unsalted canned beans. Lean cuts of beef with fat trimmed off. Low-sodium, lean deli meat. Dairy Low-fat (1%) or fat-free (skim) milk. Fat-free, low-fat, or reduced-fat cheeses. Nonfat, low-sodium ricotta or cottage cheese. Low-fat or nonfat yogurt. Low-fat, low-sodium cheese. Fats and oils Soft margarine without trans fats. Vegetable oil. Low-fat, reduced-fat, or light mayonnaise and salad dressings (reduced-sodium). Canola, safflower, olive, soybean, and sunflower oils. Avocado. Seasoning and other foods Herbs. Spices. Seasoning mixes without salt. Unsalted popcorn and pretzels. Fat-free sweets. What foods are not recommended? The items listed may not be a complete list. Talk with your dietitian about what dietary choices are best for you. Grains Baked goods made with fat, such as croissants, muffins, or some breads. Dry pasta or rice meal packs. Vegetables Creamed or fried vegetables. Vegetables in a cheese sauce. Regular canned vegetables (not low-sodium or reduced-sodium). Regular canned tomato sauce and paste (not low-sodium or reduced-sodium). Regular tomato and vegetable juice (not low-sodium or reduced-sodium). Angie Fava.  Olives. Fruits Canned fruit in a light or heavy syrup. Fried fruit. Fruit in cream or butter sauce. Meat and other protein foods Fatty cuts of meat. Ribs. Fried meat. Berniece Salines. Sausage. Bologna and other processed lunch meats. Salami. Fatback. Hotdogs. Bratwurst. Salted nuts and seeds. Canned beans with added salt. Canned or smoked fish. Whole eggs or egg yolks. Chicken or [place] with skin. Dairy Whole or 2% milk, cream, and half-and-half. Whole or full-fat cream cheese. Whole-fat or sweetened yogurt. Full-fat cheese. Nondairy creamers. Whipped toppings. Processed cheese and cheese spreads. Fats and oils Butter. Stick margarine. Lard. Shortening. Ghee. Bacon fat. Tropical oils, such as coconut, palm kernel, or palm oil. Seasoning and other foods Salted popcorn and pretzels. Onion salt, garlic salt, seasoned salt, table salt, and sea salt. Worcestershire sauce. Tartar sauce. Barbecue sauce. Teriyaki sauce. Soy sauce, including reduced-sodium. Steak sauce. Canned and packaged gravies. Fish sauce. Oyster sauce. Cocktail sauce. Horseradish that you find on the shelf. Ketchup. Mustard. Meat flavorings and tenderizers. Bouillon cubes. Hot sauce and Tabasco sauce. Premade or packaged marinades. Premade or packaged taco seasonings. Relishes. Regular salad dressings. Where to find more information:  National Heart, Lung, and [place]: https://wilson-eaton.com/  American Heart Association: www.heart.org Summary  The DASH eating plan is a healthy eating plan that has been shown to reduce high blood pressure (hypertension). It may also reduce your risk for type  2 diabetes, heart disease, and stroke.  With the DASH eating plan, you should limit salt (sodium) intake to 2,300 mg a day. If you have hypertension, you may need to reduce your sodium intake to 1,500 mg a day.  When on the DASH eating plan, aim to eat more fresh fruits and vegetables, whole grains, lean proteins, low-fat dairy, and heart-healthy  fats.  Work with your health care provider or diet and nutrition specialist (dietitian) to adjust your eating plan to your individual calorie needs. This information is not intended to replace advice given to you by your health care provider. Make sure you discuss any questions you have with your health care provider. Document Revised: 04/13/2017 Document Reviewed: 04/24/2016 Elsevier Patient Education  2020 [employer].

## 2020-02-04 DIAGNOSIS — E119 Type 2 diabetes mellitus without complications: Secondary | ICD-10-CM | POA: Diagnosis not present

## 2020-02-04 DIAGNOSIS — M8589 Other specified disorders of bone density and structure, multiple sites: Secondary | ICD-10-CM | POA: Diagnosis not present

## 2020-02-04 DIAGNOSIS — C50911 Malignant neoplasm of unspecified site of right female breast: Secondary | ICD-10-CM | POA: Diagnosis not present

## 2020-02-04 DIAGNOSIS — Z955 Presence of coronary angioplasty implant and graft: Secondary | ICD-10-CM | POA: Diagnosis not present

## 2020-02-04 DIAGNOSIS — Z7984 Long term (current) use of oral hypoglycemic drugs: Secondary | ICD-10-CM | POA: Diagnosis not present

## 2020-02-04 DIAGNOSIS — Z170 Estrogen receptor positive status [ER+]: Secondary | ICD-10-CM | POA: Diagnosis not present

## 2020-02-04 DIAGNOSIS — Z79811 Long term (current) use of aromatase inhibitors: Secondary | ICD-10-CM | POA: Diagnosis not present

## 2020-02-04 DIAGNOSIS — I252 Old myocardial infarction: Secondary | ICD-10-CM | POA: Diagnosis not present

## 2020-02-04 DIAGNOSIS — I2510 Atherosclerotic heart disease of native coronary artery without angina pectoris: Secondary | ICD-10-CM | POA: Diagnosis not present

## 2020-02-04 DIAGNOSIS — C50411 Malignant neoplasm of upper-outer quadrant of right female breast: Secondary | ICD-10-CM | POA: Diagnosis not present

## 2020-02-04 DIAGNOSIS — Z7982 Long term (current) use of aspirin: Secondary | ICD-10-CM | POA: Diagnosis not present

## 2020-02-04 DIAGNOSIS — Z9889 Other specified postprocedural states: Secondary | ICD-10-CM | POA: Diagnosis not present

## 2020-02-04 DIAGNOSIS — Z79899 Other long term (current) drug therapy: Secondary | ICD-10-CM | POA: Diagnosis not present

## 2020-02-04 DIAGNOSIS — I10 Essential (primary) hypertension: Secondary | ICD-10-CM | POA: Diagnosis not present

## 2020-02-06 DIAGNOSIS — Z170 Estrogen receptor positive status [ER+]: Secondary | ICD-10-CM | POA: Diagnosis not present

## 2020-02-06 DIAGNOSIS — Z79811 Long term (current) use of aromatase inhibitors: Secondary | ICD-10-CM | POA: Diagnosis not present

## 2020-02-06 DIAGNOSIS — Z7982 Long term (current) use of aspirin: Secondary | ICD-10-CM | POA: Diagnosis not present

## 2020-02-06 DIAGNOSIS — I2510 Atherosclerotic heart disease of native coronary artery without angina pectoris: Secondary | ICD-10-CM | POA: Diagnosis not present

## 2020-02-06 DIAGNOSIS — E119 Type 2 diabetes mellitus without complications: Secondary | ICD-10-CM | POA: Diagnosis not present

## 2020-02-06 DIAGNOSIS — Z7984 Long term (current) use of oral hypoglycemic drugs: Secondary | ICD-10-CM | POA: Diagnosis not present

## 2020-02-06 DIAGNOSIS — Z79899 Other long term (current) drug therapy: Secondary | ICD-10-CM | POA: Diagnosis not present

## 2020-02-06 DIAGNOSIS — C50411 Malignant neoplasm of upper-outer quadrant of right female breast: Secondary | ICD-10-CM | POA: Diagnosis not present

## 2020-02-06 DIAGNOSIS — I10 Essential (primary) hypertension: Secondary | ICD-10-CM | POA: Diagnosis not present

## 2020-02-06 DIAGNOSIS — Z955 Presence of coronary angioplasty implant and graft: Secondary | ICD-10-CM | POA: Diagnosis not present

## 2020-02-06 DIAGNOSIS — I252 Old myocardial infarction: Secondary | ICD-10-CM | POA: Diagnosis not present

## 2020-02-09 DIAGNOSIS — C50411 Malignant neoplasm of upper-outer quadrant of right female breast: Secondary | ICD-10-CM | POA: Diagnosis not present

## 2020-02-09 DIAGNOSIS — I10 Essential (primary) hypertension: Secondary | ICD-10-CM | POA: Diagnosis not present

## 2020-02-09 DIAGNOSIS — Z170 Estrogen receptor positive status [ER+]: Secondary | ICD-10-CM | POA: Diagnosis not present

## 2020-02-09 DIAGNOSIS — I252 Old myocardial infarction: Secondary | ICD-10-CM | POA: Diagnosis not present

## 2020-02-09 DIAGNOSIS — E119 Type 2 diabetes mellitus without complications: Secondary | ICD-10-CM | POA: Diagnosis not present

## 2020-02-09 DIAGNOSIS — Z79811 Long term (current) use of aromatase inhibitors: Secondary | ICD-10-CM | POA: Diagnosis not present

## 2020-02-09 DIAGNOSIS — Z7984 Long term (current) use of oral hypoglycemic drugs: Secondary | ICD-10-CM | POA: Diagnosis not present

## 2020-02-09 DIAGNOSIS — Z955 Presence of coronary angioplasty implant and graft: Secondary | ICD-10-CM | POA: Diagnosis not present

## 2020-02-09 DIAGNOSIS — I2510 Atherosclerotic heart disease of native coronary artery without angina pectoris: Secondary | ICD-10-CM | POA: Diagnosis not present

## 2020-02-09 DIAGNOSIS — Z79899 Other long term (current) drug therapy: Secondary | ICD-10-CM | POA: Diagnosis not present

## 2020-02-09 DIAGNOSIS — Z7982 Long term (current) use of aspirin: Secondary | ICD-10-CM | POA: Diagnosis not present

## 2020-02-11 DIAGNOSIS — I10 Essential (primary) hypertension: Secondary | ICD-10-CM | POA: Diagnosis not present

## 2020-02-11 DIAGNOSIS — Z79899 Other long term (current) drug therapy: Secondary | ICD-10-CM | POA: Diagnosis not present

## 2020-02-11 DIAGNOSIS — Z79811 Long term (current) use of aromatase inhibitors: Secondary | ICD-10-CM | POA: Diagnosis not present

## 2020-02-11 DIAGNOSIS — Z955 Presence of coronary angioplasty implant and graft: Secondary | ICD-10-CM | POA: Diagnosis not present

## 2020-02-11 DIAGNOSIS — Z7984 Long term (current) use of oral hypoglycemic drugs: Secondary | ICD-10-CM | POA: Diagnosis not present

## 2020-02-11 DIAGNOSIS — Z170 Estrogen receptor positive status [ER+]: Secondary | ICD-10-CM | POA: Diagnosis not present

## 2020-02-11 DIAGNOSIS — C50411 Malignant neoplasm of upper-outer quadrant of right female breast: Secondary | ICD-10-CM | POA: Diagnosis not present

## 2020-02-11 DIAGNOSIS — I2510 Atherosclerotic heart disease of native coronary artery without angina pectoris: Secondary | ICD-10-CM | POA: Diagnosis not present

## 2020-02-11 DIAGNOSIS — I252 Old myocardial infarction: Secondary | ICD-10-CM | POA: Diagnosis not present

## 2020-02-11 DIAGNOSIS — Z7982 Long term (current) use of aspirin: Secondary | ICD-10-CM | POA: Diagnosis not present

## 2020-02-11 DIAGNOSIS — E119 Type 2 diabetes mellitus without complications: Secondary | ICD-10-CM | POA: Diagnosis not present

## 2020-02-13 DIAGNOSIS — I2510 Atherosclerotic heart disease of native coronary artery without angina pectoris: Secondary | ICD-10-CM | POA: Diagnosis not present

## 2020-02-13 DIAGNOSIS — Z7984 Long term (current) use of oral hypoglycemic drugs: Secondary | ICD-10-CM | POA: Diagnosis not present

## 2020-02-13 DIAGNOSIS — C50411 Malignant neoplasm of upper-outer quadrant of right female breast: Secondary | ICD-10-CM | POA: Diagnosis not present

## 2020-02-13 DIAGNOSIS — Z79811 Long term (current) use of aromatase inhibitors: Secondary | ICD-10-CM | POA: Diagnosis not present

## 2020-02-13 DIAGNOSIS — Z79899 Other long term (current) drug therapy: Secondary | ICD-10-CM | POA: Diagnosis not present

## 2020-02-13 DIAGNOSIS — Z170 Estrogen receptor positive status [ER+]: Secondary | ICD-10-CM | POA: Diagnosis not present

## 2020-02-13 DIAGNOSIS — I10 Essential (primary) hypertension: Secondary | ICD-10-CM | POA: Diagnosis not present

## 2020-02-13 DIAGNOSIS — Z7982 Long term (current) use of aspirin: Secondary | ICD-10-CM | POA: Diagnosis not present

## 2020-02-13 DIAGNOSIS — Z955 Presence of coronary angioplasty implant and graft: Secondary | ICD-10-CM | POA: Diagnosis not present

## 2020-02-13 DIAGNOSIS — E119 Type 2 diabetes mellitus without complications: Secondary | ICD-10-CM | POA: Diagnosis not present

## 2020-02-13 DIAGNOSIS — I252 Old myocardial infarction: Secondary | ICD-10-CM | POA: Diagnosis not present

## 2020-02-16 DIAGNOSIS — E119 Type 2 diabetes mellitus without complications: Secondary | ICD-10-CM | POA: Diagnosis not present

## 2020-02-16 DIAGNOSIS — Z7984 Long term (current) use of oral hypoglycemic drugs: Secondary | ICD-10-CM | POA: Diagnosis not present

## 2020-02-16 DIAGNOSIS — Z79899 Other long term (current) drug therapy: Secondary | ICD-10-CM | POA: Diagnosis not present

## 2020-02-16 DIAGNOSIS — I10 Essential (primary) hypertension: Secondary | ICD-10-CM | POA: Diagnosis not present

## 2020-02-16 DIAGNOSIS — I252 Old myocardial infarction: Secondary | ICD-10-CM | POA: Diagnosis not present

## 2020-02-16 DIAGNOSIS — Z170 Estrogen receptor positive status [ER+]: Secondary | ICD-10-CM | POA: Diagnosis not present

## 2020-02-16 DIAGNOSIS — C50411 Malignant neoplasm of upper-outer quadrant of right female breast: Secondary | ICD-10-CM | POA: Diagnosis not present

## 2020-02-16 DIAGNOSIS — Z7982 Long term (current) use of aspirin: Secondary | ICD-10-CM | POA: Diagnosis not present

## 2020-02-16 DIAGNOSIS — Z79811 Long term (current) use of aromatase inhibitors: Secondary | ICD-10-CM | POA: Diagnosis not present

## 2020-02-16 DIAGNOSIS — I2510 Atherosclerotic heart disease of native coronary artery without angina pectoris: Secondary | ICD-10-CM | POA: Diagnosis not present

## 2020-02-16 DIAGNOSIS — Z955 Presence of coronary angioplasty implant and graft: Secondary | ICD-10-CM | POA: Diagnosis not present

## 2020-02-18 DIAGNOSIS — Z7982 Long term (current) use of aspirin: Secondary | ICD-10-CM | POA: Diagnosis not present

## 2020-02-18 DIAGNOSIS — I10 Essential (primary) hypertension: Secondary | ICD-10-CM | POA: Diagnosis not present

## 2020-02-18 DIAGNOSIS — C50411 Malignant neoplasm of upper-outer quadrant of right female breast: Secondary | ICD-10-CM | POA: Diagnosis not present

## 2020-02-18 DIAGNOSIS — E119 Type 2 diabetes mellitus without complications: Secondary | ICD-10-CM | POA: Diagnosis not present

## 2020-02-18 DIAGNOSIS — Z79811 Long term (current) use of aromatase inhibitors: Secondary | ICD-10-CM | POA: Diagnosis not present

## 2020-02-18 DIAGNOSIS — Z955 Presence of coronary angioplasty implant and graft: Secondary | ICD-10-CM | POA: Diagnosis not present

## 2020-02-18 DIAGNOSIS — Z170 Estrogen receptor positive status [ER+]: Secondary | ICD-10-CM | POA: Diagnosis not present

## 2020-02-18 DIAGNOSIS — I2510 Atherosclerotic heart disease of native coronary artery without angina pectoris: Secondary | ICD-10-CM | POA: Diagnosis not present

## 2020-02-18 DIAGNOSIS — Z79899 Other long term (current) drug therapy: Secondary | ICD-10-CM | POA: Diagnosis not present

## 2020-02-18 DIAGNOSIS — I252 Old myocardial infarction: Secondary | ICD-10-CM | POA: Diagnosis not present

## 2020-02-18 DIAGNOSIS — Z7984 Long term (current) use of oral hypoglycemic drugs: Secondary | ICD-10-CM | POA: Diagnosis not present

## 2020-02-20 DIAGNOSIS — Z7982 Long term (current) use of aspirin: Secondary | ICD-10-CM | POA: Diagnosis not present

## 2020-02-20 DIAGNOSIS — I10 Essential (primary) hypertension: Secondary | ICD-10-CM | POA: Diagnosis not present

## 2020-02-20 DIAGNOSIS — I252 Old myocardial infarction: Secondary | ICD-10-CM | POA: Diagnosis not present

## 2020-02-20 DIAGNOSIS — Z170 Estrogen receptor positive status [ER+]: Secondary | ICD-10-CM | POA: Diagnosis not present

## 2020-02-20 DIAGNOSIS — Z79899 Other long term (current) drug therapy: Secondary | ICD-10-CM | POA: Diagnosis not present

## 2020-02-20 DIAGNOSIS — E119 Type 2 diabetes mellitus without complications: Secondary | ICD-10-CM | POA: Diagnosis not present

## 2020-02-20 DIAGNOSIS — Z79811 Long term (current) use of aromatase inhibitors: Secondary | ICD-10-CM | POA: Diagnosis not present

## 2020-02-20 DIAGNOSIS — Z955 Presence of coronary angioplasty implant and graft: Secondary | ICD-10-CM | POA: Diagnosis not present

## 2020-02-20 DIAGNOSIS — Z7984 Long term (current) use of oral hypoglycemic drugs: Secondary | ICD-10-CM | POA: Diagnosis not present

## 2020-02-20 DIAGNOSIS — I2510 Atherosclerotic heart disease of native coronary artery without angina pectoris: Secondary | ICD-10-CM | POA: Diagnosis not present

## 2020-02-20 DIAGNOSIS — C50411 Malignant neoplasm of upper-outer quadrant of right female breast: Secondary | ICD-10-CM | POA: Diagnosis not present

## 2020-02-23 DIAGNOSIS — Z7982 Long term (current) use of aspirin: Secondary | ICD-10-CM | POA: Diagnosis not present

## 2020-02-23 DIAGNOSIS — I10 Essential (primary) hypertension: Secondary | ICD-10-CM | POA: Diagnosis not present

## 2020-02-23 DIAGNOSIS — Z79899 Other long term (current) drug therapy: Secondary | ICD-10-CM | POA: Diagnosis not present

## 2020-02-23 DIAGNOSIS — I252 Old myocardial infarction: Secondary | ICD-10-CM | POA: Diagnosis not present

## 2020-02-23 DIAGNOSIS — Z7984 Long term (current) use of oral hypoglycemic drugs: Secondary | ICD-10-CM | POA: Diagnosis not present

## 2020-02-23 DIAGNOSIS — Z79811 Long term (current) use of aromatase inhibitors: Secondary | ICD-10-CM | POA: Diagnosis not present

## 2020-02-23 DIAGNOSIS — Z955 Presence of coronary angioplasty implant and graft: Secondary | ICD-10-CM | POA: Diagnosis not present

## 2020-02-23 DIAGNOSIS — C50411 Malignant neoplasm of upper-outer quadrant of right female breast: Secondary | ICD-10-CM | POA: Diagnosis not present

## 2020-02-23 DIAGNOSIS — E119 Type 2 diabetes mellitus without complications: Secondary | ICD-10-CM | POA: Diagnosis not present

## 2020-02-23 DIAGNOSIS — I2510 Atherosclerotic heart disease of native coronary artery without angina pectoris: Secondary | ICD-10-CM | POA: Diagnosis not present

## 2020-02-23 DIAGNOSIS — Z170 Estrogen receptor positive status [ER+]: Secondary | ICD-10-CM | POA: Diagnosis not present

## 2020-02-26 DIAGNOSIS — Z955 Presence of coronary angioplasty implant and graft: Secondary | ICD-10-CM | POA: Diagnosis not present

## 2020-02-26 DIAGNOSIS — I252 Old myocardial infarction: Secondary | ICD-10-CM | POA: Diagnosis not present

## 2020-02-26 DIAGNOSIS — Z170 Estrogen receptor positive status [ER+]: Secondary | ICD-10-CM | POA: Diagnosis not present

## 2020-02-26 DIAGNOSIS — Z961 Presence of intraocular lens: Secondary | ICD-10-CM | POA: Diagnosis not present

## 2020-02-26 DIAGNOSIS — E785 Hyperlipidemia, unspecified: Secondary | ICD-10-CM | POA: Diagnosis not present

## 2020-02-26 DIAGNOSIS — H40053 Ocular hypertension, bilateral: Secondary | ICD-10-CM | POA: Diagnosis not present

## 2020-02-26 DIAGNOSIS — E119 Type 2 diabetes mellitus without complications: Secondary | ICD-10-CM | POA: Diagnosis not present

## 2020-02-26 DIAGNOSIS — C50411 Malignant neoplasm of upper-outer quadrant of right female breast: Secondary | ICD-10-CM | POA: Diagnosis not present

## 2020-02-26 DIAGNOSIS — I2510 Atherosclerotic heart disease of native coronary artery without angina pectoris: Secondary | ICD-10-CM | POA: Diagnosis not present

## 2020-02-26 DIAGNOSIS — I10 Essential (primary) hypertension: Secondary | ICD-10-CM | POA: Diagnosis not present

## 2020-02-27 DIAGNOSIS — Z955 Presence of coronary angioplasty implant and graft: Secondary | ICD-10-CM | POA: Diagnosis not present

## 2020-02-27 DIAGNOSIS — Z79811 Long term (current) use of aromatase inhibitors: Secondary | ICD-10-CM | POA: Diagnosis not present

## 2020-02-27 DIAGNOSIS — I2510 Atherosclerotic heart disease of native coronary artery without angina pectoris: Secondary | ICD-10-CM | POA: Diagnosis not present

## 2020-02-27 DIAGNOSIS — Z7982 Long term (current) use of aspirin: Secondary | ICD-10-CM | POA: Diagnosis not present

## 2020-02-27 DIAGNOSIS — Z79899 Other long term (current) drug therapy: Secondary | ICD-10-CM | POA: Diagnosis not present

## 2020-02-27 DIAGNOSIS — Z923 Personal history of irradiation: Secondary | ICD-10-CM | POA: Diagnosis not present

## 2020-02-27 DIAGNOSIS — Z7984 Long term (current) use of oral hypoglycemic drugs: Secondary | ICD-10-CM | POA: Diagnosis not present

## 2020-02-27 DIAGNOSIS — E119 Type 2 diabetes mellitus without complications: Secondary | ICD-10-CM | POA: Diagnosis not present

## 2020-02-27 DIAGNOSIS — S2001XA Contusion of right breast, initial encounter: Secondary | ICD-10-CM | POA: Diagnosis not present

## 2020-02-27 DIAGNOSIS — I10 Essential (primary) hypertension: Secondary | ICD-10-CM | POA: Diagnosis not present

## 2020-02-27 DIAGNOSIS — Z853 Personal history of malignant neoplasm of breast: Secondary | ICD-10-CM | POA: Diagnosis not present

## 2020-02-27 DIAGNOSIS — I252 Old myocardial infarction: Secondary | ICD-10-CM | POA: Diagnosis not present

## 2020-03-01 DIAGNOSIS — I252 Old myocardial infarction: Secondary | ICD-10-CM | POA: Diagnosis not present

## 2020-03-01 DIAGNOSIS — Z79811 Long term (current) use of aromatase inhibitors: Secondary | ICD-10-CM | POA: Diagnosis not present

## 2020-03-01 DIAGNOSIS — Z955 Presence of coronary angioplasty implant and graft: Secondary | ICD-10-CM | POA: Diagnosis not present

## 2020-03-01 DIAGNOSIS — Z853 Personal history of malignant neoplasm of breast: Secondary | ICD-10-CM | POA: Diagnosis not present

## 2020-03-01 DIAGNOSIS — S2001XA Contusion of right breast, initial encounter: Secondary | ICD-10-CM | POA: Diagnosis not present

## 2020-03-01 DIAGNOSIS — I10 Essential (primary) hypertension: Secondary | ICD-10-CM | POA: Diagnosis not present

## 2020-03-01 DIAGNOSIS — Z79899 Other long term (current) drug therapy: Secondary | ICD-10-CM | POA: Diagnosis not present

## 2020-03-01 DIAGNOSIS — Z7984 Long term (current) use of oral hypoglycemic drugs: Secondary | ICD-10-CM | POA: Diagnosis not present

## 2020-03-01 DIAGNOSIS — E119 Type 2 diabetes mellitus without complications: Secondary | ICD-10-CM | POA: Diagnosis not present

## 2020-03-01 DIAGNOSIS — Z923 Personal history of irradiation: Secondary | ICD-10-CM | POA: Diagnosis not present

## 2020-03-01 DIAGNOSIS — I2510 Atherosclerotic heart disease of native coronary artery without angina pectoris: Secondary | ICD-10-CM | POA: Diagnosis not present

## 2020-03-01 DIAGNOSIS — Z7982 Long term (current) use of aspirin: Secondary | ICD-10-CM | POA: Diagnosis not present

## 2020-03-02 DIAGNOSIS — I2510 Atherosclerotic heart disease of native coronary artery without angina pectoris: Secondary | ICD-10-CM | POA: Diagnosis not present

## 2020-03-02 DIAGNOSIS — Z170 Estrogen receptor positive status [ER+]: Secondary | ICD-10-CM | POA: Diagnosis not present

## 2020-03-02 DIAGNOSIS — Z955 Presence of coronary angioplasty implant and graft: Secondary | ICD-10-CM | POA: Diagnosis not present

## 2020-03-02 DIAGNOSIS — I252 Old myocardial infarction: Secondary | ICD-10-CM | POA: Diagnosis not present

## 2020-03-02 DIAGNOSIS — C50411 Malignant neoplasm of upper-outer quadrant of right female breast: Secondary | ICD-10-CM | POA: Diagnosis not present

## 2020-03-02 DIAGNOSIS — I10 Essential (primary) hypertension: Secondary | ICD-10-CM | POA: Diagnosis not present

## 2020-03-02 DIAGNOSIS — E119 Type 2 diabetes mellitus without complications: Secondary | ICD-10-CM | POA: Diagnosis not present

## 2020-03-02 DIAGNOSIS — E785 Hyperlipidemia, unspecified: Secondary | ICD-10-CM | POA: Diagnosis not present

## 2020-03-03 DIAGNOSIS — I2510 Atherosclerotic heart disease of native coronary artery without angina pectoris: Secondary | ICD-10-CM | POA: Diagnosis not present

## 2020-03-03 DIAGNOSIS — Z7984 Long term (current) use of oral hypoglycemic drugs: Secondary | ICD-10-CM | POA: Diagnosis not present

## 2020-03-03 DIAGNOSIS — Z923 Personal history of irradiation: Secondary | ICD-10-CM | POA: Diagnosis not present

## 2020-03-03 DIAGNOSIS — Z7982 Long term (current) use of aspirin: Secondary | ICD-10-CM | POA: Diagnosis not present

## 2020-03-03 DIAGNOSIS — Z79811 Long term (current) use of aromatase inhibitors: Secondary | ICD-10-CM | POA: Diagnosis not present

## 2020-03-03 DIAGNOSIS — Z955 Presence of coronary angioplasty implant and graft: Secondary | ICD-10-CM | POA: Diagnosis not present

## 2020-03-03 DIAGNOSIS — S2001XA Contusion of right breast, initial encounter: Secondary | ICD-10-CM | POA: Diagnosis not present

## 2020-03-03 DIAGNOSIS — Z79899 Other long term (current) drug therapy: Secondary | ICD-10-CM | POA: Diagnosis not present

## 2020-03-03 DIAGNOSIS — I252 Old myocardial infarction: Secondary | ICD-10-CM | POA: Diagnosis not present

## 2020-03-03 DIAGNOSIS — I10 Essential (primary) hypertension: Secondary | ICD-10-CM | POA: Diagnosis not present

## 2020-03-03 DIAGNOSIS — E119 Type 2 diabetes mellitus without complications: Secondary | ICD-10-CM | POA: Diagnosis not present

## 2020-03-03 DIAGNOSIS — Z853 Personal history of malignant neoplasm of breast: Secondary | ICD-10-CM | POA: Diagnosis not present

## 2020-03-05 DIAGNOSIS — S2001XA Contusion of right breast, initial encounter: Secondary | ICD-10-CM | POA: Diagnosis not present

## 2020-03-05 DIAGNOSIS — Z7984 Long term (current) use of oral hypoglycemic drugs: Secondary | ICD-10-CM | POA: Diagnosis not present

## 2020-03-05 DIAGNOSIS — Z853 Personal history of malignant neoplasm of breast: Secondary | ICD-10-CM | POA: Diagnosis not present

## 2020-03-05 DIAGNOSIS — Z955 Presence of coronary angioplasty implant and graft: Secondary | ICD-10-CM | POA: Diagnosis not present

## 2020-03-05 DIAGNOSIS — I252 Old myocardial infarction: Secondary | ICD-10-CM | POA: Diagnosis not present

## 2020-03-05 DIAGNOSIS — Z79899 Other long term (current) drug therapy: Secondary | ICD-10-CM | POA: Diagnosis not present

## 2020-03-05 DIAGNOSIS — I2510 Atherosclerotic heart disease of native coronary artery without angina pectoris: Secondary | ICD-10-CM | POA: Diagnosis not present

## 2020-03-05 DIAGNOSIS — Z7982 Long term (current) use of aspirin: Secondary | ICD-10-CM | POA: Diagnosis not present

## 2020-03-05 DIAGNOSIS — E119 Type 2 diabetes mellitus without complications: Secondary | ICD-10-CM | POA: Diagnosis not present

## 2020-03-05 DIAGNOSIS — I10 Essential (primary) hypertension: Secondary | ICD-10-CM | POA: Diagnosis not present

## 2020-03-05 DIAGNOSIS — Z923 Personal history of irradiation: Secondary | ICD-10-CM | POA: Diagnosis not present

## 2020-03-05 DIAGNOSIS — Z79811 Long term (current) use of aromatase inhibitors: Secondary | ICD-10-CM | POA: Diagnosis not present

## 2020-03-08 DIAGNOSIS — I2510 Atherosclerotic heart disease of native coronary artery without angina pectoris: Secondary | ICD-10-CM | POA: Diagnosis not present

## 2020-03-08 DIAGNOSIS — Z853 Personal history of malignant neoplasm of breast: Secondary | ICD-10-CM | POA: Diagnosis not present

## 2020-03-08 DIAGNOSIS — S2001XA Contusion of right breast, initial encounter: Secondary | ICD-10-CM | POA: Diagnosis not present

## 2020-03-08 DIAGNOSIS — E785 Hyperlipidemia, unspecified: Secondary | ICD-10-CM | POA: Diagnosis not present

## 2020-03-08 DIAGNOSIS — Z7984 Long term (current) use of oral hypoglycemic drugs: Secondary | ICD-10-CM | POA: Diagnosis not present

## 2020-03-08 DIAGNOSIS — Z170 Estrogen receptor positive status [ER+]: Secondary | ICD-10-CM | POA: Diagnosis not present

## 2020-03-08 DIAGNOSIS — Z923 Personal history of irradiation: Secondary | ICD-10-CM | POA: Diagnosis not present

## 2020-03-08 DIAGNOSIS — Z79899 Other long term (current) drug therapy: Secondary | ICD-10-CM | POA: Diagnosis not present

## 2020-03-08 DIAGNOSIS — I252 Old myocardial infarction: Secondary | ICD-10-CM | POA: Diagnosis not present

## 2020-03-08 DIAGNOSIS — Z79811 Long term (current) use of aromatase inhibitors: Secondary | ICD-10-CM | POA: Diagnosis not present

## 2020-03-08 DIAGNOSIS — E119 Type 2 diabetes mellitus without complications: Secondary | ICD-10-CM | POA: Diagnosis not present

## 2020-03-08 DIAGNOSIS — Z7982 Long term (current) use of aspirin: Secondary | ICD-10-CM | POA: Diagnosis not present

## 2020-03-08 DIAGNOSIS — Z955 Presence of coronary angioplasty implant and graft: Secondary | ICD-10-CM | POA: Diagnosis not present

## 2020-03-08 DIAGNOSIS — C50411 Malignant neoplasm of upper-outer quadrant of right female breast: Secondary | ICD-10-CM | POA: Diagnosis not present

## 2020-03-08 DIAGNOSIS — I10 Essential (primary) hypertension: Secondary | ICD-10-CM | POA: Diagnosis not present

## 2020-03-09 DIAGNOSIS — E119 Type 2 diabetes mellitus without complications: Secondary | ICD-10-CM | POA: Diagnosis not present

## 2020-03-09 DIAGNOSIS — C50411 Malignant neoplasm of upper-outer quadrant of right female breast: Secondary | ICD-10-CM | POA: Diagnosis not present

## 2020-03-09 DIAGNOSIS — I2510 Atherosclerotic heart disease of native coronary artery without angina pectoris: Secondary | ICD-10-CM | POA: Diagnosis not present

## 2020-03-09 DIAGNOSIS — I10 Essential (primary) hypertension: Secondary | ICD-10-CM | POA: Diagnosis not present

## 2020-03-09 DIAGNOSIS — E785 Hyperlipidemia, unspecified: Secondary | ICD-10-CM | POA: Diagnosis not present

## 2020-03-09 DIAGNOSIS — I252 Old myocardial infarction: Secondary | ICD-10-CM | POA: Diagnosis not present

## 2020-03-09 DIAGNOSIS — Z955 Presence of coronary angioplasty implant and graft: Secondary | ICD-10-CM | POA: Diagnosis not present

## 2020-03-09 DIAGNOSIS — Z170 Estrogen receptor positive status [ER+]: Secondary | ICD-10-CM | POA: Diagnosis not present

## 2020-03-10 DIAGNOSIS — C50411 Malignant neoplasm of upper-outer quadrant of right female breast: Secondary | ICD-10-CM | POA: Diagnosis not present

## 2020-03-10 DIAGNOSIS — Z955 Presence of coronary angioplasty implant and graft: Secondary | ICD-10-CM | POA: Diagnosis not present

## 2020-03-10 DIAGNOSIS — E119 Type 2 diabetes mellitus without complications: Secondary | ICD-10-CM | POA: Diagnosis not present

## 2020-03-10 DIAGNOSIS — Z170 Estrogen receptor positive status [ER+]: Secondary | ICD-10-CM | POA: Diagnosis not present

## 2020-03-10 DIAGNOSIS — I252 Old myocardial infarction: Secondary | ICD-10-CM | POA: Diagnosis not present

## 2020-03-10 DIAGNOSIS — E785 Hyperlipidemia, unspecified: Secondary | ICD-10-CM | POA: Diagnosis not present

## 2020-03-10 DIAGNOSIS — I10 Essential (primary) hypertension: Secondary | ICD-10-CM | POA: Diagnosis not present

## 2020-03-10 DIAGNOSIS — I2510 Atherosclerotic heart disease of native coronary artery without angina pectoris: Secondary | ICD-10-CM | POA: Diagnosis not present

## 2020-03-11 DIAGNOSIS — Z955 Presence of coronary angioplasty implant and graft: Secondary | ICD-10-CM | POA: Diagnosis not present

## 2020-03-11 DIAGNOSIS — Z7984 Long term (current) use of oral hypoglycemic drugs: Secondary | ICD-10-CM | POA: Diagnosis not present

## 2020-03-11 DIAGNOSIS — Z7982 Long term (current) use of aspirin: Secondary | ICD-10-CM | POA: Diagnosis not present

## 2020-03-11 DIAGNOSIS — Z79899 Other long term (current) drug therapy: Secondary | ICD-10-CM | POA: Diagnosis not present

## 2020-03-11 DIAGNOSIS — E119 Type 2 diabetes mellitus without complications: Secondary | ICD-10-CM | POA: Diagnosis not present

## 2020-03-11 DIAGNOSIS — Z923 Personal history of irradiation: Secondary | ICD-10-CM | POA: Diagnosis not present

## 2020-03-11 DIAGNOSIS — Z79811 Long term (current) use of aromatase inhibitors: Secondary | ICD-10-CM | POA: Diagnosis not present

## 2020-03-11 DIAGNOSIS — C50411 Malignant neoplasm of upper-outer quadrant of right female breast: Secondary | ICD-10-CM | POA: Diagnosis not present

## 2020-03-11 DIAGNOSIS — S2001XA Contusion of right breast, initial encounter: Secondary | ICD-10-CM | POA: Diagnosis not present

## 2020-03-11 DIAGNOSIS — H3561 Retinal hemorrhage, right eye: Secondary | ICD-10-CM | POA: Diagnosis not present

## 2020-03-11 DIAGNOSIS — I252 Old myocardial infarction: Secondary | ICD-10-CM | POA: Diagnosis not present

## 2020-03-11 DIAGNOSIS — I2510 Atherosclerotic heart disease of native coronary artery without angina pectoris: Secondary | ICD-10-CM | POA: Diagnosis not present

## 2020-03-11 DIAGNOSIS — H35033 Hypertensive retinopathy, bilateral: Secondary | ICD-10-CM | POA: Diagnosis not present

## 2020-03-11 DIAGNOSIS — E113313 Type 2 diabetes mellitus with moderate nonproliferative diabetic retinopathy with macular edema, bilateral: Secondary | ICD-10-CM | POA: Diagnosis not present

## 2020-03-11 DIAGNOSIS — Z170 Estrogen receptor positive status [ER+]: Secondary | ICD-10-CM | POA: Diagnosis not present

## 2020-03-11 DIAGNOSIS — Z853 Personal history of malignant neoplasm of breast: Secondary | ICD-10-CM | POA: Diagnosis not present

## 2020-03-11 DIAGNOSIS — E785 Hyperlipidemia, unspecified: Secondary | ICD-10-CM | POA: Diagnosis not present

## 2020-03-11 DIAGNOSIS — I10 Essential (primary) hypertension: Secondary | ICD-10-CM | POA: Diagnosis not present

## 2020-03-12 DIAGNOSIS — Z170 Estrogen receptor positive status [ER+]: Secondary | ICD-10-CM | POA: Diagnosis not present

## 2020-03-12 DIAGNOSIS — E119 Type 2 diabetes mellitus without complications: Secondary | ICD-10-CM | POA: Diagnosis not present

## 2020-03-12 DIAGNOSIS — I252 Old myocardial infarction: Secondary | ICD-10-CM | POA: Diagnosis not present

## 2020-03-12 DIAGNOSIS — I2510 Atherosclerotic heart disease of native coronary artery without angina pectoris: Secondary | ICD-10-CM | POA: Diagnosis not present

## 2020-03-12 DIAGNOSIS — E785 Hyperlipidemia, unspecified: Secondary | ICD-10-CM | POA: Diagnosis not present

## 2020-03-12 DIAGNOSIS — C50411 Malignant neoplasm of upper-outer quadrant of right female breast: Secondary | ICD-10-CM | POA: Diagnosis not present

## 2020-03-12 DIAGNOSIS — Z955 Presence of coronary angioplasty implant and graft: Secondary | ICD-10-CM | POA: Diagnosis not present

## 2020-03-12 DIAGNOSIS — I10 Essential (primary) hypertension: Secondary | ICD-10-CM | POA: Diagnosis not present

## 2020-03-15 DIAGNOSIS — Z79811 Long term (current) use of aromatase inhibitors: Secondary | ICD-10-CM | POA: Diagnosis not present

## 2020-03-15 DIAGNOSIS — Z923 Personal history of irradiation: Secondary | ICD-10-CM | POA: Diagnosis not present

## 2020-03-15 DIAGNOSIS — Z170 Estrogen receptor positive status [ER+]: Secondary | ICD-10-CM | POA: Diagnosis not present

## 2020-03-15 DIAGNOSIS — Z7982 Long term (current) use of aspirin: Secondary | ICD-10-CM | POA: Diagnosis not present

## 2020-03-15 DIAGNOSIS — Z7984 Long term (current) use of oral hypoglycemic drugs: Secondary | ICD-10-CM | POA: Diagnosis not present

## 2020-03-15 DIAGNOSIS — Z79899 Other long term (current) drug therapy: Secondary | ICD-10-CM | POA: Diagnosis not present

## 2020-03-15 DIAGNOSIS — Z955 Presence of coronary angioplasty implant and graft: Secondary | ICD-10-CM | POA: Diagnosis not present

## 2020-03-15 DIAGNOSIS — E119 Type 2 diabetes mellitus without complications: Secondary | ICD-10-CM | POA: Diagnosis not present

## 2020-03-15 DIAGNOSIS — S2001XA Contusion of right breast, initial encounter: Secondary | ICD-10-CM | POA: Diagnosis not present

## 2020-03-15 DIAGNOSIS — I10 Essential (primary) hypertension: Secondary | ICD-10-CM | POA: Diagnosis not present

## 2020-03-15 DIAGNOSIS — I252 Old myocardial infarction: Secondary | ICD-10-CM | POA: Diagnosis not present

## 2020-03-15 DIAGNOSIS — I2510 Atherosclerotic heart disease of native coronary artery without angina pectoris: Secondary | ICD-10-CM | POA: Diagnosis not present

## 2020-03-15 DIAGNOSIS — C50411 Malignant neoplasm of upper-outer quadrant of right female breast: Secondary | ICD-10-CM | POA: Diagnosis not present

## 2020-03-15 DIAGNOSIS — Z853 Personal history of malignant neoplasm of breast: Secondary | ICD-10-CM | POA: Diagnosis not present

## 2020-03-15 DIAGNOSIS — E785 Hyperlipidemia, unspecified: Secondary | ICD-10-CM | POA: Diagnosis not present

## 2020-03-16 DIAGNOSIS — E785 Hyperlipidemia, unspecified: Secondary | ICD-10-CM | POA: Diagnosis not present

## 2020-03-16 DIAGNOSIS — E119 Type 2 diabetes mellitus without complications: Secondary | ICD-10-CM | POA: Diagnosis not present

## 2020-03-16 DIAGNOSIS — I252 Old myocardial infarction: Secondary | ICD-10-CM | POA: Diagnosis not present

## 2020-03-16 DIAGNOSIS — I10 Essential (primary) hypertension: Secondary | ICD-10-CM | POA: Diagnosis not present

## 2020-03-16 DIAGNOSIS — Z170 Estrogen receptor positive status [ER+]: Secondary | ICD-10-CM | POA: Diagnosis not present

## 2020-03-16 DIAGNOSIS — I2510 Atherosclerotic heart disease of native coronary artery without angina pectoris: Secondary | ICD-10-CM | POA: Diagnosis not present

## 2020-03-16 DIAGNOSIS — C50411 Malignant neoplasm of upper-outer quadrant of right female breast: Secondary | ICD-10-CM | POA: Diagnosis not present

## 2020-03-16 DIAGNOSIS — Z955 Presence of coronary angioplasty implant and graft: Secondary | ICD-10-CM | POA: Diagnosis not present

## 2020-03-17 ENCOUNTER — Ambulatory Visit: Payer: Medicare Other | Admitting: Podiatry

## 2020-03-17 DIAGNOSIS — I2510 Atherosclerotic heart disease of native coronary artery without angina pectoris: Secondary | ICD-10-CM | POA: Diagnosis not present

## 2020-03-17 DIAGNOSIS — S2001XA Contusion of right breast, initial encounter: Secondary | ICD-10-CM | POA: Diagnosis not present

## 2020-03-17 DIAGNOSIS — E119 Type 2 diabetes mellitus without complications: Secondary | ICD-10-CM | POA: Diagnosis not present

## 2020-03-17 DIAGNOSIS — E785 Hyperlipidemia, unspecified: Secondary | ICD-10-CM | POA: Diagnosis not present

## 2020-03-17 DIAGNOSIS — I252 Old myocardial infarction: Secondary | ICD-10-CM | POA: Diagnosis not present

## 2020-03-17 DIAGNOSIS — Z7984 Long term (current) use of oral hypoglycemic drugs: Secondary | ICD-10-CM | POA: Diagnosis not present

## 2020-03-17 DIAGNOSIS — Z853 Personal history of malignant neoplasm of breast: Secondary | ICD-10-CM | POA: Diagnosis not present

## 2020-03-17 DIAGNOSIS — C50411 Malignant neoplasm of upper-outer quadrant of right female breast: Secondary | ICD-10-CM | POA: Diagnosis not present

## 2020-03-17 DIAGNOSIS — Z170 Estrogen receptor positive status [ER+]: Secondary | ICD-10-CM | POA: Diagnosis not present

## 2020-03-17 DIAGNOSIS — Z79899 Other long term (current) drug therapy: Secondary | ICD-10-CM | POA: Diagnosis not present

## 2020-03-17 DIAGNOSIS — I10 Essential (primary) hypertension: Secondary | ICD-10-CM | POA: Diagnosis not present

## 2020-03-17 DIAGNOSIS — Z923 Personal history of irradiation: Secondary | ICD-10-CM | POA: Diagnosis not present

## 2020-03-17 DIAGNOSIS — Z7982 Long term (current) use of aspirin: Secondary | ICD-10-CM | POA: Diagnosis not present

## 2020-03-17 DIAGNOSIS — N6489 Other specified disorders of breast: Secondary | ICD-10-CM | POA: Diagnosis not present

## 2020-03-17 DIAGNOSIS — Z79811 Long term (current) use of aromatase inhibitors: Secondary | ICD-10-CM | POA: Diagnosis not present

## 2020-03-17 DIAGNOSIS — Z955 Presence of coronary angioplasty implant and graft: Secondary | ICD-10-CM | POA: Diagnosis not present

## 2020-03-18 DIAGNOSIS — S2001XA Contusion of right breast, initial encounter: Secondary | ICD-10-CM | POA: Diagnosis not present

## 2020-03-18 DIAGNOSIS — C50411 Malignant neoplasm of upper-outer quadrant of right female breast: Secondary | ICD-10-CM | POA: Diagnosis not present

## 2020-03-18 DIAGNOSIS — I252 Old myocardial infarction: Secondary | ICD-10-CM | POA: Diagnosis not present

## 2020-03-18 DIAGNOSIS — Z7982 Long term (current) use of aspirin: Secondary | ICD-10-CM | POA: Diagnosis not present

## 2020-03-18 DIAGNOSIS — I10 Essential (primary) hypertension: Secondary | ICD-10-CM | POA: Diagnosis not present

## 2020-03-18 DIAGNOSIS — I2510 Atherosclerotic heart disease of native coronary artery without angina pectoris: Secondary | ICD-10-CM | POA: Diagnosis not present

## 2020-03-18 DIAGNOSIS — Z955 Presence of coronary angioplasty implant and graft: Secondary | ICD-10-CM | POA: Diagnosis not present

## 2020-03-18 DIAGNOSIS — Z853 Personal history of malignant neoplasm of breast: Secondary | ICD-10-CM | POA: Diagnosis not present

## 2020-03-18 DIAGNOSIS — Z79811 Long term (current) use of aromatase inhibitors: Secondary | ICD-10-CM | POA: Diagnosis not present

## 2020-03-18 DIAGNOSIS — E785 Hyperlipidemia, unspecified: Secondary | ICD-10-CM | POA: Diagnosis not present

## 2020-03-18 DIAGNOSIS — Z7984 Long term (current) use of oral hypoglycemic drugs: Secondary | ICD-10-CM | POA: Diagnosis not present

## 2020-03-18 DIAGNOSIS — Z923 Personal history of irradiation: Secondary | ICD-10-CM | POA: Diagnosis not present

## 2020-03-18 DIAGNOSIS — Z79899 Other long term (current) drug therapy: Secondary | ICD-10-CM | POA: Diagnosis not present

## 2020-03-18 DIAGNOSIS — E119 Type 2 diabetes mellitus without complications: Secondary | ICD-10-CM | POA: Diagnosis not present

## 2020-03-18 DIAGNOSIS — Z170 Estrogen receptor positive status [ER+]: Secondary | ICD-10-CM | POA: Diagnosis not present

## 2020-03-19 ENCOUNTER — Encounter: Payer: Self-pay | Admitting: Podiatry

## 2020-03-19 ENCOUNTER — Other Ambulatory Visit: Payer: Self-pay

## 2020-03-19 ENCOUNTER — Ambulatory Visit (INDEPENDENT_AMBULATORY_CARE_PROVIDER_SITE_OTHER): Payer: Medicare Other | Admitting: Podiatry

## 2020-03-19 DIAGNOSIS — L608 Other nail disorders: Secondary | ICD-10-CM

## 2020-03-19 DIAGNOSIS — M79675 Pain in left toe(s): Secondary | ICD-10-CM

## 2020-03-19 DIAGNOSIS — C50411 Malignant neoplasm of upper-outer quadrant of right female breast: Secondary | ICD-10-CM | POA: Diagnosis not present

## 2020-03-19 DIAGNOSIS — Z955 Presence of coronary angioplasty implant and graft: Secondary | ICD-10-CM | POA: Diagnosis not present

## 2020-03-19 DIAGNOSIS — I2510 Atherosclerotic heart disease of native coronary artery without angina pectoris: Secondary | ICD-10-CM | POA: Diagnosis not present

## 2020-03-19 DIAGNOSIS — L853 Xerosis cutis: Secondary | ICD-10-CM | POA: Diagnosis not present

## 2020-03-19 DIAGNOSIS — E785 Hyperlipidemia, unspecified: Secondary | ICD-10-CM | POA: Diagnosis not present

## 2020-03-19 DIAGNOSIS — E1165 Type 2 diabetes mellitus with hyperglycemia: Secondary | ICD-10-CM

## 2020-03-19 DIAGNOSIS — B351 Tinea unguium: Secondary | ICD-10-CM | POA: Diagnosis not present

## 2020-03-19 DIAGNOSIS — M79674 Pain in right toe(s): Secondary | ICD-10-CM

## 2020-03-19 DIAGNOSIS — Z170 Estrogen receptor positive status [ER+]: Secondary | ICD-10-CM | POA: Diagnosis not present

## 2020-03-19 DIAGNOSIS — E119 Type 2 diabetes mellitus without complications: Secondary | ICD-10-CM | POA: Diagnosis not present

## 2020-03-19 DIAGNOSIS — I10 Essential (primary) hypertension: Secondary | ICD-10-CM | POA: Diagnosis not present

## 2020-03-19 DIAGNOSIS — I252 Old myocardial infarction: Secondary | ICD-10-CM | POA: Diagnosis not present

## 2020-03-19 MED ORDER — AMMONIUM LACTATE 10 % EX LOTN: TOPICAL_LOTION | CUTANEOUS | 2 refills | Status: DC

## 2020-03-19 NOTE — Progress Notes (Signed)
  Subjective:  Patient ID: Sandra Schneider, female    DOB: 1955-09-23,  MRN: [id number]  Chief Complaint  Patient presents with  . diabetic foot care    nail trim     64 y.o. female presents with the above complaint. History confirmed with patient.  She normally sees Dr. Prudence Davidson for her care  Objective:  Physical Exam: Very dry skin is present on both feet, there is dystrophic pincer nail deformities with onychomycosis x10.  Painful to touch.  Palpable pulses, the skin is warm well perfused Assessment:   1. Pain due to onychomycosis of toenails of both feet   2. Pincer nail deformity   3. Type 2 diabetes mellitus with hyperglycemia, without long-term current use of insulin ([place])   4. Xerosis cutis      Plan:  Patient was evaluated and treated and all questions answered.  Patient educated on diabetes. Discussed proper diabetic foot care and discussed risks and complications of disease. Educated patient in depth on reasons to return to the office immediately should he/she discover anything concerning or new on the feet. All questions answered. Discussed proper shoes as well.   Discussed the etiology and treatment options for the condition in detail with the patient. Educated patient on the topical and oral treatment options for mycotic nails. Recommended debridement of the nails today. Sharp and mechanical debridement performed of all painful and mycotic nails today. Nails debrided in length and thickness using a nail nipper and a mechanical burr to level of comfort. Discussed treatment options including appropriate shoe gear. Follow up as needed for painful nails.  Rx for AmLactin sent to pharmacy  Return in about 3 months (around 06/19/2020) for [facility].

## 2020-03-22 DIAGNOSIS — Z79899 Other long term (current) drug therapy: Secondary | ICD-10-CM | POA: Diagnosis not present

## 2020-03-22 DIAGNOSIS — I2510 Atherosclerotic heart disease of native coronary artery without angina pectoris: Secondary | ICD-10-CM | POA: Diagnosis not present

## 2020-03-22 DIAGNOSIS — Z955 Presence of coronary angioplasty implant and graft: Secondary | ICD-10-CM | POA: Diagnosis not present

## 2020-03-22 DIAGNOSIS — Z923 Personal history of irradiation: Secondary | ICD-10-CM | POA: Diagnosis not present

## 2020-03-22 DIAGNOSIS — E119 Type 2 diabetes mellitus without complications: Secondary | ICD-10-CM | POA: Diagnosis not present

## 2020-03-22 DIAGNOSIS — E785 Hyperlipidemia, unspecified: Secondary | ICD-10-CM | POA: Diagnosis not present

## 2020-03-22 DIAGNOSIS — Z7984 Long term (current) use of oral hypoglycemic drugs: Secondary | ICD-10-CM | POA: Diagnosis not present

## 2020-03-22 DIAGNOSIS — Z853 Personal history of malignant neoplasm of breast: Secondary | ICD-10-CM | POA: Diagnosis not present

## 2020-03-22 DIAGNOSIS — S2001XA Contusion of right breast, initial encounter: Secondary | ICD-10-CM | POA: Diagnosis not present

## 2020-03-22 DIAGNOSIS — I10 Essential (primary) hypertension: Secondary | ICD-10-CM | POA: Diagnosis not present

## 2020-03-22 DIAGNOSIS — C50411 Malignant neoplasm of upper-outer quadrant of right female breast: Secondary | ICD-10-CM | POA: Diagnosis not present

## 2020-03-22 DIAGNOSIS — I252 Old myocardial infarction: Secondary | ICD-10-CM | POA: Diagnosis not present

## 2020-03-22 DIAGNOSIS — Z79811 Long term (current) use of aromatase inhibitors: Secondary | ICD-10-CM | POA: Diagnosis not present

## 2020-03-22 DIAGNOSIS — Z170 Estrogen receptor positive status [ER+]: Secondary | ICD-10-CM | POA: Diagnosis not present

## 2020-03-22 DIAGNOSIS — Z7982 Long term (current) use of aspirin: Secondary | ICD-10-CM | POA: Diagnosis not present

## 2020-03-23 DIAGNOSIS — Z170 Estrogen receptor positive status [ER+]: Secondary | ICD-10-CM | POA: Diagnosis not present

## 2020-03-23 DIAGNOSIS — E119 Type 2 diabetes mellitus without complications: Secondary | ICD-10-CM | POA: Diagnosis not present

## 2020-03-23 DIAGNOSIS — Z955 Presence of coronary angioplasty implant and graft: Secondary | ICD-10-CM | POA: Diagnosis not present

## 2020-03-23 DIAGNOSIS — C50411 Malignant neoplasm of upper-outer quadrant of right female breast: Secondary | ICD-10-CM | POA: Diagnosis not present

## 2020-03-23 DIAGNOSIS — I10 Essential (primary) hypertension: Secondary | ICD-10-CM | POA: Diagnosis not present

## 2020-03-23 DIAGNOSIS — E785 Hyperlipidemia, unspecified: Secondary | ICD-10-CM | POA: Diagnosis not present

## 2020-03-23 DIAGNOSIS — I2510 Atherosclerotic heart disease of native coronary artery without angina pectoris: Secondary | ICD-10-CM | POA: Diagnosis not present

## 2020-03-23 DIAGNOSIS — I252 Old myocardial infarction: Secondary | ICD-10-CM | POA: Diagnosis not present

## 2020-03-24 DIAGNOSIS — Z170 Estrogen receptor positive status [ER+]: Secondary | ICD-10-CM | POA: Diagnosis not present

## 2020-03-24 DIAGNOSIS — E119 Type 2 diabetes mellitus without complications: Secondary | ICD-10-CM | POA: Diagnosis not present

## 2020-03-24 DIAGNOSIS — I2510 Atherosclerotic heart disease of native coronary artery without angina pectoris: Secondary | ICD-10-CM | POA: Diagnosis not present

## 2020-03-24 DIAGNOSIS — Z955 Presence of coronary angioplasty implant and graft: Secondary | ICD-10-CM | POA: Diagnosis not present

## 2020-03-24 DIAGNOSIS — I252 Old myocardial infarction: Secondary | ICD-10-CM | POA: Diagnosis not present

## 2020-03-24 DIAGNOSIS — I10 Essential (primary) hypertension: Secondary | ICD-10-CM | POA: Diagnosis not present

## 2020-03-24 DIAGNOSIS — E785 Hyperlipidemia, unspecified: Secondary | ICD-10-CM | POA: Diagnosis not present

## 2020-03-24 DIAGNOSIS — C50411 Malignant neoplasm of upper-outer quadrant of right female breast: Secondary | ICD-10-CM | POA: Diagnosis not present

## 2020-03-25 DIAGNOSIS — Z79811 Long term (current) use of aromatase inhibitors: Secondary | ICD-10-CM | POA: Diagnosis not present

## 2020-03-25 DIAGNOSIS — E785 Hyperlipidemia, unspecified: Secondary | ICD-10-CM | POA: Diagnosis not present

## 2020-03-25 DIAGNOSIS — Z7984 Long term (current) use of oral hypoglycemic drugs: Secondary | ICD-10-CM | POA: Diagnosis not present

## 2020-03-25 DIAGNOSIS — Z923 Personal history of irradiation: Secondary | ICD-10-CM | POA: Diagnosis not present

## 2020-03-25 DIAGNOSIS — Z955 Presence of coronary angioplasty implant and graft: Secondary | ICD-10-CM | POA: Diagnosis not present

## 2020-03-25 DIAGNOSIS — Z79899 Other long term (current) drug therapy: Secondary | ICD-10-CM | POA: Diagnosis not present

## 2020-03-25 DIAGNOSIS — I252 Old myocardial infarction: Secondary | ICD-10-CM | POA: Diagnosis not present

## 2020-03-25 DIAGNOSIS — I10 Essential (primary) hypertension: Secondary | ICD-10-CM | POA: Diagnosis not present

## 2020-03-25 DIAGNOSIS — I2510 Atherosclerotic heart disease of native coronary artery without angina pectoris: Secondary | ICD-10-CM | POA: Diagnosis not present

## 2020-03-25 DIAGNOSIS — S2001XA Contusion of right breast, initial encounter: Secondary | ICD-10-CM | POA: Diagnosis not present

## 2020-03-25 DIAGNOSIS — E119 Type 2 diabetes mellitus without complications: Secondary | ICD-10-CM | POA: Diagnosis not present

## 2020-03-25 DIAGNOSIS — Z170 Estrogen receptor positive status [ER+]: Secondary | ICD-10-CM | POA: Diagnosis not present

## 2020-03-25 DIAGNOSIS — C50411 Malignant neoplasm of upper-outer quadrant of right female breast: Secondary | ICD-10-CM | POA: Diagnosis not present

## 2020-03-25 DIAGNOSIS — Z7982 Long term (current) use of aspirin: Secondary | ICD-10-CM | POA: Diagnosis not present

## 2020-03-25 DIAGNOSIS — Z853 Personal history of malignant neoplasm of breast: Secondary | ICD-10-CM | POA: Diagnosis not present

## 2020-03-26 DIAGNOSIS — Z170 Estrogen receptor positive status [ER+]: Secondary | ICD-10-CM | POA: Diagnosis not present

## 2020-03-26 DIAGNOSIS — E785 Hyperlipidemia, unspecified: Secondary | ICD-10-CM | POA: Diagnosis not present

## 2020-03-26 DIAGNOSIS — C50411 Malignant neoplasm of upper-outer quadrant of right female breast: Secondary | ICD-10-CM | POA: Diagnosis not present

## 2020-03-26 DIAGNOSIS — I252 Old myocardial infarction: Secondary | ICD-10-CM | POA: Diagnosis not present

## 2020-03-26 DIAGNOSIS — I10 Essential (primary) hypertension: Secondary | ICD-10-CM | POA: Diagnosis not present

## 2020-03-26 DIAGNOSIS — I2510 Atherosclerotic heart disease of native coronary artery without angina pectoris: Secondary | ICD-10-CM | POA: Diagnosis not present

## 2020-03-26 DIAGNOSIS — E119 Type 2 diabetes mellitus without complications: Secondary | ICD-10-CM | POA: Diagnosis not present

## 2020-03-26 DIAGNOSIS — Z955 Presence of coronary angioplasty implant and graft: Secondary | ICD-10-CM | POA: Diagnosis not present

## 2020-03-29 DIAGNOSIS — S2001XA Contusion of right breast, initial encounter: Secondary | ICD-10-CM | POA: Diagnosis not present

## 2020-03-29 DIAGNOSIS — Z923 Personal history of irradiation: Secondary | ICD-10-CM | POA: Diagnosis not present

## 2020-03-29 DIAGNOSIS — Z79899 Other long term (current) drug therapy: Secondary | ICD-10-CM | POA: Diagnosis not present

## 2020-03-29 DIAGNOSIS — Z79811 Long term (current) use of aromatase inhibitors: Secondary | ICD-10-CM | POA: Diagnosis not present

## 2020-03-29 DIAGNOSIS — Z170 Estrogen receptor positive status [ER+]: Secondary | ICD-10-CM | POA: Diagnosis not present

## 2020-03-29 DIAGNOSIS — Z955 Presence of coronary angioplasty implant and graft: Secondary | ICD-10-CM | POA: Diagnosis not present

## 2020-03-29 DIAGNOSIS — C50919 Malignant neoplasm of unspecified site of unspecified female breast: Secondary | ICD-10-CM | POA: Diagnosis not present

## 2020-03-29 DIAGNOSIS — Z7984 Long term (current) use of oral hypoglycemic drugs: Secondary | ICD-10-CM | POA: Diagnosis not present

## 2020-03-29 DIAGNOSIS — I2510 Atherosclerotic heart disease of native coronary artery without angina pectoris: Secondary | ICD-10-CM | POA: Diagnosis not present

## 2020-03-29 DIAGNOSIS — Z7982 Long term (current) use of aspirin: Secondary | ICD-10-CM | POA: Diagnosis not present

## 2020-03-29 DIAGNOSIS — C50411 Malignant neoplasm of upper-outer quadrant of right female breast: Secondary | ICD-10-CM | POA: Diagnosis not present

## 2020-03-29 DIAGNOSIS — I10 Essential (primary) hypertension: Secondary | ICD-10-CM | POA: Diagnosis not present

## 2020-03-29 DIAGNOSIS — I252 Old myocardial infarction: Secondary | ICD-10-CM | POA: Diagnosis not present

## 2020-03-29 DIAGNOSIS — E785 Hyperlipidemia, unspecified: Secondary | ICD-10-CM | POA: Diagnosis not present

## 2020-03-29 DIAGNOSIS — Z9011 Acquired absence of right breast and nipple: Secondary | ICD-10-CM | POA: Diagnosis not present

## 2020-03-29 DIAGNOSIS — E119 Type 2 diabetes mellitus without complications: Secondary | ICD-10-CM | POA: Diagnosis not present

## 2020-03-30 DIAGNOSIS — Z170 Estrogen receptor positive status [ER+]: Secondary | ICD-10-CM | POA: Diagnosis not present

## 2020-03-30 DIAGNOSIS — I252 Old myocardial infarction: Secondary | ICD-10-CM | POA: Diagnosis not present

## 2020-03-30 DIAGNOSIS — C50411 Malignant neoplasm of upper-outer quadrant of right female breast: Secondary | ICD-10-CM | POA: Diagnosis not present

## 2020-03-30 DIAGNOSIS — I2510 Atherosclerotic heart disease of native coronary artery without angina pectoris: Secondary | ICD-10-CM | POA: Diagnosis not present

## 2020-03-30 DIAGNOSIS — E119 Type 2 diabetes mellitus without complications: Secondary | ICD-10-CM | POA: Diagnosis not present

## 2020-03-30 DIAGNOSIS — I10 Essential (primary) hypertension: Secondary | ICD-10-CM | POA: Diagnosis not present

## 2020-03-30 DIAGNOSIS — E785 Hyperlipidemia, unspecified: Secondary | ICD-10-CM | POA: Diagnosis not present

## 2020-03-30 DIAGNOSIS — Z955 Presence of coronary angioplasty implant and graft: Secondary | ICD-10-CM | POA: Diagnosis not present

## 2020-03-31 DIAGNOSIS — C50411 Malignant neoplasm of upper-outer quadrant of right female breast: Secondary | ICD-10-CM | POA: Diagnosis not present

## 2020-03-31 DIAGNOSIS — Z955 Presence of coronary angioplasty implant and graft: Secondary | ICD-10-CM | POA: Diagnosis not present

## 2020-03-31 DIAGNOSIS — Z170 Estrogen receptor positive status [ER+]: Secondary | ICD-10-CM | POA: Diagnosis not present

## 2020-03-31 DIAGNOSIS — E785 Hyperlipidemia, unspecified: Secondary | ICD-10-CM | POA: Diagnosis not present

## 2020-03-31 DIAGNOSIS — I252 Old myocardial infarction: Secondary | ICD-10-CM | POA: Diagnosis not present

## 2020-03-31 DIAGNOSIS — E119 Type 2 diabetes mellitus without complications: Secondary | ICD-10-CM | POA: Diagnosis not present

## 2020-03-31 DIAGNOSIS — I10 Essential (primary) hypertension: Secondary | ICD-10-CM | POA: Diagnosis not present

## 2020-03-31 DIAGNOSIS — I2510 Atherosclerotic heart disease of native coronary artery without angina pectoris: Secondary | ICD-10-CM | POA: Diagnosis not present

## 2020-04-01 ENCOUNTER — Other Ambulatory Visit: Payer: Self-pay | Admitting: Cardiology

## 2020-04-01 DIAGNOSIS — Z923 Personal history of irradiation: Secondary | ICD-10-CM | POA: Diagnosis not present

## 2020-04-01 DIAGNOSIS — Z79811 Long term (current) use of aromatase inhibitors: Secondary | ICD-10-CM | POA: Diagnosis not present

## 2020-04-01 DIAGNOSIS — E785 Hyperlipidemia, unspecified: Secondary | ICD-10-CM | POA: Diagnosis not present

## 2020-04-01 DIAGNOSIS — Z9011 Acquired absence of right breast and nipple: Secondary | ICD-10-CM | POA: Diagnosis not present

## 2020-04-01 DIAGNOSIS — Z7984 Long term (current) use of oral hypoglycemic drugs: Secondary | ICD-10-CM | POA: Diagnosis not present

## 2020-04-01 DIAGNOSIS — Z7982 Long term (current) use of aspirin: Secondary | ICD-10-CM | POA: Diagnosis not present

## 2020-04-01 DIAGNOSIS — S2001XA Contusion of right breast, initial encounter: Secondary | ICD-10-CM | POA: Diagnosis not present

## 2020-04-01 DIAGNOSIS — Z955 Presence of coronary angioplasty implant and graft: Secondary | ICD-10-CM | POA: Diagnosis not present

## 2020-04-01 DIAGNOSIS — I2510 Atherosclerotic heart disease of native coronary artery without angina pectoris: Secondary | ICD-10-CM | POA: Diagnosis not present

## 2020-04-01 DIAGNOSIS — C50411 Malignant neoplasm of upper-outer quadrant of right female breast: Secondary | ICD-10-CM | POA: Diagnosis not present

## 2020-04-01 DIAGNOSIS — Z170 Estrogen receptor positive status [ER+]: Secondary | ICD-10-CM | POA: Diagnosis not present

## 2020-04-01 DIAGNOSIS — Z79899 Other long term (current) drug therapy: Secondary | ICD-10-CM | POA: Diagnosis not present

## 2020-04-01 DIAGNOSIS — E119 Type 2 diabetes mellitus without complications: Secondary | ICD-10-CM | POA: Diagnosis not present

## 2020-04-01 DIAGNOSIS — I10 Essential (primary) hypertension: Secondary | ICD-10-CM | POA: Diagnosis not present

## 2020-04-01 DIAGNOSIS — C50919 Malignant neoplasm of unspecified site of unspecified female breast: Secondary | ICD-10-CM | POA: Diagnosis not present

## 2020-04-01 DIAGNOSIS — I252 Old myocardial infarction: Secondary | ICD-10-CM | POA: Diagnosis not present

## 2020-04-02 DIAGNOSIS — E785 Hyperlipidemia, unspecified: Secondary | ICD-10-CM | POA: Diagnosis not present

## 2020-04-02 DIAGNOSIS — I252 Old myocardial infarction: Secondary | ICD-10-CM | POA: Diagnosis not present

## 2020-04-02 DIAGNOSIS — E119 Type 2 diabetes mellitus without complications: Secondary | ICD-10-CM | POA: Diagnosis not present

## 2020-04-02 DIAGNOSIS — Z955 Presence of coronary angioplasty implant and graft: Secondary | ICD-10-CM | POA: Diagnosis not present

## 2020-04-02 DIAGNOSIS — I10 Essential (primary) hypertension: Secondary | ICD-10-CM | POA: Diagnosis not present

## 2020-04-02 DIAGNOSIS — I2510 Atherosclerotic heart disease of native coronary artery without angina pectoris: Secondary | ICD-10-CM | POA: Diagnosis not present

## 2020-04-02 DIAGNOSIS — Z170 Estrogen receptor positive status [ER+]: Secondary | ICD-10-CM | POA: Diagnosis not present

## 2020-04-02 DIAGNOSIS — C50411 Malignant neoplasm of upper-outer quadrant of right female breast: Secondary | ICD-10-CM | POA: Diagnosis not present

## 2020-04-05 DIAGNOSIS — C50919 Malignant neoplasm of unspecified site of unspecified female breast: Secondary | ICD-10-CM | POA: Diagnosis not present

## 2020-04-05 DIAGNOSIS — I2510 Atherosclerotic heart disease of native coronary artery without angina pectoris: Secondary | ICD-10-CM | POA: Diagnosis not present

## 2020-04-05 DIAGNOSIS — I10 Essential (primary) hypertension: Secondary | ICD-10-CM | POA: Diagnosis not present

## 2020-04-05 DIAGNOSIS — Z7984 Long term (current) use of oral hypoglycemic drugs: Secondary | ICD-10-CM | POA: Diagnosis not present

## 2020-04-05 DIAGNOSIS — I252 Old myocardial infarction: Secondary | ICD-10-CM | POA: Diagnosis not present

## 2020-04-05 DIAGNOSIS — C50411 Malignant neoplasm of upper-outer quadrant of right female breast: Secondary | ICD-10-CM | POA: Diagnosis not present

## 2020-04-05 DIAGNOSIS — Z170 Estrogen receptor positive status [ER+]: Secondary | ICD-10-CM | POA: Diagnosis not present

## 2020-04-05 DIAGNOSIS — Z7982 Long term (current) use of aspirin: Secondary | ICD-10-CM | POA: Diagnosis not present

## 2020-04-05 DIAGNOSIS — S2001XA Contusion of right breast, initial encounter: Secondary | ICD-10-CM | POA: Diagnosis not present

## 2020-04-05 DIAGNOSIS — E119 Type 2 diabetes mellitus without complications: Secondary | ICD-10-CM | POA: Diagnosis not present

## 2020-04-05 DIAGNOSIS — Z923 Personal history of irradiation: Secondary | ICD-10-CM | POA: Diagnosis not present

## 2020-04-05 DIAGNOSIS — Z79811 Long term (current) use of aromatase inhibitors: Secondary | ICD-10-CM | POA: Diagnosis not present

## 2020-04-05 DIAGNOSIS — Z79899 Other long term (current) drug therapy: Secondary | ICD-10-CM | POA: Diagnosis not present

## 2020-04-05 DIAGNOSIS — E785 Hyperlipidemia, unspecified: Secondary | ICD-10-CM | POA: Diagnosis not present

## 2020-04-05 DIAGNOSIS — Z9011 Acquired absence of right breast and nipple: Secondary | ICD-10-CM | POA: Diagnosis not present

## 2020-04-05 DIAGNOSIS — Z955 Presence of coronary angioplasty implant and graft: Secondary | ICD-10-CM | POA: Diagnosis not present

## 2020-04-06 DIAGNOSIS — E119 Type 2 diabetes mellitus without complications: Secondary | ICD-10-CM | POA: Diagnosis not present

## 2020-04-06 DIAGNOSIS — Z170 Estrogen receptor positive status [ER+]: Secondary | ICD-10-CM | POA: Diagnosis not present

## 2020-04-06 DIAGNOSIS — E785 Hyperlipidemia, unspecified: Secondary | ICD-10-CM | POA: Diagnosis not present

## 2020-04-06 DIAGNOSIS — I10 Essential (primary) hypertension: Secondary | ICD-10-CM | POA: Diagnosis not present

## 2020-04-06 DIAGNOSIS — Z955 Presence of coronary angioplasty implant and graft: Secondary | ICD-10-CM | POA: Diagnosis not present

## 2020-04-06 DIAGNOSIS — I2510 Atherosclerotic heart disease of native coronary artery without angina pectoris: Secondary | ICD-10-CM | POA: Diagnosis not present

## 2020-04-06 DIAGNOSIS — I252 Old myocardial infarction: Secondary | ICD-10-CM | POA: Diagnosis not present

## 2020-04-06 DIAGNOSIS — C50411 Malignant neoplasm of upper-outer quadrant of right female breast: Secondary | ICD-10-CM | POA: Diagnosis not present

## 2020-04-07 DIAGNOSIS — Z955 Presence of coronary angioplasty implant and graft: Secondary | ICD-10-CM | POA: Diagnosis not present

## 2020-04-07 DIAGNOSIS — I2510 Atherosclerotic heart disease of native coronary artery without angina pectoris: Secondary | ICD-10-CM | POA: Diagnosis not present

## 2020-04-07 DIAGNOSIS — Z923 Personal history of irradiation: Secondary | ICD-10-CM | POA: Diagnosis not present

## 2020-04-07 DIAGNOSIS — E785 Hyperlipidemia, unspecified: Secondary | ICD-10-CM | POA: Diagnosis not present

## 2020-04-07 DIAGNOSIS — C50919 Malignant neoplasm of unspecified site of unspecified female breast: Secondary | ICD-10-CM | POA: Diagnosis not present

## 2020-04-07 DIAGNOSIS — Z7984 Long term (current) use of oral hypoglycemic drugs: Secondary | ICD-10-CM | POA: Diagnosis not present

## 2020-04-07 DIAGNOSIS — E119 Type 2 diabetes mellitus without complications: Secondary | ICD-10-CM | POA: Diagnosis not present

## 2020-04-07 DIAGNOSIS — Z7982 Long term (current) use of aspirin: Secondary | ICD-10-CM | POA: Diagnosis not present

## 2020-04-07 DIAGNOSIS — C50411 Malignant neoplasm of upper-outer quadrant of right female breast: Secondary | ICD-10-CM | POA: Diagnosis not present

## 2020-04-07 DIAGNOSIS — Z9011 Acquired absence of right breast and nipple: Secondary | ICD-10-CM | POA: Diagnosis not present

## 2020-04-07 DIAGNOSIS — Z79899 Other long term (current) drug therapy: Secondary | ICD-10-CM | POA: Diagnosis not present

## 2020-04-07 DIAGNOSIS — S2001XA Contusion of right breast, initial encounter: Secondary | ICD-10-CM | POA: Diagnosis not present

## 2020-04-07 DIAGNOSIS — Z170 Estrogen receptor positive status [ER+]: Secondary | ICD-10-CM | POA: Diagnosis not present

## 2020-04-07 DIAGNOSIS — I252 Old myocardial infarction: Secondary | ICD-10-CM | POA: Diagnosis not present

## 2020-04-07 DIAGNOSIS — I10 Essential (primary) hypertension: Secondary | ICD-10-CM | POA: Diagnosis not present

## 2020-04-07 DIAGNOSIS — Z79811 Long term (current) use of aromatase inhibitors: Secondary | ICD-10-CM | POA: Diagnosis not present

## 2020-04-12 DIAGNOSIS — Z7982 Long term (current) use of aspirin: Secondary | ICD-10-CM | POA: Diagnosis not present

## 2020-04-12 DIAGNOSIS — Z170 Estrogen receptor positive status [ER+]: Secondary | ICD-10-CM | POA: Diagnosis not present

## 2020-04-12 DIAGNOSIS — E785 Hyperlipidemia, unspecified: Secondary | ICD-10-CM | POA: Diagnosis not present

## 2020-04-12 DIAGNOSIS — Z7984 Long term (current) use of oral hypoglycemic drugs: Secondary | ICD-10-CM | POA: Diagnosis not present

## 2020-04-12 DIAGNOSIS — Z955 Presence of coronary angioplasty implant and graft: Secondary | ICD-10-CM | POA: Diagnosis not present

## 2020-04-12 DIAGNOSIS — Z79899 Other long term (current) drug therapy: Secondary | ICD-10-CM | POA: Diagnosis not present

## 2020-04-12 DIAGNOSIS — Z79811 Long term (current) use of aromatase inhibitors: Secondary | ICD-10-CM | POA: Diagnosis not present

## 2020-04-12 DIAGNOSIS — I2510 Atherosclerotic heart disease of native coronary artery without angina pectoris: Secondary | ICD-10-CM | POA: Diagnosis not present

## 2020-04-12 DIAGNOSIS — S2001XA Contusion of right breast, initial encounter: Secondary | ICD-10-CM | POA: Diagnosis not present

## 2020-04-12 DIAGNOSIS — Z9011 Acquired absence of right breast and nipple: Secondary | ICD-10-CM | POA: Diagnosis not present

## 2020-04-12 DIAGNOSIS — C50411 Malignant neoplasm of upper-outer quadrant of right female breast: Secondary | ICD-10-CM | POA: Diagnosis not present

## 2020-04-12 DIAGNOSIS — C50919 Malignant neoplasm of unspecified site of unspecified female breast: Secondary | ICD-10-CM | POA: Diagnosis not present

## 2020-04-12 DIAGNOSIS — I10 Essential (primary) hypertension: Secondary | ICD-10-CM | POA: Diagnosis not present

## 2020-04-12 DIAGNOSIS — I252 Old myocardial infarction: Secondary | ICD-10-CM | POA: Diagnosis not present

## 2020-04-12 DIAGNOSIS — Z923 Personal history of irradiation: Secondary | ICD-10-CM | POA: Diagnosis not present

## 2020-04-12 DIAGNOSIS — E119 Type 2 diabetes mellitus without complications: Secondary | ICD-10-CM | POA: Diagnosis not present

## 2020-04-13 DIAGNOSIS — I2510 Atherosclerotic heart disease of native coronary artery without angina pectoris: Secondary | ICD-10-CM | POA: Diagnosis not present

## 2020-04-13 DIAGNOSIS — Z955 Presence of coronary angioplasty implant and graft: Secondary | ICD-10-CM | POA: Diagnosis not present

## 2020-04-13 DIAGNOSIS — C50411 Malignant neoplasm of upper-outer quadrant of right female breast: Secondary | ICD-10-CM | POA: Diagnosis not present

## 2020-04-13 DIAGNOSIS — I10 Essential (primary) hypertension: Secondary | ICD-10-CM | POA: Diagnosis not present

## 2020-04-13 DIAGNOSIS — E119 Type 2 diabetes mellitus without complications: Secondary | ICD-10-CM | POA: Diagnosis not present

## 2020-04-13 DIAGNOSIS — I252 Old myocardial infarction: Secondary | ICD-10-CM | POA: Diagnosis not present

## 2020-04-13 DIAGNOSIS — Z170 Estrogen receptor positive status [ER+]: Secondary | ICD-10-CM | POA: Diagnosis not present

## 2020-04-13 DIAGNOSIS — E785 Hyperlipidemia, unspecified: Secondary | ICD-10-CM | POA: Diagnosis not present

## 2020-04-14 DIAGNOSIS — Z79899 Other long term (current) drug therapy: Secondary | ICD-10-CM | POA: Diagnosis not present

## 2020-04-14 DIAGNOSIS — E119 Type 2 diabetes mellitus without complications: Secondary | ICD-10-CM | POA: Diagnosis not present

## 2020-04-14 DIAGNOSIS — C50919 Malignant neoplasm of unspecified site of unspecified female breast: Secondary | ICD-10-CM | POA: Diagnosis not present

## 2020-04-14 DIAGNOSIS — I2510 Atherosclerotic heart disease of native coronary artery without angina pectoris: Secondary | ICD-10-CM | POA: Diagnosis not present

## 2020-04-14 DIAGNOSIS — Z79811 Long term (current) use of aromatase inhibitors: Secondary | ICD-10-CM | POA: Diagnosis not present

## 2020-04-14 DIAGNOSIS — I10 Essential (primary) hypertension: Secondary | ICD-10-CM | POA: Diagnosis not present

## 2020-04-14 DIAGNOSIS — Z923 Personal history of irradiation: Secondary | ICD-10-CM | POA: Diagnosis not present

## 2020-04-14 DIAGNOSIS — S2001XA Contusion of right breast, initial encounter: Secondary | ICD-10-CM | POA: Diagnosis not present

## 2020-04-14 DIAGNOSIS — Z170 Estrogen receptor positive status [ER+]: Secondary | ICD-10-CM | POA: Diagnosis not present

## 2020-04-14 DIAGNOSIS — I252 Old myocardial infarction: Secondary | ICD-10-CM | POA: Diagnosis not present

## 2020-04-14 DIAGNOSIS — Z7982 Long term (current) use of aspirin: Secondary | ICD-10-CM | POA: Diagnosis not present

## 2020-04-14 DIAGNOSIS — Z7984 Long term (current) use of oral hypoglycemic drugs: Secondary | ICD-10-CM | POA: Diagnosis not present

## 2020-04-14 DIAGNOSIS — Z955 Presence of coronary angioplasty implant and graft: Secondary | ICD-10-CM | POA: Diagnosis not present

## 2020-04-14 DIAGNOSIS — C50411 Malignant neoplasm of upper-outer quadrant of right female breast: Secondary | ICD-10-CM | POA: Diagnosis not present

## 2020-04-14 DIAGNOSIS — N6489 Other specified disorders of breast: Secondary | ICD-10-CM | POA: Diagnosis not present

## 2020-04-14 DIAGNOSIS — E785 Hyperlipidemia, unspecified: Secondary | ICD-10-CM | POA: Diagnosis not present

## 2020-04-14 DIAGNOSIS — Z9011 Acquired absence of right breast and nipple: Secondary | ICD-10-CM | POA: Diagnosis not present

## 2020-04-15 DIAGNOSIS — Z170 Estrogen receptor positive status [ER+]: Secondary | ICD-10-CM | POA: Diagnosis not present

## 2020-04-15 DIAGNOSIS — I252 Old myocardial infarction: Secondary | ICD-10-CM | POA: Diagnosis not present

## 2020-04-15 DIAGNOSIS — E119 Type 2 diabetes mellitus without complications: Secondary | ICD-10-CM | POA: Diagnosis not present

## 2020-04-15 DIAGNOSIS — I10 Essential (primary) hypertension: Secondary | ICD-10-CM | POA: Diagnosis not present

## 2020-04-15 DIAGNOSIS — Z955 Presence of coronary angioplasty implant and graft: Secondary | ICD-10-CM | POA: Diagnosis not present

## 2020-04-15 DIAGNOSIS — C50411 Malignant neoplasm of upper-outer quadrant of right female breast: Secondary | ICD-10-CM | POA: Diagnosis not present

## 2020-04-15 DIAGNOSIS — E785 Hyperlipidemia, unspecified: Secondary | ICD-10-CM | POA: Diagnosis not present

## 2020-04-15 DIAGNOSIS — I2510 Atherosclerotic heart disease of native coronary artery without angina pectoris: Secondary | ICD-10-CM | POA: Diagnosis not present

## 2020-04-16 DIAGNOSIS — Z170 Estrogen receptor positive status [ER+]: Secondary | ICD-10-CM | POA: Diagnosis not present

## 2020-04-16 DIAGNOSIS — I2510 Atherosclerotic heart disease of native coronary artery without angina pectoris: Secondary | ICD-10-CM | POA: Diagnosis not present

## 2020-04-16 DIAGNOSIS — I252 Old myocardial infarction: Secondary | ICD-10-CM | POA: Diagnosis not present

## 2020-04-16 DIAGNOSIS — E785 Hyperlipidemia, unspecified: Secondary | ICD-10-CM | POA: Diagnosis not present

## 2020-04-16 DIAGNOSIS — E119 Type 2 diabetes mellitus without complications: Secondary | ICD-10-CM | POA: Diagnosis not present

## 2020-04-16 DIAGNOSIS — C50411 Malignant neoplasm of upper-outer quadrant of right female breast: Secondary | ICD-10-CM | POA: Diagnosis not present

## 2020-04-16 DIAGNOSIS — I10 Essential (primary) hypertension: Secondary | ICD-10-CM | POA: Diagnosis not present

## 2020-04-16 DIAGNOSIS — Z955 Presence of coronary angioplasty implant and graft: Secondary | ICD-10-CM | POA: Diagnosis not present

## 2020-04-19 DIAGNOSIS — E119 Type 2 diabetes mellitus without complications: Secondary | ICD-10-CM | POA: Diagnosis not present

## 2020-04-19 DIAGNOSIS — I2510 Atherosclerotic heart disease of native coronary artery without angina pectoris: Secondary | ICD-10-CM | POA: Diagnosis not present

## 2020-04-19 DIAGNOSIS — I10 Essential (primary) hypertension: Secondary | ICD-10-CM | POA: Diagnosis not present

## 2020-04-19 DIAGNOSIS — Z170 Estrogen receptor positive status [ER+]: Secondary | ICD-10-CM | POA: Diagnosis not present

## 2020-04-19 DIAGNOSIS — Z955 Presence of coronary angioplasty implant and graft: Secondary | ICD-10-CM | POA: Diagnosis not present

## 2020-04-19 DIAGNOSIS — E785 Hyperlipidemia, unspecified: Secondary | ICD-10-CM | POA: Diagnosis not present

## 2020-04-19 DIAGNOSIS — I252 Old myocardial infarction: Secondary | ICD-10-CM | POA: Diagnosis not present

## 2020-04-19 DIAGNOSIS — C50411 Malignant neoplasm of upper-outer quadrant of right female breast: Secondary | ICD-10-CM | POA: Diagnosis not present

## 2020-04-20 DIAGNOSIS — C50411 Malignant neoplasm of upper-outer quadrant of right female breast: Secondary | ICD-10-CM | POA: Diagnosis not present

## 2020-04-20 DIAGNOSIS — I10 Essential (primary) hypertension: Secondary | ICD-10-CM | POA: Diagnosis not present

## 2020-04-20 DIAGNOSIS — Z955 Presence of coronary angioplasty implant and graft: Secondary | ICD-10-CM | POA: Diagnosis not present

## 2020-04-20 DIAGNOSIS — Z170 Estrogen receptor positive status [ER+]: Secondary | ICD-10-CM | POA: Diagnosis not present

## 2020-04-20 DIAGNOSIS — I252 Old myocardial infarction: Secondary | ICD-10-CM | POA: Diagnosis not present

## 2020-04-20 DIAGNOSIS — E119 Type 2 diabetes mellitus without complications: Secondary | ICD-10-CM | POA: Diagnosis not present

## 2020-04-20 DIAGNOSIS — I2510 Atherosclerotic heart disease of native coronary artery without angina pectoris: Secondary | ICD-10-CM | POA: Diagnosis not present

## 2020-04-20 DIAGNOSIS — E785 Hyperlipidemia, unspecified: Secondary | ICD-10-CM | POA: Diagnosis not present

## 2020-04-21 DIAGNOSIS — C50411 Malignant neoplasm of upper-outer quadrant of right female breast: Secondary | ICD-10-CM | POA: Diagnosis not present

## 2020-04-21 DIAGNOSIS — I252 Old myocardial infarction: Secondary | ICD-10-CM | POA: Diagnosis not present

## 2020-04-21 DIAGNOSIS — E119 Type 2 diabetes mellitus without complications: Secondary | ICD-10-CM | POA: Diagnosis not present

## 2020-04-21 DIAGNOSIS — I10 Essential (primary) hypertension: Secondary | ICD-10-CM | POA: Diagnosis not present

## 2020-04-21 DIAGNOSIS — I2510 Atherosclerotic heart disease of native coronary artery without angina pectoris: Secondary | ICD-10-CM | POA: Diagnosis not present

## 2020-04-21 DIAGNOSIS — Z170 Estrogen receptor positive status [ER+]: Secondary | ICD-10-CM | POA: Diagnosis not present

## 2020-04-21 DIAGNOSIS — E785 Hyperlipidemia, unspecified: Secondary | ICD-10-CM | POA: Diagnosis not present

## 2020-04-21 DIAGNOSIS — Z955 Presence of coronary angioplasty implant and graft: Secondary | ICD-10-CM | POA: Diagnosis not present

## 2020-04-22 DIAGNOSIS — E119 Type 2 diabetes mellitus without complications: Secondary | ICD-10-CM | POA: Diagnosis not present

## 2020-04-22 DIAGNOSIS — Z170 Estrogen receptor positive status [ER+]: Secondary | ICD-10-CM | POA: Diagnosis not present

## 2020-04-22 DIAGNOSIS — Z955 Presence of coronary angioplasty implant and graft: Secondary | ICD-10-CM | POA: Diagnosis not present

## 2020-04-22 DIAGNOSIS — C50411 Malignant neoplasm of upper-outer quadrant of right female breast: Secondary | ICD-10-CM | POA: Diagnosis not present

## 2020-04-22 DIAGNOSIS — I10 Essential (primary) hypertension: Secondary | ICD-10-CM | POA: Diagnosis not present

## 2020-04-22 DIAGNOSIS — I252 Old myocardial infarction: Secondary | ICD-10-CM | POA: Diagnosis not present

## 2020-04-22 DIAGNOSIS — I2510 Atherosclerotic heart disease of native coronary artery without angina pectoris: Secondary | ICD-10-CM | POA: Diagnosis not present

## 2020-04-22 DIAGNOSIS — E785 Hyperlipidemia, unspecified: Secondary | ICD-10-CM | POA: Diagnosis not present

## 2020-04-23 DIAGNOSIS — I10 Essential (primary) hypertension: Secondary | ICD-10-CM | POA: Diagnosis not present

## 2020-04-23 DIAGNOSIS — E785 Hyperlipidemia, unspecified: Secondary | ICD-10-CM | POA: Diagnosis not present

## 2020-04-23 DIAGNOSIS — Z955 Presence of coronary angioplasty implant and graft: Secondary | ICD-10-CM | POA: Diagnosis not present

## 2020-04-23 DIAGNOSIS — Z170 Estrogen receptor positive status [ER+]: Secondary | ICD-10-CM | POA: Diagnosis not present

## 2020-04-23 DIAGNOSIS — I2510 Atherosclerotic heart disease of native coronary artery without angina pectoris: Secondary | ICD-10-CM | POA: Diagnosis not present

## 2020-04-23 DIAGNOSIS — C50411 Malignant neoplasm of upper-outer quadrant of right female breast: Secondary | ICD-10-CM | POA: Diagnosis not present

## 2020-04-23 DIAGNOSIS — E119 Type 2 diabetes mellitus without complications: Secondary | ICD-10-CM | POA: Diagnosis not present

## 2020-04-23 DIAGNOSIS — I252 Old myocardial infarction: Secondary | ICD-10-CM | POA: Diagnosis not present

## 2020-04-28 DIAGNOSIS — E785 Hyperlipidemia, unspecified: Secondary | ICD-10-CM | POA: Diagnosis not present

## 2020-04-28 DIAGNOSIS — E119 Type 2 diabetes mellitus without complications: Secondary | ICD-10-CM | POA: Diagnosis not present

## 2020-04-28 DIAGNOSIS — I10 Essential (primary) hypertension: Secondary | ICD-10-CM | POA: Diagnosis not present

## 2020-06-22 DIAGNOSIS — Z923 Personal history of irradiation: Secondary | ICD-10-CM | POA: Insufficient documentation

## 2020-06-25 ENCOUNTER — Ambulatory Visit: Payer: Medicare HMO | Admitting: Podiatry

## 2020-06-25 ENCOUNTER — Other Ambulatory Visit: Payer: Self-pay

## 2020-06-25 ENCOUNTER — Encounter: Payer: Self-pay | Admitting: Podiatry

## 2020-06-25 DIAGNOSIS — E1165 Type 2 diabetes mellitus with hyperglycemia: Secondary | ICD-10-CM

## 2020-06-25 DIAGNOSIS — B351 Tinea unguium: Secondary | ICD-10-CM

## 2020-06-25 DIAGNOSIS — M79675 Pain in left toe(s): Secondary | ICD-10-CM

## 2020-06-25 DIAGNOSIS — M79674 Pain in right toe(s): Secondary | ICD-10-CM | POA: Diagnosis not present

## 2020-06-25 DIAGNOSIS — L608 Other nail disorders: Secondary | ICD-10-CM

## 2020-06-25 NOTE — Progress Notes (Signed)
This patient presents  to my office for at risk foot care.  This patient requires this care by a professional since this patient will be at risk due to having  Diabetes.  This patient is unable to cut nails herself since the patient cannot reach hiernails.These nails are painful walking and wearing shoes.  This patient presents for at risk foot care today.  General Appearance  Alert, conversant and in no acute stress.  Vascular  Dorsalis pedis  pulses are weakly  palpable  bilaterally. Posterior tibial pulses are absent  B/L. Capillary return is within normal limits  bilaterally. Temperature is within normal limits  bilaterally.  Neurologic  Senn-Weinstein monofilament wire test within normal limits  bilaterally. Muscle power within normal limits bilaterally.  Nails Thick disfigured discolored nails with subungual debris  from hallux to fifth toes bilaterally. No evidence of bacterial infection or drainage bilaterally.  Pincer nails noted.  Orthopedic  No limitations of motion  feet .  No crepitus or effusions noted.  No bony pathology or digital deformities noted.  Mild  HAV  B/L.  Skin  normotropic skin with no porokeratosis noted bilaterally.  No signs of infections or ulcers noted.     Onychomycosis  Pain in right toes  Pain in left toes  Consent was obtained for treatment procedures.   Mechanical debridement of nails 1-5  bilaterally performed with a nail nipper.  Filed with dremel without incident.    Return office visit    3 months                  Told patient to return for periodic foot care and evaluation due to potential at risk complications.   Gardiner Barefoot DPM

## 2020-07-09 ENCOUNTER — Encounter: Payer: Self-pay | Admitting: *Deleted

## 2020-07-09 ENCOUNTER — Other Ambulatory Visit: Payer: Self-pay

## 2020-07-09 ENCOUNTER — Encounter: Payer: Self-pay | Admitting: Cardiology

## 2020-07-09 ENCOUNTER — Ambulatory Visit (INDEPENDENT_AMBULATORY_CARE_PROVIDER_SITE_OTHER): Payer: Medicare HMO | Admitting: Cardiology

## 2020-07-09 VITALS — BP 140/80 | HR 73 | Ht 66.0 in | Wt 302.0 lb

## 2020-07-09 DIAGNOSIS — I10 Essential (primary) hypertension: Secondary | ICD-10-CM

## 2020-07-09 DIAGNOSIS — E782 Mixed hyperlipidemia: Secondary | ICD-10-CM

## 2020-07-09 DIAGNOSIS — I2510 Atherosclerotic heart disease of native coronary artery without angina pectoris: Secondary | ICD-10-CM | POA: Diagnosis not present

## 2020-07-09 MED ORDER — AMLODIPINE BESYLATE 10 MG PO TABS
10.0000 mg | ORAL_TABLET | Freq: Every day | ORAL | 1 refills | Status: DC
Start: 2020-07-09 — End: 2021-01-20

## 2020-07-09 NOTE — Progress Notes (Signed)
Clinical Summary Sandra Schneider is a 65 y.o.female seen today for follow up of the following medical problems  1. CAD - history of NSTEMI 06/2012, received DES to prox LAD and DES to mid LCX - 06/2012 echo LVEF 55-60%, grade I DDx  - no recent chest pain. No SOB or doe - compliatn with meds    2. HTN - home bp's 130s-140s/60-70s - compliant with meds   3. Hyperlipidemia - labs followed by pcp - compliant with statin   4. Breast cancer - recent lumpectomy  5. Varicose veins - followed by vascular  SH: son is Randa Ngo, patient here Has had covid vaccine x 3   Past Medical History:  Diagnosis Date  . Coronary artery disease   . Diabetes mellitus   . GERD (gastroesophageal reflux disease)   . Hypertension   . Myocardial abscess   . Myocardial infarct ([place]) 06/24/2012     No Known Allergies   Current Outpatient Medications  Medication Sig Dispense Refill  . amLODipine (NORVASC) 5 MG tablet Take 1 tablet (5 mg total) by mouth daily. 90 tablet 3  . Ammonium Lactate 10 % LOTN Apply daily to dry skin on feet 355 mL 2  . aspirin EC 81 MG tablet Take 81 mg by mouth daily.    Marland Kitchen atorvastatin (LIPITOR) 40 MG tablet Take 1 tablet by mouth once daily 90 tablet 1  . brimonidine (ALPHAGAN) 0.2 % ophthalmic solution Place 1 drop into the right eye 2 (two) times daily.    . cetirizine (ZYRTEC) 10 MG tablet Take 10 mg by mouth daily.    . chlorthalidone (HYGROTON) 25 MG tablet Take 1 tablet by mouth once daily 90 tablet 1  . dorzolamide-timolol (COSOPT) 22.3-6.8 MG/ML ophthalmic solution Place 1 drop into both eyes 2 (two) times daily.    . famotidine (PEPCID) 40 MG tablet Take 1 tablet (40 mg total) by mouth daily. 20 tablet 0  . glipiZIDE (GLUCOTROL) 10 MG tablet Take 10 mg by mouth 2 (two) times daily before a meal.    . letrozole (FEMARA) 2.5 MG tablet Take 2.5 mg by mouth daily.    Marland Kitchen lisinopril (ZESTRIL) 40 MG tablet Take 1 tablet by mouth once daily 90  tablet 1  . metFORMIN (GLUCOPHAGE) 1000 MG tablet Take 1,000 mg by mouth 2 (two) times daily.    . metoprolol tartrate (LOPRESSOR) 25 MG tablet Take 25 mg by mouth 2 (two) times daily.     . predniSONE (STERAPRED UNI-PAK 48 TAB) 5 MG (48) TBPK tablet Take by mouth as directed.    . Prenatal Vit-Fe Fumarate-FA (PRENATAL MULTIVITAMIN) TABS Take 1 tablet by mouth daily.    . traMADol (ULTRAM) 50 MG tablet Take by mouth every 6 (six) hours as needed.     No current facility-administered medications for this visit.     Past Surgical History:  Procedure Laterality Date  . ANTERIOR VITRECTOMY Left 07/21/2019   Procedure: ANTERIOR VITRECTOMY;  Surgeon: Baruch Goldmann, MD;  Location: [facility];  Service: Ophthalmology;  Laterality: Left;  . ANTERIOR VITRECTOMY Right 08/04/2019   Procedure: ANTERIOR VITRECTOMY;  Surgeon: Baruch Goldmann, MD;  Location: [facility];  Service: Ophthalmology;  Laterality: Right;  . CARDIAC CATHETERIZATION    . CATARACT EXTRACTION W/PHACO Left 07/21/2019   Procedure: CATARACT EXTRACTION PHACO AND INTRAOCULAR LENS PLACEMENT (IOC) (CDE: 11.45);  Surgeon: Baruch Goldmann, MD;  Location: [facility];  Service: Ophthalmology;  Laterality: Left;  . CATARACT EXTRACTION W/PHACO Right 08/04/2019  Procedure: CATARACT EXTRACTION PHACO AND INTRAOCULAR LENS PLACEMENT (IOC);  Surgeon: Baruch Goldmann, MD;  Location: [facility];  Service: Ophthalmology;  Laterality: Right;  CDE: 9.78  . CESAREAN SECTION    . CORONARY STENT PLACEMENT N/A 06/24/2012  . LEFT HEART CATHETERIZATION WITH CORONARY ANGIOGRAM N/A 06/24/2012   Procedure: LEFT HEART CATHETERIZATION WITH CORONARY ANGIOGRAM;  Surgeon: Jolaine Artist, MD;  Location: [facility] CATH LAB;  Service: Cardiovascular;  Laterality: N/A;     No Known Allergies    Family History  Problem Relation Age of Onset  . Heart disease Father   . Diabetes Other   . Hypertension Other   . Cancer Other      Social History Ms. Vine reports that she has never  smoked. She has never used smokeless tobacco. Ms. Friedly reports no history of alcohol use.   Review of Systems CONSTITUTIONAL: No weight loss, fever, chills, weakness or fatigue.  HEENT: Eyes: No visual loss, blurred vision, double vision or yellow sclerae.No hearing loss, sneezing, congestion, runny nose or sore throat.  SKIN: No rash or itching.  CARDIOVASCULAR: per hpi RESPIRATORY: No shortness of breath, cough or sputum.  GASTROINTESTINAL: No anorexia, nausea, vomiting or diarrhea. No abdominal pain or blood.  GENITOURINARY: No burning on urination, no polyuria NEUROLOGICAL: No headache, dizziness, syncope, paralysis, ataxia, numbness or tingling in the extremities. No change in bowel or bladder control.  MUSCULOSKELETAL: No muscle, back pain, joint pain or stiffness.  LYMPHATICS: No enlarged nodes. No history of splenectomy.  PSYCHIATRIC: No history of depression or anxiety.  ENDOCRINOLOGIC: No reports of sweating, cold or heat intolerance. No polyuria or polydipsia.  Marland Kitchen   Physical Examination Today's Vitals   07/09/20 0922  BP: 140/80  Pulse: 73  SpO2: 96%  Weight: (!) 302 lb (137 kg)  Height: 5\' 6"  (1.676 m)   Body mass index is 48.74 kg/m.  Gen: resting comfortably, no acute distress HEENT: no scleral icterus, pupils equal round and reactive, no palptable cervical adenopathy,  CV: RRR, no m/r/g, no jvd Resp: Clear to auscultation bilaterally GI: abdomen is soft, non-tender, non-distended, normal bowel sounds, no hepatosplenomegaly MSK: extremities are warm, no edema.  Skin: warm, no rash Neuro:  no focal deficits Psych: appropriate affect   Diagnostic Studies 06/2012 echo Study Conclusions   - Left ventricle: The cavity size was normal. Wall thickness  was normal. Systolic function was normal. The estimated  ejection fraction was in the range of 55% to 60%. Wall  motion was normal; there were no regional wall motion  abnormalities. Doppler  parameters are consistent with  abnormal left ventricular relaxation (grade 1 diastolic  dysfunction). Doppler parameters are consistent with high  ventricular filling pressure.  - Mitral valve: Calcified annulus.  - Left atrium: The atrium was mildly dilated.  - Atrial septum: There was increased thickness of the  septum, consistent with lipomatous hypertrophy.   06/2012 cath   Findings:  Ao Pressure: 125/59 (83) LV Pressure: 137/11/20 There was no signficant gradient across the aortic valve on pullback.  Left main: Short.Normal  LAD: Large vessel with proximal 80% lesion. Mid vessel 30%. Distal vessel has diffuse moderate disease.   LCX: Large vessel gives off 3 OMs. 99% midsection followed by 40%. Distal vessel is small with 70% lesion.   RCA: Dominant. Normal.   LV-gram done in the RAO projection: Ejection fraction = 60%. No regional wall motion abnormalities.   Assessment: 1. 2V CAD 2. Normal LV function 3. NSTEMI  Plan/Discussion:  Plan  PCI of LCX & LAD.   PCI Data: Vessel - proximal and mid left circumflex/Segment - proximal and mid Percent Stenosis (pre) 95% TIMI-flow 3 Stent 3.0 x 32 mm Promus drug-eluting stent postdilated with a 3.5 noncompliant balloon. Percent Stenosis (post) 0% TIMI-flow (post) 3   Vessel - LAD/Segment - proximal  Percent Stenosis (pre) 85% TIMI-flow 3 Stent 3.0 x 20 mm Promus drug-eluting stent postdilated with a 3.5 noncompliant balloon. Percent Stenosis (post) 0% TIMI-flow (post) 3  Final Conclusions:  Successful angioplasty and drug-eluting stent placement to the proximal/mid left circumflex as well as proximal left anterior descending artery.  Recommendations:  Recommend dual antiplatelet therapy for at least one year. Aggressive treatment of risk factors is recommended.    Assessment and Plan  1. CAD - denies symptoms, continue current meds  2. HTN - bp above goal, increase  norvasc to 10mg  daily.   3. Hyperlipidemia - request pcp labs, continue statin       Arnoldo Lenis, M.D.

## 2020-07-09 NOTE — Patient Instructions (Signed)
Your physician recommends that you schedule a follow-up appointment in: [facility] has recommended you make the following change in your medication:   INCREASE AMLODIPINE 10 MG DAILY   Thank you for choosing [facility]!!

## 2020-09-07 DIAGNOSIS — D120 Benign neoplasm of cecum: Secondary | ICD-10-CM | POA: Insufficient documentation

## 2020-09-24 ENCOUNTER — Other Ambulatory Visit: Payer: Self-pay

## 2020-09-24 ENCOUNTER — Ambulatory Visit: Payer: Medicare HMO | Admitting: Podiatry

## 2020-09-24 ENCOUNTER — Encounter: Payer: Self-pay | Admitting: Podiatry

## 2020-09-24 DIAGNOSIS — E1165 Type 2 diabetes mellitus with hyperglycemia: Secondary | ICD-10-CM | POA: Diagnosis not present

## 2020-09-24 DIAGNOSIS — M79674 Pain in right toe(s): Secondary | ICD-10-CM

## 2020-09-24 DIAGNOSIS — L608 Other nail disorders: Secondary | ICD-10-CM

## 2020-09-24 DIAGNOSIS — M79675 Pain in left toe(s): Secondary | ICD-10-CM | POA: Diagnosis not present

## 2020-09-24 DIAGNOSIS — B351 Tinea unguium: Secondary | ICD-10-CM

## 2020-09-24 NOTE — Progress Notes (Signed)
This patient presents  to my office for at risk foot care.  This patient requires this care by a professional since this patient will be at risk due to having  Diabetes.  This patient is unable to cut nails herself since the patient cannot reach hiernails.These nails are painful walking and wearing shoes.  This patient presents for at risk foot care today.  General Appearance  Alert, conversant and in no acute stress.  Vascular  Dorsalis pedis  pulses are weakly  palpable  bilaterally. Posterior tibial pulses are absent  B/L. Capillary return is within normal limits  bilaterally. Temperature is within normal limits  bilaterally.  Neurologic  Senn-Weinstein monofilament wire test within normal limits  bilaterally. Muscle power within normal limits bilaterally.  Nails Thick disfigured discolored nails with subungual debris  from hallux to fifth toes bilaterally. No evidence of bacterial infection or drainage bilaterally.  Pincer nails noted.  Orthopedic  No limitations of motion  feet .  No crepitus or effusions noted.  No bony pathology or digital deformities noted.  Mild  HAV  B/L.  Skin  normotropic skin with no porokeratosis noted bilaterally.  No signs of infections or ulcers noted.     Onychomycosis  Pain in right toes  Pain in left toes  Consent was obtained for treatment procedures.   Mechanical debridement of nails 1-5  bilaterally performed with a nail nipper.  Filed with dremel without incident.    Return office visit    3 months                  Told patient to return for periodic foot care and evaluation due to potential at risk complications.   Jese Comella DPM  

## 2020-09-27 ENCOUNTER — Other Ambulatory Visit: Payer: Self-pay | Admitting: Cardiology

## 2020-10-12 ENCOUNTER — Other Ambulatory Visit: Payer: Self-pay | Admitting: Cardiology

## 2020-10-29 DIAGNOSIS — L509 Urticaria, unspecified: Secondary | ICD-10-CM | POA: Insufficient documentation

## 2020-12-29 ENCOUNTER — Ambulatory Visit: Payer: Medicare HMO | Admitting: Podiatry

## 2020-12-29 ENCOUNTER — Encounter: Payer: Self-pay | Admitting: Podiatry

## 2020-12-29 ENCOUNTER — Other Ambulatory Visit: Payer: Self-pay

## 2020-12-29 DIAGNOSIS — M79674 Pain in right toe(s): Secondary | ICD-10-CM

## 2020-12-29 DIAGNOSIS — M79675 Pain in left toe(s): Secondary | ICD-10-CM

## 2020-12-29 DIAGNOSIS — L608 Other nail disorders: Secondary | ICD-10-CM

## 2020-12-29 DIAGNOSIS — B351 Tinea unguium: Secondary | ICD-10-CM

## 2020-12-29 DIAGNOSIS — E1165 Type 2 diabetes mellitus with hyperglycemia: Secondary | ICD-10-CM

## 2020-12-29 NOTE — Progress Notes (Signed)
This patient presents  to my office for at risk foot care.  This patient requires this care by a professional since this patient will be at risk due to having  Diabetes.  This patient is unable to cut nails herself since the patient cannot reach hiernails.These nails are painful walking and wearing shoes.  This patient presents for at risk foot care today.  General Appearance  Alert, conversant and in no acute stress.  Vascular  Dorsalis pedis  pulses are weakly  palpable  bilaterally. Posterior tibial pulses are absent  B/L. Capillary return is within normal limits  bilaterally. Temperature is within normal limits  bilaterally.  Neurologic  Senn-Weinstein monofilament wire test within normal limits  bilaterally. Muscle power within normal limits bilaterally.  Nails Thick disfigured discolored nails with subungual debris  from hallux to fifth toes bilaterally. No evidence of bacterial infection or drainage bilaterally.  Pincer nails noted.  Orthopedic  No limitations of motion  feet .  No crepitus or effusions noted.  No bony pathology or digital deformities noted.  Mild  HAV  B/L.  Skin  normotropic skin with no porokeratosis noted bilaterally.  No signs of infections or ulcers noted.     Onychomycosis  Pain in right toes  Pain in left toes  Consent was obtained for treatment procedures.   Mechanical debridement of nails 1-5  bilaterally performed with a nail nipper.  Filed with dremel without incident.    Return office visit    10 weeks                  Told patient to return for periodic foot care and evaluation due to potential at risk complications.   Gardiner Barefoot DPM

## 2021-01-20 ENCOUNTER — Telehealth: Payer: Self-pay | Admitting: Cardiology

## 2021-01-20 NOTE — Telephone Encounter (Addendum)
Informed patient that '10mg'$  daily is maximum dose on the Amlodipine.  Patient already has her 6 mo visit scheduled with Dr. Harl Bowie for 01/26/2021 [facility] office.  Informed her that she can discuss this at her next visit in further detail at that time.  She verbalized understanding.

## 2021-01-20 NOTE — Telephone Encounter (Signed)
Pt called stating that Dr. Marlon Pel at [facility] gave her another '5mg'$  Rx of Amlodipine to be on, making her dose '15mg'$ .   She'd like to know if she can get a new Rx for '15mg'$ ?   Please call [phone]

## 2021-01-26 ENCOUNTER — Other Ambulatory Visit: Payer: Self-pay

## 2021-01-26 ENCOUNTER — Ambulatory Visit: Payer: Medicare HMO | Admitting: Cardiology

## 2021-01-26 ENCOUNTER — Encounter: Payer: Self-pay | Admitting: *Deleted

## 2021-01-26 ENCOUNTER — Encounter: Payer: Self-pay | Admitting: Cardiology

## 2021-01-26 VITALS — BP 110/56 | HR 60 | Ht 66.0 in | Wt 294.0 lb

## 2021-01-26 DIAGNOSIS — I10 Essential (primary) hypertension: Secondary | ICD-10-CM

## 2021-01-26 DIAGNOSIS — E782 Mixed hyperlipidemia: Secondary | ICD-10-CM | POA: Diagnosis not present

## 2021-01-26 DIAGNOSIS — I2510 Atherosclerotic heart disease of native coronary artery without angina pectoris: Secondary | ICD-10-CM

## 2021-01-26 NOTE — Patient Instructions (Addendum)
Medication Instructions:  Decrease Norvasc to '10mg'$  daily  Continue all other medications.     Labwork: none  Testing/Procedures: none  Follow-Up: 6 months   Any Other Special Instructions Will Be Listed Below (If Applicable). Please call the office in 1 week with update on BP readings.    If you need a refill on your cardiac medications before your next appointment, please call your pharmacy.

## 2021-01-26 NOTE — Progress Notes (Signed)
Clinical Summary Sandra Schneider is a 65 y.o.femaleseen today for follow up of the following medical problems   1. CAD - history of NSTEMI 06/2012, received DES to prox LAD and DES to mid LCX - 06/2012 echo LVEF 55-60%, grade I DDx  - no recent chest pain - compliant with meds       2. HTN - home bp's 130s-140s/60-70s - compliant with meds  - she is taking '15mg'$  of norvasc after recent pcp visit      3. Hyperlipidemia - labs followed by pcp - she is on atorvastatin '40mg'$  daily      4. Breast cancer - recent lumpectomy   5. Varicose veins - followed by vascular   SH: son is Sandra Schneider, patient here Has had covid vaccine x 3 Works part time security  Past Medical History:  Diagnosis Date   Coronary artery disease    Diabetes mellitus    GERD (gastroesophageal reflux disease)    Hypertension    Myocardial abscess    Myocardial infarct ([place]) 06/24/2012     No Known Allergies   Current Outpatient Medications  Medication Sig Dispense Refill   amLODipine (NORVASC) 10 MG tablet Take 1 tablet by mouth once daily 90 tablet 0   aspirin EC 81 MG tablet Take 81 mg by mouth daily.     atorvastatin (LIPITOR) 40 MG tablet Take 1 tablet by mouth once daily 90 tablet 1   cetirizine (ZYRTEC) 10 MG tablet Take 10 mg by mouth daily.     chlorthalidone (HYGROTON) 25 MG tablet Take 1 tablet by mouth once daily 90 tablet 3   dorzolamide-timolol (COSOPT) 22.3-6.8 MG/ML ophthalmic solution Place 1 drop into both eyes 2 (two) times daily.     famotidine (PEPCID) 40 MG tablet Take 1 tablet (40 mg total) by mouth daily. (Patient taking differently: Take 20 mg by mouth daily.) 20 tablet 0   glipiZIDE (GLUCOTROL) 10 MG tablet Take 10 mg by mouth 2 (two) times daily before a meal.     letrozole (FEMARA) 2.5 MG tablet Take 2.5 mg by mouth daily.     lisinopril (ZESTRIL) 40 MG tablet Take 1 tablet by mouth once daily 90 tablet 3   metFORMIN (GLUCOPHAGE) 1000 MG tablet Take 1,000 mg by  mouth 2 (two) times daily.     metoprolol tartrate (LOPRESSOR) 25 MG tablet Take 25 mg by mouth 2 (two) times daily.      No current facility-administered medications for this visit.     Past Surgical History:  Procedure Laterality Date   ANTERIOR VITRECTOMY Left 07/21/2019   Procedure: ANTERIOR VITRECTOMY;  Surgeon: Baruch Goldmann, MD;  Location: [facility];  Service: Ophthalmology;  Laterality: Left;   ANTERIOR VITRECTOMY Right 08/04/2019   Procedure: ANTERIOR VITRECTOMY;  Surgeon: Baruch Goldmann, MD;  Location: [facility];  Service: Ophthalmology;  Laterality: Right;   CARDIAC CATHETERIZATION     CATARACT EXTRACTION W/PHACO Left 07/21/2019   Procedure: CATARACT EXTRACTION PHACO AND INTRAOCULAR LENS PLACEMENT (IOC) (CDE: 11.45);  Surgeon: Baruch Goldmann, MD;  Location: [facility];  Service: Ophthalmology;  Laterality: Left;   CATARACT EXTRACTION W/PHACO Right 08/04/2019   Procedure: CATARACT EXTRACTION PHACO AND INTRAOCULAR LENS PLACEMENT (IOC);  Surgeon: Baruch Goldmann, MD;  Location: [facility];  Service: Ophthalmology;  Laterality: Right;  CDE: 9.78   CESAREAN SECTION     CORONARY STENT PLACEMENT N/A 06/24/2012   LEFT HEART CATHETERIZATION WITH CORONARY ANGIOGRAM N/A 06/24/2012   Procedure: LEFT HEART CATHETERIZATION WITH  CORONARY ANGIOGRAM;  Surgeon: Jolaine Artist, MD;  Location: [facility] CATH LAB;  Service: Cardiovascular;  Laterality: N/A;     No Known Allergies    Family History  Problem Relation Age of Onset   Heart disease Father    Diabetes Other    Hypertension Other    Cancer Other      Social History Ms. Watters reports that she has never smoked. She has never used smokeless tobacco. Ms. Sammut reports no history of alcohol use.   Review of Systems CONSTITUTIONAL: No weight loss, fever, chills, weakness or fatigue.  HEENT: Eyes: No visual loss, blurred vision, double vision or yellow sclerae.No hearing loss, sneezing, congestion, runny nose or sore throat.  SKIN: No rash or  itching.  CARDIOVASCULAR: per hpi RESPIRATORY: No shortness of breath, cough or sputum.  GASTROINTESTINAL: No anorexia, nausea, vomiting or diarrhea. No abdominal pain or blood.  GENITOURINARY: No burning on urination, no polyuria NEUROLOGICAL: No headache, dizziness, syncope, paralysis, ataxia, numbness or tingling in the extremities. No change in bowel or bladder control.  MUSCULOSKELETAL: No muscle, back pain, joint pain or stiffness.  LYMPHATICS: No enlarged nodes. No history of splenectomy.  PSYCHIATRIC: No history of depression or anxiety.  ENDOCRINOLOGIC: No reports of sweating, cold or heat intolerance. No polyuria or polydipsia.  Marland Kitchen   Physical Examination Today's Vitals   01/26/21 0813  BP: (!) 110/56  Pulse: 60  SpO2: 91%  Weight: 294 lb (133.4 kg)  Height: '5\' 6"'$  (1.676 m)   Body mass index is 47.45 kg/m.  Gen: resting comfortably, no acute distress HEENT: no scleral icterus, pupils equal round and reactive, no palptable cervical adenopathy,  CV: RRR, no m/r/g no jvd Resp: Clear to auscultation bilaterally GI: abdomen is soft, non-tender, non-distended, normal bowel sounds, no hepatosplenomegaly MSK: extremities are warm, no edema.  Skin: warm, no rash Neuro:  no focal deficits Psych: appropriate affect   Diagnostic Studies  06/2012 echo Study Conclusions   - Left ventricle: The cavity size was normal. Wall thickness    was normal. Systolic function was normal. The estimated    ejection fraction was in the range of 55% to 60%. Wall    motion was normal; there were no regional wall motion    abnormalities. Doppler parameters are consistent with    abnormal left ventricular relaxation (grade 1 diastolic    dysfunction). Doppler parameters are consistent with high    ventricular filling pressure.  - Mitral valve: Calcified annulus.  - Left atrium: The atrium was mildly dilated.  - Atrial septum: There was increased thickness of the    septum, consistent with  lipomatous hypertrophy.      06/2012 cath     Findings:   Ao Pressure: 125/59 (83) LV Pressure:  137/11/20 There was no signficant gradient across the aortic valve on pullback.   Left main: Short. Normal   LAD: Large vessel with proximal 80% lesion. Mid vessel 30%. Distal vessel has diffuse moderate disease.    LCX: Large vessel gives off 3 OMs. 99% midsection followed by 40%. Distal vessel is small with 70% lesion.    RCA: Dominant. Normal.    LV-gram done in the RAO projection: Ejection fraction = 60%. No regional wall motion abnormalities.    Assessment: 1. 2V CAD 2. Normal LV function 3. NSTEMI   Plan/Discussion:   Plan PCI of LCX & LAD.     PCI Data: Vessel - proximal and mid left circumflex/Segment - proximal and mid Percent Stenosis (  pre)  95% TIMI-flow 3 Stent 3.0 x 32 mm Promus drug-eluting stent postdilated with a 3.5 noncompliant balloon. Percent Stenosis (post) 0% TIMI-flow (post) 3     Vessel - LAD/Segment - proximal  Percent Stenosis (pre)  85% TIMI-flow 3 Stent 3.0 x 20 mm Promus drug-eluting stent postdilated with a 3.5 noncompliant balloon. Percent Stenosis (post) 0% TIMI-flow (post) 3   Final Conclusions:   Successful angioplasty and drug-eluting stent placement to the proximal/mid left circumflex as well as proximal left anterior descending artery.    Recommendations:  Recommend dual antiplatelet therapy for at least one year. Aggressive treatment of risk factors is recommended.   Assessment and Plan   1. CAD - denies any recnet symptoms - continue current meds - EKG today shows SR, no ischemic changes, PVCs   2. HTN - on higher than recommended doses of norvasc, lower to '10mg'$  daily - she will call us with home bp's in 1 week, if ongoing HTN would start aldactone.    3. Hyperlipidemia - continue atorvastatin, request labs from pcp  F/u 6 months    Arnoldo Lenis, M.D.

## 2021-03-11 ENCOUNTER — Ambulatory Visit: Payer: Medicare HMO | Admitting: Podiatry

## 2021-04-15 ENCOUNTER — Other Ambulatory Visit: Payer: Self-pay | Admitting: Cardiology

## 2021-07-26 ENCOUNTER — Encounter: Payer: Self-pay | Admitting: Cardiology

## 2021-07-26 ENCOUNTER — Ambulatory Visit: Payer: Medicare Other | Admitting: Cardiology

## 2021-07-26 ENCOUNTER — Other Ambulatory Visit: Payer: Self-pay

## 2021-07-26 VITALS — BP 126/50 | HR 60 | Ht 66.0 in | Wt 287.4 lb

## 2021-07-26 DIAGNOSIS — E782 Mixed hyperlipidemia: Secondary | ICD-10-CM | POA: Diagnosis not present

## 2021-07-26 DIAGNOSIS — I2510 Atherosclerotic heart disease of native coronary artery without angina pectoris: Secondary | ICD-10-CM

## 2021-07-26 DIAGNOSIS — I10 Essential (primary) hypertension: Secondary | ICD-10-CM

## 2021-07-26 NOTE — Progress Notes (Signed)
? ? ? ?Clinical Summary ?Sandra Schneider is a 66 y.o.female seen today for follow up of the following medical problems ?  ?1. CAD ?- history of NSTEMI 06/2012, received DES to prox LAD and DES to mid LCX ?- 06/2012 echo LVEF 55-60%, grade I DDx ?  ?- no recent chest pains, no SOB/DOE ?- compliant with meds ? - doing water aerboics, silver sneakers ?  ?  ?2. HTN ?- compliant with meds ?- home bp's 100s-120s/50s-60s ?  ?  ?  ?3. Hyperlipidemia ?- labs followed by pcp ?- she is on atorvastatin '40mg'$  daily ? ?-upcoming labs with pcp ?  ?  ? 4. Breast cancer ?- recent lumpectomy ?- reports just being monitored at this time.  ?  ?5. Varicose veins ?- followed by vascular ?  ?SH: son is Randa Ngo, patient here ?[place].  ? ? ?Past Medical History:  ?Diagnosis Date  ? Coronary artery disease   ? Diabetes mellitus   ? GERD (gastroesophageal reflux disease)   ? Hypertension   ? Myocardial abscess   ? Myocardial infarct [facility]) 06/24/2012  ? ? ? ?No Known Allergies ? ? ?Current Outpatient Medications  ?Medication Sig Dispense Refill  ? amLODipine (NORVASC) 10 MG tablet Take 1 tablet by mouth once daily 90 tablet 0  ? aspirin EC 81 MG tablet Take 81 mg by mouth daily.    ? atorvastatin (LIPITOR) 40 MG tablet Take 1 tablet by mouth once daily 90 tablet 3  ? chlorthalidone (HYGROTON) 25 MG tablet Take 1 tablet by mouth once daily 90 tablet 3  ? Cholecalciferol (VITAMIN D) 50 MCG (2000 UT) CAPS Take 1 capsule by mouth daily at 6 (six) AM.    ? dorzolamide-timolol (COSOPT) 22.3-6.8 MG/ML ophthalmic solution Place 1 drop into both eyes 2 (two) times daily.    ? famotidine (PEPCID) 20 MG tablet Take 20 mg by mouth daily.    ? glipiZIDE (GLUCOTROL) 10 MG tablet Take 10 mg by mouth 2 (two) times daily before a meal.    ? letrozole (FEMARA) 2.5 MG tablet Take 2.5 mg by mouth daily.    ? lisinopril (ZESTRIL) 40 MG tablet Take 1 tablet by mouth once daily 90 tablet 3  ? loratadine (CLARITIN) 10 MG tablet Take 10 mg by mouth daily.     ? metFORMIN (GLUCOPHAGE) 1000 MG tablet Take 1,000 mg by mouth 2 (two) times daily.    ? metoprolol tartrate (LOPRESSOR) 25 MG tablet Take 25 mg by mouth 2 (two) times daily.     ? Multiple Vitamin (MULTIVITAMIN) tablet Take 1 tablet by mouth daily.    ? ?No current facility-administered medications for this visit.  ? ? ? ?Past Surgical History:  ?Procedure Laterality Date  ? ANTERIOR VITRECTOMY Left 07/21/2019  ? Procedure: ANTERIOR VITRECTOMY;  Surgeon: Baruch Goldmann, MD;  Location: [facility];  Service: Ophthalmology;  Laterality: Left;  ? ANTERIOR VITRECTOMY Right 08/04/2019  ? Procedure: ANTERIOR VITRECTOMY;  Surgeon: Baruch Goldmann, MD;  Location: [facility];  Service: Ophthalmology;  Laterality: Right;  ? CARDIAC CATHETERIZATION    ? CATARACT EXTRACTION W/PHACO Left 07/21/2019  ? Procedure: CATARACT EXTRACTION PHACO AND INTRAOCULAR LENS PLACEMENT (IOC) (CDE: 11.45);  Surgeon: Baruch Goldmann, MD;  Location: [facility];  Service: Ophthalmology;  Laterality: Left;  ? CATARACT EXTRACTION W/PHACO Right 08/04/2019  ? Procedure: CATARACT EXTRACTION PHACO AND INTRAOCULAR LENS PLACEMENT (IOC);  Surgeon: Baruch Goldmann, MD;  Location: [facility];  Service: Ophthalmology;  Laterality: Right;  CDE: 9.78  ?  CESAREAN SECTION    ? CORONARY STENT PLACEMENT N/A 06/24/2012  ? LEFT HEART CATHETERIZATION WITH CORONARY ANGIOGRAM N/A 06/24/2012  ? Procedure: LEFT HEART CATHETERIZATION WITH CORONARY ANGIOGRAM;  Surgeon: Jolaine Artist, MD;  Location: [facility] CATH LAB;  Service: Cardiovascular;  Laterality: N/A;  ? ? ? ?No Known Allergies ? ? ? ?Family History  ?Problem Relation Age of Onset  ? Heart disease Father   ? Diabetes Other   ? Hypertension Other   ? Cancer Other   ? ? ? ?Social History ?Ms. Bogusz reports that she has never smoked. She has never used smokeless tobacco. ?Ms. Holts reports no history of alcohol use. ? ? ?Review of Systems ?CONSTITUTIONAL: No weight loss, fever, chills, weakness or fatigue.  ?HEENT: Eyes: No visual loss,  blurred vision, double vision or yellow sclerae.No hearing loss, sneezing, congestion, runny nose or sore throat.  ?SKIN: No rash or itching.  ?CARDIOVASCULAR: per hpi ?RESPIRATORY: No shortness of breath, cough or sputum.  ?GASTROINTESTINAL: No anorexia, nausea, vomiting or diarrhea. No abdominal pain or blood.  ?GENITOURINARY: No burning on urination, no polyuria ?NEUROLOGICAL: No headache, dizziness, syncope, paralysis, ataxia, numbness or tingling in the extremities. No change in bowel or bladder control.  ?MUSCULOSKELETAL: No muscle, back pain, joint pain or stiffness.  ?LYMPHATICS: No enlarged nodes. No history of splenectomy.  ?PSYCHIATRIC: No history of depression or anxiety.  ?ENDOCRINOLOGIC: No reports of sweating, cold or heat intolerance. No polyuria or polydipsia.  ?. ? ? ?Physical Examination ?Today's Vitals  ? 07/26/21 0806  ?BP: (!) 126/50  ?Pulse: 60  ?SpO2: 98%  ?Weight: 287 lb 6.4 oz (130.4 kg)  ?Height: '5\' 6"'$  (1.676 m)  ? ?Body mass index is 46.39 kg/m?. ? ?Gen: resting comfortably, no acute distress ?HEENT: no scleral icterus, pupils equal round and reactive, no palptable cervical adenopathy,  ?CV: RRR, no m/r/g no jvd ?Resp: Clear to auscultation bilaterally ?GI: abdomen is soft, non-tender, non-distended, normal bowel sounds, no hepatosplenomegaly ?MSK: extremities are warm, no edema.  ?Skin: warm, no rash ?Neuro:  no focal deficits ?Psych: appropriate affect ? ? ?Diagnostic Studies ?06/2012 echo ?Study Conclusions  ? ?- Left ventricle: The cavity size was normal. Wall thickness  ?  was normal. Systolic function was normal. The estimated  ?  ejection fraction was in the range of 55% to 60%. Wall  ?  motion was normal; there were no regional wall motion  ?  abnormalities. Doppler parameters are consistent with  ?  abnormal left ventricular relaxation (grade 1 diastolic  ?  dysfunction). Doppler parameters are consistent with high  ?  ventricular filling pressure.  ?- Mitral valve: Calcified  annulus.  ?- Left atrium: The atrium was mildly dilated.  ?- Atrial septum: There was increased thickness of the  ?  septum, consistent with lipomatous hypertrophy.  ?  ?  ?06/2012 cath ?  ?  ?Findings: ?  ?Ao Pressure: 125/59 (83) ?LV Pressure:  137/11/20 ?There was no signficant gradient across the aortic valve on pullback. ?  ?Left main: Short. Normal ?  ?LAD: Large vessel with proximal 80% lesion. Mid vessel 30%. Distal vessel has diffuse moderate disease.  ?  ?LCX: Large vessel gives off 3 OMs. 99% midsection followed by 40%. Distal vessel is small with 70% lesion.  ?  ?RCA: Dominant. Normal.  ?  ?LV-gram done in the RAO projection: Ejection fraction = 60%. No regional wall motion abnormalities.  ?  ?Assessment: ?1. 2V CAD ?2. Normal LV function ?3. NSTEMI ?  ?  Plan/Discussion: ?  ?Plan PCI of LCX & LAD. ?  ?  ?PCI Data: ?Vessel - proximal and mid left circumflex/Segment - proximal and mid ?Percent Stenosis (pre)  95% ?TIMI-flow 3 ?Stent 3.0 x 32 mm Promus drug-eluting stent postdilated with a 3.5 noncompliant balloon. ?Percent Stenosis (post) 0% ?TIMI-flow (post) 3 ?  ?  ?Vessel - LAD/Segment - proximal  ?Percent Stenosis (pre)  85% ?TIMI-flow 3 ?Stent 3.0 x 20 mm Promus drug-eluting stent postdilated with a 3.5 noncompliant balloon. ?Percent Stenosis (post) 0% ?TIMI-flow (post) 3 ?  ?Final Conclusions:   ?Successful angioplasty and drug-eluting stent placement to the proximal/mid left circumflex as well as proximal left anterior descending artery.  ?  ?Recommendations:  ?Recommend dual antiplatelet therapy for at least one year. Aggressive treatment of risk factors is recommended. ?  ? ? ? ?Assessment and Plan  ? ?1. CAD ?-no symptoms, continue current meds ?  ?2. HTN ?- at goal, continue current meds ? ?3. Hyperlipidemia ?- request pcp labs, she will continue atovastatin ? ?F/u 6 months ? ? ? ? ?Arnoldo Lenis, M.D. ?

## 2021-07-26 NOTE — Patient Instructions (Signed)

## 2021-09-22 ENCOUNTER — Other Ambulatory Visit: Payer: Self-pay | Admitting: Cardiology

## 2021-10-02 ENCOUNTER — Other Ambulatory Visit: Payer: Self-pay | Admitting: Cardiology

## 2021-11-24 IMAGING — MG DIGITAL SCREENING BILAT W/ TOMO W/ CAD
8 of 15 series · 8 of 40 positions shown · non-contrast
Comparison: Previous exam(s).

CLINICAL DATA: Screening.

EXAM:
DIGITAL SCREENING BILATERAL MAMMOGRAM WITH TOMO AND CAD

[R CC synth-2D (1 of 2)]
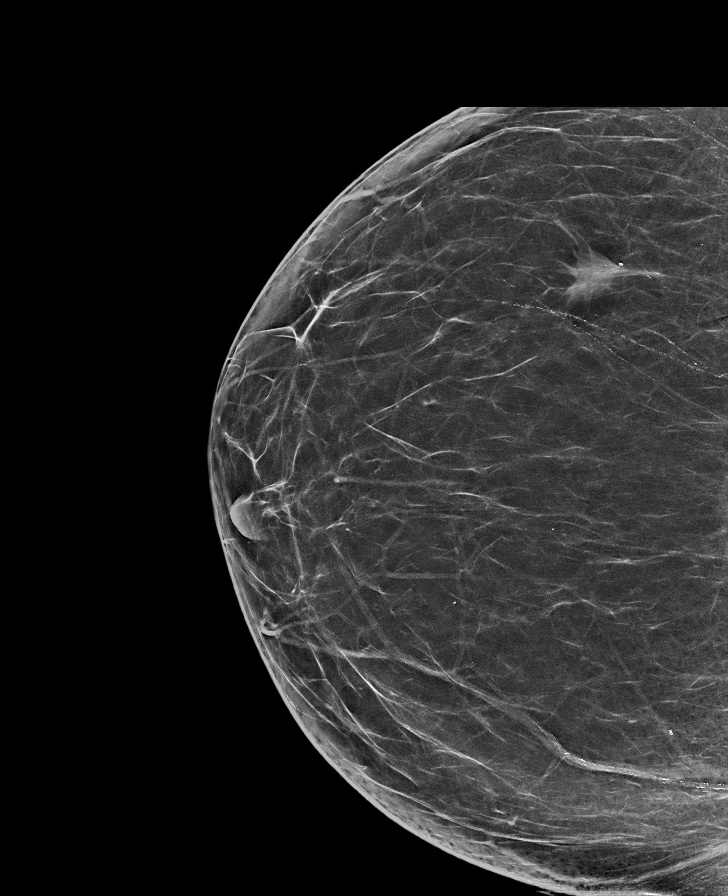

[R CC synth-2D (2 of 2)]
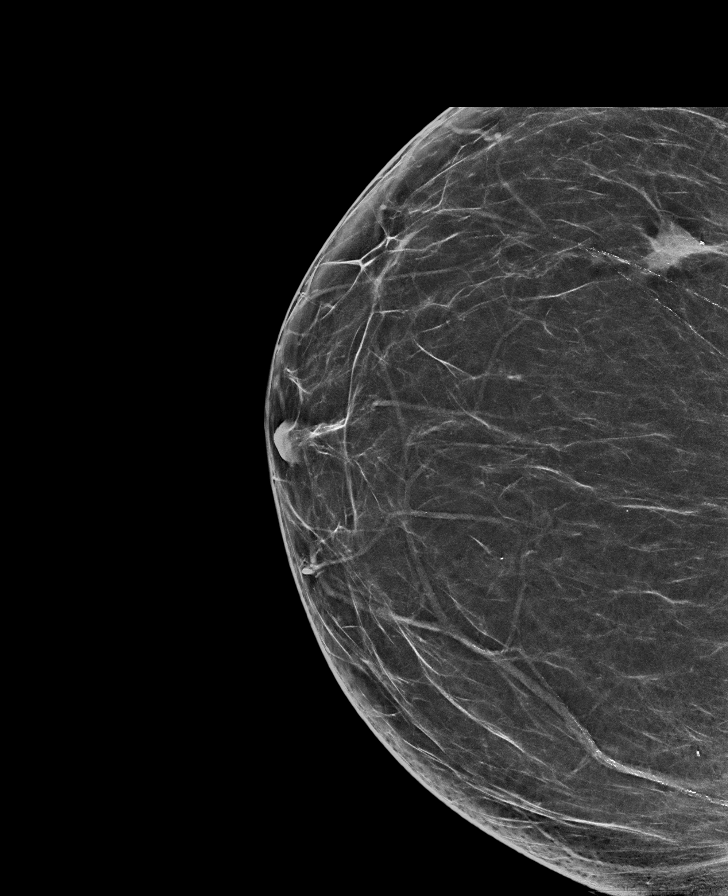

[L CC synth-2D (1 of 2)]
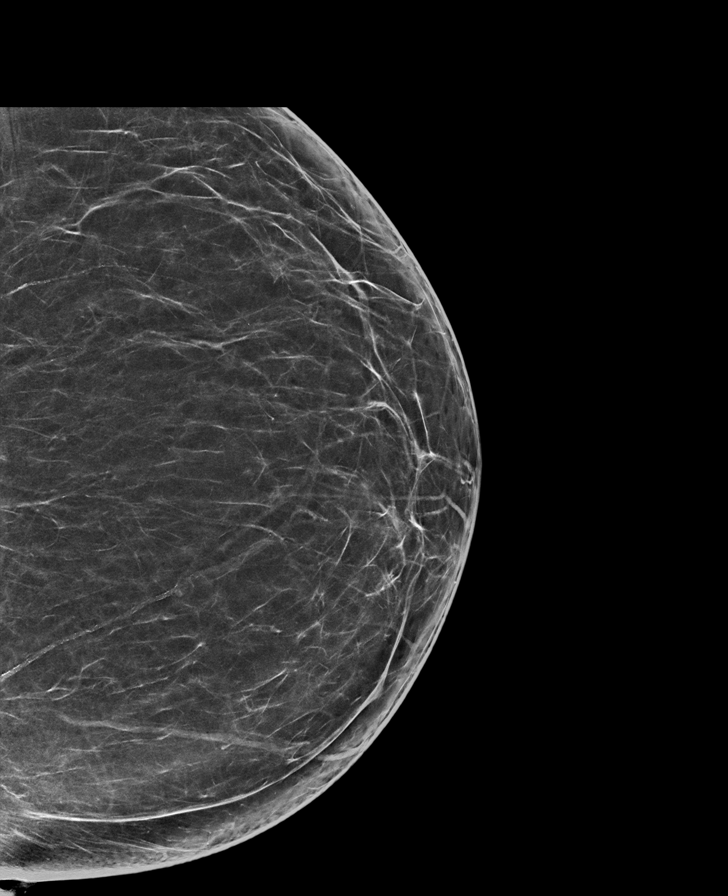

[R MLO synth-2D (1 of 2)]
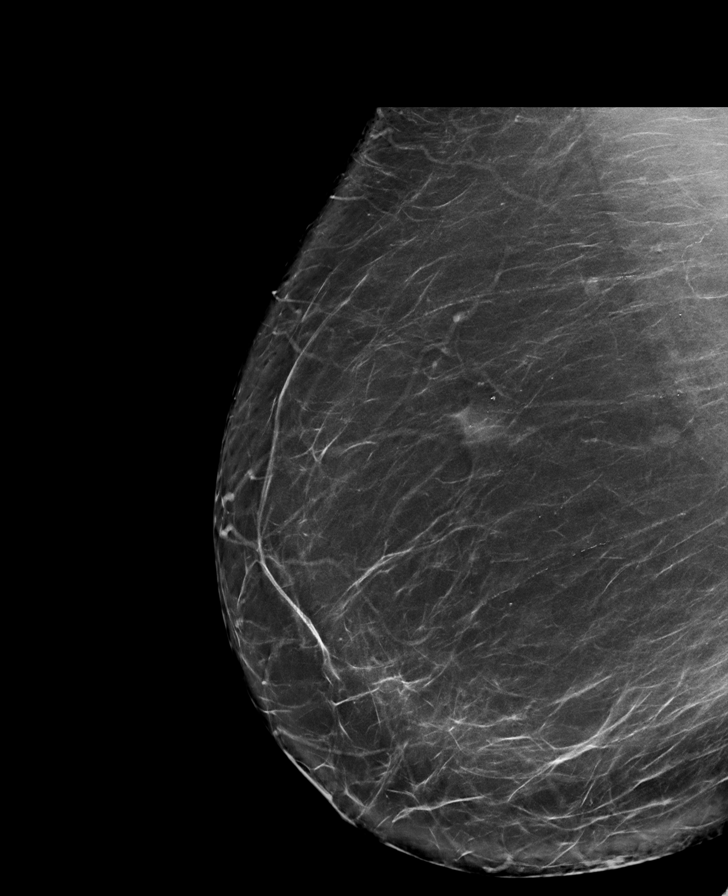

[L CC synth-2D (2 of 2)]
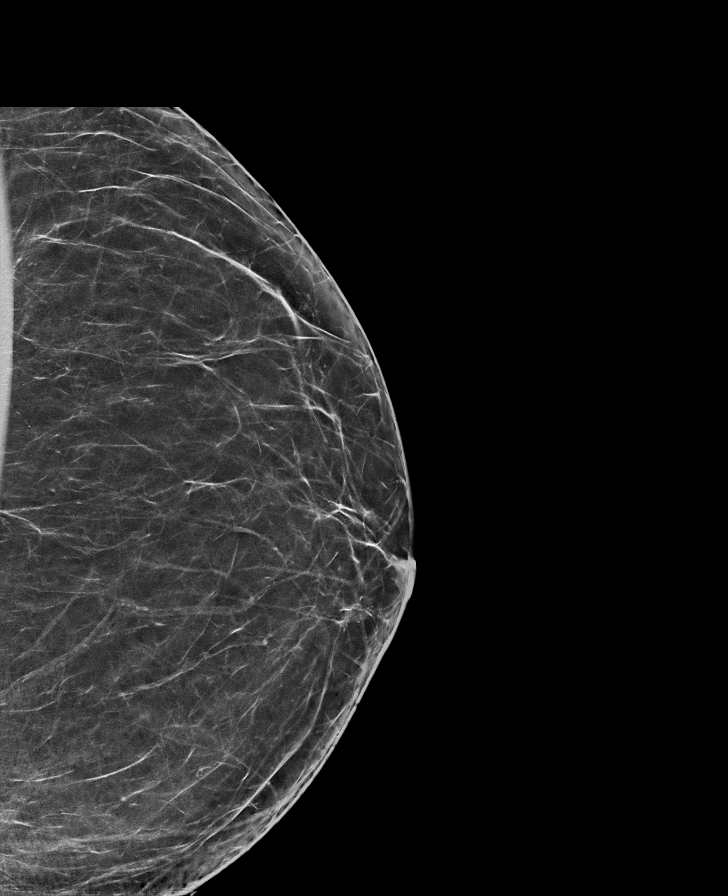

[L MLO synth-2D]
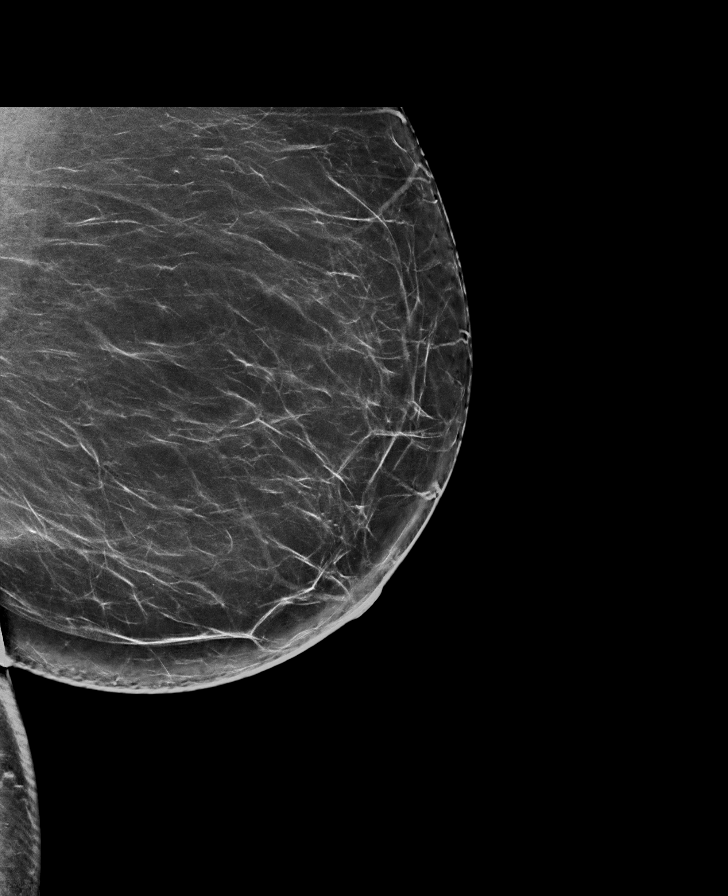

[R MLO synth-2D (2 of 2)]
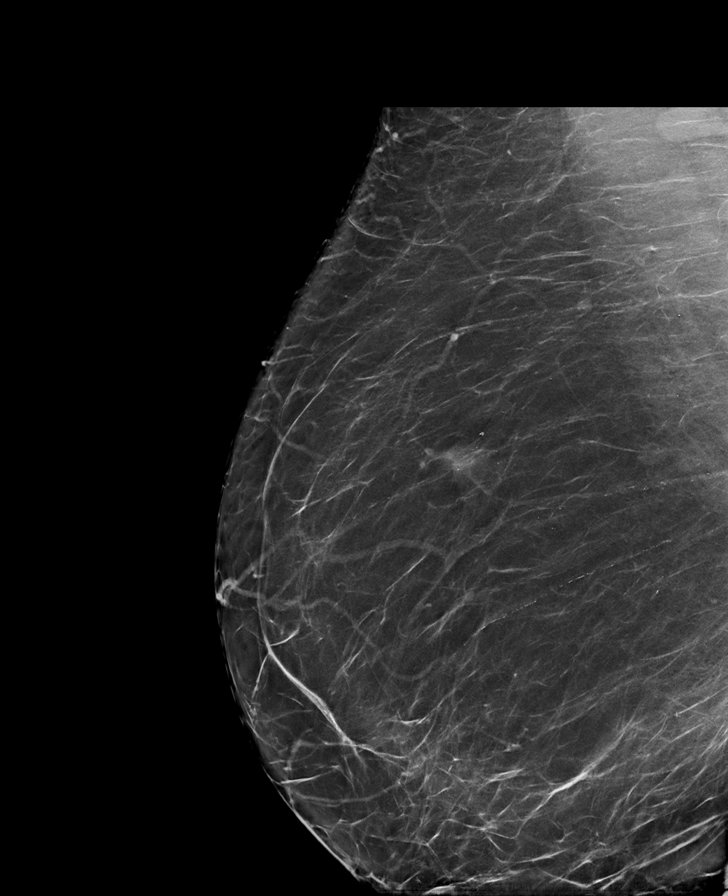

[R CC tomo · tomo slice 56/82.0]
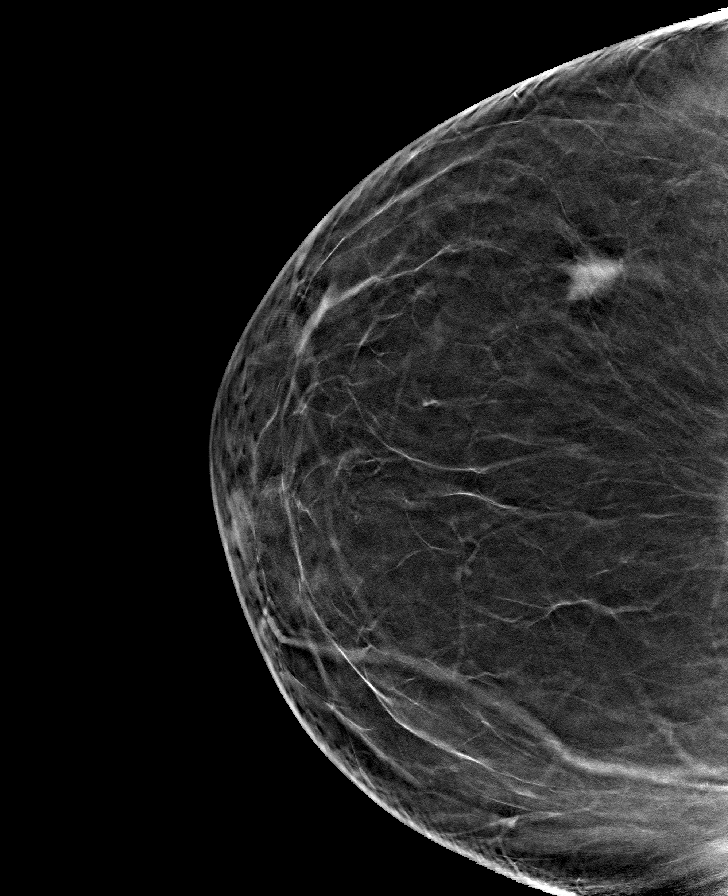

[8 of 40 positions shown; findings below may reference images not displayed]

ACR Breast Density Category b: There are scattered areas of
fibroglandular density.
FINDINGS: In the right breast, a possible asymmetry warrants further
evaluation. In the left breast, no findings suspicious for
malignancy. Images were processed with CAD.
IMPRESSION: Further evaluation is suggested for possible asymmetry in the right
breast.

RECOMMENDATION:
Diagnostic mammogram and possibly ultrasound of the right breast.
(Code:[id number])

The patient will be contacted regarding the findings, and additional
imaging will be scheduled.

BI-RADS CATEGORY  0: Incomplete. Need additional imaging evaluation
and/or prior mammograms for comparison.

## 2022-01-11 ENCOUNTER — Ambulatory Visit: Payer: Medicare Other | Admitting: Physician Assistant

## 2022-01-12 ENCOUNTER — Ambulatory Visit: Payer: Medicare Other | Attending: Physician Assistant | Admitting: Physician Assistant

## 2022-01-12 ENCOUNTER — Encounter: Payer: Self-pay | Admitting: Physician Assistant

## 2022-01-12 VITALS — BP 122/74 | HR 64 | Ht 66.0 in | Wt 284.0 lb

## 2022-01-12 DIAGNOSIS — C50919 Malignant neoplasm of unspecified site of unspecified female breast: Secondary | ICD-10-CM | POA: Diagnosis not present

## 2022-01-12 DIAGNOSIS — R0609 Other forms of dyspnea: Secondary | ICD-10-CM

## 2022-01-12 DIAGNOSIS — I2510 Atherosclerotic heart disease of native coronary artery without angina pectoris: Secondary | ICD-10-CM

## 2022-01-12 DIAGNOSIS — I10 Essential (primary) hypertension: Secondary | ICD-10-CM

## 2022-01-12 DIAGNOSIS — E785 Hyperlipidemia, unspecified: Secondary | ICD-10-CM

## 2022-01-12 NOTE — Patient Instructions (Signed)
Medication Instructions:  Your physician recommends that you continue on your current medications as directed. Please refer to the Current Medication list given to you today.  *If you need a refill on your cardiac medications before your next appointment, please call your pharmacy*   Lab Work: None If you have labs (blood work) drawn today and your tests are completely normal, you will receive your results only by: [place] (if you have MyChart) OR A paper copy in the mail If you have any lab test that is abnormal or we need to change your treatment, we will call you to review the results.   Testing/Procedures: Your physician has requested that you have an echocardiogram. Echocardiography is a painless test that uses sound waves to create images of your heart. It provides your doctor with information about the size and shape of your heart and how well your heart's chambers and valves are working. This procedure takes approximately one hour. There are no restrictions for this procedure.    Follow-Up: At [facility], you and your health needs are our priority.  As part of our continuing mission to provide you with exceptional heart care, we have created designated Provider Care Teams.  These Care Teams include your primary Cardiologist (physician) and Advanced Practice Providers (APPs -  Physician Assistants and Nurse Practitioners) who all work together to provide you with the care you need, when you need it.  We recommend signing up for the patient portal called "MyChart".  Sign up information is provided on this After Visit Summary.  MyChart is used to connect with patients for Virtual Visits (Telemedicine).  Patients are able to view lab/test results, encounter notes, upcoming appointments, etc.  Non-urgent messages can be sent to your provider as well.   To learn more about what you can do with MyChart, go to [email or web address].    Your next appointment:   01/27/22 at  8:40 AM  The format for your next appointment:   In Person  Provider:   Carlyle Dolly, MD   Important Information About [place]

## 2022-01-12 NOTE — Progress Notes (Signed)
Office Visit    Patient Name: Sandra Schneider Date of Encounter: 01/12/2022  PCP:  Leonie Douglas, [place]  Cardiologist:  Carlyle Dolly, MD  Advanced Practice Provider:  No care team member to display Electrophysiologist:  None   HPI    Sandra Schneider is a 66 y.o. female CAD s/p PCI 2014, diabetes mellitus, GERD, hypertension presents today for evaluation of heart rhythm.   Today, she is having increased SOB with exertion and does have some swelling occasionally in her lower extemity when she doe snot do any activity. The DOE started July and she endorses fatigue as well. She was going to water aerobics twice a week but has not been going to recently. She has not had any palpitations or felt her heart skip a beat. EKG shows NSR without PVCs or PACs.   Reports no chest pain, pressure, or tightness. No edema, orthopnea, PND. Reports no palpitations.   Past Medical History    Past Medical History:  Diagnosis Date   Coronary artery disease    Diabetes mellitus    GERD (gastroesophageal reflux disease)    Hypertension    Myocardial abscess    Myocardial infarct ([place]) 06/24/2012   Past Surgical History:  Procedure Laterality Date   ANTERIOR VITRECTOMY Left 07/21/2019   Procedure: ANTERIOR VITRECTOMY;  Surgeon: Baruch Goldmann, MD;  Location: [facility];  Service: Ophthalmology;  Laterality: Left;   ANTERIOR VITRECTOMY Right 08/04/2019   Procedure: ANTERIOR VITRECTOMY;  Surgeon: Baruch Goldmann, MD;  Location: [facility];  Service: Ophthalmology;  Laterality: Right;   CARDIAC CATHETERIZATION     CATARACT EXTRACTION W/PHACO Left 07/21/2019   Procedure: CATARACT EXTRACTION PHACO AND INTRAOCULAR LENS PLACEMENT (IOC) (CDE: 11.45);  Surgeon: Baruch Goldmann, MD;  Location: [facility];  Service: Ophthalmology;  Laterality: Left;   CATARACT EXTRACTION W/PHACO Right 08/04/2019   Procedure: CATARACT EXTRACTION PHACO AND INTRAOCULAR LENS PLACEMENT (IOC);   Surgeon: Baruch Goldmann, MD;  Location: [facility];  Service: Ophthalmology;  Laterality: Right;  CDE: 9.78   CESAREAN SECTION     CORONARY STENT PLACEMENT N/A 06/24/2012   LEFT HEART CATHETERIZATION WITH CORONARY ANGIOGRAM N/A 06/24/2012   Procedure: LEFT HEART CATHETERIZATION WITH CORONARY ANGIOGRAM;  Surgeon: Jolaine Artist, MD;  Location[facility] CATH LAB;  Service: Cardiovascular;  Laterality: N/A;    Allergies  No Known Allergies   EKGs/Labs/Other Studies Reviewed:   The following studies were reviewed today: 06/2012 echo Study Conclusions   - Left ventricle: The cavity size was normal. Wall thickness    was normal. Systolic function was normal. The estimated    ejection fraction was in the range of 55% to 60%. Wall    motion was normal; there were no regional wall motion    abnormalities. Doppler parameters are consistent with    abnormal left ventricular relaxation (grade 1 diastolic    dysfunction). Doppler parameters are consistent with high    ventricular filling pressure.  - Mitral valve: Calcified annulus.  - Left atrium: The atrium was mildly dilated.  - Atrial septum: There was increased thickness of the    septum, consistent with lipomatous hypertrophy.      06/2012 cath     Findings:   Ao Pressure: 125/59 (83) LV Pressure:  137/11/20 There was no signficant gradient across the aortic valve on pullback.   Left main: Short. Normal   LAD: Large vessel with proximal 80% lesion. Mid vessel 30%. Distal vessel has diffuse moderate disease.  LCX: Large vessel gives off 3 OMs. 99% midsection followed by 40%. Distal vessel is small with 70% lesion.    RCA: Dominant. Normal.    LV-gram done in the RAO projection: Ejection fraction = 60%. No regional wall motion abnormalities.    Assessment: 1. 2V CAD 2. Normal LV function 3. NSTEMI   Plan/Discussion:   Plan PCI of LCX & LAD.     PCI Data: Vessel - proximal and mid left circumflex/Segment - proximal and  mid Percent Stenosis (pre)  95% TIMI-flow 3 Stent 3.0 x 32 mm Promus drug-eluting stent postdilated with a 3.5 noncompliant balloon. Percent Stenosis (post) 0% TIMI-flow (post) 3     Vessel - LAD/Segment - proximal  Percent Stenosis (pre)  85% TIMI-flow 3 Stent 3.0 x 20 mm Promus drug-eluting stent postdilated with a 3.5 noncompliant balloon. Percent Stenosis (post) 0% TIMI-flow (post) 3   Final Conclusions:   Successful angioplasty and drug-eluting stent placement to the proximal/mid left circumflex as well as proximal left anterior descending artery.    Recommendations:  Recommend dual antiplatelet therapy for at least one year. Aggressive treatment of risk factors is recommended.  EKG:  EKG is ordered today.  The ekg ordered today demonstrates normal sinus rhythm, 64 bpm  Recent Labs: No results found for requested labs within last 365 days.  Recent Lipid Panel    Component Value Date/Time   CHOL 156 06/24/2012 0102   TRIG 135 06/24/2012 0102   HDL 37 (L) 06/24/2012 0102   CHOLHDL 4.2 06/24/2012 0102   VLDL 27 06/24/2012 0102   LDLCALC 92 06/24/2012 0102    Home Medications   Current Meds  Medication Sig   amLODipine (NORVASC) 10 MG tablet Take 1 tablet by mouth once daily   aspirin EC 81 MG tablet Take 81 mg by mouth daily.   atorvastatin (LIPITOR) 40 MG tablet Take 1 tablet by mouth once daily   chlorthalidone (HYGROTON) 25 MG tablet Take 1 tablet by mouth once daily   Cholecalciferol (VITAMIN D) 50 MCG (2000 UT) CAPS Take 1 capsule by mouth daily at 6 (six) AM.   dorzolamide-timolol (COSOPT) 22.3-6.8 MG/ML ophthalmic solution Place 1 drop into both eyes 2 (two) times daily.   famotidine (PEPCID) 20 MG tablet Take 20 mg by mouth daily.   glipiZIDE (GLUCOTROL) 10 MG tablet Take 10 mg by mouth 2 (two) times daily before a meal.   letrozole (FEMARA) 2.5 MG tablet Take 2.5 mg by mouth daily.   lisinopril (ZESTRIL) 40 MG tablet Take 1 tablet by mouth once daily    loratadine (CLARITIN) 10 MG tablet Take 10 mg by mouth daily.   metFORMIN (GLUCOPHAGE) 1000 MG tablet Take 1,000 mg by mouth 2 (two) times daily.   metoprolol tartrate (LOPRESSOR) 25 MG tablet Take 25 mg by mouth 2 (two) times daily.    Multiple Vitamin (MULTIVITAMIN) tablet Take 1 tablet by mouth daily.     Review of Systems      All other systems reviewed and are otherwise negative except as noted above.  Physical Exam    VS:  BP 122/74   Pulse 64   Ht '5\' 6"'$  (1.676 m)   Wt 284 lb (128.8 kg)   LMP 01/14/2011   SpO2 95%   BMI 45.84 kg/m  , BMI Body mass index is 45.84 kg/m.  Wt Readings from Last 3 Encounters:  01/12/22 284 lb (128.8 kg)  07/26/21 287 lb 6.4 oz (130.4 kg)  01/26/21 294 lb (133.4 kg)  GEN: Well nourished, well developed, in no acute distress. HEENT: normal. Neck: Supple, no JVD, carotid bruits, or masses. Cardiac: RRR, no murmurs, rubs, or gallops. No clubbing, cyanosis, edema.  Radials/PT 2+ and equal bilaterally.  Respiratory:  Respirations regular and unlabored, clear to auscultation bilaterally. GI: Soft, nontender, nondistended. MS: No deformity or atrophy. Skin: Warm and dry, no rash. Neuro:  Strength and sensation are intact. Psych: Normal affect.  Assessment & Plan    Extra beats on EKG -EKG is normal today without any PACs or PVCs -Patient does not endorse any palpitations or feeling like her heart is skipping a beat -Continue current medications  CAD status post NSTEMI 06/2012 received DES to proximal LAD and DES to mid Lcx -Patient states that she gets tired really easily and this started happening about the middle of July -She notices more shortness of breath with activities that she used to do easily -We will set up a an echocardiogram at the [facility] or [place] office  Hypertension -Continue current medication with Norvasc 10 mg daily, Lipitor 40 mg daily, aspirin 81 mg daily, chlorthalidone 25 mg daily, lisinopril 40 mg daily,  Lopressor 25 mg twice daily  Hyperlipidemia -Continue current medication regimen with Lipitor 40 mg daily  Hx of Breast cancer -This could certainly be contributing to her fatigue           Disposition: Follow up 2 months with Carlyle Dolly, MD or APP.  Signed, Elgie Collard, PA-C 01/12/2022, 4:51 PM [facility]

## 2022-01-18 ENCOUNTER — Ambulatory Visit (HOSPITAL_COMMUNITY)
Admission: RE | Admit: 2022-01-18 | Discharge: 2022-01-18 | Disposition: A | Discharge status: Home / Self Care | Payer: Medicare Other | Source: Ambulatory Visit | Attending: Physician Assistant | Admitting: Physician Assistant

## 2022-01-18 DIAGNOSIS — R0609 Other forms of dyspnea: Secondary | ICD-10-CM | POA: Insufficient documentation

## 2022-01-18 LAB — ECHOCARDIOGRAM COMPLETE
AR max vel: 2.14 cm2
AV Area VTI: 2.32 cm2
AV Area mean vel: 2.09 cm2
AV Mean grad: 4 mmHg
AV Peak grad: 7.6 mmHg
Ao pk vel: 1.38 m/s
Area-P 1/2: 2.86 cm2
MV VTI: 1.57 cm2
S' Lateral: 2.9 cm

## 2022-01-18 NOTE — Progress Notes (Signed)
*  PRELIMINARY RESULTS* Echocardiogram 2D Echocardiogram has been performed.  Sandra Schneider 01/18/2022, 9:23 AM

## 2022-01-26 NOTE — Progress Notes (Unsigned)
Clinical Summary Sandra Schneider is a 66 y.o.female   seen today for follow up of the following medical problems   1. CAD - history of NSTEMI 06/2012, received DES to prox LAD and DES to mid LCX - 06/2012 echo LVEF 55-60%, grade I DDx   - no recent chest pains, no SOB/DOE - compliant with meds  - doing water aerboics, silver sneakers    - recent DOE - 01/2022 echo LVEF 65-70%, no WMAs, grade I dd, mild RV dysfunction, mod to severely enlarged RV, severe pulm HTN PASP 80, mild mitrla stenosis,    2. HTN - compliant with meds - home bp's 100s-120s/50s-60s       3. Hyperlipidemia - labs followed by pcp - she is on atorvastatin 40mg  daily   -upcoming labs with pcp      4. Breast cancer - recent lumpectomy - reports just being monitored at this time.    5. 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    Past Medical History:  Diagnosis Date   Coronary artery disease    Diabetes mellitus    GERD (gastroesophageal reflux disease)    Hypertension    Myocardial abscess    Myocardial infarct (HCC) 06/24/2012     No Known Allergies   Current Outpatient Medications  Medication Sig Dispense Refill   amLODipine (NORVASC) 10 MG tablet Take 1 tablet by mouth once daily 90 tablet 0   aspirin EC 81 MG tablet Take 81 mg by mouth daily.     atorvastatin (LIPITOR) 40 MG tablet Take 1 tablet by mouth once daily 90 tablet 3   chlorthalidone (HYGROTON) 25 MG tablet Take 1 tablet by mouth once daily 90 tablet 1   Cholecalciferol (VITAMIN D) 50 MCG (2000 UT) CAPS Take 1 capsule by mouth daily at 6 (six) AM.     dorzolamide-timolol (COSOPT) 22.3-6.8 MG/ML ophthalmic solution Place 1 drop into both eyes 2 (two) times daily.     famotidine (PEPCID) 20 MG tablet Take 20 mg by mouth daily.     glipiZIDE (GLUCOTROL) 10 MG tablet Take 10 mg by mouth 2 (two) times daily before a meal.     letrozole (FEMARA) 2.5 MG tablet Take 2.5  mg by mouth daily.     lisinopril (ZESTRIL) 40 MG tablet Take 1 tablet by mouth once daily 90 tablet 1   loratadine (CLARITIN) 10 MG tablet Take 10 mg by mouth daily.     metFORMIN (GLUCOPHAGE) 1000 MG tablet Take 1,000 mg by mouth 2 (two) times daily.     metoprolol tartrate (LOPRESSOR) 25 MG tablet Take 25 mg by mouth 2 (two) times daily.      Multiple Vitamin (MULTIVITAMIN) tablet Take 1 tablet by mouth daily.     No current facility-administered medications for this visit.     Past Surgical History:  Procedure Laterality Date   ANTERIOR VITRECTOMY Left 07/21/2019   Procedure: ANTERIOR VITRECTOMY;  Surgeon: Fabio Pierce, MD;  Location: [facility];  Service: Ophthalmology;  Laterality: Left;   ANTERIOR VITRECTOMY Right 08/04/2019   Procedure: ANTERIOR VITRECTOMY;  Surgeon: Fabio Pierce, MD;  Location: [facility];  Service: Ophthalmology;  Laterality: Right;   CARDIAC CATHETERIZATION     CATARACT EXTRACTION W/PHACO Left 07/21/2019   Procedure: CATARACT EXTRACTION PHACO AND INTRAOCULAR LENS PLACEMENT (IOC) (CDE: 11.45);  Surgeon: Fabio Pierce, MD;  Location: [facility];  Service: Ophthalmology;  Laterality: Left;  CATARACT EXTRACTION W/PHACO Right 08/04/2019   Procedure: CATARACT EXTRACTION PHACO AND INTRAOCULAR LENS PLACEMENT (IOC);  Surgeon: Fabio Pierce, MD;  Location: [facility];  Service: Ophthalmology;  Laterality: Right;  CDE: 9.78   CESAREAN SECTION     CORONARY STENT PLACEMENT N/A 06/24/2012   LEFT HEART CATHETERIZATION WITH CORONARY ANGIOGRAM N/A 06/24/2012   Procedure: LEFT HEART CATHETERIZATION WITH CORONARY ANGIOGRAM;  Surgeon: Dolores Patty, MD;  Location: [facility] C[facility] LAB;  Service: Cardiovascular;  Laterality: N/A;     No Known Allergies    Family History  Problem Relation Age of Onset   Heart disease Father    Diabetes Other    Hypertension Other    Cancer Other      Social History Sandra Schneider reports that she has never smoked. She has never used smokeless  tobacco. Sandra Schneider reports no history of alcohol use.   Review of Systems CONSTITUTIONAL: No weight loss, fever, chills, weakness or fatigue.  HEENT: Eyes: No visual loss, blurred vision, double vision or yellow sclerae.No hearing loss, sneezing, congestion, runny nose or sore throat.  SKIN: No rash or itching.  CARDIOVASCULAR:  RESPIRATORY: No shortness of breath, cough or sputum.  GASTROINTESTINAL: No anorexia, nausea, vomiting or diarrhea. No abdominal pain or blood.  GENITOURINARY: No burning on urination, no polyuria NEUROLOGICAL: No headache, dizziness, syncope, paralysis, ataxia, numbness or tingling in the extremities. No change in bowel or bladder control.  MUSCULOSKELETAL: No muscle, back pain, joint pain or stiffness.  LYMPHATICS: No enlarged nodes. No history of splenectomy.  PSYCHIATRIC: No history of depression or anxiety.  ENDOCRINOLOGIC: No reports of sweating, cold or heat intolerance. No polyuria or polydipsia.  Marland Kitchen   Physical Examination There were no vitals filed for this visit. There were no vitals filed for this visit.  Gen: resting comfortably, no acute distress HEENT: no scleral icterus, pupils equal round and reactive, no palptable cervical adenopathy,  CV Resp: Clear to auscultation bilaterally GI: abdomen is soft, non-tender, non-distended, normal bowel sounds, no hepatosplenomegaly MSK: extremities are warm, no edema.  Skin: warm, no rash Neuro:  no focal deficits Psych: appropriate affect   Diagnostic Studies  06/2012 echo Study Conclusions   - Left ventricle: The cavity size was normal. Wall thickness    was normal. Systolic function was normal. The estimated    ejection fraction was in the range of 55% to 60%. Wall    motion was normal; there were no regional wall motion    abnormalities. Doppler parameters are consistent with    abnormal left ventricular relaxation (grade 1 diastolic    dysfunction). Doppler parameters are consistent  with high    ventricular filling pressure.  - Mitral valve: Calcified annulus.  - Left atrium: The atrium was mildly dilated.  - Atrial septum: There was increased thickness of the    septum, consistent with lipomatous hypertrophy.      06/2012 cath     Findings:   Ao Pressure: 125/59 (83) LV Pressure:  137/11/20 There was no signficant gradient across the aortic valve on pullback.   Left main: Short. Normal   LAD: Large vessel with proximal 80% lesion. Mid vessel 30%. Distal vessel has diffuse moderate disease.    LCX: Large vessel gives off 3 OMs. 99% midsection followed by 40%. Distal vessel is small with 70% lesion.    RCA: Dominant. Normal.    LV-gram done in the RAO projection: Ejection fraction = 60%. No regional wall motion abnormalities.    Assessment: 1.  2V CAD 2. Normal LV function 3. NSTEMI   Plan/Discussion:   Plan PCI of LCX & LAD.     PCI Data: Vessel - proximal and mid left circumflex/Segment - proximal and mid Percent Stenosis (pre)  95% TIMI-flow 3 Stent 3.0 x 32 mm Promus drug-eluting stent postdilated with a 3.5 noncompliant balloon. Percent Stenosis (post) 0% TIMI-flow (post) 3     Vessel - LAD/Segment - proximal  Percent Stenosis (pre)  85% TIMI-flow 3 Stent 3.0 x 20 mm Promus drug-eluting stent postdilated with a 3.5 noncompliant balloon. Percent Stenosis (post) 0% TIMI-flow (post) 3   Final Conclusions:   Successful angioplasty and drug-eluting stent placement to the proximal/mid left circumflex as well as proximal left anterior descending artery.    Recommendations:  Recommend dual antiplatelet therapy for at least one year. Aggressive treatment of risk factors is recommended.   01/2022 echo 1. Left ventricular ejection fraction, by estimation, is 65 to 70%. The  left ventricle has normal function. The left ventricle has no regional  wall motion abnormalities. Left ventricular diastolic parameters are  consistent with Grade I  diastolic  dysfunction (impaired relaxation).   2. Right ventricular systolic function is mildly reduced. The right  ventricular size is moderately to severely enlarged. There is severely  elevated pulmonary artery systolic pressure.   3. Left atrial size was mildly dilated.   4. Right atrial size was mildly dilated.   5. The mitral valve is abnormal. No evidence of mitral valve  regurgitation. Mild mitral stenosis. Moderate mitral annular  calcification.   6. The tricuspid valve is abnormal. Tricuspid valve regurgitation is mild  to moderate.   7. The aortic valve has an indeterminant number of cusps. Aortic valve  regurgitation is not visualized. No aortic stenosis is present.   8. The inferior vena cava is dilated in size with >50% respiratory  variability, suggesting right atrial pressure of 8 mmHg.     Assessment and Plan   1. CAD -no symptoms, continue current meds   2. HTN - at goal, continue current meds   3. Hyperlipidemia - request pcp labs, she will continue atovastatin     Antoine Poche, M.D.

## 2022-01-26 NOTE — H&P (View-Only) (Signed)
Clinical Summary Sandra Schneider is a 66 y.o.female seen today for follow up of the following medical problems   1. CAD - history of NSTEMI 06/2012, received DES to prox LAD and DES to mid LCX - 06/2012 echo LVEF 55-60%, grade I DDx      - 01/2022 echo LVEF 65-70%, no WMAs, grade I dd, mild RV dysfunction, mod to severely enlarged RV, severe pulm HTN PASP 80, mild mitrla stenosis,  - recent DOE, denies any chest pains.    2. Pulmonary HTN - 01/2022 echo LVEF 65-70%, no WMAs, grade I dd, mild RV dysfunction, mod to severely enlarged RV, severe pulm HTN PASP 80, mild mitrla stenosis,    -reports recent DOE 25-50 feet over the last month - no chest pains. Some LE edema at times.  Some cough, wheezing which is chronic - no tobacco history but around 2nd hand smoke - +snoring, +apneic episodes per family, limited daytime somnolence - no history of blood clots.    Other medical issues not addressed this visit   3. HTN - compliant w/ meds       3. Hyperlipidemia - labs followed by pcp - she is on atorvastatin '40mg'$  daily   -upcoming labs with pcp      4. Breast cancer - recent lumpectomy - reports just being monitored at this time.    5. Varicose veins - followed by vascular   SH: son is Randa Ngo, patient here Working security.    Past Medical History:  Diagnosis Date   Coronary artery disease    Diabetes mellitus    GERD (gastroesophageal reflux disease)    Hypertension    Myocardial abscess    Myocardial infarct ([place]) 06/24/2012     No Known Allergies   Current Outpatient Medications  Medication Sig Dispense Refill   amLODipine (NORVASC) 10 MG tablet Take 1 tablet by mouth once daily 90 tablet 0   aspirin EC 81 MG tablet Take 81 mg by mouth daily.     atorvastatin (LIPITOR) 40 MG tablet Take 1 tablet by mouth once daily 90 tablet 3   chlorthalidone (HYGROTON) 25 MG tablet Take 1 tablet by mouth once daily 90 tablet 1   Cholecalciferol (VITAMIN  D) 50 MCG (2000 UT) CAPS Take 1 capsule by mouth daily at 6 (six) AM.     dorzolamide-timolol (COSOPT) 22.3-6.8 MG/ML ophthalmic solution Place 1 drop into both eyes 2 (two) times daily.     famotidine (PEPCID) 20 MG tablet Take 20 mg by mouth daily.     glipiZIDE (GLUCOTROL) 10 MG tablet Take 10 mg by mouth 2 (two) times daily before a meal.     letrozole (FEMARA) 2.5 MG tablet Take 2.5 mg by mouth daily.     lisinopril (ZESTRIL) 40 MG tablet Take 1 tablet by mouth once daily 90 tablet 1   loratadine (CLARITIN) 10 MG tablet Take 10 mg by mouth daily.     metFORMIN (GLUCOPHAGE) 1000 MG tablet Take 1,000 mg by mouth 2 (two) times daily.     metoprolol tartrate (LOPRESSOR) 25 MG tablet Take 25 mg by mouth 2 (two) times daily.      Multiple Vitamin (MULTIVITAMIN) tablet Take 1 tablet by mouth daily.     No current facility-administered medications for this visit.     Past Surgical History:  Procedure Laterality Date   ANTERIOR VITRECTOMY Left 07/21/2019   Procedure: ANTERIOR VITRECTOMY;  Surgeon: Baruch Goldmann, MD;  Location: [facility];  Service: Ophthalmology;  Laterality: Left;   ANTERIOR VITRECTOMY Right 08/04/2019   Procedure: ANTERIOR VITRECTOMY;  Surgeon: Baruch Goldmann, MD;  Location: [facility];  Service: Ophthalmology;  Laterality: Right;   CARDIAC CATHETERIZATION     CATARACT EXTRACTION W/PHACO Left 07/21/2019   Procedure: CATARACT EXTRACTION PHACO AND INTRAOCULAR LENS PLACEMENT (IOC) (CDE: 11.45);  Surgeon: Baruch Goldmann, MD;  Location: [facility];  Service: Ophthalmology;  Laterality: Left;   CATARACT EXTRACTION W/PHACO Right 08/04/2019   Procedure: CATARACT EXTRACTION PHACO AND INTRAOCULAR LENS PLACEMENT (IOC);  Surgeon: Baruch Goldmann, MD;  Location: [facility];  Service: Ophthalmology;  Laterality: Right;  CDE: 9.78   CESAREAN SECTION     CORONARY STENT PLACEMENT N/A 06/24/2012   LEFT HEART CATHETERIZATION WITH CORONARY ANGIOGRAM N/A 06/24/2012   Procedure: LEFT HEART CATHETERIZATION WITH  CORONARY ANGIOGRAM;  Surgeon: Jolaine Artist, MD;  Location: [facility] CATH LAB;  Service: Cardiovascular;  Laterality: N/A;     No Known Allergies    Family History  Problem Relation Age of Onset   Heart disease Father    Diabetes Other    Hypertension Other    Cancer Other      Social History Sandra Schneider reports that she has never smoked. She has never used smokeless tobacco. Sandra Schneider reports no history of alcohol use.   Review of Systems CONSTITUTIONAL: No weight loss, fever, chills, weakness or fatigue.  HEENT: Eyes: No visual loss, blurred vision, double vision or yellow sclerae.No hearing loss, sneezing, congestion, runny nose or sore throat.  SKIN: No rash or itching.  CARDIOVASCULAR: per hpi RESPIRATORY: No shortness of breath, cough or sputum.  GASTROINTESTINAL: No anorexia, nausea, vomiting or diarrhea. No abdominal pain or blood.  GENITOURINARY: No burning on urination, no polyuria NEUROLOGICAL: No headache, dizziness, syncope, paralysis, ataxia, numbness or tingling in the extremities. No change in bowel or bladder control.  MUSCULOSKELETAL: No muscle, back pain, joint pain or stiffness.  LYMPHATICS: No enlarged nodes. No history of splenectomy.  PSYCHIATRIC: No history of depression or anxiety.  ENDOCRINOLOGIC: No reports of sweating, cold or heat intolerance. No polyuria or polydipsia.  Marland Kitchen   Physical Examination Today's Vitals   01/27/22 0847  BP: 110/60  Pulse: 85  SpO2: 94%  Weight: 281 lb 9.6 oz (127.7 kg)  Height: '5\' 5"'$  (1.651 m)   Body mass index is 46.86 kg/m.  Gen: resting comfortably, no acute distress HEENT: no scleral icterus, pupils equal round and reactive, no palptable cervical adenopathy,  CV: RRR, no m/r/g, no jvd Resp: Clear to auscultation bilaterally GI: abdomen is soft, non-tender, non-distended, normal bowel sounds, no hepatosplenomegaly MSK: extremities are warm, no edema.  Skin: warm, no rash Neuro:  no focal  deficits Psych: appropriate affect   Diagnostic Studies  06/2012 echo Study Conclusions   - Left ventricle: The cavity size was normal. Wall thickness    was normal. Systolic function was normal. The estimated    ejection fraction was in the range of 55% to 60%. Wall    motion was normal; there were no regional wall motion    abnormalities. Doppler parameters are consistent with    abnormal left ventricular relaxation (grade 1 diastolic    dysfunction). Doppler parameters are consistent with high    ventricular filling pressure.  - Mitral valve: Calcified annulus.  - Left atrium: The atrium was mildly dilated.  - Atrial septum: There was increased thickness of the    septum, consistent with lipomatous hypertrophy.      06/2012 cath  Findings:   Ao Pressure: 125/59 (83) LV Pressure:  137/11/20 There was no signficant gradient across the aortic valve on pullback.   Left main: Short. Normal   LAD: Large vessel with proximal 80% lesion. Mid vessel 30%. Distal vessel has diffuse moderate disease.    LCX: Large vessel gives off 3 OMs. 99% midsection followed by 40%. Distal vessel is small with 70% lesion.    RCA: Dominant. Normal.    LV-gram done in the RAO projection: Ejection fraction = 60%. No regional wall motion abnormalities.    Assessment: 1. 2V CAD 2. Normal LV function 3. NSTEMI   Plan/Discussion:   Plan PCI of LCX & LAD.     PCI Data: Vessel - proximal and mid left circumflex/Segment - proximal and mid Percent Stenosis (pre)  95% TIMI-flow 3 Stent 3.0 x 32 mm Promus drug-eluting stent postdilated with a 3.5 noncompliant balloon. Percent Stenosis (post) 0% TIMI-flow (post) 3     Vessel - LAD/Segment - proximal  Percent Stenosis (pre)  85% TIMI-flow 3 Stent 3.0 x 20 mm Promus drug-eluting stent postdilated with a 3.5 noncompliant balloon. Percent Stenosis (post) 0% TIMI-flow (post) 3   Final Conclusions:   Successful angioplasty and drug-eluting  stent placement to the proximal/mid left circumflex as well as proximal left anterior descending artery.    Recommendations:  Recommend dual antiplatelet therapy for at least one year. Aggressive treatment of risk factors is recommended.   01/2022 echo 1. Left ventricular ejection fraction, by estimation, is 65 to 70%. The  left ventricle has normal function. The left ventricle has no regional  wall motion abnormalities. Left ventricular diastolic parameters are  consistent with Grade I diastolic  dysfunction (impaired relaxation).   2. Right ventricular systolic function is mildly reduced. The right  ventricular size is moderately to severely enlarged. There is severely  elevated pulmonary artery systolic pressure.   3. Left atrial size was mildly dilated.   4. Right atrial size was mildly dilated.   5. The mitral valve is abnormal. No evidence of mitral valve  regurgitation. Mild mitral stenosis. Moderate mitral annular  calcification.   6. The tricuspid valve is abnormal. Tricuspid valve regurgitation is mild  to moderate.   7. The aortic valve has an indeterminant number of cusps. Aortic valve  regurgitation is not visualized. No aortic stenosis is present.   8. The inferior vena cava is dilated in size with >50% respiratory  variability, suggesting right atrial pressure of 8 mmHg.     Assessment and Plan   1.Pulmonary HTN - severe by recent echo with PAPS 80 with evidence of RV enlargement and dysfunction. New diagnosis for patient - unclear etiology. - plan for RHC - plan for OSA evaluation - once initial tests completed likely PFTs, VQ scan, perhaps autoimmune panel  2. CAD - recent significant DOE. Has not had chest pain.  - she has history of CAD with prior stents. Requires RHC as reported above, given her CAD history and DOE ask to evaluate coronaries as well   Plan for RHC/LHC next week    Shared Decision Making/Informed Consent The risks [stroke (1 in 1000),  death (1 in 1000), kidney failure [usually temporary] (1 in 500), bleeding (1 in 200), allergic reaction [possibly serious] (1 in 200)], benefits (diagnostic support and management of coronary artery disease) and alternatives of a cardiac catheterization were discussed in detail with Sandra Schneider and she is willing to proceed.       Arnoldo Lenis, M.D.

## 2022-01-27 ENCOUNTER — Ambulatory Visit: Payer: Medicare Other | Attending: Cardiology | Admitting: Cardiology

## 2022-01-27 ENCOUNTER — Other Ambulatory Visit: Payer: Self-pay | Admitting: Cardiology

## 2022-01-27 ENCOUNTER — Encounter: Payer: Self-pay | Admitting: Cardiology

## 2022-01-27 ENCOUNTER — Telehealth: Payer: Self-pay | Admitting: Cardiology

## 2022-01-27 ENCOUNTER — Encounter: Payer: Self-pay | Admitting: *Deleted

## 2022-01-27 VITALS — BP 110/60 | HR 85 | Ht 65.0 in | Wt 281.6 lb

## 2022-01-27 DIAGNOSIS — R0600 Dyspnea, unspecified: Secondary | ICD-10-CM

## 2022-01-27 DIAGNOSIS — G4733 Obstructive sleep apnea (adult) (pediatric): Secondary | ICD-10-CM | POA: Diagnosis not present

## 2022-01-27 DIAGNOSIS — Z01812 Encounter for preprocedural laboratory examination: Secondary | ICD-10-CM

## 2022-01-27 DIAGNOSIS — R0609 Other forms of dyspnea: Secondary | ICD-10-CM | POA: Diagnosis not present

## 2022-01-27 DIAGNOSIS — I10 Essential (primary) hypertension: Secondary | ICD-10-CM | POA: Diagnosis not present

## 2022-01-27 MED ORDER — SODIUM CHLORIDE 0.9% FLUSH: 3.0000 mL | Freq: Two times a day (BID) | INTRAVENOUS | Status: DC

## 2022-01-27 NOTE — Telephone Encounter (Signed)
PERCERT:       -  Right & Left heart cath - Tuesday, 9/26 - 9:00 - [facility]  -

## 2022-01-27 NOTE — Patient Instructions (Addendum)
Medication Instructions:  Continue all current medications.  Labwork: BMET, CBC - orders given today   Testing/Procedures: Your physician has requested that you have a cardiac catheterization. Cardiac catheterization is used to diagnose and/or treat various heart conditions. Doctors may recommend this procedure for a number of different reasons. The most common reason is to evaluate chest pain. Chest pain can be a symptom of coronary artery disease (CAD), and cardiac catheterization can show whether plaque is narrowing or blocking your heart's arteries. This procedure is also used to evaluate the valves, as well as measure the blood flow and oxygen levels in different parts of your heart. For further information please visit [email or web address]. Please follow instruction sheet, as given.   Follow-Up: 3 weeks   Any Other Special Instructions Will Be Listed Below (If Applicable). You have been referred to Pulmonology.   If you need a refill on your cardiac medications before your next appointment, please call your pharmacy.

## 2022-01-30 ENCOUNTER — Other Ambulatory Visit: Payer: Self-pay | Admitting: Cardiology

## 2022-02-01 ENCOUNTER — Other Ambulatory Visit (HOSPITAL_COMMUNITY)
Admission: RE | Admit: 2022-02-01 | Discharge: 2022-02-01 | Disposition: A | Discharge status: Home / Self Care | Payer: Medicare Other | Source: Ambulatory Visit | Attending: Cardiology | Admitting: Cardiology

## 2022-02-01 DIAGNOSIS — I10 Essential (primary) hypertension: Secondary | ICD-10-CM | POA: Insufficient documentation

## 2022-02-01 DIAGNOSIS — Z01812 Encounter for preprocedural laboratory examination: Secondary | ICD-10-CM | POA: Insufficient documentation

## 2022-02-01 LAB — BASIC METABOLIC PANEL
Anion gap: 9 (ref 5–15)
BUN: 27 mg/dL — ABNORMAL HIGH (ref 8–23)
CO2: 24 mmol/L (ref 22–32)
Calcium: 9.4 mg/dL (ref 8.9–10.3)
Chloride: 106 mmol/L (ref 98–111)
Creatinine, Ser: 0.96 mg/dL (ref 0.44–1.00)
GFR, Estimated: >60 mL/min (ref >60)
Glucose, Bld: 162 mg/dL — ABNORMAL HIGH (ref 70–99)
Potassium: 4.3 mmol/L (ref 3.5–5.1)
Sodium: 139 mmol/L (ref 135–145)

## 2022-02-01 LAB — CBC
HCT: 37.1 % (ref 36.0–46.0)
Hemoglobin: 11.8 g/dL — ABNORMAL LOW (ref 12.0–15.0)
MCH: 31.6 pg (ref 26.0–34.0)
MCHC: 31.8 g/dL (ref 30.0–36.0)
MCV: 99.2 fL (ref 80.0–100.0)
Platelets: 241 10*3/uL (ref 150–400)
RBC: 3.74 MIL/uL — ABNORMAL LOW (ref 3.87–5.11)
RDW: 14.6 % (ref 11.5–15.5)
WBC: 6.8 10*3/uL (ref 4.0–10.5)
nRBC: 0 % (ref 0.0–0.2)

## 2022-02-06 ENCOUNTER — Telehealth: Payer: Self-pay | Admitting: *Deleted

## 2022-02-06 NOTE — Telephone Encounter (Signed)
Cardiac Catheterization scheduled at [facility] for: Tuesday February 07, 2022 9 AM Arrival time and place: [facility] Main Entrance A at: 7 AM  Nothing to eat after midnight prior to procedure, clear liquids until 5 AM day of procedure.  Medication instructions: -Hold:  Glipizide-AM of procedure  Metformin-day of procedure and 48 post procedure   Chlorthalidone-AM of procedure  -Except hold medications usual morning medications can be taken with sips of water including aspirin 81 mg.  Confirmed patient has responsible adult to drive home post procedure and be with patient first 24 hours after arriving home.  Patient reports no new symptoms concerning for COVID-19 in the past 10 days.  Reviewed procedure instructions with patient.

## 2022-02-07 ENCOUNTER — Other Ambulatory Visit: Payer: Self-pay

## 2022-02-07 ENCOUNTER — Ambulatory Visit (HOSPITAL_COMMUNITY)
Admission: RE | Admit: 2022-02-07 | Discharge: 2022-02-07 | Disposition: A | Discharge status: Home / Self Care | Payer: Medicare Other | Attending: Interventional Cardiology | Admitting: Interventional Cardiology

## 2022-02-07 ENCOUNTER — Encounter (HOSPITAL_COMMUNITY)
Admission: RE | Disposition: A | Discharge status: Home / Self Care | Payer: Medicare Other | Source: Home / Self Care | Attending: Interventional Cardiology

## 2022-02-07 DIAGNOSIS — I2510 Atherosclerotic heart disease of native coronary artery without angina pectoris: Secondary | ICD-10-CM | POA: Insufficient documentation

## 2022-02-07 DIAGNOSIS — E785 Hyperlipidemia, unspecified: Secondary | ICD-10-CM | POA: Diagnosis not present

## 2022-02-07 DIAGNOSIS — G4733 Obstructive sleep apnea (adult) (pediatric): Secondary | ICD-10-CM | POA: Insufficient documentation

## 2022-02-07 DIAGNOSIS — R0600 Dyspnea, unspecified: Secondary | ICD-10-CM

## 2022-02-07 DIAGNOSIS — R0602 Shortness of breath: Secondary | ICD-10-CM | POA: Diagnosis not present

## 2022-02-07 DIAGNOSIS — I272 Pulmonary hypertension, unspecified: Secondary | ICD-10-CM | POA: Diagnosis present

## 2022-02-07 DIAGNOSIS — I10 Essential (primary) hypertension: Secondary | ICD-10-CM | POA: Insufficient documentation

## 2022-02-07 DIAGNOSIS — I8390 Asymptomatic varicose veins of unspecified lower extremity: Secondary | ICD-10-CM | POA: Diagnosis not present

## 2022-02-07 DIAGNOSIS — C50919 Malignant neoplasm of unspecified site of unspecified female breast: Secondary | ICD-10-CM | POA: Diagnosis not present

## 2022-02-07 DIAGNOSIS — Z955 Presence of coronary angioplasty implant and graft: Secondary | ICD-10-CM | POA: Diagnosis not present

## 2022-02-07 DIAGNOSIS — I252 Old myocardial infarction: Secondary | ICD-10-CM | POA: Diagnosis not present

## 2022-02-07 DIAGNOSIS — I2582 Chronic total occlusion of coronary artery: Secondary | ICD-10-CM | POA: Insufficient documentation

## 2022-02-07 DIAGNOSIS — R0609 Other forms of dyspnea: Secondary | ICD-10-CM

## 2022-02-07 DIAGNOSIS — Z79899 Other long term (current) drug therapy: Secondary | ICD-10-CM | POA: Insufficient documentation

## 2022-02-07 HISTORY — PX: RIGHT/LEFT HEART CATH AND CORONARY ANGIOGRAPHY: CATH118266

## 2022-02-07 LAB — POCT I-STAT EG7
Acid-base deficit: 1 mmol/L (ref 0.0–2.0)
Acid-base deficit: 2 mmol/L (ref 0.0–2.0)
Acid-base deficit: 5 mmol/L — ABNORMAL HIGH (ref 0.0–2.0)
Bicarbonate: 22.8 mmol/L (ref 20.0–28.0)
Bicarbonate: 23.9 mmol/L (ref 20.0–28.0)
Bicarbonate: 25.5 mmol/L (ref 20.0–28.0)
Calcium, Ion: 1.22 mmol/L (ref 1.15–1.40)
Calcium, Ion: 1.25 mmol/L (ref 1.15–1.40)
Calcium, Ion: 1.28 mmol/L (ref 1.15–1.40)
HCT: 36 % (ref 36.0–46.0)
HCT: 36 % (ref 36.0–46.0)
HCT: 36 % (ref 36.0–46.0)
Hemoglobin: 12.2 g/dL (ref 12.0–15.0)
Hemoglobin: 12.2 g/dL (ref 12.0–15.0)
Hemoglobin: 12.2 g/dL (ref 12.0–15.0)
O2 Saturation: 67 %
O2 Saturation: 70 %
O2 Saturation: 96 %
Potassium: 3.8 mmol/L (ref 3.5–5.1)
Potassium: 4 mmol/L (ref 3.5–5.1)
Potassium: 4 mmol/L (ref 3.5–5.1)
Sodium: 128 mmol/L — ABNORMAL LOW (ref 135–145)
Sodium: 136 mmol/L (ref 135–145)
Sodium: 137 mmol/L (ref 135–145)
TCO2: 24 mmol/L (ref 22–32)
TCO2: 25 mmol/L (ref 22–32)
TCO2: 27 mmol/L (ref 22–32)
pCO2, Ven: 41.7 mmHg — ABNORMAL LOW (ref 44–60)
pCO2, Ven: 51.9 mmHg (ref 44–60)
pCO2, Ven: 52.2 mmHg (ref 44–60)
pH, Ven: 7.249 — ABNORMAL LOW (ref 7.25–7.43)
pH, Ven: 7.299 (ref 7.25–7.43)
pH, Ven: 7.366 (ref 7.25–7.43)
pO2, Ven: 41 mmHg (ref 32–45)
pO2, Ven: 41 mmHg (ref 32–45)
pO2, Ven: 88 mmHg — ABNORMAL HIGH (ref 32–45)

## 2022-02-07 LAB — POCT ACTIVATED CLOTTING TIME
Activated Clotting Time: 215 seconds
Activated Clotting Time: 185 seconds

## 2022-02-07 LAB — GLUCOSE, CAPILLARY: Glucose-Capillary: 123 mg/dL — ABNORMAL HIGH (ref 70–99)

## 2022-02-07 SURGERY — RIGHT/LEFT HEART CATH AND CORONARY ANGIOGRAPHY
Anesthesia: LOCAL

## 2022-02-07 MED ORDER — SODIUM CHLORIDE 0.9% FLUSH: 3.0000 mL | INTRAVENOUS | Status: DC | PRN

## 2022-02-07 MED ORDER — HEPARIN SODIUM (PORCINE) 1000 UNIT/ML IJ SOLN
INTRAMUSCULAR | Status: AC
  Filled 2022-02-07: qty 10

## 2022-02-07 MED ORDER — FENTANYL CITRATE (PF) 100 MCG/2ML IJ SOLN
INTRAMUSCULAR | Status: AC
  Filled 2022-02-07: qty 2

## 2022-02-07 MED ORDER — VERAPAMIL HCL 2.5 MG/ML IV SOLN
INTRAVENOUS | Status: AC
  Filled 2022-02-07: qty 2

## 2022-02-07 MED ORDER — SODIUM CHLORIDE 0.9 % WEIGHT BASED INFUSION
3.0000 mL/kg/h | INTRAVENOUS | Status: AC
  Administered 2022-02-07: 3 mL/kg/h via INTRAVENOUS

## 2022-02-07 MED ORDER — MIDAZOLAM HCL 2 MG/2ML IJ SOLN
INTRAMUSCULAR | Status: DC | PRN
  Administered 2022-02-07 (×2): 1 mg via INTRAVENOUS

## 2022-02-07 MED ORDER — HYDRALAZINE HCL 20 MG/ML IJ SOLN: 10.0000 mg | INTRAMUSCULAR | Status: DC | PRN

## 2022-02-07 MED ORDER — HEPARIN SODIUM (PORCINE) 1000 UNIT/ML IJ SOLN
INTRAMUSCULAR | Status: DC | PRN
  Administered 2022-02-07: 6000 [IU] via INTRAVENOUS

## 2022-02-07 MED ORDER — IOHEXOL 350 MG/ML SOLN
INTRAVENOUS | Status: DC | PRN
  Administered 2022-02-07: 49 mL

## 2022-02-07 MED ORDER — HEPARIN (PORCINE) IN NACL 1000-0.9 UT/500ML-% IV SOLN
INTRAVENOUS | Status: DC | PRN
  Administered 2022-02-07 (×2): 500 mL

## 2022-02-07 MED ORDER — ASPIRIN 81 MG PO CHEW: 81.0000 mg | CHEWABLE_TABLET | ORAL | Status: DC

## 2022-02-07 MED ORDER — MIDAZOLAM HCL 2 MG/2ML IJ SOLN
INTRAMUSCULAR | Status: AC
  Filled 2022-02-07: qty 2

## 2022-02-07 MED ORDER — SODIUM CHLORIDE 0.9 % IV SOLN: 250.0000 mL | INTRAVENOUS | Status: DC | PRN

## 2022-02-07 MED ORDER — ACETAMINOPHEN 325 MG PO TABS: 650.0000 mg | ORAL_TABLET | ORAL | Status: DC | PRN

## 2022-02-07 MED ORDER — LABETALOL HCL 5 MG/ML IV SOLN: 10.0000 mg | INTRAVENOUS | Status: DC | PRN

## 2022-02-07 MED ORDER — ONDANSETRON HCL 4 MG/2ML IJ SOLN: 4.0000 mg | Freq: Four times a day (QID) | INTRAMUSCULAR | Status: DC | PRN

## 2022-02-07 MED ORDER — METFORMIN HCL 1000 MG PO TABS: 1000.0000 mg | ORAL_TABLET | Freq: Two times a day (BID) | ORAL | Status: DC

## 2022-02-07 MED ORDER — SODIUM CHLORIDE 0.9 % WEIGHT BASED INFUSION: 1.0000 mL/kg/h | INTRAVENOUS | Status: DC

## 2022-02-07 MED ORDER — VERAPAMIL HCL 2.5 MG/ML IV SOLN
INTRAVENOUS | Status: DC | PRN
  Administered 2022-02-07: 10 mL via INTRA_ARTERIAL

## 2022-02-07 MED ORDER — FENTANYL CITRATE (PF) 100 MCG/2ML IJ SOLN
INTRAMUSCULAR | Status: DC | PRN
  Administered 2022-02-07 (×2): 25 ug via INTRAVENOUS

## 2022-02-07 MED ORDER — LIDOCAINE HCL (PF) 1 % IJ SOLN
INTRAMUSCULAR | Status: DC | PRN
  Administered 2022-02-07: 2 mL
  Administered 2022-02-07: 10 mL
  Administered 2022-02-07: 2 mL

## 2022-02-07 MED ORDER — SODIUM CHLORIDE 0.9% FLUSH: 3.0000 mL | Freq: Two times a day (BID) | INTRAVENOUS | Status: DC

## 2022-02-07 SURGICAL SUPPLY — 14 items
BAND ZEPHYR COMPRESS 30 LONG (HEMOSTASIS) IMPLANT
CATH 5FR JL3.5 JR4 ANG PIG MP (CATHETERS) IMPLANT
CATH BALLN WEDGE 5F 110CM (CATHETERS) IMPLANT
CATH SWAN GANZ 7F STRAIGHT (CATHETERS) IMPLANT
GLIDESHEATH SLEND SS 6F .021 (SHEATH) IMPLANT
GUIDEWIRE INQWIRE 1.5J.035X260 (WIRE) IMPLANT
INQWIRE 1.5J .035X260CM (WIRE) ×1
KIT HEART LEFT (KITS) ×1 IMPLANT
KIT SYRINGE INJ CVI SPIKEX1 (MISCELLANEOUS) IMPLANT
PACK CARDIAC CATHETERIZATION (CUSTOM PROCEDURE TRAY) ×1 IMPLANT
SET ATX SIMPLICITY (MISCELLANEOUS) IMPLANT
SHEATH GLIDE SLENDER 4/5FR (SHEATH) IMPLANT
SHEATH PINNACLE 7F 10CM (SHEATH) IMPLANT
SHEATH PROBE COVER 6X72 (BAG) IMPLANT

## 2022-02-07 NOTE — Progress Notes (Signed)
Site area: Right groin a 7 french venous sheath was removed  Site Prior to Removal:  Level 0  Pressure Applied For 15 MINUTES    Bedrest Beginning at 1115am  Manual:   Yes.    Patient Status During Pull:  stable  Post Pull Groin Site:  Level 0  Post Pull Instructions Given:  Yes.    Post Pull Pulses Present:  Yes.    Dressing Applied:  Yes.    Comments:

## 2022-02-07 NOTE — Interval H&P Note (Signed)
Cath Lab Visit (complete for each Cath Lab visit)  Clinical Evaluation Leading to the Procedure:   ACS: No.  Non-ACS:    Anginal Classification: CCS III  Anti-ischemic medical therapy: Minimal Therapy (1 class of medications)  Non-Invasive Test Results: High-risk stress test findings: cardiac mortality >3%/year severe pulmonary HTN  Prior CABG: No previous CABG      History and Physical Interval Note:  02/07/2022 8:33 AM  Sandra Schneider  has presented today for surgery, with the diagnosis of shortness of breath, pulmonary htn.  The various methods of treatment have been discussed with the patient and family. After consideration of risks, benefits and other options for treatment, the patient has consented to  Procedure(s): RIGHT/LEFT HEART CATH AND CORONARY ANGIOGRAPHY (N/A) as a surgical intervention.  The patient's history has been reviewed, patient examined, no change in status, stable for surgery.  I have reviewed the patient's chart and labs.  Questions were answered to the patient's satisfaction.     Larae Grooms

## 2022-02-08 ENCOUNTER — Telehealth: Payer: Self-pay | Admitting: *Deleted

## 2022-02-08 ENCOUNTER — Encounter (HOSPITAL_COMMUNITY): Payer: Self-pay | Admitting: Interventional Cardiology

## 2022-02-08 DIAGNOSIS — R0602 Shortness of breath: Secondary | ICD-10-CM

## 2022-02-08 NOTE — Telephone Encounter (Signed)
Patient notified and verbalized understanding.  She agrees to doing these test.  Order placed & sent to [facility] for scheduling.

## 2022-02-08 NOTE — Telephone Encounter (Signed)
-----   Message from Arnoldo Lenis, MD sent at 02/07/2022 10:05 AM EDT ----- Cath showed higher pulmonary artery pressures, we need to figure out what is causing this. Can we order PFTs for her for shortness of breath, also needs VQ pulmonary scan for shortness of breath. Once we get tests results back likely will set her up with a pulmonary hypertension specialist   Zandra Abts MD

## 2022-02-12 ENCOUNTER — Other Ambulatory Visit: Payer: Self-pay | Admitting: Cardiology

## 2022-02-13 ENCOUNTER — Other Ambulatory Visit: Payer: Self-pay | Admitting: *Deleted

## 2022-02-13 ENCOUNTER — Telehealth: Payer: Self-pay | Admitting: Cardiology

## 2022-02-13 DIAGNOSIS — Z01818 Encounter for other preprocedural examination: Secondary | ICD-10-CM

## 2022-02-13 DIAGNOSIS — R0602 Shortness of breath: Secondary | ICD-10-CM

## 2022-02-13 NOTE — Telephone Encounter (Signed)
Checking percert on the following patient for testing scheduled at [facility].    VQ SCAN      02-15-2022

## 2022-02-14 ENCOUNTER — Telehealth: Payer: Self-pay | Admitting: Cardiology

## 2022-02-14 NOTE — Telephone Encounter (Signed)
Checking percert on the following patient for testing scheduled at [facility].      PFT's    03-21-2022

## 2022-02-14 NOTE — Telephone Encounter (Signed)
Patient states she has a test tomorrow and is not sure if she is supposed to stop any medications prior.

## 2022-02-14 NOTE — Telephone Encounter (Signed)
Patient notified that she should not stop any medications prior to PFT. Patient verbalized understanding and had no further questions or concerns at this time.

## 2022-02-15 ENCOUNTER — Encounter (HOSPITAL_COMMUNITY): Payer: Medicare Other

## 2022-02-15 ENCOUNTER — Telehealth: Payer: Self-pay | Admitting: Cardiology

## 2022-02-15 ENCOUNTER — Ambulatory Visit (HOSPITAL_COMMUNITY)
Admission: RE | Admit: 2022-02-15 | Discharge: 2022-02-15 | Disposition: A | Discharge status: Home / Self Care | Payer: Medicare Other | Source: Ambulatory Visit | Attending: Cardiology | Admitting: Cardiology

## 2022-02-15 DIAGNOSIS — R0602 Shortness of breath: Secondary | ICD-10-CM | POA: Diagnosis present

## 2022-02-15 DIAGNOSIS — Z01818 Encounter for other preprocedural examination: Secondary | ICD-10-CM | POA: Diagnosis present

## 2022-02-15 NOTE — Telephone Encounter (Signed)
Patient has been having SOB. Test she was ordered to have is still pending for approval with [facility]. She is eager to get back to the [facility] for water aerobics and, working out. She wants to know if she should hold out until after the test results are back?

## 2022-02-15 NOTE — Telephone Encounter (Signed)
Patient decided she would wait until after VQ scan to exercise

## 2022-02-16 ENCOUNTER — Telehealth: Payer: Self-pay | Admitting: Cardiology

## 2022-02-16 DIAGNOSIS — R911 Solitary pulmonary nodule: Secondary | ICD-10-CM

## 2022-02-16 NOTE — Telephone Encounter (Signed)
Call report on chest x-ray from [facility]. Will send to DOD   Enlargement of cardiac silhouette post coronary stenting.   Question 8 mm RIGHT apex lung nodule; CT chest recommended to exclude pulmonary nodule.   Aortic Atherosclerosis (ICD10-I70.0).

## 2022-02-16 NOTE — Telephone Encounter (Signed)
Would recomm CT chest without contrast to evaluate possible nodule in lung

## 2022-02-16 NOTE — Telephone Encounter (Signed)
Follow Up:      Sandra Schneider wants to know if you received the Call report on thi s patient today?

## 2022-02-17 ENCOUNTER — Encounter (HOSPITAL_COMMUNITY): Payer: Medicare Other

## 2022-02-17 NOTE — Telephone Encounter (Signed)
Order placed for CT Chest W/O Contrast for possible lung nodule

## 2022-02-17 NOTE — Telephone Encounter (Signed)
Spoke to pt who verbalized understanding. Will route to schedulers to schedule.

## 2022-02-24 ENCOUNTER — Ambulatory Visit (HOSPITAL_COMMUNITY)
Admission: RE | Admit: 2022-02-24 | Discharge: 2022-02-24 | Disposition: A | Discharge status: Home / Self Care | Payer: Medicare Other | Source: Ambulatory Visit | Attending: Cardiology | Admitting: Cardiology

## 2022-02-24 ENCOUNTER — Other Ambulatory Visit: Payer: Self-pay | Admitting: *Deleted

## 2022-02-24 ENCOUNTER — Other Ambulatory Visit: Payer: Self-pay | Admitting: Cardiology

## 2022-02-24 ENCOUNTER — Encounter (HOSPITAL_COMMUNITY)
Admission: RE | Admit: 2022-02-24 | Discharge: 2022-02-24 | Disposition: A | Discharge status: Home / Self Care | Payer: Medicare Other | Source: Ambulatory Visit | Attending: Cardiology | Admitting: Cardiology

## 2022-02-24 DIAGNOSIS — R0602 Shortness of breath: Secondary | ICD-10-CM

## 2022-02-24 MED ORDER — TECHNETIUM TO 99M ALBUMIN AGGREGATED
4.4000 | Freq: Once | INTRAVENOUS | Status: AC | PRN
  Administered 2022-02-24: 4.4 via INTRAVENOUS

## 2022-02-24 NOTE — Progress Notes (Signed)
c 

## 2022-02-28 ENCOUNTER — Telehealth: Payer: Self-pay | Admitting: *Deleted

## 2022-02-28 NOTE — Telephone Encounter (Signed)
Laurine Blazer, LPN  09/47/0962 83:66 AM EDT Back to Top    Notified, copy to pcp.

## 2022-02-28 NOTE — Telephone Encounter (Signed)
-----   Message from Arnoldo Lenis, MD sent at 02/27/2022  3:25 PM EDT ----- Normal VQ scan, no evidence of blood clots as the cause of her pulmonary HTN  J BrancH MD

## 2022-03-02 ENCOUNTER — Ambulatory Visit (HOSPITAL_COMMUNITY)
Admission: RE | Admit: 2022-03-02 | Discharge: 2022-03-02 | Disposition: A | Discharge status: Home / Self Care | Payer: Medicare Other | Source: Ambulatory Visit | Attending: Internal Medicine | Admitting: Internal Medicine

## 2022-03-02 DIAGNOSIS — R911 Solitary pulmonary nodule: Secondary | ICD-10-CM | POA: Diagnosis present

## 2022-03-06 ENCOUNTER — Telehealth: Payer: Self-pay | Admitting: Cardiology

## 2022-03-06 NOTE — Telephone Encounter (Signed)
Sandra Schneider from [facility] radiology calling with critical CT results

## 2022-03-06 NOTE — Telephone Encounter (Signed)
Fay Records, MD  03/03/2022  8:56 PM EDT     Patient followed by Zandra Abts  Will forward There are some nodules in thyroid. ALos a nodule in R uppper lobe Radiology recomm 1.  USN of thyroid   2.PET CT to further evaluate lung nodule

## 2022-03-07 ENCOUNTER — Telehealth: Payer: Self-pay | Admitting: *Deleted

## 2022-03-07 DIAGNOSIS — Z8639 Personal history of other endocrine, nutritional and metabolic disease: Secondary | ICD-10-CM

## 2022-03-07 DIAGNOSIS — R911 Solitary pulmonary nodule: Secondary | ICD-10-CM

## 2022-03-07 NOTE — Telephone Encounter (Signed)
-----   Message from Fay Records, MD sent at 03/03/2022  8:56 PM EDT ----- Patient followed by Zandra Abts  Will forward There are some nodules in thyroid. ALos a nodule in R uppper lobe Radiology recomm 1.  USN of thyroid   2.PET CT to further evaluate lung nodule

## 2022-03-08 ENCOUNTER — Ambulatory Visit: Payer: Medicare Other | Attending: Cardiology | Admitting: Cardiology

## 2022-03-08 ENCOUNTER — Encounter: Payer: Self-pay | Admitting: Cardiology

## 2022-03-08 VITALS — BP 134/62 | HR 60 | Ht 66.0 in | Wt 276.6 lb

## 2022-03-08 DIAGNOSIS — I272 Pulmonary hypertension, unspecified: Secondary | ICD-10-CM | POA: Diagnosis not present

## 2022-03-08 DIAGNOSIS — I2510 Atherosclerotic heart disease of native coronary artery without angina pectoris: Secondary | ICD-10-CM | POA: Diagnosis not present

## 2022-03-08 NOTE — Progress Notes (Signed)
Clinical Summary Sandra Schneider is a 66 y.o.female seen today for follow up of the following medical problems   1. CAD - history of NSTEMI 06/2012, received DES to prox LAD and DES to mid LCX - 06/2012 echo LVEF 55-60%, grade I DDx       - 01/2022 echo LVEF 65-70%, no WMAs, grade I dd, mild RV dysfunction, mod to severely enlarged RV, severe pulm HTN PASP 80, mild mitrla stenosis,  - recent DOE, denies any chest pains.      2. Pulmonary HTN - 01/2022 echo LVEF 65-70%, no WMAs, grade I dd, mild RV dysfunction, mod to severely enlarged RV, severe pulm HTN PASP 80, mild mitrla stenosis,      -reports recent DOE 25-50 feet over the last month - no chest pains. Some LE edema at times.  Some cough, wheezing which is chronic - no tobacco history but around 2nd hand smoke - +snoring, +apneic episodes per family, limited daytime somnolence - no history of blood clots.      01/2022 RHC/LHC:  No significant CAD. Mean PA 69, PCWP pulmonary capillary wedge pressure difficult to assess due to significant V waves, and difficulty wedging.LVEDP 16  02/2022 VQ scan: no PE CT chest: 0.9 cm apical RUL nodule, Nonspecific mild right paratracheal and right subcarinal lymphadenopathy. Primary bronchogenic malignancy with nodal metastatic disease not excluded. Suggest PET-CT for further characterization of these findings. PFTs pending OSA eval    2.Thyroid nodule - [place] ordered     Other medical issues not addressed this visit   3. HTN - compliant w/ meds       3. Hyperlipidemia - labs followed by pcp - she is on atorvastatin '40mg'$  daily   -upcoming labs with pcp      4. Breast cancer - recent lumpectomy - reports just being monitored at this time.    5. 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  Past Medical History:  Diagnosis Date   Coronary artery disease    Diabetes mellitus    GERD (gastroesophageal reflux  disease)    Hypertension    Myocardial abscess    Myocardial infarct ([name]) 06/24/2012     No Known Allergies   Current Outpatient Medications  Medication Sig Dispense Refill   amLODipine (NORVASC) 10 MG tablet Take 1 tablet by mouth once daily 90 tablet 0   Ascorbic Acid (VITAMIN C PO) Take 1 tablet by mouth daily.     aspirin EC 81 MG tablet Take 81 mg by mouth daily.     atorvastatin (LIPITOR) 40 MG tablet Take 1 tablet by mouth once daily 90 tablet 0   chlorthalidone (HYGROTON) 25 MG tablet Take 1 tablet by mouth once daily 90 tablet 0   Cholecalciferol (VITAMIN D) 50 MCG (2000 UT) CAPS Take 2,000 Units by mouth daily.     dorzolamide-timolol (COSOPT) 22.3-6.8 MG/ML ophthalmic solution Place 1 drop into both eyes 2 (two) times daily.     famotidine (PEPCID) 20 MG tablet Take 20 mg by mouth daily.     glipiZIDE (GLUCOTROL) 10 MG tablet Take 10 mg by mouth 2 (two) times daily before a meal.     letrozole (FEMARA) 2.5 MG tablet Take 2.5 mg by mouth daily.     lisinopril (ZESTRIL) 40 MG tablet Take 1 tablet by mouth once daily 90 tablet 0   loratadine (CLARITIN) 10 MG tablet Take 10 mg by mouth  daily.     metFORMIN (GLUCOPHAGE) 1000 MG tablet Take 1 tablet (1,000 mg total) by mouth 2 (two) times daily.     metoprolol tartrate (LOPRESSOR) 25 MG tablet Take 37.5 mg by mouth 2 (two) times daily.     Multiple Vitamin (MULTIVITAMIN) tablet Take 1 tablet by mouth daily.     Current Facility-Administered Medications  Medication Dose Route Frequency Provider Last Rate Last Admin   sodium chloride flush (NS) 0.9 % injection 3 mL  3 mL Intravenous Q12H Arnoldo Lenis, MD         Past Surgical History:  Procedure Laterality Date   ANTERIOR VITRECTOMY Left 07/21/2019   Procedure: ANTERIOR VITRECTOMY;  Surgeon: Baruch Goldmann, MD;  Location: [facility];  Service: Ophthalmology;  Laterality: Left;   ANTERIOR VITRECTOMY Right 08/04/2019   Procedure: ANTERIOR VITRECTOMY;  Surgeon: Baruch Goldmann,  MD;  Location: [facility];  Service: Ophthalmology;  Laterality: Right;   CARDIAC CATHETERIZATION     CATARACT EXTRACTION W/PHACO Left 07/21/2019   Procedure: CATARACT EXTRACTION PHACO AND INTRAOCULAR LENS PLACEMENT (IOC) (CDE: 11.45);  Surgeon: Baruch Goldmann, MD;  Location: [facility];  Service: Ophthalmology;  Laterality: Left;   CATARACT EXTRACTION W/PHACO Right 08/04/2019   Procedure: CATARACT EXTRACTION PHACO AND INTRAOCULAR LENS PLACEMENT (IOC);  Surgeon: Baruch Goldmann, MD;  Location: [facility];  Service: Ophthalmology;  Laterality: Right;  CDE: 9.78   CESAREAN SECTION     CORONARY STENT PLACEMENT N/A 06/24/2012   LEFT HEART CATHETERIZATION WITH CORONARY ANGIOGRAM N/A 06/24/2012   Procedure: LEFT HEART CATHETERIZATION WITH CORONARY ANGIOGRAM;  Surgeon: Jolaine Artist, MD;  Location: [facility] CATH LAB;  Service: Cardiovascular;  Laterality: N/A;   RIGHT/LEFT HEART CATH AND CORONARY ANGIOGRAPHY N/A 02/07/2022   Procedure: RIGHT/LEFT HEART CATH AND CORONARY ANGIOGRAPHY;  Surgeon: Jettie Booze, MD;  Location: [place] CV LAB;  Service: Cardiovascular;  Laterality: N/A;     No Known Allergies    Family History  Problem Relation Age of Onset   Heart disease Father    Diabetes Other    Hypertension Other    Cancer Other      Social History Sandra Schneider reports that she has never smoked. She has never been exposed to tobacco smoke. She has never used smokeless tobacco. Sandra Schneider reports no history of alcohol use.   Review of Systems CONSTITUTIONAL: No weight loss, fever, chills, weakness or fatigue.  HEENT: Eyes: No visual loss, blurred vision, double vision or yellow sclerae.No hearing loss, sneezing, congestion, runny nose or sore throat.  SKIN: No rash or itching.  CARDIOVASCULAR: per hpi RESPIRATORY: per hpi GASTROINTESTINAL: No anorexia, nausea, vomiting or diarrhea. No abdominal pain or blood.  GENITOURINARY: No burning on urination, no polyuria NEUROLOGICAL: No  headache, dizziness, syncope, paralysis, ataxia, numbness or tingling in the extremities. No change in bowel or bladder control.  MUSCULOSKELETAL: No muscle, back pain, joint pain or stiffness.  LYMPHATICS: No enlarged nodes. No history of splenectomy.  PSYCHIATRIC: No history of depression or anxiety.  ENDOCRINOLOGIC: No reports of sweating, cold or heat intolerance. No polyuria or polydipsia.  Marland Kitchen   Physical Examination Today's Vitals   03/08/22 1203  BP: 134/62  Pulse: 60  SpO2: 90%  Weight: 276 lb 9.6 oz (125.5 kg)  Height: '5\' 6"'$  (1.676 m)   Body mass index is 44.64 kg/m.  Gen: resting comfortably, no acute distress HEENT: no scleral icterus, pupils equal round and reactive, no palptable cervical adenopathy,  CV: RRR, no m/rg, no jvd Resp:  Clear to auscultation bilaterally GI: abdomen is soft, non-tender, non-distended, normal bowel sounds, no hepatosplenomegaly MSK: extremities are warm, no edema.  Skin: warm, no rash Neuro:  no focal deficits Psych: appropriate affect   Diagnostic Studies  06/2012 echo Study Conclusions   - Left ventricle: The cavity size was normal. Wall thickness    was normal. Systolic function was normal. The estimated    ejection fraction was in the range of 55% to 60%. Wall    motion was normal; there were no regional wall motion    abnormalities. Doppler parameters are consistent with    abnormal left ventricular relaxation (grade 1 diastolic    dysfunction). Doppler parameters are consistent with high    ventricular filling pressure.  - Mitral valve: Calcified annulus.  - Left atrium: The atrium was mildly dilated.  - Atrial septum: There was increased thickness of the    septum, consistent with lipomatous hypertrophy.      06/2012 cath     Findings:   Ao Pressure: 125/59 (83) LV Pressure:  137/11/20 There was no signficant gradient across the aortic valve on pullback.   Left main: Short. Normal   LAD: Large vessel with proximal  80% lesion. Mid vessel 30%. Distal vessel has diffuse moderate disease.    LCX: Large vessel gives off 3 OMs. 99% midsection followed by 40%. Distal vessel is small with 70% lesion.    RCA: Dominant. Normal.    LV-gram done in the RAO projection: Ejection fraction = 60%. No regional wall motion abnormalities.    Assessment: 1. 2V CAD 2. Normal LV function 3. NSTEMI   Plan/Discussion:   Plan PCI of LCX & LAD.     PCI Data: Vessel - proximal and mid left circumflex/Segment - proximal and mid Percent Stenosis (pre)  95% TIMI-flow 3 Stent 3.0 x 32 mm Promus drug-eluting stent postdilated with a 3.5 noncompliant balloon. Percent Stenosis (post) 0% TIMI-flow (post) 3     Vessel - LAD/Segment - proximal  Percent Stenosis (pre)  85% TIMI-flow 3 Stent 3.0 x 20 mm Promus drug-eluting stent postdilated with a 3.5 noncompliant balloon. Percent Stenosis (post) 0% TIMI-flow (post) 3   Final Conclusions:   Successful angioplasty and drug-eluting stent placement to the proximal/mid left circumflex as well as proximal left anterior descending artery.    Recommendations:  Recommend dual antiplatelet therapy for at least one year. Aggressive treatment of risk factors is recommended.     01/2022 echo 1. Left ventricular ejection fraction, by estimation, is 65 to 70%. The  left ventricle has normal function. The left ventricle has no regional  wall motion abnormalities. Left ventricular diastolic parameters are  consistent with Grade I diastolic  dysfunction (impaired relaxation).   2. Right ventricular systolic function is mildly reduced. The right  ventricular size is moderately to severely enlarged. There is severely  elevated pulmonary artery systolic pressure.   3. Left atrial size was mildly dilated.   4. Right atrial size was mildly dilated.   5. The mitral valve is abnormal. No evidence of mitral valve  regurgitation. Mild mitral stenosis. Moderate mitral annular   calcification.   6. The tricuspid valve is abnormal. Tricuspid valve regurgitation is mild  to moderate.   7. The aortic valve has an indeterminant number of cusps. Aortic valve  regurgitation is not visualized. No aortic stenosis is present.   8. The inferior vena cava is dilated in size with >50% respiratory  variability, suggesting right atrial pressure of 8 mmHg.  01/2022 RHC/LHC  Prox Cx to Dist Cx lesion is 25% stenosed.   Mid LAD lesion is 25% stenosed.   2nd Mrg lesion is 100% stenosed.  Right to left collaterals.  Left to left collaterals.   The left ventricular systolic function is normal.   LV end diastolic pressure is mildly elevated.   The left ventricular ejection fraction is greater than 65% by visual estimate.   Hemodynamic findings consistent with severe pulmonary hypertension.   There is no aortic valve stenosis.   Aortic saturation 96% on 3 L nasal cannula, PA saturation 69% on 3 L, mean RA pressure 32 mm Hg; PA pressure 107/48, mean PA pressure 69 mmHg, pulmonary capillary wedge pressure difficult to assess due to significant V waves, and difficulty wedging.  Cardiac output 7.03 L/min, cardiac index 3.07.   Patent stents with mild restenosis in the LAD and circumflex.  Small obtuse marginal appears occluded distally with some right to left collaterals.  No target for PCI.   Most likely her symptoms are coming from her severe pulmonary artery hypertension.  She states she will be starting CPAP therapy for obstructive sleep apnea soon.     Assessment and Plan   1.Pulmonary HTN - severe by recent echo with PASP 80 with evidence of RV enlargement and dysfunction. New diagnosis for patient - unclear etiology. -refer to CHF clinic   2. CAD - rrecent cath with patent stents - continue current meds       Arnoldo Lenis, M.D.

## 2022-03-08 NOTE — Patient Instructions (Signed)
Medication Instructions:  Your physician recommends that you continue on your current medications as directed. Please refer to the Current Medication list given to you today.  *If you need a refill on your cardiac medications before your next appointment, please call your pharmacy*   Lab Work: NONE   If you have labs (blood work) drawn today and your tests are completely normal, you will receive your results only by: [place] (if you have MyChart) OR A paper copy in the mail If you have any lab test that is abnormal or we need to change your treatment, we will call you to review the results.   Testing/Procedures: NONE    Follow-Up: At [facility], you and your health needs are our priority.  As part of our continuing mission to provide you with exceptional heart care, we have created designated Provider Care Teams.  These Care Teams include your primary Cardiologist (physician) and Advanced Practice Providers (APPs -  Physician Assistants and Nurse Practitioners) who all work together to provide you with the care you need, when you need it.  We recommend signing up for the patient portal called "MyChart".  Sign up information is provided on this After Visit Summary.  MyChart is used to connect with patients for Virtual Visits (Telemedicine).  Patients are able to view lab/test results, encounter notes, upcoming appointments, etc.  Non-urgent messages can be sent to your provider as well.   To learn more about what you can do with MyChart, go to [email or web address].    Your next appointment:   4 month(s)  The format for your next appointment:   In Person  Provider:   Carlyle Dolly, MD    Other Instructions Thank you for choosing [facility]!    Important Information About Sugar

## 2022-03-15 ENCOUNTER — Institutional Professional Consult (permissible substitution): Payer: Medicare Other | Admitting: Pulmonary Disease

## 2022-03-21 ENCOUNTER — Ambulatory Visit (HOSPITAL_COMMUNITY)
Admission: RE | Admit: 2022-03-21 | Discharge: 2022-03-21 | Disposition: A | Discharge status: Home / Self Care | Payer: Medicare Other | Source: Ambulatory Visit | Attending: Cardiology | Admitting: Cardiology

## 2022-03-21 DIAGNOSIS — R0602 Shortness of breath: Secondary | ICD-10-CM | POA: Diagnosis present

## 2022-03-21 LAB — PULMONARY FUNCTION TEST
DL/VA % pred: 100 %
DL/VA: 4.13 ml/min/mmHg/L
DLCO unc % pred: 64 %
DLCO unc: 13.61 ml/min/mmHg
FEF 25-75 Post: 2.08 L/sec
FEF 25-75 Pre: 2.32 L/sec
FEF2575-%Change-Post: -10 %
FEF2575-%Pred-Post: 96 %
FEF2575-%Pred-Pre: 107 %
FEV1-%Change-Post: 0 %
FEV1-%Pred-Post: 65 %
FEV1-%Pred-Pre: 65 %
FEV1-Post: 1.67 L
FEV1-Pre: 1.68 L
FEV1FVC-%Change-Post: 2 %
FEV1FVC-%Pred-Pre: 109 %
FEV6-%Change-Post: -2 %
FEV6-%Pred-Post: 60 %
FEV6-%Pred-Pre: 62 %
FEV6-Post: 1.95 L
FEV6-Pre: 2 L
FEV6FVC-%Pred-Post: 104 %
FEV6FVC-%Pred-Pre: 104 %
FVC-%Change-Post: -2 %
FVC-%Pred-Post: 58 %
FVC-%Pred-Pre: 59 %
FVC-Post: 1.95 L
FVC-Pre: 2 L
Post FEV1/FVC ratio: 86 %
Post FEV6/FVC ratio: 100 %
Pre FEV1/FVC ratio: 84 %
Pre FEV6/FVC Ratio: 100 %
RV % pred: 91 %
RV: 2.04 L
TLC % pred: 74 %
TLC: 4.01 L

## 2022-03-21 MED ORDER — ALBUTEROL SULFATE (2.5 MG/3ML) 0.083% IN NEBU
2.5000 mg | INHALATION_SOLUTION | Freq: Once | RESPIRATORY_TRACT | Status: AC
  Administered 2022-03-21: 2.5 mg via RESPIRATORY_TRACT

## 2022-03-23 ENCOUNTER — Other Ambulatory Visit: Payer: Self-pay

## 2022-03-30 ENCOUNTER — Encounter (HOSPITAL_COMMUNITY)
Admission: RE | Admit: 2022-03-30 | Discharge: 2022-03-30 | Disposition: A | Discharge status: Home / Self Care | Payer: Medicare Other | Source: Ambulatory Visit | Attending: Internal Medicine | Admitting: Internal Medicine

## 2022-03-30 DIAGNOSIS — R911 Solitary pulmonary nodule: Secondary | ICD-10-CM | POA: Diagnosis present

## 2022-03-30 MED ORDER — FLUDEOXYGLUCOSE F - 18 (FDG) INJECTION
15.1700 | Freq: Once | INTRAVENOUS | Status: AC | PRN
  Administered 2022-03-30: 15.17 via INTRAVENOUS

## 2022-03-31 ENCOUNTER — Institutional Professional Consult (permissible substitution): Payer: Medicare Other | Admitting: Pulmonary Disease

## 2022-04-03 ENCOUNTER — Telehealth: Payer: Self-pay

## 2022-04-03 DIAGNOSIS — K6289 Other specified diseases of anus and rectum: Secondary | ICD-10-CM

## 2022-04-03 NOTE — Telephone Encounter (Signed)
Patient notified and verbalized understanding. Patient aware of GI referral and will await appointment.

## 2022-04-03 NOTE — Telephone Encounter (Signed)
-----   Message from Arnoldo Lenis, MD sent at 04/03/2022  1:22 PM EST ----- PET scan shows the lung nodule does not have worrisome findings. The known area of her breast cancer showed some abnormality which would be expected but also there was an area that looked abnormal in the rectum. Please refer her to GI for rectal mass/growth on PET scan. She should make her breast cancer doctors aware of the PET at her next f/u as well so they can review   Zandra Abts MD

## 2022-04-04 ENCOUNTER — Encounter: Payer: Self-pay | Admitting: Gastroenterology

## 2022-04-10 ENCOUNTER — Telehealth: Payer: Self-pay | Admitting: *Deleted

## 2022-04-10 DIAGNOSIS — I272 Pulmonary hypertension, unspecified: Secondary | ICD-10-CM

## 2022-04-10 NOTE — Telephone Encounter (Signed)
Laurine Blazer, LPN 54/36/0677  0:34 PM EST Back to Top    Notified, copy to pcp.  She agrees to referral.     Laurine Blazer, LPN 03/52/4818  5:90 PM EST     Left message to return call.   Arnoldo Lenis, MD 03/28/2022  2:37 PM EST     Breathing tests show that her weight does affect her lungs ability to fully function, something calle restrictive lung disease. Main treatment is working toward weight loss. Can we go ahead and refer her to heart failure clnic please for severe pulmonary hypertension   Zandra Abts MD

## 2022-04-27 ENCOUNTER — Other Ambulatory Visit: Payer: Self-pay | Admitting: Cardiology

## 2022-05-02 ENCOUNTER — Telehealth: Payer: Self-pay | Admitting: Internal Medicine

## 2022-05-02 NOTE — Telephone Encounter (Signed)
Pt was returning call to schedule for thyroid test. Requesting return call.

## 2022-05-02 NOTE — Telephone Encounter (Signed)
Spoke with pt who states that she made an appt for 05/10/30 at 10:30 am.

## 2022-05-10 ENCOUNTER — Ambulatory Visit (HOSPITAL_COMMUNITY)
Admission: RE | Admit: 2022-05-10 | Discharge: 2022-05-10 | Disposition: A | Discharge status: Home / Self Care | Payer: Medicare Other | Source: Ambulatory Visit | Attending: Internal Medicine | Admitting: Internal Medicine

## 2022-05-10 DIAGNOSIS — Z8639 Personal history of other endocrine, nutritional and metabolic disease: Secondary | ICD-10-CM | POA: Insufficient documentation

## 2022-05-16 ENCOUNTER — Encounter: Payer: Self-pay | Admitting: *Deleted

## 2022-05-16 ENCOUNTER — Telehealth: Payer: Self-pay | Admitting: *Deleted

## 2022-05-16 ENCOUNTER — Encounter: Payer: Self-pay | Admitting: Gastroenterology

## 2022-05-16 ENCOUNTER — Ambulatory Visit (INDEPENDENT_AMBULATORY_CARE_PROVIDER_SITE_OTHER): Payer: Medicare Other | Admitting: Gastroenterology

## 2022-05-16 ENCOUNTER — Telehealth: Payer: Self-pay | Admitting: Cardiology

## 2022-05-16 VITALS — BP 166/82 | HR 63 | Temp 97.2°F | Ht 65.5 in | Wt 282.8 lb

## 2022-05-16 DIAGNOSIS — K6289 Other specified diseases of anus and rectum: Secondary | ICD-10-CM | POA: Diagnosis not present

## 2022-05-16 MED ORDER — PEG 3350-KCL-NA BICARB-NACL 420 G PO SOLR: 4000.0000 mL | Freq: Once | ORAL | 0 refills | Status: AC

## 2022-05-16 NOTE — Patient Instructions (Signed)
We are arranging a colonoscopy with Dr. Abbey Chatters in the near future.   Do not take glipizide or metformin the morning of the procedure.  It is important to get back on your blood pressure medications (which I know you will be doing!)  Further recommendations to follow!   It was a pleasure to see you today. I want to create trusting relationships with patients to provide genuine, compassionate, and quality care. I value your feedback. If you receive a survey regarding your visit,  I greatly appreciate you taking time to fill this out.   Annitta Needs, PhD, ANP-BC [facility]

## 2022-05-16 NOTE — Telephone Encounter (Signed)
Thyroid US discussed with patient, placed in recall,copied pcp

## 2022-05-16 NOTE — Telephone Encounter (Signed)
[employer] PA:  APPROVED  Authorization #: [id number]  DOS: 05/22/22-08/20/22

## 2022-05-16 NOTE — Progress Notes (Signed)
Gastroenterology Office Note    Referring Provider: Arnoldo Lenis, MD Primary Care Physician:  Alliance, [place]  Primary GI: Dr. Abbey Chatters    Chief Complaint   Chief Complaint  Patient presents with   New Patient (Initial Visit)    Pt being seen for rectal mass/ pt has not had any of her medications since day before yesterday     History of Present Illness   Sandra Schneider is a 67 y.o. female presenting today at the request of Dr. Harl Bowie due to concern for rectal mass on PET Ct Nov 2023.  Her last colonoscopy was by Dr. Loyola Mast April 2022: findings of two 3 mm cecal polyps and sigmoid diverticulosis. I do not have path available.  Right breast lumpectomy in July 2021. Right long nodule on CT chest in Oct 2023 and PET scan had been suggested. PET-CT then revealed 2.3 cm hypermetabolic polypoid soft tissue density in the lower rectum, suspicious for rectal carcinoma. In addition, pulmonary nodule suggested benign etiology, and there was a 2.8 cm irregular soft tissue density in right lateral breast with central clips, possibly due to recent treatment.   No abdominal pain, N/V, changes in bowel habits, constipation, diarrhea, overt GI bleeding, refractory GERD, dysphagia, unexplained weight loss, lack of appetite, unexplained weight gain.    Son was actually diagnosed with colon cancer at age 73, many years ago.    Past Medical History:  Diagnosis Date   Breast cancer ([place])    Coronary artery disease    Diabetes mellitus    GERD (gastroesophageal reflux disease)    Hypertension    Myocardial abscess    Myocardial infarct ([place]) 06/24/2012   Pulmonary hypertension ([place])     Past Surgical History:  Procedure Laterality Date   ANTERIOR VITRECTOMY Left 07/21/2019   Procedure: ANTERIOR VITRECTOMY;  Surgeon: Baruch Goldmann, MD;  Location: [facility];  Service: Ophthalmology;  Laterality: Left;   ANTERIOR VITRECTOMY Right 08/04/2019   Procedure:  ANTERIOR VITRECTOMY;  Surgeon: Baruch Goldmann, MD;  Location: [facility];  Service: Ophthalmology;  Laterality: Right;   CARDIAC CATHETERIZATION     CATARACT EXTRACTION W/PHACO Left 07/21/2019   Procedure: CATARACT EXTRACTION PHACO AND INTRAOCULAR LENS PLACEMENT (IOC) (CDE: 11.45);  Surgeon: Baruch Goldmann, MD;  Location: [facility];  Service: Ophthalmology;  Laterality: Left;   CATARACT EXTRACTION W/PHACO Right 08/04/2019   Procedure: CATARACT EXTRACTION PHACO AND INTRAOCULAR LENS PLACEMENT (IOC);  Surgeon: Baruch Goldmann, MD;  Location: [facility];  Service: Ophthalmology;  Laterality: Right;  CDE: 9.78   CESAREAN SECTION     CORONARY STENT PLACEMENT N/A 06/24/2012   LEFT HEART CATHETERIZATION WITH CORONARY ANGIOGRAM N/A 06/24/2012   Procedure: LEFT HEART CATHETERIZATION WITH CORONARY ANGIOGRAM;  Surgeon: Jolaine Artist, MD;  Location: [facility] CATH LAB;  Service: Cardiovascular;  Laterality: N/A;   right breast lumpectomy     RIGHT/LEFT HEART CATH AND CORONARY ANGIOGRAPHY N/A 02/07/2022   Procedure: RIGHT/LEFT HEART CATH AND CORONARY ANGIOGRAPHY;  Surgeon: Jettie Booze, MD;  Location: [facility] CV LAB;  Service: Cardiovascular;  Laterality: N/A;    Current Outpatient Medications  Medication Sig Dispense Refill   amLODipine (NORVASC) 10 MG tablet Take 1 tablet by mouth once daily 90 tablet 0   Ascorbic Acid (VITAMIN C PO) Take 1 tablet by mouth daily.     aspirin EC 81 MG tablet Take 81 mg by mouth daily.     atorvastatin (LIPITOR) 40 MG tablet Take 1 tablet by mouth once  daily 90 tablet 0   chlorthalidone (HYGROTON) 25 MG tablet Take 1 tablet by mouth once daily 90 tablet 0   Cholecalciferol (VITAMIN D) 50 MCG (2000 UT) CAPS Take 2,000 Units by mouth daily.     dorzolamide-timolol (COSOPT) 22.3-6.8 MG/ML ophthalmic solution Place 1 drop into both eyes 2 (two) times daily.     glipiZIDE (GLUCOTROL) 10 MG tablet Take 10 mg by mouth 2 (two) times daily before a meal.     letrozole (FEMARA) 2.5  MG tablet Take 2.5 mg by mouth daily.     lisinopril (ZESTRIL) 40 MG tablet Take 1 tablet by mouth once daily 90 tablet 3   loratadine (CLARITIN) 10 MG tablet Take 10 mg by mouth daily.     metFORMIN (GLUCOPHAGE) 1000 MG tablet Take 1 tablet (1,000 mg total) by mouth 2 (two) times daily.     Metoprolol Tartrate 37.5 MG TABS Take 37.5 mg by mouth 2 (two) times daily.     Multiple Vitamin (MULTIVITAMIN) tablet Take 1 tablet by mouth daily.     famotidine (PEPCID) 20 MG tablet Take 20 mg by mouth daily.     Current Facility-Administered Medications  Medication Dose Route Frequency Provider Last Rate Last Admin   sodium chloride flush (NS) 0.9 % injection 3 mL  3 mL Intravenous Q12H Arnoldo Lenis, MD        Allergies as of 05/16/2022   (No Known Allergies)    Family History  Problem Relation Age of Onset   Heart disease Father    Diabetes Other    Hypertension Other    Cancer Other    Colon cancer Son 22       alive, doing well.    Social History   Socioeconomic History   Marital status: Widowed    Spouse name: Not on file   Number of children: Not on file   Years of education: Not on file   Highest education level: Not on file  Occupational History   Not on file  Tobacco Use   Smoking status: Never    Passive exposure: Never   Smokeless tobacco: Never  Vaping Use   Vaping Use: Never used  Substance and Sexual Activity   Alcohol use: No   Drug use: No   Sexual activity: Yes    Birth control/protection: Post-menopausal  Other Topics Concern   Not on file  Social History Narrative   Not on file   Social Determinants of Health   Financial Resource Strain: Not on file  Food Insecurity: Not on file  Transportation Needs: Not on file  Physical Activity: Not on file  Stress: Not on file  Social Connections: Not on file  Intimate Partner Violence: Not on file     Review of Systems   Gen: Denies any fever, chills, fatigue, weight loss, lack of appetite.   CV: Denies chest pain, heart palpitations, peripheral edema, syncope.  Resp: Denies shortness of breath at rest or with exertion. Denies wheezing or cough.  GI: Denies dysphagia or odynophagia. Denies jaundice, hematemesis, fecal incontinence. GU : Denies urinary burning, urinary frequency, urinary hesitancy MS: Denies joint pain, muscle weakness, cramps, or limitation of movement.  Derm: Denies rash, itching, dry skin Psych: Denies depression, anxiety, memory loss, and confusion Heme: Denies bruising, bleeding, and enlarged lymph nodes.   Physical Exam   BP (!) 166/82   Pulse 63   Temp (!) 97.2 F (36.2 C)   Ht 5' 5.5" (1.664 m)  Wt 282 lb 12.8 oz (128.3 kg)   LMP 01/14/2011   BMI 46.34 kg/m  General:   Alert and oriented. Pleasant and cooperative. Well-nourished and well-developed.  Head:  Normocephalic and atraumatic. Eyes:  Without icterus Ears:  Normal auditory acuity. Lungs:  Clear to auscultation bilaterally.  Heart:  S1, S2 present without murmurs appreciated.  Abdomen:  +BS, soft, non-tender and non-distended. No HSM noted. No guarding or rebound. No masses appreciated.  Rectal:  no obvious mass appreciated. Small amount of soft stool in rectal vault Msk:  Symmetrical without gross deformities. Normal posture. Extremities:  Without edema. Neurologic:  Alert and  oriented x4;  grossly normal neurologically. Skin:  Intact without significant lesions or rashes. Psych:  Alert and cooperative. Normal mood and affect.   Assessment   Sandra Schneider is a 67 y.o. female presenting today at the request of Dr. Harl Bowie due to concern for rectal mass on PET CT Nov 2023.   Recent colonoscopy by Dr. Loyola Mast in April 2022 with two 3 mm cecal polyps, but I do not have access to path. Interesting, she notes her son has history of colon cancer and diagnosed at age 68.  She has no concerning upper or lower GI signs/symptoms. Rectal exam today somewhat difficult with stool in  rectal vault but no glaring mass able to be palpated.     Expeditious colonoscopy to be arranged.    PLAN   Proceed with colonoscopy by Dr. Abbey Chatters  in near future: the risks, benefits, and alternatives have been discussed with the patient in detail. The patient states understanding and desires to proceed.   Further recommendations to follow   Annitta Needs, PhD, ANP-BC [facility]

## 2022-05-16 NOTE — Telephone Encounter (Signed)
Patient returned RN's call and requested call back to home phone - [phone] .

## 2022-05-16 NOTE — Telephone Encounter (Signed)
Pt informed of pre-op visit scheduled for Friday 05/19/22 at 1:15 pm at [facility]. Verbalized understanding.

## 2022-05-16 NOTE — H&P (View-Only) (Signed)
Gastroenterology Office Note    Referring Provider: Arnoldo Lenis, MD Primary Care Physician:  [facility], [place]  Primary GI: Dr. Abbey Chatters    Chief Complaint   Chief Complaint  Patient presents with   New Patient (Initial Visit)    Pt being seen for rectal mass/ pt has not had any of her medications since day before yesterday     History of Present Illness   Sandra Schneider is a 67 y.o. female presenting today at the request of Dr. Harl Bowie due to concern for rectal mass on PET Ct Nov 2023.  Her last colonoscopy was by Dr. Loyola Mast April 2022: findings of two 3 mm cecal polyps and sigmoid diverticulosis. I do not have path available.  Right breast lumpectomy in July 2021. Right long nodule on CT chest in Oct 2023 and PET scan had been suggested. PET-CT then revealed 2.3 cm hypermetabolic polypoid soft tissue density in the lower rectum, suspicious for rectal carcinoma. In addition, pulmonary nodule suggested benign etiology, and there was a 2.8 cm irregular soft tissue density in right lateral breast with central clips, possibly due to recent treatment.   No abdominal pain, N/V, changes in bowel habits, constipation, diarrhea, overt GI bleeding, refractory GERD, dysphagia, unexplained weight loss, lack of appetite, unexplained weight gain.    Son was actually diagnosed with colon cancer at age 79, many years ago.    Past Medical History:  Diagnosis Date   Breast cancer ([place])    Coronary artery disease    Diabetes mellitus    GERD (gastroesophageal reflux disease)    Hypertension    Myocardial abscess    Myocardial infarct ([place]) 06/24/2012   Pulmonary hypertension ([place])     Past Surgical History:  Procedure Laterality Date   ANTERIOR VITRECTOMY Left 07/21/2019   Procedure: ANTERIOR VITRECTOMY;  Surgeon: Baruch Goldmann, MD;  Location: [facility];  Service: Ophthalmology;  Laterality: Left;   ANTERIOR VITRECTOMY Right 08/04/2019   Procedure:  ANTERIOR VITRECTOMY;  Surgeon: Baruch Goldmann, MD;  Location: [facility];  Service: Ophthalmology;  Laterality: Right;   CARDIAC CATHETERIZATION     CATARACT EXTRACTION W/PHACO Left 07/21/2019   Procedure: CATARACT EXTRACTION PHACO AND INTRAOCULAR LENS PLACEMENT (IOC) (CDE: 11.45);  Surgeon: Baruch Goldmann, MD;  Location: [facility];  Service: Ophthalmology;  Laterality: Left;   CATARACT EXTRACTION W/PHACO Right 08/04/2019   Procedure: CATARACT EXTRACTION PHACO AND INTRAOCULAR LENS PLACEMENT (IOC);  Surgeon: Baruch Goldmann, MD;  Location: [facility];  Service: Ophthalmology;  Laterality: Right;  CDE: 9.78   CESAREAN SECTION     CORONARY STENT PLACEMENT N/A 06/24/2012   LEFT HEART CATHETERIZATION WITH CORONARY ANGIOGRAM N/A 06/24/2012   Procedure: LEFT HEART CATHETERIZATION WITH CORONARY ANGIOGRAM;  Surgeon: Jolaine Artist, MD;  Location: [facility] CATH LAB;  Service: Cardiovascular;  Laterality: N/A;   right breast lumpectomy     RIGHT/LEFT HEART CATH AND CORONARY ANGIOGRAPHY N/A 02/07/2022   Procedure: RIGHT/LEFT HEART CATH AND CORONARY ANGIOGRAPHY;  Surgeon: Jettie Booze, MD;  Location: [place] CV LAB;  Service: Cardiovascular;  Laterality: N/A;    Current Outpatient Medications  Medication Sig Dispense Refill   amLODipine (NORVASC) 10 MG tablet Take 1 tablet by mouth once daily 90 tablet 0   Ascorbic Acid (VITAMIN C PO) Take 1 tablet by mouth daily.     aspirin EC 81 MG tablet Take 81 mg by mouth daily.     atorvastatin (LIPITOR) 40 MG tablet Take 1 tablet by mouth once  daily 90 tablet 0   chlorthalidone (HYGROTON) 25 MG tablet Take 1 tablet by mouth once daily 90 tablet 0   Cholecalciferol (VITAMIN D) 50 MCG (2000 UT) CAPS Take 2,000 Units by mouth daily.     dorzolamide-timolol (COSOPT) 22.3-6.8 MG/ML ophthalmic solution Place 1 drop into both eyes 2 (two) times daily.     glipiZIDE (GLUCOTROL) 10 MG tablet Take 10 mg by mouth 2 (two) times daily before a meal.     letrozole (FEMARA) 2.5  MG tablet Take 2.5 mg by mouth daily.     lisinopril (ZESTRIL) 40 MG tablet Take 1 tablet by mouth once daily 90 tablet 3   loratadine (CLARITIN) 10 MG tablet Take 10 mg by mouth daily.     metFORMIN (GLUCOPHAGE) 1000 MG tablet Take 1 tablet (1,000 mg total) by mouth 2 (two) times daily.     Metoprolol Tartrate 37.5 MG TABS Take 37.5 mg by mouth 2 (two) times daily.     Multiple Vitamin (MULTIVITAMIN) tablet Take 1 tablet by mouth daily.     famotidine (PEPCID) 20 MG tablet Take 20 mg by mouth daily.     Current Facility-Administered Medications  Medication Dose Route Frequency Provider Last Rate Last Admin   sodium chloride flush (NS) 0.9 % injection 3 mL  3 mL Intravenous Q12H Arnoldo Lenis, MD        Allergies as of 05/16/2022   (No Known Allergies)    Family History  Problem Relation Age of Onset   Heart disease Father    Diabetes Other    Hypertension Other    Cancer Other    Colon cancer Son 22       alive, doing well.    Social History   Socioeconomic History   Marital status: Widowed    Spouse name: Not on file   Number of children: Not on file   Years of education: Not on file   Highest education level: Not on file  Occupational History   Not on file  Tobacco Use   Smoking status: Never    Passive exposure: Never   Smokeless tobacco: Never  Vaping Use   Vaping Use: Never used  Substance and Sexual Activity   Alcohol use: No   Drug use: No   Sexual activity: Yes    Birth control/protection: Post-menopausal  Other Topics Concern   Not on file  Social History Narrative   Not on file   Social Determinants of Health   Financial Resource Strain: Not on file  Food Insecurity: Not on file  Transportation Needs: Not on file  Physical Activity: Not on file  Stress: Not on file  Social Connections: Not on file  Intimate Partner Violence: Not on file     Review of Systems   Gen: Denies any fever, chills, fatigue, weight loss, lack of appetite.   CV: Denies chest pain, heart palpitations, peripheral edema, syncope.  Resp: Denies shortness of breath at rest or with exertion. Denies wheezing or cough.  GI: Denies dysphagia or odynophagia. Denies jaundice, hematemesis, fecal incontinence. GU : Denies urinary burning, urinary frequency, urinary hesitancy MS: Denies joint pain, muscle weakness, cramps, or limitation of movement.  Derm: Denies rash, itching, dry skin Psych: Denies depression, anxiety, memory loss, and confusion Heme: Denies bruising, bleeding, and enlarged lymph nodes.   Physical Exam   BP (!) 166/82   Pulse 63   Temp (!) 97.2 F (36.2 C)   Ht 5' 5.5" (1.664 m)  Wt 282 lb 12.8 oz (128.3 kg)   LMP 01/14/2011   BMI 46.34 kg/m  General:   Alert and oriented. Pleasant and cooperative. Well-nourished and well-developed.  Head:  Normocephalic and atraumatic. Eyes:  Without icterus Ears:  Normal auditory acuity. Lungs:  Clear to auscultation bilaterally.  Heart:  S1, S2 present without murmurs appreciated.  Abdomen:  +BS, soft, non-tender and non-distended. No HSM noted. No guarding or rebound. No masses appreciated.  Rectal:  no obvious mass appreciated. Small amount of soft stool in rectal vault Msk:  Symmetrical without gross deformities. Normal posture. Extremities:  Without edema. Neurologic:  Alert and  oriented x4;  grossly normal neurologically. Skin:  Intact without significant lesions or rashes. Psych:  Alert and cooperative. Normal mood and affect.   Assessment   Sandra Schneider is a 66 y.o. female presenting today at the request of Dr. Harl Bowie due to concern for rectal mass on PET CT Nov 2023.   Recent colonoscopy by Dr. Loyola Mast in April 2022 with two 3 mm cecal polyps, but I do not have access to path. Interesting, she notes her son has history of colon cancer and diagnosed at age 53.  She has no concerning upper or lower GI signs/symptoms. Rectal exam today somewhat difficult with stool in  rectal vault but no glaring mass able to be palpated.     Expeditious colonoscopy to be arranged.    PLAN   Proceed with colonoscopy by Dr. Abbey Chatters  in near future: the risks, benefits, and alternatives have been discussed with the patient in detail. The patient states understanding and desires to proceed.   Further recommendations to follow   Annitta Needs, PhD, ANP-BC [facility]

## 2022-05-19 ENCOUNTER — Encounter (HOSPITAL_COMMUNITY)
Admission: RE | Admit: 2022-05-19 | Discharge: 2022-05-19 | Disposition: A | Discharge status: Home / Self Care | Payer: Medicare Other | Source: Ambulatory Visit | Attending: Internal Medicine | Admitting: Internal Medicine

## 2022-05-21 ENCOUNTER — Encounter: Payer: Self-pay | Admitting: Gastroenterology

## 2022-05-22 ENCOUNTER — Ambulatory Visit (HOSPITAL_COMMUNITY)
Admission: RE | Admit: 2022-05-22 | Discharge: 2022-05-22 | Disposition: A | Discharge status: Home / Self Care | Payer: Medicare Other | Source: Ambulatory Visit | Attending: Internal Medicine | Admitting: Internal Medicine

## 2022-05-22 ENCOUNTER — Encounter (HOSPITAL_COMMUNITY): Payer: Self-pay

## 2022-05-22 ENCOUNTER — Ambulatory Visit (HOSPITAL_BASED_OUTPATIENT_CLINIC_OR_DEPARTMENT_OTHER): Payer: Medicare Other | Admitting: Anesthesiology

## 2022-05-22 ENCOUNTER — Ambulatory Visit (HOSPITAL_COMMUNITY): Payer: Medicare Other | Admitting: Anesthesiology

## 2022-05-22 ENCOUNTER — Other Ambulatory Visit: Payer: Self-pay

## 2022-05-22 ENCOUNTER — Encounter (HOSPITAL_COMMUNITY)
Admission: RE | Disposition: A | Discharge status: Home / Self Care | Payer: Self-pay | Source: Ambulatory Visit | Attending: Internal Medicine

## 2022-05-22 DIAGNOSIS — Z8719 Personal history of other diseases of the digestive system: Secondary | ICD-10-CM | POA: Diagnosis not present

## 2022-05-22 DIAGNOSIS — K648 Other hemorrhoids: Secondary | ICD-10-CM

## 2022-05-22 DIAGNOSIS — I10 Essential (primary) hypertension: Secondary | ICD-10-CM | POA: Diagnosis not present

## 2022-05-22 DIAGNOSIS — Z7984 Long term (current) use of oral hypoglycemic drugs: Secondary | ICD-10-CM | POA: Diagnosis not present

## 2022-05-22 DIAGNOSIS — R933 Abnormal findings on diagnostic imaging of other parts of digestive tract: Secondary | ICD-10-CM

## 2022-05-22 DIAGNOSIS — Z800 Family history of malignant neoplasm of digestive organs: Secondary | ICD-10-CM | POA: Insufficient documentation

## 2022-05-22 DIAGNOSIS — I272 Pulmonary hypertension, unspecified: Secondary | ICD-10-CM | POA: Diagnosis not present

## 2022-05-22 DIAGNOSIS — K219 Gastro-esophageal reflux disease without esophagitis: Secondary | ICD-10-CM | POA: Insufficient documentation

## 2022-05-22 DIAGNOSIS — E119 Type 2 diabetes mellitus without complications: Secondary | ICD-10-CM | POA: Insufficient documentation

## 2022-05-22 DIAGNOSIS — I252 Old myocardial infarction: Secondary | ICD-10-CM | POA: Insufficient documentation

## 2022-05-22 DIAGNOSIS — Z6841 Body Mass Index (BMI) 40.0 and over, adult: Secondary | ICD-10-CM | POA: Insufficient documentation

## 2022-05-22 DIAGNOSIS — I2510 Atherosclerotic heart disease of native coronary artery without angina pectoris: Secondary | ICD-10-CM | POA: Diagnosis not present

## 2022-05-22 DIAGNOSIS — Z86010 Personal history of colonic polyps: Secondary | ICD-10-CM | POA: Diagnosis not present

## 2022-05-22 DIAGNOSIS — K6289 Other specified diseases of anus and rectum: Secondary | ICD-10-CM | POA: Diagnosis not present

## 2022-05-22 DIAGNOSIS — K649 Unspecified hemorrhoids: Secondary | ICD-10-CM

## 2022-05-22 HISTORY — PX: COLONOSCOPY WITH PROPOFOL: SHX5780

## 2022-05-22 LAB — GLUCOSE, CAPILLARY: Glucose-Capillary: 109 mg/dL — ABNORMAL HIGH (ref 70–99)

## 2022-05-22 SURGERY — COLONOSCOPY WITH PROPOFOL
Anesthesia: General

## 2022-05-22 MED ORDER — LIDOCAINE HCL (CARDIAC) PF 100 MG/5ML IV SOSY
PREFILLED_SYRINGE | INTRAVENOUS | Status: DC | PRN
  Administered 2022-05-22: 60 mg via INTRAVENOUS

## 2022-05-22 MED ORDER — PROPOFOL 500 MG/50ML IV EMUL
INTRAVENOUS | Status: DC | PRN
  Administered 2022-05-22: 50 ug/kg/min via INTRAVENOUS

## 2022-05-22 MED ORDER — LIDOCAINE HCL (PF) 2 % IJ SOLN
INTRAMUSCULAR | Status: AC
  Filled 2022-05-22: qty 5

## 2022-05-22 MED ORDER — PROPOFOL 10 MG/ML IV BOLUS
INTRAVENOUS | Status: DC | PRN
  Administered 2022-05-22: 30 mg via INTRAVENOUS
  Administered 2022-05-22: 40 mg via INTRAVENOUS

## 2022-05-22 MED ORDER — LACTATED RINGERS IV SOLN: INTRAVENOUS | Status: DC

## 2022-05-22 MED ORDER — PROPOFOL 500 MG/50ML IV EMUL
INTRAVENOUS | Status: AC
  Filled 2022-05-22: qty 50

## 2022-05-22 NOTE — Interval H&P Note (Signed)
History and Physical Interval Note:  05/22/2022 9:23 AM  Sandra Schneider  has presented today for surgery, with the diagnosis of rectal mass on PET.  The various methods of treatment have been discussed with the patient and family. After consideration of risks, benefits and other options for treatment, the patient has consented to  Procedure(s) with comments: COLONOSCOPY WITH PROPOFOL (N/A) - 10:00 am as a surgical intervention.  The patient's history has been reviewed, patient examined, no change in status, stable for surgery.  I have reviewed the patient's chart and labs.  Questions were answered to the patient's satisfaction.     [name]

## 2022-05-22 NOTE — Transfer of Care (Signed)
Immediate Anesthesia Transfer of Care Note  Patient: Sandra Schneider  Procedure(s) Performed: COLONOSCOPY WITH PROPOFOL  Patient Location: PACU  Anesthesia Type:General  Level of Consciousness: drowsy  Airway & Oxygen Therapy: Patient Spontanous Breathing and Patient connected to face mask oxygen  Post-op Assessment: Report given to RN and Post -op Vital signs reviewed and stable  Post vital signs: Reviewed and stable  Last Vitals:  Vitals Value Taken Time  BP 126/80   Temp 97.1   Pulse 55 05/22/22 0953  Resp 14 05/22/22 0953  SpO2 94 % 05/22/22 0953  Vitals shown include unvalidated device data.  Last Pain:  Vitals:   05/22/22 0932  TempSrc:   PainSc: 0-No pain         Complications: No notable events documented.

## 2022-05-22 NOTE — Op Note (Signed)
[facility] Patient Name: Sandra Schneider Procedure Date: 05/22/2022 9:27 AM MRN: [id number] Date of Birth: [birth year] Attending MD: Elon Alas. Abbey Chatters , [place], [id number] CSN: [id number] Age: 67 Admit Type: Outpatient Procedure:                Colonoscopy Indications:              Abnormal PET scan of the GI tract Providers:                Elon Alas. Abbey Chatters, DO, Caprice Kluver, Everardo [place] Referring MD:              Medicines:                See the Anesthesia note for documentation of the                            administered medications Complications:            No immediate complications. Estimated Blood Loss:     Estimated blood loss: none. Procedure:                Pre-Anesthesia Assessment:                           - The anesthesia plan was to use monitored                            anesthesia care (MAC).                           After obtaining informed consent, the colonoscope                            was passed under direct vision. Throughout the                            procedure, the patient's blood pressure, pulse, and                            oxygen saturations were monitored continuously. The                            [id number]-HQ190L ([id number]) scope was introduced through                            the anus and advanced to the the cecum, identified                            by appendiceal orifice and ileocecal valve. The                            colonoscopy was performed without difficulty. The                            patient tolerated the procedure well. The quality  of the bowel preparation was evaluated using the                            BBPS [facility] Bowel Preparation Scale) with scores                            of: Right Colon = 3, Transverse Colon = 3 and Left                            Colon = 3 (entire mucosa seen well with no residual                            staining, small fragments of stool or opaque                             liquid). The total BBPS score equals 9. Scope In: 4:31:54 AM Scope Out: 9:50:18 AM Scope Withdrawal Time: 0 hours 10 minutes 15 seconds  Total Procedure Duration: 0 hours 14 minutes 4 seconds  Findings:      The perianal and digital rectal examinations were normal.      Non-bleeding internal hemorrhoids were found during retroflexion and       during endoscopy. No evidence of rectal mass/polyp      The exam was otherwise without abnormality. Impression:               - Non-bleeding internal hemorrhoids.                           - The examination was otherwise normal.                           - No specimens collected. Moderate Sedation:      Per Anesthesia Care Recommendation:           - Patient has a contact number available for                            emergencies. The signs and symptoms of potential                            delayed complications were discussed with the                            patient. Return to normal activities tomorrow.                            Written discharge instructions were provided to the                            patient.                           - Resume previous diet.                           - Continue present medications.                           -  Repeat colonoscopy in 10 years for screening                            purposes.                           - Return to GI clinic PRN. Procedure Code(s):        --- Professional ---                           [phone], Colonoscopy, flexible; diagnostic, including                            collection of specimen(s) by brushing or washing,                            when performed (separate procedure) Diagnosis Code(s):        --- Professional ---                           K64.8, Other hemorrhoids                           R93.3, Abnormal findings on diagnostic imaging of                            other parts of digestive tract CPT copyright 2022 American Medical Association. All  rights reserved. The codes documented in this report are preliminary and upon coder review may  be revised to meet current compliance requirements. Elon Alas. Abbey Chatters, DO [place] Abbey Chatters, DO 05/22/2022 9:55:43 AM This report has been signed electronically. Number of Addenda: 0

## 2022-05-22 NOTE — Discharge Instructions (Signed)
  Colonoscopy Discharge Instructions  Read the instructions outlined below and refer to this sheet in the next few weeks. These discharge instructions provide you with general information on caring for yourself after you leave the hospital. Your doctor may also give you specific instructions. While your treatment has been planned according to the most current medical practices available, unavoidable complications occasionally occur.   ACTIVITY You may resume your regular activity, but move at a slower pace for the next 24 hours.  Take frequent rest periods for the next 24 hours.  Walking will help get rid of the air and reduce the bloated feeling in your belly (abdomen).  No driving for 24 hours (because of the medicine (anesthesia) used during the test).   Do not sign any important legal documents or operate any machinery for 24 hours (because of the anesthesia used during the test).  NUTRITION Drink plenty of fluids.  You may resume your normal diet as instructed by your doctor.  Begin with a light meal and progress to your normal diet. Heavy or fried foods are harder to digest and may make you feel sick to your stomach (nauseated).  Avoid alcoholic beverages for 24 hours or as instructed.  MEDICATIONS You may resume your normal medications unless your doctor tells you otherwise.  WHAT YOU CAN EXPECT TODAY Some feelings of bloating in the abdomen.  Passage of more gas than usual.  Spotting of blood in your stool or on the toilet paper.  IF YOU HAD POLYPS REMOVED DURING THE COLONOSCOPY: No aspirin products for 7 days or as instructed.  No alcohol for 7 days or as instructed.  Eat a soft diet for the next 24 hours.  FINDING OUT THE RESULTS OF YOUR TEST Not all test results are available during your visit. If your test results are not back during the visit, make an appointment with your caregiver to find out the results. Do not assume everything is normal if you have not heard from your  caregiver or the medical facility. It is important for you to follow up on all of your test results.  SEEK IMMEDIATE MEDICAL ATTENTION IF: You have more than a spotting of blood in your stool.  Your belly is swollen (abdominal distention).  You are nauseated or vomiting.  You have a temperature over 101.  You have abdominal pain or discomfort that is severe or gets worse throughout the day.   Your colonoscopy was relatively unremarkable.  I did not find any polyps or evidence of colon cancer.  I recommend repeating colonoscopy in 10 years for colon cancer screening purposes.  You do have internal hemorrhoids. I would recommend increasing fiber in your diet or adding OTC Benefiber/Metamucil. Be sure to drink at least 4 to 6 glasses of water daily. Follow-up with GI as needed.   I hope you have a great rest of your week!  Elon Alas. Abbey Chatters, D.O. Gastroenterology and Hepatology [facility]

## 2022-05-22 NOTE — Anesthesia Preprocedure Evaluation (Signed)
Anesthesia Evaluation  Patient identified by MRN, date of birth, ID band Patient awake    Reviewed: Allergy & Precautions, H&P , NPO status , Patient's Chart, lab work & pertinent test results, reviewed documented beta blocker date and time   Airway Mallampati: II  TM Distance: >3 FB Neck ROM: full    Dental no notable dental hx.    Pulmonary neg pulmonary ROS, shortness of breath   Pulmonary exam normal breath sounds clear to auscultation       Cardiovascular Exercise Tolerance: Good hypertension, pulmonary hypertension (Severe)+ CAD and + Past MI   Rhythm:regular Rate:Normal     Neuro/Psych negative neurological ROS  negative psych ROS   GI/Hepatic negative GI ROS, Neg liver ROS,GERD  ,,  Endo/Other  diabetes, Type 2  Morbid obesity  Renal/GU negative Renal ROS  negative genitourinary   Musculoskeletal   Abdominal   Peds  Hematology negative hematology ROS (+) Blood dyscrasia, anemia   Anesthesia Other Findings   Reproductive/Obstetrics negative OB ROS                             Anesthesia Physical Anesthesia Plan  ASA: 4  Anesthesia Plan: General   Post-op Pain Management:    Induction:   PONV Risk Score and Plan: Propofol infusion  Airway Management Planned:   Additional Equipment:   Intra-op Plan:   Post-operative Plan:   Informed Consent: I have reviewed the patients History and Physical, chart, labs and discussed the procedure including the risks, benefits and alternatives for the proposed anesthesia with the patient or authorized representative who has indicated his/her understanding and acceptance.     Dental Advisory Given  Plan Discussed with: CRNA  Anesthesia Plan Comments:        Anesthesia Quick Evaluation

## 2022-05-23 NOTE — Anesthesia Postprocedure Evaluation (Signed)
Anesthesia Post Note  Patient: Sandra Schneider  Procedure(s) Performed: COLONOSCOPY WITH PROPOFOL  Patient location during evaluation: Phase II Anesthesia Type: General Level of consciousness: awake Pain management: pain level controlled Vital Signs Assessment: post-procedure vital signs reviewed and stable Respiratory status: spontaneous breathing and respiratory function stable Cardiovascular status: blood pressure returned to baseline and stable Postop Assessment: no headache and no apparent nausea or vomiting Anesthetic complications: no Comments: Late entry   No notable events documented.   Last Vitals:  Vitals:   05/22/22 1000 05/22/22 1008  BP: 134/60 130/65  Pulse: (!) 56 62  Resp: 17 18  Temp:  36.7 C  SpO2: 100% 94%    Last Pain:  Vitals:   05/22/22 1008  TempSrc: Oral  PainSc: 0-No pain                 Louann Sjogren

## 2022-05-24 DIAGNOSIS — N641 Fat necrosis of breast: Secondary | ICD-10-CM | POA: Diagnosis not present

## 2022-05-24 DIAGNOSIS — N6311 Unspecified lump in the right breast, upper outer quadrant: Secondary | ICD-10-CM | POA: Diagnosis not present

## 2022-05-26 ENCOUNTER — Encounter (HOSPITAL_COMMUNITY): Payer: Self-pay | Admitting: Internal Medicine

## 2022-06-05 ENCOUNTER — Encounter: Payer: Self-pay | Admitting: Pulmonary Disease

## 2022-06-05 ENCOUNTER — Ambulatory Visit (INDEPENDENT_AMBULATORY_CARE_PROVIDER_SITE_OTHER): Payer: Medicare Other | Admitting: Pulmonary Disease

## 2022-06-05 VITALS — BP 136/84 | HR 78 | Temp 98.4°F | Ht 65.5 in | Wt 288.2 lb

## 2022-06-05 DIAGNOSIS — R0683 Snoring: Secondary | ICD-10-CM

## 2022-06-05 DIAGNOSIS — I272 Pulmonary hypertension, unspecified: Secondary | ICD-10-CM

## 2022-06-05 DIAGNOSIS — R911 Solitary pulmonary nodule: Secondary | ICD-10-CM | POA: Diagnosis not present

## 2022-06-05 NOTE — Patient Instructions (Signed)
  Home sleep test 

## 2022-06-05 NOTE — Assessment & Plan Note (Signed)
Appears to be WHO 2 /3 There is no evidence of CTEPH. Underlying pulmonary disease is unclear -PFTs did not show any airway obstruction, she is a never smoker, does show moderate restriction intraparenchymal, no evidence of ILD on CT.  Decreased DLCO may be related to pulmonary hypertension.  She does not seem to have significant obesity hypoventilation on evaluation of VBG.

## 2022-06-05 NOTE — Progress Notes (Signed)
Subjective:    Patient ID: Sandra Schneider, female    DOB: 10-Jul-1955, 67 y.o.   MRN: [id number]  HPI  Chief Complaint  Patient presents with   Consult    Sleep consult. Snores and feels tired most of the time. 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 She has been working the night shift for at least 26 years, part-time for the last 8 years, she works three 8-hour shifts in a week and some overtime. She presented with dyspnea on exertion ongoing for several months and was diagnosed with severe pulm hypertension on echo, right heart cath confirmed and suggested elevated LVEDP.  PFTs shows moderate restriction, intraparenchymal.  CT chest did not show any evidence of ILD but showed mild mediastinal lymphadenopathy and a right upper lobe nodule.  Hence PET scan was performed which did not show any hypermetabolic them in these areas, suggested rectal mass and hypermetabolic breast mass.  Mammogram was negative and so was colonoscopy. She is now referred for further evaluation of sleep apnea.  Reports sleepiness score is 7/24 Sandra Schneider answers 2/5 on STOP-BANG questionnaire with loud snoring and restless and nonrefreshing sleep. Loud snoring has been noted by family members and her husband had noted witnessed apneas when he was alive..  Bedtime is variable and she sleeps best between 6 PM and 10 PM after which she wakes up and goes into work.  On nights she is not working, she will sleep interruptedly through the night, TV stays on, she sleeps on her side with 1 pillow, reports 3-4 awakenings and is out of bed by 7 AM. She has gained 10 to 15 pounds over the last 2 years There is no history suggestive of cataplexy, sleep paralysis or parasomnias   PMH -hypertension Diabetes type 2 CAD status post DES to LAD and left circumflex Breast cancer 2021 s/p lumpectomy and radiation, now  on Femara  Significant tests/ events reviewed  01/2022 echo normal LVEF, enlarged RV, PASP 80 01/2022 LHC/RHC-CI 3.1, RA 32, PA 107/48, mean PA 69, PCWP?  Unable to estimate, elevated LVEDP  VBG 7.2 9/52/41  PFTs 03/2022 moderate restriction, ratio 84, FEV1 65%, FVC 59%, TLC 74%, DLCO 13.6/64%.  02/2022 VQ scan negative. CT chest without con 02/2022 right upper lobe nodule, mild right paratracheal right subcarinal lymphadenopathy, no evidence of ILD  PET scan 03/2022 no hypermetabolism in lung nodule or lymphadenopathy, possible rectal mass, breast mass mildly hypermetabolic SUV 2.8     Past Medical History:  Diagnosis Date   Breast cancer ([place])    Coronary artery disease    Diabetes mellitus    GERD (gastroesophageal reflux disease)    Hypertension    Myocardial abscess    Myocardial infarct ([place]) 06/24/2012   Pulmonary hypertension ([place])    Past Surgical History:  Procedure Laterality Date   ANTERIOR VITRECTOMY Left 07/21/2019   Procedure: ANTERIOR VITRECTOMY;  Surgeon: Baruch Goldmann, MD;  Location: [facility];  Service: Ophthalmology;  Laterality: Left;   ANTERIOR VITRECTOMY Right 08/04/2019   Procedure: ANTERIOR VITRECTOMY;  Surgeon: Baruch Goldmann, MD;  Location: [facility];  Service: Ophthalmology;  Laterality: Right;   CARDIAC CATHETERIZATION     CATARACT EXTRACTION W/PHACO Left 07/21/2019   Procedure: CATARACT EXTRACTION PHACO AND INTRAOCULAR LENS PLACEMENT (IOC) (CDE: 11.45);  Surgeon: Baruch Goldmann, MD;  Location: [facility];  Service: Ophthalmology;  Laterality: Left;   CATARACT EXTRACTION W/PHACO  Right 08/04/2019   Procedure: CATARACT EXTRACTION PHACO AND INTRAOCULAR LENS PLACEMENT (IOC);  Surgeon: Baruch Goldmann, MD;  Location: [facility];  Service: Ophthalmology;  Laterality: Right;  CDE: 9.78   CESAREAN SECTION     COLONOSCOPY WITH PROPOFOL N/A 05/22/2022   Procedure: COLONOSCOPY WITH PROPOFOL;  Surgeon: [name], DO;  Location: [facility];  Service: Endoscopy;   Laterality: N/A;  10:00 am   CORONARY STENT PLACEMENT N/A 06/24/2012   LEFT HEART CATHETERIZATION WITH CORONARY ANGIOGRAM N/A 06/24/2012   Procedure: LEFT HEART CATHETERIZATION WITH CORONARY ANGIOGRAM;  Surgeon: Jolaine Artist, MD;  Location: [facility] CATH LAB;  Service: Cardiovascular;  Laterality: N/A;   right breast lumpectomy     RIGHT/LEFT HEART CATH AND CORONARY ANGIOGRAPHY N/A 02/07/2022   Procedure: RIGHT/LEFT HEART CATH AND CORONARY ANGIOGRAPHY;  Surgeon: Jettie Booze, MD;  Location: [facility] CV LAB;  Service: Cardiovascular;  Laterality: N/A;    No Known Allergies  Social History   Socioeconomic History   Marital status: Widowed    Spouse name: Not on file   Number of children: Not on file   Years of education: Not on file   Highest education level: Not on file  Occupational History   Not on file  Tobacco Use   Smoking status: Never    Passive exposure: Never   Smokeless tobacco: Never  Vaping Use   Vaping Use: Never used  Substance and Sexual Activity   Alcohol use: No   Drug use: No   Sexual activity: Yes    Birth control/protection: Post-menopausal  Other Topics Concern   Not on file  Social History Narrative   Not on file   Social Determinants of Health   Financial Resource Strain: Not on file  Food Insecurity: Not on file  Transportation Needs: Not on file  Physical Activity: Not on file  Stress: Not on file  Social Connections: Not on file  Intimate Partner Violence: Not on file   Family History  Problem Relation Age of Onset   Heart disease Father    Diabetes Other    Hypertension Other    Cancer Other    Colon cancer Son 22       alive, doing well.     Review of Systems   Constitutional: negative for anorexia, fevers and sweats  Eyes: negative for irritation, redness and visual disturbance  Ears, nose, mouth, throat, and face: negative for earaches, epistaxis, nasal congestion and sore throat  Respiratory: negative for cough,  sputum and wheezing  Cardiovascular: negative for chest pain, orthopnea, palpitations and syncope  Gastrointestinal: negative for abdominal pain, constipation, diarrhea, melena, nausea and vomiting  Genitourinary:negative for dysuria, frequency and hematuria  Hematologic/lymphatic: negative for bleeding, easy bruising and lymphadenopathy  Musculoskeletal:negative for arthralgias, muscle weakness and stiff joints  Neurological: negative for coordination problems, gait problems, headaches and weakness  Endocrine: negative for diabetic symptoms including polydipsia, polyuria and weight loss     Objective:   Physical Exam  Gen. Pleasant, obese, in no distress, normal affect ENT - no pallor,icterus, no post nasal drip, class 2-3 airway Neck: No JVD, no thyromegaly, no carotid bruits Lungs: no use of accessory muscles, no dullness to percussion, decreased without rales or rhonchi  Cardiovascular: Rhythm regular, heart sounds  normal, no murmurs or gallops, 1+ peripheral edema Abdomen: soft and non-tender, no hepatosplenomegaly, BS normal. Musculoskeletal: No deformities, no cyanosis or clubbing Neuro:  alert, non focal, no tremors       Assessment &  Plan:    OSA - Given excessive daytime somnolence, narrow pharyngeal exam, witnessed apneas & loud snoring, obstructive sleep apnea is very likely & an overnight polysomnogram will be scheduled as a home study. The pathophysiology of obstructive sleep apnea , it's cardiovascular consequences & modes of treatment including CPAP were discused with the patient in detail & they evidenced understanding.  Differential diagnosis of hypersomnolence includes shiftwork disorder/circadian rhythm disorder due to working the night shift for so many years.  She is planning retirement in March and To see if she can get back to her normal circadian cycle.  I have asked her to perform the sleep study from 6 PM to 7 AM and document how many hours she actually  sleeps

## 2022-06-05 NOTE — Assessment & Plan Note (Signed)
Incidental finding on CT chest with mild mediastinal lymphadenopathy. No significant hypermetabolic enlarged PET scan indicating benign nature. 57-monthfollow-up CT chest will be scheduled in this never smoker with low probability of malignancy

## 2022-06-07 DIAGNOSIS — I10 Essential (primary) hypertension: Secondary | ICD-10-CM | POA: Diagnosis not present

## 2022-06-07 DIAGNOSIS — Z79899 Other long term (current) drug therapy: Secondary | ICD-10-CM | POA: Diagnosis not present

## 2022-06-07 DIAGNOSIS — E785 Hyperlipidemia, unspecified: Secondary | ICD-10-CM | POA: Diagnosis not present

## 2022-06-07 DIAGNOSIS — I2510 Atherosclerotic heart disease of native coronary artery without angina pectoris: Secondary | ICD-10-CM | POA: Diagnosis not present

## 2022-06-07 DIAGNOSIS — E119 Type 2 diabetes mellitus without complications: Secondary | ICD-10-CM | POA: Diagnosis not present

## 2022-06-08 DIAGNOSIS — D120 Benign neoplasm of cecum: Secondary | ICD-10-CM | POA: Diagnosis not present

## 2022-06-08 DIAGNOSIS — Z170 Estrogen receptor positive status [ER+]: Secondary | ICD-10-CM | POA: Diagnosis not present

## 2022-06-08 DIAGNOSIS — C50411 Malignant neoplasm of upper-outer quadrant of right female breast: Secondary | ICD-10-CM | POA: Diagnosis not present

## 2022-06-13 ENCOUNTER — Encounter (HOSPITAL_COMMUNITY): Payer: Self-pay | Admitting: Cardiology

## 2022-06-13 ENCOUNTER — Ambulatory Visit (HOSPITAL_COMMUNITY)
Admission: RE | Admit: 2022-06-13 | Discharge: 2022-06-13 | Disposition: A | Discharge status: Home / Self Care | Payer: Medicare Other | Source: Ambulatory Visit | Attending: Cardiology | Admitting: Cardiology

## 2022-06-13 VITALS — BP 130/70 | HR 68 | Wt 295.5 lb

## 2022-06-13 DIAGNOSIS — I272 Pulmonary hypertension, unspecified: Secondary | ICD-10-CM | POA: Insufficient documentation

## 2022-06-13 DIAGNOSIS — I2510 Atherosclerotic heart disease of native coronary artery without angina pectoris: Secondary | ICD-10-CM | POA: Insufficient documentation

## 2022-06-13 DIAGNOSIS — Z853 Personal history of malignant neoplasm of breast: Secondary | ICD-10-CM | POA: Insufficient documentation

## 2022-06-13 DIAGNOSIS — I10 Essential (primary) hypertension: Secondary | ICD-10-CM | POA: Insufficient documentation

## 2022-06-13 DIAGNOSIS — I252 Old myocardial infarction: Secondary | ICD-10-CM | POA: Diagnosis not present

## 2022-06-13 DIAGNOSIS — G4733 Obstructive sleep apnea (adult) (pediatric): Secondary | ICD-10-CM | POA: Diagnosis not present

## 2022-06-13 DIAGNOSIS — E785 Hyperlipidemia, unspecified: Secondary | ICD-10-CM | POA: Diagnosis not present

## 2022-06-13 DIAGNOSIS — Z79899 Other long term (current) drug therapy: Secondary | ICD-10-CM | POA: Diagnosis not present

## 2022-06-13 DIAGNOSIS — R0602 Shortness of breath: Secondary | ICD-10-CM | POA: Insufficient documentation

## 2022-06-13 DIAGNOSIS — I50810 Right heart failure, unspecified: Secondary | ICD-10-CM

## 2022-06-13 DIAGNOSIS — Z8249 Family history of ischemic heart disease and other diseases of the circulatory system: Secondary | ICD-10-CM | POA: Diagnosis not present

## 2022-06-13 DIAGNOSIS — Z955 Presence of coronary angioplasty implant and graft: Secondary | ICD-10-CM | POA: Insufficient documentation

## 2022-06-13 DIAGNOSIS — R600 Localized edema: Secondary | ICD-10-CM | POA: Diagnosis not present

## 2022-06-13 DIAGNOSIS — R918 Other nonspecific abnormal finding of lung field: Secondary | ICD-10-CM | POA: Insufficient documentation

## 2022-06-13 LAB — BASIC METABOLIC PANEL
Anion gap: 7 (ref 5–15)
BUN: 26 mg/dL — ABNORMAL HIGH (ref 8–23)
CO2: 29 mmol/L (ref 22–32)
Calcium: 9.7 mg/dL (ref 8.9–10.3)
Chloride: 99 mmol/L (ref 98–111)
Creatinine, Ser: 1.03 mg/dL — ABNORMAL HIGH (ref 0.44–1.00)
GFR, Estimated: 60 mL/min — ABNORMAL LOW (ref >60)
Glucose, Bld: 89 mg/dL (ref 70–99)
Potassium: 4.9 mmol/L (ref 3.5–5.1)
Sodium: 135 mmol/L (ref 135–145)

## 2022-06-13 LAB — CBC
HCT: 39.3 % (ref 36.0–46.0)
Hemoglobin: 12.8 g/dL (ref 12.0–15.0)
MCH: 31.5 pg (ref 26.0–34.0)
MCHC: 32.6 g/dL (ref 30.0–36.0)
MCV: 96.8 fL (ref 80.0–100.0)
Platelets: 244 10*3/uL (ref 150–400)
RBC: 4.06 MIL/uL (ref 3.87–5.11)
RDW: 14.7 % (ref 11.5–15.5)
WBC: 7.6 10*3/uL (ref 4.0–10.5)
nRBC: 0 % (ref 0.0–0.2)

## 2022-06-13 LAB — HEPATIC FUNCTION PANEL
ALT: 42 U/L (ref 0–44)
AST: 48 U/L — ABNORMAL HIGH (ref 15–41)
Albumin: 4 g/dL (ref 3.5–5.0)
Alkaline Phosphatase: 69 U/L (ref 38–126)
Bilirubin, Direct: 0.3 mg/dL — ABNORMAL HIGH (ref 0.0–0.2)
Indirect Bilirubin: 0.6 mg/dL (ref 0.3–0.9)
Total Bilirubin: 0.9 mg/dL (ref 0.3–1.2)
Total Protein: 7.3 g/dL (ref 6.5–8.1)

## 2022-06-13 LAB — BRAIN NATRIURETIC PEPTIDE: B Natriuretic Peptide: 1228.5 pg/mL — ABNORMAL HIGH (ref 0.0–100.0)

## 2022-06-13 MED ORDER — FUROSEMIDE 20 MG PO TABS: 40.0000 mg | ORAL_TABLET | Freq: Every day | ORAL | 3 refills | Status: DC

## 2022-06-13 NOTE — Progress Notes (Signed)
ReDS Vest / Clip - 06/13/22 1300       ReDS Vest / Clip   Station Marker B    Ruler Value 39    ReDS Value Range Moderate volume overload    ReDS Actual Value 39

## 2022-06-13 NOTE — Progress Notes (Signed)
ADVANCED HEART FAILURE CLINIC NOTE  Referring Physician: [facility], [place]: [facility], [facility] Primary Cardiologist:  HPI: Sandra Schneider is a 67 y.o. female who presents for initial visit for further evaluation and treatment of heart failure/cardiomyopathy.    Interval hx:  Reports taht her legs started welling roughly 1 week ago; however, no worsening of her shortness of breath. Prior to 8/23, she reports being able to perform ADLs, go to the grocery store without difficulty. Now she cannot ambulate more than 40-52f at most before becoming severely SOB. She is on O2 today in clinic due to sat of 87%; her baseline sats are generally in the high 90s. She has never smoked herself but is exposed to significant second hand smoke.   Activity level/exercise tolerance:  *** Orthopnea:  Sleeps on *** pillows Paroxysmal noctural dyspnea:  *** Chest pain/pressure:  *** Orthostatic lightheadedness:  *** Palpitations:  *** Lower extremity edema:  *** Presyncope/syncope:  *** Cough:  ***  Past Medical History:  Diagnosis Date   [place]    Coronary artery disease    Diabetes mellitus    GERD (gastroesophageal reflux disease)    Hypertension    Myocardial abscess    [place]   Pulmonary hypertension ([facility])     Current Outpatient Medications  Medication Sig Dispense Refill   amLODipine (NORVASC) 10 MG tablet Take 1 tablet by mouth once daily 90 tablet 0   Ascorbic Acid (VITAMIN C PO) Take 1 tablet by mouth daily.     aspirin EC 81 MG tablet Take 81 mg by mouth daily.     atorvastatin (LIPITOR) 40 MG tablet Take 1 tablet by mouth once daily 90 tablet 0   chlorthalidone (HYGROTON) 25 MG tablet Take 1 tablet by mouth once daily 90 tablet 0   Cholecalciferol (VITAMIN D) 50 MCG (2000 UT) CAPS Take 2,000 Units by mouth daily.     dorzolamide-timolol (COSOPT) 22.3-6.8 MG/ML ophthalmic solution Place 1  drop into both eyes 2 (two) times daily.     famotidine (PEPCID) 20 MG tablet Take 20 mg by mouth daily.     glipiZIDE (GLUCOTROL) 10 MG tablet Take 10 mg by mouth 2 (two) times daily before a meal.     letrozole (FEMARA) 2.5 MG tablet Take 2.5 mg by mouth daily.     lisinopril (ZESTRIL) 40 MG tablet Take 1 tablet by mouth once daily 90 tablet 3   loratadine (CLARITIN) 10 MG tablet Take 10 mg by mouth daily.     metFORMIN (GLUCOPHAGE) 1000 MG tablet Take 1 tablet (1,000 mg total) by mouth 2 (two) times daily.     Metoprolol Tartrate 37.5 MG TABS Take 37.5 mg by mouth 2 (two) times daily.     Multiple Vitamin (MULTIVITAMIN) tablet Take 1 tablet by mouth daily.     Current Facility-Administered Medications  Medication Dose Route Frequency Provider Last Rate Last Admin   sodium chloride flush (NS) 0.9 % injection 3 mL  3 mL Intravenous Q12H Branch, JAlphonse Guild MD        No Known Allergies    Social History   Socioeconomic History   Marital status: Widowed    Spouse name: Not on file   Number of children: Not on file   Years of education: Not on file   Highest education level: Not on file  Occupational History   Not on file  Tobacco Use   Smoking status: Never  Passive exposure: Never   Smokeless tobacco: Never  Vaping Use   Vaping Use: Never used  Substance and Sexual Activity   Alcohol use: No   Drug use: No   Sexual activity: Yes    Birth control/protection: Post-menopausal  Other Topics Concern   Not on file  Social History Narrative   Not on file   Social Determinants of Health   Financial Resource Strain: Not on file  Food Insecurity: Not on file  Transportation Needs: Not on file  Physical Activity: Not on file  Stress: Not on file  Social Connections: Not on file  Intimate Partner Violence: Not on file      Family History  Problem Relation Age of Onset   Heart disease Father    Diabetes Other    Hypertension Other    Cancer Other    Colon cancer Son  22       alive, doing well.    PHYSICAL EXAM: Vitals:   06/13/22 1046 06/13/22 1052  BP: 130/70   Pulse: 68   SpO2: (!) 87% 97%   GENERAL: Well nourished, well developed, and in no apparent distress at rest.  HEENT: Negative for arcus senilis or xanthelasma. There is no scleral icterus.  The mucous membranes are pink and moist.   NECK: Supple, No masses. Normal carotid upstrokes without bruits. No masses or thyromegaly.    CHEST: There are no chest wall deformities. There is no chest wall tenderness. Respirations are unlabored.  Lungs- *** CARDIAC:  JVP: *** cm H2O         {HEART SOUNDS:22645}  Normal rate with regular rhythm. No murmurs, rubs or gallops.  Pulses are 2+ and symmetrical in upper and lower extremities. *** edema.  ABDOMEN: Soft, non-tender, non-distended. There are no masses or hepatomegaly. There are normal bowel sounds.  EXTREMITIES: Warm and well perfused with no cyanosis, clubbing.  LYMPHATIC: No axillary or supraclavicular lymphadenopathy.  NEUROLOGIC: Patient is oriented x3 with no focal or lateralizing neurologic deficits.  PSYCH: Patients affect is appropriate, there is no evidence of anxiety or depression.  SKIN: Warm and dry; no lesions or wounds.   DATA REVIEW  ECG:***  ECHO:***  CATH:***   ASSESSMENT & PLAN:  *** Etiology of HF:*** NYHA class / AHA Stage:*** Volume status & Diuretics: *** Vasodilators:*** Beta-Blocker:*** MRA:*** Cardiometabolic:*** Devices therapies & Valvulopathies:*** Advanced therapies:***  Follow-up:  No follow-ups on file.   Breylan Lefevers Advanced Heart Failure Mechanical Circulatory Support

## 2022-06-13 NOTE — Patient Instructions (Addendum)
START Lasix 40 mg daily.  Labs done today, your results will be available in MyChart, we will contact you for abnormal readings.  Your physician has requested that you have an echocardiogram. Echocardiography is a painless test that uses sound waves to create images of your heart. It provides your doctor with information about the size and shape of your heart and how well your heart's chambers and valves are working. This procedure takes approximately one hour. There are no restrictions for this procedure. Please do NOT wear cologne, perfume, aftershave, or lotions (deodorant is allowed). Please arrive 15 minutes prior to your appointment time.  Your physician has requested that you have a cardiac MRI. Cardiac MRI uses a computer to create images of your heart as its beating, producing both still and moving pictures of your heart and major blood vessels. For further information please visit [email or web address] Please follow the instruction sheet given to you today for more information. ONCE APPROVED BY YOUR INSURANCE COMPANY YOU WILL BE CALLED TO HAVE THE TEST ARRANGED   Your physician recommends that you schedule a follow-up appointment in: 1 month  If you have any questions or concerns before your next appointment please send [place] a message through [place] or call our office at [phone].    TO LEAVE A MESSAGE FOR THE NURSE SELECT OPTION 2, PLEASE LEAVE A MESSAGE INCLUDING: YOUR NAME DATE OF BIRTH CALL BACK NUMBER REASON FOR CALL**this is important as we prioritize the call backs  YOU WILL RECEIVE A CALL BACK THE SAME DAY AS LONG AS YOU CALL BEFORE 4:00 PM  At the [facility], you and your health needs are our priority. As part of our continuing mission to provide you with exceptional heart care, we have created designated Provider Care Teams. These Care Teams include your primary Cardiologist (physician) and Advanced Practice Providers (APPs- Physician Assistants and  Nurse Practitioners) who all work together to provide you with the care you need, when you need it.   You may see any of the following providers on your designated Care Team at your next follow up: Dr Glori Bickers Dr Loralie Champagne Dr. Roxana Hires, NP Lyda Jester, [place] [facility] [place], [place] Forestine Na, NP Audry Riles, PharmD   Please be sure to bring in all your medications bottles to every appointment.    Thank you for choosing [facility]

## 2022-06-14 LAB — HIV-1 RNA QUANT-NO REFLEX-BLD
HIV 1 RNA Quant: <20 copies/mL
LOG10 HIV-1 RNA: UNDETERMINED log10copy/mL

## 2022-06-14 LAB — HCV RNA QUANT: HCV Quantitative: NOT DETECTED IU/mL (ref >50)

## 2022-06-14 LAB — ANA W/REFLEX: Anti Nuclear Antibody (ANA): NEGATIVE

## 2022-06-15 ENCOUNTER — Telehealth (HOSPITAL_COMMUNITY): Payer: Self-pay

## 2022-06-15 DIAGNOSIS — I272 Pulmonary hypertension, unspecified: Secondary | ICD-10-CM

## 2022-06-15 MED ORDER — FUROSEMIDE 20 MG PO TABS: 40.0000 mg | ORAL_TABLET | Freq: Two times a day (BID) | ORAL | 3 refills | Status: DC

## 2022-06-15 NOTE — Telephone Encounter (Signed)
Pt aware, agreeable, and verbalized understanding  Labs order in. Med list updated   Sandra Schneider has very severe pulmonary hypertension. Can we increase her lasix to '40mg'$  BID, repeat BNP/BMP in 1 week? Once she is more euvolemic, I plan to start her on sildenafil '20mg'$  TID, repeat RHC and quickly get her on triple therapy.   Thank you,   Adi

## 2022-06-22 ENCOUNTER — Other Ambulatory Visit: Payer: Self-pay | Admitting: Cardiology

## 2022-06-22 ENCOUNTER — Other Ambulatory Visit (HOSPITAL_COMMUNITY)
Admission: RE | Admit: 2022-06-22 | Discharge: 2022-06-22 | Disposition: A | Discharge status: Home / Self Care | Payer: Medicare Other | Source: Ambulatory Visit | Attending: Cardiology | Admitting: Cardiology

## 2022-06-22 DIAGNOSIS — R0602 Shortness of breath: Secondary | ICD-10-CM | POA: Insufficient documentation

## 2022-06-22 DIAGNOSIS — I272 Pulmonary hypertension, unspecified: Secondary | ICD-10-CM | POA: Insufficient documentation

## 2022-06-22 LAB — BRAIN NATRIURETIC PEPTIDE: B Natriuretic Peptide: 1086 pg/mL — ABNORMAL HIGH (ref 0.0–100.0)

## 2022-06-22 LAB — BASIC METABOLIC PANEL
Anion gap: 12 (ref 5–15)
BUN: 38 mg/dL — ABNORMAL HIGH (ref 8–23)
CO2: 33 mmol/L — ABNORMAL HIGH (ref 22–32)
Calcium: 9.1 mg/dL (ref 8.9–10.3)
Chloride: 92 mmol/L — ABNORMAL LOW (ref 98–111)
Creatinine, Ser: 1.44 mg/dL — ABNORMAL HIGH (ref 0.44–1.00)
GFR, Estimated: 40 mL/min — ABNORMAL LOW (ref >60)
Glucose, Bld: 103 mg/dL — ABNORMAL HIGH (ref 70–99)
Potassium: 3.6 mmol/L (ref 3.5–5.1)
Sodium: 137 mmol/L (ref 135–145)

## 2022-06-23 ENCOUNTER — Telehealth (HOSPITAL_COMMUNITY): Payer: Self-pay | Admitting: Cardiology

## 2022-06-23 MED ORDER — SILDENAFIL CITRATE 20 MG PO TABS: 20.0000 mg | ORAL_TABLET | Freq: Three times a day (TID) | ORAL | 3 refills | Status: DC

## 2022-06-23 MED ORDER — FUROSEMIDE 20 MG PO TABS: 20.0000 mg | ORAL_TABLET | Freq: Two times a day (BID) | ORAL | 3 refills | Status: DC

## 2022-06-23 NOTE — Telephone Encounter (Signed)
-----   Message from Pleak, DO sent at 06/22/2022  4:48 PM EST ----- Ms. Blincoe's creatinine is starting to rise. I suspect this is because she is no longer volume overloaded. Can we touch base with her and ask her to decrease her lasix by half. If she reports that her edema is better lets start her on sildenafil '20mg'$  TID and schedule a RHC in 1 month.   Thank you,   Adi

## 2022-06-23 NOTE — Telephone Encounter (Signed)
Patient called.  Patient aware. Pt wold like to arrange procedure at follow up 3/12

## 2022-07-03 ENCOUNTER — Telehealth (HOSPITAL_COMMUNITY): Payer: Self-pay

## 2022-07-03 ENCOUNTER — Other Ambulatory Visit (HOSPITAL_COMMUNITY): Payer: Self-pay

## 2022-07-03 NOTE — Telephone Encounter (Signed)
Advanced Heart Failure Patient Advocate Encounter  Prior authorization is required for Sildenafil (Revatio). PA submitted and APPROVED on 07/03/22.  Key [id number] Effective: 07/03/22 - 05/15/23  Test billing returns $45.26 copay for 30 day supply.  Clista Bernhardt, CPhT Rx Patient Advocate Phone: [phone]

## 2022-07-07 ENCOUNTER — Ambulatory Visit: Payer: Medicare Other

## 2022-07-07 DIAGNOSIS — G4733 Obstructive sleep apnea (adult) (pediatric): Secondary | ICD-10-CM

## 2022-07-07 DIAGNOSIS — R0683 Snoring: Secondary | ICD-10-CM

## 2022-07-19 ENCOUNTER — Telehealth: Payer: Self-pay | Admitting: Pulmonary Disease

## 2022-07-19 DIAGNOSIS — G4733 Obstructive sleep apnea (adult) (pediatric): Secondary | ICD-10-CM

## 2022-07-19 NOTE — Telephone Encounter (Signed)
HST showed severe  OSA with AHI 40/ hr and lowest: 50%! Schedule CPAP titration study -she may need BiPAP or oxygen in addition  Meantime, proceed with  autoCPAP  5-15 cm, mask of choice OV with me in 6 wks after study

## 2022-07-19 NOTE — Telephone Encounter (Signed)
Called and informed patient of her results and she voiced understanding.  She is agreeable to CPAP titration and starting CPAP.  Orders placed.  Nothing further needed at this time.

## 2022-07-25 ENCOUNTER — Ambulatory Visit (HOSPITAL_BASED_OUTPATIENT_CLINIC_OR_DEPARTMENT_OTHER)
Admission: RE | Admit: 2022-07-25 | Discharge: 2022-07-25 | Disposition: A | Discharge status: Home / Self Care | Payer: Medicare Other | Source: Ambulatory Visit | Attending: Cardiology | Admitting: Cardiology

## 2022-07-25 ENCOUNTER — Encounter (HOSPITAL_COMMUNITY): Payer: Self-pay | Admitting: Cardiology

## 2022-07-25 ENCOUNTER — Other Ambulatory Visit (HOSPITAL_COMMUNITY): Payer: Self-pay

## 2022-07-25 ENCOUNTER — Ambulatory Visit (HOSPITAL_COMMUNITY)
Admission: RE | Admit: 2022-07-25 | Discharge: 2022-07-25 | Disposition: A | Discharge status: Home / Self Care | Payer: Medicare Other | Source: Ambulatory Visit | Attending: Cardiology | Admitting: Cardiology

## 2022-07-25 VITALS — BP 110/70 | HR 65 | Wt 296.2 lb

## 2022-07-25 DIAGNOSIS — I081 Rheumatic disorders of both mitral and tricuspid valves: Secondary | ICD-10-CM | POA: Diagnosis not present

## 2022-07-25 DIAGNOSIS — Z955 Presence of coronary angioplasty implant and graft: Secondary | ICD-10-CM | POA: Diagnosis not present

## 2022-07-25 DIAGNOSIS — I2510 Atherosclerotic heart disease of native coronary artery without angina pectoris: Secondary | ICD-10-CM | POA: Diagnosis not present

## 2022-07-25 DIAGNOSIS — I252 Old myocardial infarction: Secondary | ICD-10-CM | POA: Insufficient documentation

## 2022-07-25 DIAGNOSIS — Z6841 Body Mass Index (BMI) 40.0 and over, adult: Secondary | ICD-10-CM | POA: Diagnosis not present

## 2022-07-25 DIAGNOSIS — Z79899 Other long term (current) drug therapy: Secondary | ICD-10-CM | POA: Insufficient documentation

## 2022-07-25 DIAGNOSIS — G4733 Obstructive sleep apnea (adult) (pediatric): Secondary | ICD-10-CM | POA: Diagnosis not present

## 2022-07-25 DIAGNOSIS — R600 Localized edema: Secondary | ICD-10-CM | POA: Insufficient documentation

## 2022-07-25 DIAGNOSIS — I272 Pulmonary hypertension, unspecified: Secondary | ICD-10-CM

## 2022-07-25 DIAGNOSIS — I219 Acute myocardial infarction, unspecified: Secondary | ICD-10-CM | POA: Diagnosis not present

## 2022-07-25 DIAGNOSIS — I10 Essential (primary) hypertension: Secondary | ICD-10-CM | POA: Diagnosis not present

## 2022-07-25 DIAGNOSIS — E785 Hyperlipidemia, unspecified: Secondary | ICD-10-CM | POA: Insufficient documentation

## 2022-07-25 LAB — BASIC METABOLIC PANEL
Anion gap: 11 (ref 5–15)
BUN: 41 mg/dL — ABNORMAL HIGH (ref 8–23)
CO2: 29 mmol/L (ref 22–32)
Calcium: 9.7 mg/dL (ref 8.9–10.3)
Chloride: 96 mmol/L — ABNORMAL LOW (ref 98–111)
Creatinine, Ser: 1.28 mg/dL — ABNORMAL HIGH (ref 0.44–1.00)
GFR, Estimated: 46 mL/min — ABNORMAL LOW (ref >60)
Glucose, Bld: 55 mg/dL — ABNORMAL LOW (ref 70–99)
Potassium: 4.1 mmol/L (ref 3.5–5.1)
Sodium: 136 mmol/L (ref 135–145)

## 2022-07-25 LAB — ECHOCARDIOGRAM COMPLETE BUBBLE STUDY
AR max vel: 2.41 cm2
AV Area VTI: 2.44 cm2
AV Area mean vel: 2.44 cm2
AV Mean grad: 4 mmHg
AV Peak grad: 9 mmHg
Ao pk vel: 1.5 m/s
Area-P 1/2: 1.86 cm2
MV VTI: 1.8 cm2
S' Lateral: 2 cm

## 2022-07-25 LAB — BRAIN NATRIURETIC PEPTIDE: B Natriuretic Peptide: 1154.7 pg/mL — ABNORMAL HIGH (ref 0.0–100.0)

## 2022-07-25 MED ORDER — POTASSIUM CHLORIDE ER 10 MEQ PO TBCR: EXTENDED_RELEASE_TABLET | ORAL | 3 refills | Status: DC

## 2022-07-25 MED ORDER — FUROSEMIDE 20 MG PO TABS: 40.0000 mg | ORAL_TABLET | Freq: Two times a day (BID) | ORAL | 3 refills | Status: DC

## 2022-07-25 MED ORDER — POTASSIUM CHLORIDE ER 10 MEQ PO TBCR: 10.0000 meq | EXTENDED_RELEASE_TABLET | ORAL | 3 refills | Status: DC | PRN

## 2022-07-25 NOTE — Progress Notes (Signed)
*  PRELIMINARY RESULTS* Echocardiogram 2D Echocardiogram has been performed.  Sandra Schneider 07/25/2022, 10:32 AM

## 2022-07-25 NOTE — Patient Instructions (Addendum)
CHANGE Lasix to 60 mg in the morning and 40 mg in the evening for 4 days, then change to 40 mg Twice daily   TAKE 40 of Potassium ( 4 Tabs) for 4 days.  Labs done today, your results will be available in MyChart, we will contact you for abnormal readings.  Your physician recommends that you schedule a follow-up appointment in: 3 weeks  If you have any questions or concerns before your next appointment please send [place] a message through [place] or call our office at [phone].    TO LEAVE A MESSAGE FOR THE NURSE SELECT OPTION 2, PLEASE LEAVE A MESSAGE INCLUDING: YOUR NAME DATE OF BIRTH CALL BACK NUMBER REASON FOR CALL**this is important as we prioritize the call backs  YOU WILL RECEIVE A CALL BACK THE SAME DAY AS LONG AS YOU CALL BEFORE 4:00 PM  At the [facility], you and your health needs are our priority. As part of our continuing mission to provide you with exceptional heart care, we have created designated Provider Care Teams. These Care Teams include your primary Cardiologist (physician) and Advanced Practice Providers (APPs- Physician Assistants and Nurse Practitioners) who all work together to provide you with the care you need, when you need it.   You may see any of the following providers on your designated Care Team at your next follow up: Dr Glori Bickers Dr Loralie Champagne Dr. Roxana Hires, NP Lyda Jester, [place] [facility] [place], [place] Forestine Na, NP Audry Riles, PharmD   Please be sure to bring in all your medications bottles to every appointment.    Thank you for choosing [facility]

## 2022-07-25 NOTE — Progress Notes (Addendum)
ADVANCED HEART FAILURE CLINIC NOTE  Referring Physician: [facility], [place]: [facility], [facility] Primary Cardiologist: Dr. Carlyle Dolly  HPI: Sandra Schneider is a 67 y.o. female with CAD (NSTEMI 06/2012 w/ PCI to LAD and Lcx) LVEF 55% at that time, OSA, HTN, hyperlipidemia, hx of breast ca s/p lumpectomy and persistent shortness of breath and lower extremity edema presenting today for evaluation of pulmonary hypertension. She also follows Dr. Elsworth Soho for OSA.  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  She has noticed that over the past several months she has had worsening lower extremity edema and difficulty ambulating due to shortness of breath.  She had a echocardiogram concerning for pulmonary hypertension followed by right heart cath with a mean PA of 69 mmHg.  Her cardiac output was 7 L/min during that case.  She had a workup for interstitial lung disease by pulmonology with CT chest that was negative.  However there was a right upper lobe lung nodule with negative PET scan.  In addition she has had a colonoscopy and mammogram due to hypermetabolic areas in the colon and breast both of which were also negative.  Interval hx:  During our last appointment, Sandra Schneider was significantly hypervolemic. We diuresed her with lasix '40mg'$  BID and started sildenafil. She reports improvement in ability to ambulate after starting sildenafil. Unfortuantely, after reducing lasix dose she has become significantly hypervolemic with edema to the thighs and abdomen. Despite this she feels the sildenafil has still improved her overall functional status. Currently taking lasix '20mg'$  daily.   Activity level/exercise tolerance: NYHA III, improving, however severely volume overloaded.  Orthopnea:  Sleeps on 3 pillows Paroxysmal noctural dyspnea:  No Chest pain/pressure:  No Orthostatic lightheadedness:  No Palpitations:  No Lower  extremity edema:  Yes Presyncope/syncope:  No Cough:  Infrequent but yes  Past Medical History:  Diagnosis Date   Breast cancer ([facility])    Coronary artery disease    Diabetes mellitus    GERD (gastroesophageal reflux disease)    Hypertension    Myocardial abscess    Myocardial infarct ([facility]) 06/24/2012   Pulmonary hypertension ([facility])     Current Outpatient Medications  Medication Sig Dispense Refill   amLODipine (NORVASC) 10 MG tablet Take 1 tablet by mouth once daily 90 tablet 0   Ascorbic Acid (VITAMIN C PO) Take 1 tablet by mouth daily.     aspirin EC 81 MG tablet Take 81 mg by mouth daily.     atorvastatin (LIPITOR) 40 MG tablet Take 1 tablet by mouth once daily 90 tablet 0   chlorthalidone (HYGROTON) 25 MG tablet Take 1 tablet by mouth once daily 90 tablet 3   Cholecalciferol (VITAMIN D) 50 MCG (2000 UT) CAPS Take 2,000 Units by mouth daily.     dorzolamide-timolol (COSOPT) 22.3-6.8 MG/ML ophthalmic solution Place 1 drop into both eyes 2 (two) times daily.     famotidine (PEPCID) 20 MG tablet Take 20 mg by mouth daily.     furosemide (LASIX) 20 MG tablet Take 1 tablet (20 mg total) by mouth 2 (two) times daily. 60 tablet 3   glipiZIDE (GLUCOTROL) 10 MG tablet Take 10 mg by mouth 2 (two) times daily before a meal.     letrozole (FEMARA) 2.5 MG tablet Take 2.5 mg by mouth daily.     lisinopril (ZESTRIL) 40 MG tablet Take 1 tablet by mouth once daily 90 tablet 3   loratadine (CLARITIN) 10 MG  tablet Take 10 mg by mouth daily.     metFORMIN (GLUCOPHAGE) 1000 MG tablet Take 1 tablet (1,000 mg total) by mouth 2 (two) times daily.     metoprolol tartrate (LOPRESSOR) 50 MG tablet Take 50 mg by mouth 2 (two) times daily.     Multiple Vitamin (MULTIVITAMIN) tablet Take 1 tablet by mouth daily.     sildenafil (REVATIO) 20 MG tablet Take 1 tablet (20 mg total) by mouth 3 (three) times daily. 90 tablet 3   Current Facility-Administered Medications  Medication Dose Route Frequency Provider  Last Rate Last Admin   sodium chloride flush (NS) 0.9 % injection 3 mL  3 mL Intravenous Q12H Branch, Alphonse Guild, MD        No Known Allergies    Social History   Socioeconomic History   Marital status: Widowed    Spouse name: Not on file   Number of children: Not on file   Years of education: Not on file   Highest education level: Not on file  Occupational History   Not on file  Tobacco Use   Smoking status: Never    Passive exposure: Never   Smokeless tobacco: Never  Vaping Use   Vaping Use: Never used  Substance and Sexual Activity   Alcohol use: No   Drug use: No   Sexual activity: Yes    Birth control/protection: Post-menopausal  Other Topics Concern   Not on file  Social History Narrative   Not on file   Social Determinants of Health   Financial Resource Strain: Not on file  Food Insecurity: Not on file  Transportation Needs: Not on file  Physical Activity: Not on file  Stress: Not on file  Social Connections: Not on file  Intimate Partner Violence: Not on file      Family History  Problem Relation Age of Onset   Heart disease Father    Diabetes Other    Hypertension Other    Cancer Other    Colon cancer Son 22       alive, doing well.    PHYSICAL EXAM: Vitals:   07/25/22 1030  BP: 110/70  Pulse: 65  SpO2: 90%   GENERAL: Well nourished, well developed, and in no apparent distress at rest.  HEENT: Negative for arcus senilis or xanthelasma. There is no scleral icterus.  The mucous membranes are pink and moist.   NECK: Supple, No masses. Normal carotid upstrokes without bruits. No masses or thyromegaly.    CHEST: There are no chest wall deformities. There is no chest wall tenderness. Respirations are unlabored.  Lungs- decreased lung sounds b/l CARDIAC:  JVP: difficult to assess, however, appears elevated to midnec k         Normal rate with regular rhythm. No murmurs, rubs or gallops.  Pulses are 2+ and symmetrical in upper and lower  extremities. 3+ edema. Pitting edema in the thighs.  ABDOMEN: Soft, non-tender, non-distended. There are no masses or hepatomegaly. There are normal bowel sounds.  EXTREMITIES: Warm and well perfused with no cyanosis, clubbing.  LYMPHATIC: No axillary or supraclavicular lymphadenopathy.  NEUROLOGIC: Patient is oriented x3 with no focal or lateralizing neurologic deficits.  PSYCH: Patients affect is appropriate, there is no evidence of anxiety or depression.  SKIN: Warm and dry; no lesions or wounds.    DATA REVIEW  ECG:06/13/22: NSR with 1AVB  ECHO: 01/18/22: LVBEF 60-65%. Moderately dilated RV with mild reduction in function. Dilated RA.  CATH: 02/08/23:   Prox Cx  to Dist Cx lesion is 25% stenosed.   Mid LAD lesion is 25% stenosed.   2nd Mrg lesion is 100% stenosed.  Right to left collaterals.  Left to left collaterals.   The left ventricular systolic function is normal.   LV end diastolic pressure is mildly elevated.   The left ventricular ejection fraction is greater than 65% by visual estimate.   Hemodynamic findings consistent with severe pulmonary hypertension.   There is no aortic valve stenosis.   Aortic saturation 96% on 3 L nasal cannula, PA saturation 69% on 3 L, mean RA pressure 32 mm Hg; PA pressure 107/48, mean PA pressure 69 mmHg, pulmonary capillary wedge pressure difficult to assess due to significant V waves, and difficulty wedging.  Cardiac output 7.03 L/min, cardiac index 3.07.   Patent stents with mild restenosis in the LAD and circumflex.  Small obtuse marginal appears occluded distally with some right to left collaterals.  No target for PCI.  PFTS: 03/21/22: - No obstructive lung disease.   ASSESSMENT & PLAN:  Pulmonary hypertension - RHC waveforms reviewed personally. RA 30, RV 107/20-30, PA 107/48 (69), unable to wedge; LVEDP 15. AO 140/64. LVEDP ~16. Assuming a PCWP of 20, TPG would be 49 w/ PVR of 7.  - Hemodynamics consistent with severe pulmonary  hypertension largely due to pre-capillary PH.  - Labs: ANA, HCV negative. HIV pending. LFTs mostly unremarkable.  - PFTs w/ moderate restriction.  - BNP previously elevated.  - CT chest w/ pulmonary nodules, however, follow up PET mostly unremarkable.  - Started sildenafil '20mg'$  TID previously; however, now severely volume overloaded. Despite this she reports being able to walk further with the addition of sildenafil. Today we will diurese aggressively; lasix 60 qAM and '40mg'$  PM x 4 days with 26mq KCL then '40mg'$  BID. Repeat labs in 1 week. Close follow up in 3 weeks to re-assess. REDs 49% today.   2. OSA - followed by pulmonology.  - Severe on recent sleep study; planning on repeat with BIPAP.   3. CAD - LHC as above - followed by general cardiology.   4. Severe obesity - BMI 48.54 today - discussed importance of weight loss.   LHC, RHC and all imaging personally reviewed.   Selisa Tensley Advanced Heart Failure Mechanical Circulatory Support

## 2022-07-25 NOTE — Progress Notes (Signed)
ReDS Vest / Clip - 07/25/22 1000       ReDS Vest / Clip   Station Marker B    Ruler Value 37    ReDS Value Range High volume overload    ReDS Actual Value 49

## 2022-08-01 ENCOUNTER — Other Ambulatory Visit: Payer: Self-pay | Admitting: Cardiology

## 2022-08-04 ENCOUNTER — Telehealth (HOSPITAL_COMMUNITY): Payer: Self-pay

## 2022-08-04 ENCOUNTER — Other Ambulatory Visit (HOSPITAL_COMMUNITY): Payer: Self-pay

## 2022-08-04 MED ORDER — FUROSEMIDE 20 MG PO TABS: 40.0000 mg | ORAL_TABLET | Freq: Two times a day (BID) | ORAL | 3 refills | Status: DC

## 2022-08-04 NOTE — Telephone Encounter (Signed)
I spoke with Sandra Schneider and went over med changes. She verbalized understanding.

## 2022-08-04 NOTE — Telephone Encounter (Signed)
She called back and stated she is not having any shortness of breath, but her stomach is very swollen and feels hard. She had no appetite yesterday. It sounds like there is some constipation involved. She did not go yesterday at all.

## 2022-08-04 NOTE — Telephone Encounter (Signed)
Patient left message about increased abdominal swelling and increased swelling in her legs. I tried to reach her to get more details, but no answer. She did say her legs felt very tight as well.

## 2022-08-09 ENCOUNTER — Telehealth: Payer: Self-pay

## 2022-08-09 ENCOUNTER — Ambulatory Visit: Payer: Medicare Other | Attending: Cardiology | Admitting: Cardiology

## 2022-08-09 ENCOUNTER — Encounter: Payer: Self-pay | Admitting: Cardiology

## 2022-08-09 ENCOUNTER — Ambulatory Visit: Payer: Medicare Other | Admitting: Cardiology

## 2022-08-09 ENCOUNTER — Other Ambulatory Visit (HOSPITAL_COMMUNITY)
Admission: RE | Admit: 2022-08-09 | Discharge: 2022-08-09 | Disposition: A | Discharge status: Home / Self Care | Payer: Medicare Other | Source: Ambulatory Visit | Attending: Cardiology | Admitting: Cardiology

## 2022-08-09 VITALS — BP 140/70 | HR 63 | Ht 65.0 in | Wt 298.0 lb

## 2022-08-09 DIAGNOSIS — I10 Essential (primary) hypertension: Secondary | ICD-10-CM | POA: Diagnosis not present

## 2022-08-09 DIAGNOSIS — E785 Hyperlipidemia, unspecified: Secondary | ICD-10-CM

## 2022-08-09 DIAGNOSIS — I2510 Atherosclerotic heart disease of native coronary artery without angina pectoris: Secondary | ICD-10-CM | POA: Diagnosis not present

## 2022-08-09 DIAGNOSIS — I272 Pulmonary hypertension, unspecified: Secondary | ICD-10-CM

## 2022-08-09 LAB — BASIC METABOLIC PANEL
Anion gap: 11 (ref 5–15)
BUN: 45 mg/dL — ABNORMAL HIGH (ref 8–23)
CO2: 28 mmol/L (ref 22–32)
Calcium: 9.4 mg/dL (ref 8.9–10.3)
Chloride: 96 mmol/L — ABNORMAL LOW (ref 98–111)
Creatinine, Ser: 1.31 mg/dL — ABNORMAL HIGH (ref 0.44–1.00)
GFR, Estimated: 44 mL/min — ABNORMAL LOW (ref >60)
Glucose, Bld: 81 mg/dL (ref 70–99)
Potassium: 4 mmol/L (ref 3.5–5.1)
Sodium: 135 mmol/L (ref 135–145)

## 2022-08-09 LAB — LIPID PANEL
Cholesterol: 96 mg/dL (ref 0–200)
HDL: 40 mg/dL — ABNORMAL LOW (ref >40)
LDL Cholesterol: 44 mg/dL (ref 0–99)
Total CHOL/HDL Ratio: 2.4 RATIO
Triglycerides: 60 mg/dL (ref <150)
VLDL: 12 mg/dL (ref 0–40)

## 2022-08-09 LAB — MAGNESIUM: Magnesium: 2 mg/dL (ref 1.7–2.4)

## 2022-08-09 MED ORDER — FUROSEMIDE 20 MG PO TABS: ORAL_TABLET | ORAL | 3 refills | Status: DC

## 2022-08-09 NOTE — Progress Notes (Signed)
Clinical Summary Ms. Whittlesey is a 67 y.o.female seen today for follow up of the following medical problems   1. CAD - history of NSTEMI 06/2012, received DES to prox LAD and DES to mid LCX - 06/2012 echo LVEF 55-60%, grade I DDx       - 01/2022 echo LVEF 65-70%, no WMAs, grade I dd, mild RV dysfunction, mod to severely enlarged RV, severe pulm HTN PASP 80, mild mitrla stenosis,  - 01/2022 cath: LAD 25%, LCX 25%, OM2 occluded, RCA LIs - no recent symptoms   2. Pulmonary HTN/RV failure - 01/2022 echo LVEF 65-70%, no WMAs, grade I dd, mild RV dysfunction, mod to severely enlarged RV, severe pulm HTN PASP 80, mild mitrla stenosis,    01/2022 RHC/LHC:  No significant CAD. Mean PA 69, PCWP pulmonary capillary wedge pressure difficult to assess due to significant V waves, and difficulty wedging.LVEDP 16   02/2022 VQ scan: no PE CT chest: 0.9 cm apical RUL nodule, Nonspecific mild right paratracheal and right subcarinal lymphadenopathy. Primary bronchogenic malignancy with nodal metastatic disease not excluded. Suggest PET-CT for further characterization of these findings. PFTs mod restriction OSA: severe OSA on sleep study, followed by Dr Elsworth Soho  07/2022 echo: LVEF 60-65%, grade I dd, sever eRV dysfunction - followed by HF clinic - managed for precapillary pulm HTN - started on sildenafil - evidence of volume overload by 07/25/22 HF clinic visit - plans for lasix 60mg  in AM and 40mg  pm x 4 days, then 40mg  bid  - LE edema improved, increased abdominal distension - 3/12 Wt 296, today 298 lbs.  - has been taking 60mg  bid over the last week.      2.Thyroid nodule     3. HTN - has not taken meds yet.        3. Hyperlipidemia - labs followed by pcp - she is on atorvastatin 40mg  daily   -needs updated cholesterol panel      4. Breast cancer - recent lumpectomy - reports just being monitored at this time.    5. 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  Past Medical History:  Diagnosis Date   Breast cancer ([place])    Coronary artery disease    Diabetes mellitus    GERD (gastroesophageal reflux disease)    Hypertension    Myocardial abscess    Myocardial infarct ([place]) 06/24/2012   Pulmonary hypertension ([facility])      No Known Allergies   Current Outpatient Medications  Medication Sig Dispense Refill   amLODipine (NORVASC) 10 MG tablet Take 1 tablet by mouth once daily 90 tablet 0   Ascorbic Acid (VITAMIN C PO) Take 1 tablet by mouth daily.     aspirin EC 81 MG tablet Take 81 mg by mouth daily.     atorvastatin (LIPITOR) 40 MG tablet Take 1 tablet by mouth once daily 90 tablet 0   chlorthalidone (HYGROTON) 25 MG tablet Take 1 tablet by mouth once daily 90 tablet 3   Cholecalciferol (VITAMIN D) 50 MCG (2000 UT) CAPS Take 2,000 Units by mouth daily.     dorzolamide-timolol (COSOPT) 22.3-6.8 MG/ML ophthalmic solution Place 1 drop into both eyes 2 (two) times daily.     famotidine (PEPCID) 20 MG tablet Take 20 mg by mouth daily.     furosemide (LASIX) 20 MG tablet Take 2 tablets (40 mg total) by mouth 2 (two) times daily. 200 tablet 3  glipiZIDE (GLUCOTROL) 10 MG tablet Take 10 mg by mouth 2 (two) times daily before a meal.     letrozole (FEMARA) 2.5 MG tablet Take 2.5 mg by mouth daily.     lisinopril (ZESTRIL) 40 MG tablet Take 1 tablet by mouth once daily 90 tablet 3   loratadine (CLARITIN) 10 MG tablet Take 10 mg by mouth daily.     metFORMIN (GLUCOPHAGE) 1000 MG tablet Take 1 tablet (1,000 mg total) by mouth 2 (two) times daily.     metoprolol tartrate (LOPRESSOR) 50 MG tablet Take 50 mg by mouth 2 (two) times daily.     Multiple Vitamin (MULTIVITAMIN) tablet Take 1 tablet by mouth daily.     potassium chloride (KLOR-CON) 10 MEQ tablet Take 4 tablets daily for 4 days then take 1 tablet daily. 60 tablet 3   sildenafil (REVATIO) 20 MG tablet Take 1 tablet (20 mg total) by mouth 3 (three)  times daily. 90 tablet 3   Current Facility-Administered Medications  Medication Dose Route Frequency Provider Last Rate Last Admin   sodium chloride flush (NS) 0.9 % injection 3 mL  3 mL Intravenous Q12H Arnoldo Lenis, MD         Past Surgical History:  Procedure Laterality Date   ANTERIOR VITRECTOMY Left 07/21/2019   Procedure: ANTERIOR VITRECTOMY;  Surgeon: Baruch Goldmann, MD;  Location: [facility];  Service: Ophthalmology;  Laterality: Left;   ANTERIOR VITRECTOMY Right 08/04/2019   Procedure: ANTERIOR VITRECTOMY;  Surgeon: Baruch Goldmann, MD;  Location: [facility];  Service: Ophthalmology;  Laterality: Right;   CARDIAC CATHETERIZATION     CATARACT EXTRACTION W/PHACO Left 07/21/2019   Procedure: CATARACT EXTRACTION PHACO AND INTRAOCULAR LENS PLACEMENT (IOC) (CDE: 11.45);  Surgeon: Baruch Goldmann, MD;  Location: [facility];  Service: Ophthalmology;  Laterality: Left;   CATARACT EXTRACTION W/PHACO Right 08/04/2019   Procedure: CATARACT EXTRACTION PHACO AND INTRAOCULAR LENS PLACEMENT (IOC);  Surgeon: Baruch Goldmann, MD;  Location: [facility];  Service: Ophthalmology;  Laterality: Right;  CDE: 9.78   CESAREAN SECTION     COLONOSCOPY WITH PROPOFOL N/A 05/22/2022   Procedure: COLONOSCOPY WITH PROPOFOL;  Surgeon: [name], DO;  Location: [facility];  Service: Endoscopy;  Laterality: N/A;  10:00 am   CORONARY STENT PLACEMENT N/A 06/24/2012   LEFT HEART CATHETERIZATION WITH CORONARY ANGIOGRAM N/A 06/24/2012   Procedure: LEFT HEART CATHETERIZATION WITH CORONARY ANGIOGRAM;  Surgeon: Jolaine Artist, MD;  Location: [facility];  Service: Cardiovascular;  Laterality: N/A;   right breast lumpectomy     RIGHT/LEFT HEART CATH AND CORONARY ANGIOGRAPHY N/A 02/07/2022   Procedure: RIGHT/LEFT HEART CATH AND CORONARY ANGIOGRAPHY;  Surgeon: Jettie Booze, MD;  Location: [place] [facility] [facility];  Service: Cardiovascular;  Laterality: N/A;     No Known Allergies    Family History  Problem Relation  Age of Onset   Heart disease Father    Diabetes Other    Hypertension Other    Cancer Other    Colon cancer Son 34       alive, doing well.     Social History Ms. Spizzirri reports that she has never smoked. She has never been exposed to tobacco smoke. She has never used smokeless tobacco. Ms. Bohner reports no history of alcohol use.   Review of Systems CONSTITUTIONAL: No weight loss, fever, chills, weakness or fatigue.  HEENT: Eyes: No visual loss, blurred vision, double vision or yellow sclerae.No hearing loss, sneezing, congestion, runny nose or sore throat.  SKIN:  No rash or itching.  CARDIOVASCULAR: per hpi RESPIRATORY: per hpi GASTROINTESTINAL: No anorexia, nausea, vomiting or diarrhea. No abdominal pain or blood.  GENITOURINARY: No burning on urination, no polyuria NEUROLOGICAL: No headache, dizziness, syncope, paralysis, ataxia, numbness or tingling in the extremities. No change in bowel or bladder control.  MUSCULOSKELETAL: No muscle, back pain, joint pain or stiffness.  LYMPHATICS: No enlarged nodes. No history of splenectomy.  PSYCHIATRIC: No history of depression or anxiety.  ENDOCRINOLOGIC: No reports of sweating, cold or heat intolerance. No polyuria or polydipsia.  Marland Kitchen   Physical Examination Today's Vitals   08/09/22 0836  BP: (!) 140/70  Pulse: 63  SpO2: 94%  Weight: 298 lb (135.2 kg)  Height: 5\' 5"  (1.651 m)   Body mass index is 49.59 kg/m.  Gen: resting comfortably, no acute distress HEENT: no scleral icterus, pupils equal round and reactive, no palptable cervical adenopathy,  CV: RRR, no m/rg, no jvd Resp: Clear to auscultation bilaterally GI: abdomen is soft, non-tender, +distended,  normal bowel sounds, no hepatosplenomegaly MSK: extremities are warm, trace bilateral LE edema Skin: warm, no rash Neuro:  no focal deficits Psych: appropriate affect   Diagnostic Studies  06/2012 echo Study Conclusions   - Left ventricle: The cavity size  was normal. Wall thickness    was normal. Systolic function was normal. The estimated    ejection fraction was in the range of 55% to 60%. Wall    motion was normal; there were no regional wall motion    abnormalities. Doppler parameters are consistent with    abnormal left ventricular relaxation (grade 1 diastolic    dysfunction). Doppler parameters are consistent with high    ventricular filling pressure.  - Mitral valve: Calcified annulus.  - Left atrium: The atrium was mildly dilated.  - Atrial septum: There was increased thickness of the    septum, consistent with lipomatous hypertrophy.      06/2012 cath     Findings:   Ao Pressure: 125/59 (83) LV Pressure:  137/11/20 There was no signficant gradient across the aortic valve on pullback.   Left main: Short. Normal   LAD: Large vessel with proximal 80% lesion. Mid vessel 30%. Distal vessel has diffuse moderate disease.    LCX: Large vessel gives off 3 OMs. 99% midsection followed by 40%. Distal vessel is small with 70% lesion.    RCA: Dominant. Normal.    LV-gram done in the RAO projection: Ejection fraction = 60%. No regional wall motion abnormalities.    Assessment: 1. 2V CAD 2. Normal LV function 3. NSTEMI   Plan/Discussion:   Plan PCI of LCX & LAD.     PCI Data: Vessel - proximal and mid left circumflex/Segment - proximal and mid Percent Stenosis (pre)  95% TIMI-flow 3 Stent 3.0 x 32 mm Promus drug-eluting stent postdilated with a 3.5 noncompliant balloon. Percent Stenosis (post) 0% TIMI-flow (post) 3     Vessel - LAD/Segment - proximal  Percent Stenosis (pre)  85% TIMI-flow 3 Stent 3.0 x 20 mm Promus drug-eluting stent postdilated with a 3.5 noncompliant balloon. Percent Stenosis (post) 0% TIMI-flow (post) 3   Final Conclusions:   Successful angioplasty and drug-eluting stent placement to the proximal/mid left circumflex as well as proximal left anterior descending artery.    Recommendations:   Recommend dual antiplatelet therapy for at least one year. Aggressive treatment of risk factors is recommended.     01/2022 echo 1. Left ventricular ejection fraction, by estimation, is 65 to 70%. The  left ventricle has normal function. The left ventricle has no regional  wall motion abnormalities. Left ventricular diastolic parameters are  consistent with Grade I diastolic  dysfunction (impaired relaxation).   2. Right ventricular systolic function is mildly reduced. The right  ventricular size is moderately to severely enlarged. There is severely  elevated pulmonary artery systolic pressure.   3. Left atrial size was mildly dilated.   4. Right atrial size was mildly dilated.   5. The mitral valve is abnormal. No evidence of mitral valve  regurgitation. Mild mitral stenosis. Moderate mitral annular  calcification.   6. The tricuspid valve is abnormal. Tricuspid valve regurgitation is mild  to moderate.   7. The aortic valve has an indeterminant number of cusps. Aortic valve  regurgitation is not visualized. No aortic stenosis is present.   8. The inferior vena cava is dilated in size with >50% respiratory  variability, suggesting right atrial pressure of 8 mmHg.    01/2022 RHC/LHC  Prox Cx to Dist Cx lesion is 25% stenosed.   Mid LAD lesion is 25% stenosed.   2nd Mrg lesion is 100% stenosed.  Right to left collaterals.  Left to left collaterals.   The left ventricular systolic function is normal.   LV end diastolic pressure is mildly elevated.   The left ventricular ejection fraction is greater than 65% by visual estimate.   Hemodynamic findings consistent with severe pulmonary hypertension.   There is no aortic valve stenosis.   Aortic saturation 96% on 3 L nasal cannula, PA saturation 69% on 3 L, mean RA pressure 32 mm Hg; PA pressure 107/48, mean PA pressure 69 mmHg, pulmonary capillary wedge pressure difficult to assess due to significant V waves, and difficulty wedging.   Cardiac output 7.03 L/min, cardiac index 3.07.   Patent stents with mild restenosis in the LAD and circumflex.  Small obtuse marginal appears occluded distally with some right to left collaterals.  No target for PCI.   Most likely her symptoms are coming from her severe pulmonary artery hypertension.  She states she will be starting CPAP therapy for obstructive sleep apnea soon.   07/2022 echo 1. Left ventricular ejection fraction, by estimation, is 60 to 65%. The  left ventricle has normal function. The left ventricle has no regional  wall motion abnormalities. Left ventricular diastolic parameters are  consistent with Grade I diastolic  dysfunction (impaired relaxation).   2. Right ventricular systolic function is severely reduced. The right  ventricular size is severely enlarged. There is severely elevated  pulmonary artery systolic pressure.   3. Left atrial size was mildly dilated.   4. Right atrial size was moderately dilated.   5. The mitral valve is abnormal. Trivial mitral valve regurgitation. No  evidence of mitral stenosis. Severe mitral annular calcification.   6. Tricuspid valve regurgitation is severe.   7. The aortic valve is tricuspid. There is mild calcification of the  aortic valve. Aortic valve regurgitation is not visualized. Aortic valve  sclerosis is present, with no evidence of aortic valve stenosis.   8. The inferior vena cava is dilated in size with <50% respiratory  variability, suggesting right atrial pressure of 15 mmHg.   9. Agitated saline contrast bubble study was negative, with no evidence  of any interatrial shunt.     Assessment and Plan  1.Pulmonary HTN/RV failure - followed by HF clinic, she is on sildenafil - recent issues with fluid overload, primarily abdominal distension - taking lasix 60mg  bid with some  improvement, repeat labs today. May adjust diuretic further pending results. Has f/u with HF clinic next week   2. CAD - no symptoms -  recent cath as reported above - continue current meds  3. Hyperlipidemia - repeat lipid panel       Arnoldo Lenis, M.D.

## 2022-08-09 NOTE — Patient Instructions (Signed)
Medication Instructions:  Your physician recommends that you continue on your current medications as directed. Please refer to the Current Medication list given to you today.  *If you need a refill on your cardiac medications before your next appointment, please call your pharmacy*   Lab Work: BMET MAG Fasting Lipid Panel  If you have labs (blood work) drawn today and your tests are completely normal, you will receive your results only by: [place] (if you have MyChart) OR A paper copy in the mail If you have any lab test that is abnormal or we need to change your treatment, we will call you to review the results.   Testing/Procedures: None   Follow-Up: At [facility], you and your health needs are our priority.  As part of our continuing mission to provide you with exceptional heart care, we have created designated Provider Care Teams.  These Care Teams include your primary Cardiologist (physician) and Advanced Practice Providers (APPs -  Physician Assistants and Nurse Practitioners) who all work together to provide you with the care you need, when you need it.  We recommend signing up for the patient portal called "MyChart".  Sign up information is provided on this After Visit Summary.  MyChart is used to connect with patients for Virtual Visits (Telemedicine).  Patients are able to view lab/test results, encounter notes, upcoming appointments, etc.  Non-urgent messages can be sent to your provider as well.   To learn more about what you can do with MyChart, go to [email or web address].    Your next appointment:   4 month(s)  Provider:   Carlyle Dolly, MD    Other Instructions

## 2022-08-09 NOTE — Telephone Encounter (Signed)
-----   Message from Arnoldo Lenis, MD sent at 08/09/2022 11:46 AM EDT ----- Kidney function stable, would increase her AM lasix to 80mg  and continue pm 60mg  until her f/u with HF clinic next week. Cholesterol looks great  Zandra Abts MD

## 2022-08-09 NOTE — Telephone Encounter (Signed)
Patient notified and verbalized understanding. Patient had no questions or concerns at this time.  

## 2022-08-11 DIAGNOSIS — K6289 Other specified diseases of anus and rectum: Secondary | ICD-10-CM | POA: Diagnosis not present

## 2022-08-11 DIAGNOSIS — K648 Other hemorrhoids: Secondary | ICD-10-CM | POA: Diagnosis not present

## 2022-08-15 ENCOUNTER — Encounter (HOSPITAL_COMMUNITY): Payer: Self-pay | Admitting: Cardiology

## 2022-08-15 ENCOUNTER — Telehealth (HOSPITAL_COMMUNITY): Payer: Self-pay

## 2022-08-15 ENCOUNTER — Ambulatory Visit (HOSPITAL_COMMUNITY)
Admission: RE | Admit: 2022-08-15 | Discharge: 2022-08-15 | Disposition: A | Discharge status: Home / Self Care | Payer: Medicare Other | Source: Ambulatory Visit | Attending: Cardiology | Admitting: Cardiology

## 2022-08-15 VITALS — BP 140/70 | HR 71 | Wt 295.6 lb

## 2022-08-15 DIAGNOSIS — Z8249 Family history of ischemic heart disease and other diseases of the circulatory system: Secondary | ICD-10-CM | POA: Diagnosis not present

## 2022-08-15 DIAGNOSIS — Z79899 Other long term (current) drug therapy: Secondary | ICD-10-CM | POA: Insufficient documentation

## 2022-08-15 DIAGNOSIS — I272 Pulmonary hypertension, unspecified: Secondary | ICD-10-CM

## 2022-08-15 DIAGNOSIS — I10 Essential (primary) hypertension: Secondary | ICD-10-CM | POA: Insufficient documentation

## 2022-08-15 DIAGNOSIS — R0602 Shortness of breath: Secondary | ICD-10-CM | POA: Insufficient documentation

## 2022-08-15 DIAGNOSIS — R600 Localized edema: Secondary | ICD-10-CM | POA: Insufficient documentation

## 2022-08-15 DIAGNOSIS — I2510 Atherosclerotic heart disease of native coronary artery without angina pectoris: Secondary | ICD-10-CM | POA: Insufficient documentation

## 2022-08-15 DIAGNOSIS — G4733 Obstructive sleep apnea (adult) (pediatric): Secondary | ICD-10-CM | POA: Insufficient documentation

## 2022-08-15 DIAGNOSIS — E785 Hyperlipidemia, unspecified: Secondary | ICD-10-CM | POA: Diagnosis not present

## 2022-08-15 DIAGNOSIS — Z6841 Body Mass Index (BMI) 40.0 and over, adult: Secondary | ICD-10-CM | POA: Diagnosis not present

## 2022-08-15 DIAGNOSIS — Z955 Presence of coronary angioplasty implant and graft: Secondary | ICD-10-CM | POA: Diagnosis not present

## 2022-08-15 DIAGNOSIS — Z853 Personal history of malignant neoplasm of breast: Secondary | ICD-10-CM | POA: Insufficient documentation

## 2022-08-15 DIAGNOSIS — E669 Obesity, unspecified: Secondary | ICD-10-CM | POA: Insufficient documentation

## 2022-08-15 DIAGNOSIS — I252 Old myocardial infarction: Secondary | ICD-10-CM | POA: Insufficient documentation

## 2022-08-15 DIAGNOSIS — R0601 Orthopnea: Secondary | ICD-10-CM | POA: Insufficient documentation

## 2022-08-15 DIAGNOSIS — R609 Edema, unspecified: Secondary | ICD-10-CM | POA: Insufficient documentation

## 2022-08-15 MED ORDER — FUROSEMIDE 20 MG PO TABS: 80.0000 mg | ORAL_TABLET | Freq: Two times a day (BID) | ORAL | 5 refills | Status: DC

## 2022-08-15 MED ORDER — METOLAZONE 2.5 MG PO TABS: 2.5000 mg | ORAL_TABLET | ORAL | 3 refills | Status: DC

## 2022-08-15 NOTE — Progress Notes (Signed)
ReDS Vest / Clip - 08/15/22 0900       ReDS Vest / Clip   Station Marker B    Ruler Value 32.5    ReDS Value Range High volume overload    ReDS Actual Value 46

## 2022-08-15 NOTE — Telephone Encounter (Signed)
Order faxed to [facility] 

## 2022-08-15 NOTE — Progress Notes (Addendum)
ADVANCED HEART FAILURE CLINIC NOTE  Referring Physician: [facility], [place]: Alliance, [facility] Primary Cardiologist: Dr. Carlyle Dolly  HPI: Sandra Schneider is a 67 y.o. female with CAD (NSTEMI 06/2012 w/ PCI to LAD and Lcx) LVEF 55% at that time, OSA, HTN, hyperlipidemia, hx of breast ca s/p lumpectomy and persistent shortness of breath and lower extremity edema presenting today for evaluation of pulmonary hypertension. She also follows Dr. Elsworth Soho for OSA.  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  She has noticed that over the past several months she has had worsening lower extremity edema and difficulty ambulating due to shortness of breath.  She had a echocardiogram concerning for pulmonary hypertension followed by right heart cath with a mean PA of 69 mmHg.  Her cardiac output was 7 L/min during that case.  She had a workup for interstitial lung disease by pulmonology with CT chest that was negative.  However there was a right upper lobe lung nodule with negative PET scan.  In addition she has had a colonoscopy and mammogram due to hypermetabolic areas in the colon and breast both of which were also negative.  Interval hx:  Despite increasing diuretics, Ms. Hubley remains severely hypervolemic. She feels more swelling in her legs and is becoming quicky short of breath despite compliance with diuretics. Increased diuretic dosing to lasix 80mg  q AM and 60mg  qhs; unfortunately she also drinks a lot of water. Reports keeping a large bottle at her bedside and with her throughout the day to remain hydrated.   Activity level/exercise tolerance: NYHA III, severely volume overloaded today.  Orthopnea:  Sleeps on 3 pillows Paroxysmal noctural dyspnea:  No Chest pain/pressure:  No Orthostatic lightheadedness:  No Palpitations:  No Lower extremity edema:  Yes Presyncope/syncope:  No Cough:  Infrequent but yes  Past  Medical History:  Diagnosis Date   Breast cancer    Coronary artery disease    Diabetes mellitus    GERD (gastroesophageal reflux disease)    Hypertension    Myocardial abscess    Myocardial infarct 06/24/2012   Pulmonary hypertension     Current Outpatient Medications  Medication Sig Dispense Refill   amLODipine (NORVASC) 10 MG tablet Take 1 tablet by mouth once daily 90 tablet 0   Ascorbic Acid (VITAMIN C PO) Take 1 tablet by mouth daily.     aspirin EC 81 MG tablet Take 81 mg by mouth daily.     atorvastatin (LIPITOR) 40 MG tablet Take 1 tablet by mouth once daily 90 tablet 0   chlorthalidone (HYGROTON) 25 MG tablet Take 1 tablet by mouth once daily 90 tablet 3   Cholecalciferol (VITAMIN D) 50 MCG (2000 UT) CAPS Take 2,000 Units by mouth daily.     dorzolamide-timolol (COSOPT) 22.3-6.8 MG/ML ophthalmic solution Place 1 drop into both eyes 2 (two) times daily.     famotidine (PEPCID) 20 MG tablet Take 20 mg by mouth daily.     furosemide (LASIX) 20 MG tablet Take 4 tablets (80 mg total) by mouth every morning AND 3 tablets (60 mg total) every evening. 360 tablet 3   glipiZIDE (GLUCOTROL) 10 MG tablet Take 10 mg by mouth 2 (two) times daily before a meal.     letrozole (FEMARA) 2.5 MG tablet Take 2.5 mg by mouth daily.     lisinopril (ZESTRIL) 40 MG tablet Take 1 tablet by mouth once daily 90 tablet 3   loratadine (CLARITIN) 10 MG  tablet Take 10 mg by mouth daily.     metFORMIN (GLUCOPHAGE) 1000 MG tablet Take 1 tablet (1,000 mg total) by mouth 2 (two) times daily.     metoprolol tartrate (LOPRESSOR) 50 MG tablet Take 50 mg by mouth 2 (two) times daily.     Multiple Vitamin (MULTIVITAMIN) tablet Take 1 tablet by mouth daily.     potassium chloride (KLOR-CON M) 10 MEQ tablet Take 10 mEq by mouth daily.     sildenafil (REVATIO) 20 MG tablet Take 1 tablet (20 mg total) by mouth 3 (three) times daily. 90 tablet 3   Current Facility-Administered Medications  Medication Dose Route  Frequency Provider Last Rate Last Admin   sodium chloride flush (NS) 0.9 % injection 3 mL  3 mL Intravenous Q12H Branch, Alphonse Guild, MD        No Known Allergies    Social History   Socioeconomic History   Marital status: Widowed    Spouse name: Not on file   Number of children: Not on file   Years of education: Not on file   Highest education level: Not on file  Occupational History   Not on file  Tobacco Use   Smoking status: Never    Passive exposure: Never   Smokeless tobacco: Never  Vaping Use   Vaping Use: Never used  Substance and Sexual Activity   Alcohol use: No   Drug use: No   Sexual activity: Yes    Birth control/protection: Post-menopausal  Other Topics Concern   Not on file  Social History Narrative   Not on file   Social Determinants of Health   Financial Resource Strain: Not on file  Food Insecurity: Not on file  Transportation Needs: Not on file  Physical Activity: Not on file  Stress: Not on file  Social Connections: Not on file  Intimate Partner Violence: Not on file      Family History  Problem Relation Age of Onset   Heart disease Father    Diabetes Other    Hypertension Other    Cancer Other    Colon cancer Son 22       alive, doing well.    PHYSICAL EXAM: Vitals:   08/15/22 0907  BP: (!) 140/70  Pulse: 71  SpO2: 94%   GENERAL: Well nourished, well developed, and in no apparent distress at rest.  HEENT: Negative for arcus senilis or xanthelasma. There is no scleral icterus.  The mucous membranes are pink and moist.   NECK: Supple, No masses. Normal carotid upstrokes without bruits. No masses or thyromegaly.    CHEST: There are no chest wall deformities. There is no chest wall tenderness. Respirations are unlabored.  Lungs- decreased at bases CARDIAC:  JVP: mid neck          Normal rate with regular rhythm. No murmurs, rubs or gallops.  Pulses are 2+ and symmetrical in upper and lower extremities. 2+ tight pitting edema to the  mid thighs.  ABDOMEN: Soft, non-tender, non-distended. There are no masses or hepatomegaly. There are normal bowel sounds.  EXTREMITIES: Warm and well perfused with no cyanosis, clubbing.  LYMPHATIC: No axillary or supraclavicular lymphadenopathy.  NEUROLOGIC: Patient is oriented x3 with no focal or lateralizing neurologic deficits.  PSYCH: Patients affect is appropriate, there is no evidence of anxiety or depression.  SKIN: Warm and dry; no lesions or wounds.     DATA REVIEW  ECG:06/13/22: NSR with 1AVB  ECHO: 01/18/22: LVBEF 60-65%. Moderately dilated RV with  mild reduction in function. Dilated RA.  CATH: 02/08/23:   Prox Cx to Dist Cx lesion is 25% stenosed.   Mid LAD lesion is 25% stenosed.   2nd Mrg lesion is 100% stenosed.  Right to left collaterals.  Left to left collaterals.   The left ventricular systolic function is normal.   LV end diastolic pressure is mildly elevated.   The left ventricular ejection fraction is greater than 65% by visual estimate.   Hemodynamic findings consistent with severe pulmonary hypertension.   There is no aortic valve stenosis.   Aortic saturation 96% on 3 L nasal cannula, PA saturation 69% on 3 L, mean RA pressure 32 mm Hg; PA pressure 107/48, mean PA pressure 69 mmHg, pulmonary capillary wedge pressure difficult to assess due to significant V waves, and difficulty wedging.  Cardiac output 7.03 L/min, cardiac index 3.07.   Patent stents with mild restenosis in the LAD and circumflex.  Small obtuse marginal appears occluded distally with some right to left collaterals.  No target for PCI.  PFTS: 03/21/22: - No obstructive lung disease.   ASSESSMENT & PLAN:  Pulmonary hypertension - RHC waveforms reviewed personally. RA 30, RV 107/20-30, PA 107/48 (69), unable to wedge; LVEDP 15. AO 140/64. LVEDP ~16. Assuming a PCWP of 20, TPG would be 49 w/ PVR of 7.  - Hemodynamics consistent with severe pulmonary hypertension largely due to pre-capillary PH.   - Labs: ANA, HCV negative. HIV pending. LFTs mostly unremarkable.  - PFTs w/ moderate restriction.  - BNP previously elevated.  - CT chest w/ pulmonary nodules, however, follow up PET mostly unremarkable.  - Currently on sildenafil 20mg  TID.  - Currently taking lasix 80mg  in the morning and 60mg  in the evening; Taking potassium 10meq daily. she started this on 08/09/22 after seeing Dr. Harl Bowie. Unfortunately she still remains significantly hypervolemic due to excessive fluid intake.  REDS 46% today. - Furoscix today and tomorrow followed by lasix 80mg  BID with KCL 69meq daily. Metolazone 2.5mg  PRN if she remains hypervolemic. APP clinic follow up in 1 week with repeat labs.   2. OSA - followed by pulmonology.  - Severe on recent sleep study; planning on repeat with BIPAP.   3. CAD - LHC as above - followed by general cardiology.   4. Severe obesity - BMI 48.54 today - discussed importance of weight loss.   LHC, RHC and all imaging personally reviewed.   Nemesis [place] [facility] Failure Mechanical Circulatory Support

## 2022-08-15 NOTE — Patient Instructions (Addendum)
INCREASE Lasix to 80 mg Twice daily starting on Thursday 08/17/22  TAKE 1 Furoscix kit today and tomorrow  IF NONE OF THE ABOVE INCREASES YOUR URINE OUTPUT THAN TAKE 1 METOLAZONE 2.5 MG TABLET.  MAKE SURE YOU KEEP TAKING YOUR POTASSIUM  Your provider has order Furoscix for you. This is an on-body infuser that gives you a dose of Furosemide.   It will be shipped to your home   [employer] will call you to discuss before shipping so, PLEASE answer unknown calls  For questions regarding the device call [employer] at [phone]  Ensure you write down the time you start your infusion so that if there is a problem you will know how long the infusion lasted  Use Furoscix only AS DIRECTED by our office  Dosing Directions:   Day 1= 08/15/22 USE 1 KIT  Day 2= 08/16/22 USE 1 KIT    Your physician recommends that you schedule a follow-up appointment in: 7 days as scheduled with the Nurse Practitioner and 6 weeks with Dr. Daniel Nones   If you have any questions or concerns before your next appointment please send [place] a message through [facility] or call our office at [phone].    TO LEAVE A MESSAGE FOR THE NURSE SELECT OPTION 2, PLEASE LEAVE A MESSAGE INCLUDING: YOUR NAME DATE OF BIRTH CALL BACK NUMBER REASON FOR CALL**this is important as we prioritize the call backs  YOU WILL RECEIVE A CALL BACK THE SAME DAY AS LONG AS YOU CALL BEFORE 4:00 PM  At the [facility], you and your health needs are our priority. As part of our continuing mission to provide you with exceptional heart care, we have created designated Provider Care Teams. These Care Teams include your primary Cardiologist (physician) and Advanced Practice Providers (APPs- Physician Assistants and Nurse Practitioners) who all work together to provide you with the care you need, when you need it.   You may see any of the following providers on your designated Care Team at your next follow up: Dr  Glori Bickers Dr Loralie Champagne Dr. Roxana Hires, NP Lyda Jester, [place] [facility] [place], [place] Forestine Na, NP Audry Riles, PharmD   Please be sure to bring in all your medications bottles to every appointment.    Thank you for choosing [facility]

## 2022-08-15 NOTE — Telephone Encounter (Signed)
Kendra from [employer]uro[employer] called and asked for the NYHA Class and whether this is a quick start patient? You can call her at [phone] or re-fax form to [phone]

## 2022-08-15 NOTE — Progress Notes (Signed)
Medication Samples have been provided to the patient.  Drug name: FUROSCIX       Strength: 80mg /90ml        Qty: 2  LOTXW:[id number]  Exp.Date: 03-14-24  Dosing instructions: take 1 kit as directed   The patient has been instructed regarding the correct time, dose, and frequency of taking this medication, including desired effects and most common side effects.   Adah Perl Inez Rosato 9:24 AM 08/15/2022

## 2022-08-16 ENCOUNTER — Telehealth (HOSPITAL_COMMUNITY): Payer: Self-pay

## 2022-08-16 NOTE — Telephone Encounter (Signed)
Called patient as she had a question in regards her furoscix infusion.  Patient stated vial was empty but red light starting flashing after she hit the middle button after taking it off.  I reassured the patient that everything was. She said her legs were less swollen today and she had lost 2lb since yesterday

## 2022-08-16 NOTE — Telephone Encounter (Signed)
They are calling back today stating they need information. Please call back at [phone]

## 2022-08-16 NOTE — Telephone Encounter (Signed)
She called with concern about Furoscix. When she removed today, the light was blinking red and she wanted to make sure that was not a problem.

## 2022-08-18 ENCOUNTER — Telehealth (HOSPITAL_COMMUNITY): Payer: Self-pay

## 2022-08-18 NOTE — Telephone Encounter (Signed)
Spoke with the appeals department to change their denial for her Furoscix. Relevant forms and documentation faxed to [phone]

## 2022-08-21 ENCOUNTER — Encounter (HOSPITAL_COMMUNITY): Payer: Self-pay

## 2022-08-21 ENCOUNTER — Ambulatory Visit (HOSPITAL_COMMUNITY)
Admission: RE | Admit: 2022-08-21 | Discharge: 2022-08-21 | Disposition: A | Discharge status: Home / Self Care | Payer: Medicare Other | Source: Ambulatory Visit | Attending: Cardiology | Admitting: Cardiology

## 2022-08-21 VITALS — BP 130/60 | HR 67 | Wt 286.6 lb

## 2022-08-21 DIAGNOSIS — I50810 Right heart failure, unspecified: Secondary | ICD-10-CM

## 2022-08-21 DIAGNOSIS — I10 Essential (primary) hypertension: Secondary | ICD-10-CM | POA: Diagnosis not present

## 2022-08-21 DIAGNOSIS — I272 Pulmonary hypertension, unspecified: Secondary | ICD-10-CM | POA: Insufficient documentation

## 2022-08-21 DIAGNOSIS — E785 Hyperlipidemia, unspecified: Secondary | ICD-10-CM | POA: Diagnosis not present

## 2022-08-21 DIAGNOSIS — Z6841 Body Mass Index (BMI) 40.0 and over, adult: Secondary | ICD-10-CM | POA: Insufficient documentation

## 2022-08-21 DIAGNOSIS — Z79899 Other long term (current) drug therapy: Secondary | ICD-10-CM | POA: Diagnosis not present

## 2022-08-21 DIAGNOSIS — I252 Old myocardial infarction: Secondary | ICD-10-CM | POA: Insufficient documentation

## 2022-08-21 DIAGNOSIS — M7989 Other specified soft tissue disorders: Secondary | ICD-10-CM | POA: Insufficient documentation

## 2022-08-21 DIAGNOSIS — Z955 Presence of coronary angioplasty implant and graft: Secondary | ICD-10-CM | POA: Diagnosis not present

## 2022-08-21 DIAGNOSIS — Z853 Personal history of malignant neoplasm of breast: Secondary | ICD-10-CM | POA: Insufficient documentation

## 2022-08-21 DIAGNOSIS — G4733 Obstructive sleep apnea (adult) (pediatric): Secondary | ICD-10-CM | POA: Diagnosis not present

## 2022-08-21 DIAGNOSIS — R0602 Shortness of breath: Secondary | ICD-10-CM | POA: Insufficient documentation

## 2022-08-21 DIAGNOSIS — I2510 Atherosclerotic heart disease of native coronary artery without angina pectoris: Secondary | ICD-10-CM | POA: Diagnosis not present

## 2022-08-21 LAB — BASIC METABOLIC PANEL
Anion gap: 13 (ref 5–15)
BUN: 41 mg/dL — ABNORMAL HIGH (ref 8–23)
CO2: 32 mmol/L (ref 22–32)
Calcium: 9.5 mg/dL (ref 8.9–10.3)
Chloride: 95 mmol/L — ABNORMAL LOW (ref 98–111)
Creatinine, Ser: 1.43 mg/dL — ABNORMAL HIGH (ref 0.44–1.00)
GFR, Estimated: 40 mL/min — ABNORMAL LOW (ref >60)
Glucose, Bld: 157 mg/dL — ABNORMAL HIGH (ref 70–99)
Potassium: 3.9 mmol/L (ref 3.5–5.1)
Sodium: 140 mmol/L (ref 135–145)

## 2022-08-21 LAB — BRAIN NATRIURETIC PEPTIDE: B Natriuretic Peptide: 1665.2 pg/mL — ABNORMAL HIGH (ref 0.0–100.0)

## 2022-08-21 MED ORDER — TORSEMIDE 20 MG PO TABS: 40.0000 mg | ORAL_TABLET | Freq: Two times a day (BID) | ORAL | 1 refills | Status: DC

## 2022-08-21 NOTE — Progress Notes (Signed)
ADVANCED HEART FAILURE CLINIC NOTE  Referring Physician: Alliance, Sandra Schneider*  Primary Care: Alliance, [facility] Primary Cardiologist: Dr. Dina Schneider  HPI: Sandra Schneider is a 67 y.o. female with CAD (NSTEMI 06/2012 w/ PCI to LAD and Lcx) LVEF 55% at that time, OSA, HTN, hyperlipidemia, hx of breast ca s/p lumpectomy and persistent shortness of breath and lower extremity edema, recently referred for evaluation of pulmonary hypertension. She also follows Dr. Vassie Schneider for OSA.    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  She has noticed that over the past several months she has had worsening lower extremity edema and difficulty ambulating due to shortness of breath.  She had a echocardiogram concerning for pulmonary hypertension followed by right heart cath with a mean PA of 69 mmHg.  Her cardiac output was 7 L/min during that case.  She had a workup for interstitial lung disease by pulmonology with CT chest that was negative.  However there was a right upper lobe lung nodule with negative PET scan.  In addition she has had a colonoscopy and mammogram due to hypermetabolic areas in the colon and breast both of which were also negative.  Interval hx:  Despite increasing diuretics, Ms. Sandra Schneider remains severely hypervolemic. She feels more swelling in her legs and is becoming quicky short of breath despite compliance with diuretics. Increased diuretic dosing to lasix 80mg  q AM and 60mg  qhs; unfortunately she also drinks a lot of water. Reports keeping a large bottle at her bedside and with her throughout the day to remain hydrated. >>>At f/u visit last wk, on 4/2, Dr. Gasper Schneider prescribed Furoscix daily x 2 days, followed by transition back to PO lasix 80mg  BID and recs for Metolazone 2.5mg  PRN if remains hypervolemic. Of note, her ReDs was 46% at visit and wt was 295 lb (up from 276 lb).  She returns today for 1 wk f/u to reassess volume  status. Wt down 9 lb at 286 lb. ReDs improved 33%. She reports slight interval improvement in dyspnea but c/w some exertional dyspnea w/ mild-mod level activities. No orthopnea/PND. C/w abdominal fullness and swelling. Remains 10 lb up from prior dry wt. Reports compliance w/ diuretic regimen. Has noticed increase in UOP w/ adjustment of lasix dose but still not robust.      Past Medical History:  Diagnosis Date   Breast cancer    Coronary artery disease    Diabetes mellitus    GERD (gastroesophageal reflux disease)    Hypertension    Myocardial abscess    Myocardial infarct 06/24/2012   Pulmonary hypertension     Current Outpatient Medications  Medication Sig Dispense Refill   amLODipine (NORVASC) 10 MG tablet Take 1 tablet by mouth once daily 90 tablet 0   Ascorbic Acid (VITAMIN C PO) Take 1 tablet by mouth daily.     aspirin EC 81 MG tablet Take 81 mg by mouth daily.     atorvastatin (LIPITOR) 40 MG tablet Take 1 tablet by mouth once daily 90 tablet 0   chlorthalidone (HYGROTON) 25 MG tablet Take 1 tablet by mouth once daily 90 tablet 3   Cholecalciferol (VITAMIN D) 50 MCG (2000 UT) CAPS Take 2,000 Units by mouth daily.     dorzolamide-timolol (COSOPT) 22.3-6.8 MG/ML ophthalmic solution Place 1 drop into both eyes 2 (two) times daily.     famotidine (PEPCID) 20 MG tablet Take 20 mg by mouth daily.     furosemide (LASIX) 20 MG  tablet Take 4 tablets (80 mg total) by mouth 2 (two) times daily. 400 tablet 5   glipiZIDE (GLUCOTROL) 10 MG tablet Take 10 mg by mouth 2 (two) times daily before a meal.     letrozole (FEMARA) 2.5 MG tablet Take 2.5 mg by mouth daily.     lisinopril (ZESTRIL) 40 MG tablet Take 1 tablet by mouth once daily 90 tablet 3   loratadine (CLARITIN) 10 MG tablet Take 10 mg by mouth daily.     metFORMIN (GLUCOPHAGE) 1000 MG tablet Take 1 tablet (1,000 mg total) by mouth 2 (two) times daily.     metolazone (ZAROXOLYN) 2.5 MG tablet Take 1 tablet (2.5 mg total) by mouth  as directed. Take as instructed by the heart failure clinic 25 tablet 3   metoprolol tartrate (LOPRESSOR) 50 MG tablet Take 50 mg by mouth 2 (two) times daily.     Multiple Vitamin (MULTIVITAMIN) tablet Take 1 tablet by mouth daily.     potassium chloride (KLOR-CON M) 10 MEQ tablet Take 10 mEq by mouth daily.     sildenafil (REVATIO) 20 MG tablet Take 1 tablet (20 mg total) by mouth 3 (three) times daily. 90 tablet 3   Current Facility-Administered Medications  Medication Dose Route Frequency Provider Last Rate Last Admin   sodium chloride flush (NS) 0.9 % injection 3 mL  3 mL Intravenous Q12H Branch, Dorothe Pea, MD        No Known Allergies    Social History   Socioeconomic History   Marital status: Widowed    Spouse name: Not on file   Number of children: Not on file   Years of education: Not on file   Highest education level: Not on file  Occupational History   Not on file  Tobacco Use   Smoking status: Never    Passive exposure: Never   Smokeless tobacco: Never  Vaping Use   Vaping Use: Never used  Substance and Sexual Activity   Alcohol use: No   Drug use: No   Sexual activity: Yes    Birth control/protection: Post-menopausal  Other Topics Concern   Not on file  Social History Narrative   Not on file   Social Determinants of Health   Financial Resource Strain: Not on file  Food Insecurity: Not on file  Transportation Needs: Not on file  Physical Activity: Not on file  Stress: Not on file  Social Connections: Not on file  Intimate Partner Violence: Not on file      Family History  Problem Relation Age of Onset   Heart disease Father    Diabetes Other    Hypertension Other    Cancer Other    Colon cancer Son 22       alive, doing well.    PHYSICAL EXAM: Vitals:   08/21/22 0840  Pulse: 67  SpO2: (!) 88%    PHYSICAL EXAM: ReDs 33%  General:  fatigued appearing, obese. No respiratory difficulty HEENT: normal Neck: supple. JVD 14 cm Carotids 2+  bilat; no bruits. No lymphadenopathy or thyromegaly appreciated. Cor: PMI nondisplaced. Regular rate & rhythm. No rubs, gallops or murmurs. Lungs: decreased BS at the bases bilaterally  Abdomen: obese, soft, nontender, nondistended. No hepatosplenomegaly. No bruits or masses. Good bowel sounds. Extremities: no cyanosis, clubbing, rash, trace b/l LEE, bilateral varicose veins and chronic venous stasis dermatitis  Neuro: alert & oriented x 3, cranial nerves grossly intact. moves all 4 extremities w/o difficulty. Affect pleasant.   DATA REVIEW  ECG:06/13/22: NSR with 1AVB  ECHO: 01/18/22: LVBEF 60-65%. Moderately dilated RV with mild reduction in function. Dilated RA.  CATH: 02/08/23:   Prox Cx to Dist Cx lesion is 25% stenosed.   Mid LAD lesion is 25% stenosed.   2nd Mrg lesion is 100% stenosed.  Right to left collaterals.  Left to left collaterals.   The left ventricular systolic function is normal.   LV end diastolic pressure is mildly elevated.   The left ventricular ejection fraction is greater than 65% by visual estimate.   Hemodynamic findings consistent with severe pulmonary hypertension.   There is no aortic valve stenosis.   Aortic saturation 96% on 3 L nasal cannula, PA saturation 69% on 3 L, mean RA pressure 32 mm Hg; PA pressure 107/48, mean PA pressure 69 mmHg, pulmonary capillary wedge pressure difficult to assess due to significant V waves, and difficulty wedging.  Cardiac output 7.03 L/min, cardiac index 3.07.   Patent stents with mild restenosis in the LAD and circumflex.  Small obtuse marginal appears occluded distally with some right to left collaterals.  No target for PCI.  PFTS: 03/21/22: - No obstructive lung disease.   ASSESSMENT & PLAN:  Pulmonary hypertension w/ Subsequent RV Failure  - RHC waveforms reviewed personally. RA 30, RV 107/20-30, PA 107/48 (69), unable to wedge; LVEDP 15. AO 140/64. LVEDP ~16. Assuming a PCWP of 20, TPG would be 49 w/ PVR of 7.  -  Hemodynamics consistent with severe pulmonary hypertension largely due to pre-capillary PH.  - Labs: ANA, HCV negative. HIV pending. LFTs mostly unremarkable.  - PFTs w/ moderate restriction.  - CT chest w/ pulmonary nodules, however, follow up PET mostly unremarkable.  - Echo 3/12 RV severely enlarged w/ severely reduced RVSF  - Currently on sildenafil 20mg  TID.  - NYHA Class III, remains hypervolemic w/ mainly abdominal edema, pulmonary congestion improved ReDS down to 33%. Subpar response to PO Lasix - Stop Lasix - Transition to Torsemide 40 mg bid  - BNP and BMP today. If SCr/K stable, will add Spiro 12.5 mg daily  - repeat labs again in 7-10 days   2. OSA - followed by pulmonology.  - Severe on recent sleep study; planning on repeat with BIPAP.   3. CAD - LHC as above - followed by general cardiology.   4. Severe obesity - Body mass index is 47.69 kg/m. - discussed importance of weight loss.   F/u w/ APP in 7-10 days and keep f/u w/ Dr. Gasper LloydSabharwal 5/14.    Robbie LisBrittainy Lateria Alderman, PA-C

## 2022-08-21 NOTE — Progress Notes (Signed)
ReDS Vest / Clip - 08/21/22 0900       ReDS Vest / Clip   Station Marker B    Ruler Value 41    ReDS Value Range Low volume    ReDS Actual Value 33    Anatomical Comments sitting

## 2022-08-21 NOTE — Patient Instructions (Signed)
Labs done today. We will contact you only if your labs are abnormal.  STOP taking Lasix  START Torsemide 40mg  (2 tablets) by mouth 2 times daily.   No other medication changes were made. Please continue all current medications as prescribed.  Your physician recommends that you schedule a follow-up appointment in: 7-10 days with our NP/PA Clinic here in our office.   If you have any questions or concerns before your next appointment please send [place] a message through [place] or call our office at [phone].    TO LEAVE A MESSAGE FOR THE NURSE SELECT OPTION 2, PLEASE LEAVE A MESSAGE INCLUDING: YOUR NAME DATE OF BIRTH CALL BACK NUMBER REASON FOR CALL**this is important as we prioritize the call backs  YOU WILL RECEIVE A CALL BACK THE SAME DAY AS LONG AS YOU CALL BEFORE 4:00 PM   Do the following things EVERYDAY: Weigh yourself in the morning before breakfast. Write it down and keep it in a log. Take your medicines as prescribed Eat low salt foods--Limit salt (sodium) to 2000 mg per day.  Stay as active as you can everyday Limit all fluids for the day to less than 2 liters   At the [facility], you and your health needs are our priority. As part of our continuing mission to provide you with exceptional heart care, we have created designated Provider Care Teams. These Care Teams include your primary Cardiologist (physician) and Advanced Practice Providers (APPs- Physician Assistants and Nurse Practitioners) who all work together to provide you with the care you need, when you need it.   You may see any of the following providers on your designated Care Team at your next follow up: Dr Arvilla Meres Dr Carron Curie, NP Robbie Lis, [place] Karle Plumber, PharmD   Please be sure to bring in all your medications bottles to every appointment.

## 2022-08-22 ENCOUNTER — Other Ambulatory Visit (HOSPITAL_COMMUNITY): Payer: Self-pay

## 2022-08-22 ENCOUNTER — Telehealth (HOSPITAL_COMMUNITY): Payer: Self-pay

## 2022-08-22 DIAGNOSIS — I50812 Chronic right heart failure: Secondary | ICD-10-CM | POA: Diagnosis not present

## 2022-08-22 DIAGNOSIS — Z79811 Long term (current) use of aromatase inhibitors: Secondary | ICD-10-CM | POA: Diagnosis not present

## 2022-08-22 DIAGNOSIS — Z170 Estrogen receptor positive status [ER+]: Secondary | ICD-10-CM | POA: Diagnosis not present

## 2022-08-22 DIAGNOSIS — I272 Pulmonary hypertension, unspecified: Secondary | ICD-10-CM | POA: Diagnosis not present

## 2022-08-22 DIAGNOSIS — T451X5A Adverse effect of antineoplastic and immunosuppressive drugs, initial encounter: Secondary | ICD-10-CM | POA: Diagnosis not present

## 2022-08-22 DIAGNOSIS — M898X9 Other specified disorders of bone, unspecified site: Secondary | ICD-10-CM | POA: Diagnosis not present

## 2022-08-22 DIAGNOSIS — C50411 Malignant neoplasm of upper-outer quadrant of right female breast: Secondary | ICD-10-CM | POA: Diagnosis not present

## 2022-08-22 NOTE — Telephone Encounter (Signed)
error 

## 2022-08-22 NOTE — Telephone Encounter (Signed)
Appeal approved and PA for Furoscix is valid till 05-15-23

## 2022-09-01 ENCOUNTER — Other Ambulatory Visit (HOSPITAL_COMMUNITY)
Admission: RE | Admit: 2022-09-01 | Discharge: 2022-09-01 | Disposition: A | Discharge status: Home / Self Care | Payer: Medicare Other | Source: Ambulatory Visit | Attending: Cardiology | Admitting: Cardiology

## 2022-09-01 ENCOUNTER — Telehealth (HOSPITAL_COMMUNITY): Payer: Self-pay

## 2022-09-01 DIAGNOSIS — M8589 Other specified disorders of bone density and structure, multiple sites: Secondary | ICD-10-CM | POA: Diagnosis not present

## 2022-09-01 DIAGNOSIS — T451X5A Adverse effect of antineoplastic and immunosuppressive drugs, initial encounter: Secondary | ICD-10-CM | POA: Diagnosis not present

## 2022-09-01 DIAGNOSIS — I272 Pulmonary hypertension, unspecified: Secondary | ICD-10-CM | POA: Insufficient documentation

## 2022-09-01 DIAGNOSIS — Z170 Estrogen receptor positive status [ER+]: Secondary | ICD-10-CM | POA: Diagnosis not present

## 2022-09-01 DIAGNOSIS — C50411 Malignant neoplasm of upper-outer quadrant of right female breast: Secondary | ICD-10-CM | POA: Diagnosis not present

## 2022-09-01 DIAGNOSIS — Z79811 Long term (current) use of aromatase inhibitors: Secondary | ICD-10-CM | POA: Diagnosis not present

## 2022-09-01 DIAGNOSIS — I50812 Chronic right heart failure: Secondary | ICD-10-CM | POA: Diagnosis not present

## 2022-09-01 DIAGNOSIS — M898X9 Other specified disorders of bone, unspecified site: Secondary | ICD-10-CM | POA: Diagnosis not present

## 2022-09-01 LAB — BASIC METABOLIC PANEL
Anion gap: 15 (ref 5–15)
BUN: 85 mg/dL — ABNORMAL HIGH (ref 8–23)
CO2: 28 mmol/L (ref 22–32)
Calcium: 8.7 mg/dL — ABNORMAL LOW (ref 8.9–10.3)
Chloride: 93 mmol/L — ABNORMAL LOW (ref 98–111)
Creatinine, Ser: 4.14 mg/dL — ABNORMAL HIGH (ref 0.44–1.00)
GFR, Estimated: 11 mL/min — ABNORMAL LOW (ref >60)
Glucose, Bld: 85 mg/dL (ref 70–99)
Potassium: 3.9 mmol/L (ref 3.5–5.1)
Sodium: 136 mmol/L (ref 135–145)

## 2022-09-01 NOTE — Telephone Encounter (Addendum)
Pt aware, agreeable, and verbalized understanding  Has follow up next week  ----- Message from Dorthula Nettles, DO sent at 09/01/2022 11:57 AM EDT ----- Her creatinine is very concerning. Lets hold off on all diuretics and repeat labs again next Tuesday. Can we get her back into clinic on Tuesday?  Adi

## 2022-09-04 NOTE — Progress Notes (Signed)
ADVANCED HEART FAILURE CLINIC NOTE  Referring Physician: [facility], Miguel Aschoff*  Primary Care: [facility], [facility] Primary Cardiologist: Dr. Dina Rich  HPI: Sandra Schneider is a 67 y.o. female with CAD (NSTEMI 06/2012 w/ PCI to LAD and Lcx) LVEF 55% at that time, OSA, HTN, hyperlipidemia, hx of breast ca s/p lumpectomy and persistent shortness of breath and lower extremity edema, recently referred for evaluation of pulmonary hypertension. She also follows Dr. Vassie Loll for OSA.    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  She has noticed that over the past several months she has had worsening lower extremity edema and difficulty ambulating due to shortness of breath.  She had a echocardiogram concerning for pulmonary hypertension followed by right heart cath with a mean PA of 69 mmHg.  Her cardiac output was 7 L/min during that case.  She had a workup for interstitial lung disease by pulmonology with CT chest that was negative.  However there was a right upper lobe lung nodule with negative PET scan.  In addition she has had a colonoscopy and mammogram due to hypermetabolic areas in the colon and breast both of which were also negative.  Interval hx:  Despite increasing diuretics, Sandra Schneider remains severely hypervolemic. She feels more swelling in her legs and is becoming quicky short of breath despite compliance with diuretics. Increased diuretic dosing to lasix  q AM and  qhs; unfortunately she also drinks a lot of water. Reports keeping a large bottle at her bedside and with her throughout the day to remain hydrated. >>>At f/u visit last wk, on 4/2, Dr. Gasper Lloyd prescribed Furoscix daily x 2 days, followed by transition back to PO lasix  BID and recs for Metolazone 2.5mg  PRN if remains hypervolemic. Of note, her ReDs was 46% at visit and wt was 295 lb (up from 276 lb).  She returns today for 1 wk f/u to reassess volume  status. Wt down 9 lb at 286 lb. ReDs improved 33%. She reports slight interval improvement in dyspnea but c/w some exertional dyspnea w/ mild-mod level activities. No orthopnea/PND. C/w abdominal fullness and swelling. Remains 10 lb up from prior dry wt. Reports compliance w/ diuretic regimen. Has noticed increase in UOP w/ adjustment of lasix dose but still not robust.      Past Medical History:  Diagnosis Date   Breast cancer    Coronary artery disease    Diabetes mellitus    GERD (gastroesophageal reflux disease)    Hypertension    Myocardial abscess    Myocardial infarct 06/24/2012   Pulmonary hypertension     Current Outpatient Medications  Medication Sig Dispense Refill   amLODipine (NORVASC) 10 MG tablet Take 1 tablet by mouth once daily 90 tablet 0   Ascorbic Acid (VITAMIN C PO) Take 1 tablet by mouth daily.     aspirin EC 81 MG tablet Take 81 mg by mouth daily.     atorvastatin (LIPITOR) 40 MG tablet Take 1 tablet by mouth once daily 90 tablet 0   chlorthalidone (HYGROTON) 25 MG tablet Take 1 tablet by mouth once daily 90 tablet 3   Cholecalciferol (VITAMIN D) 50 MCG (2000 UT) CAPS Take 2,000 Units by mouth daily.     dorzolamide-timolol (COSOPT) 22.3-6.8 MG/ML ophthalmic solution Place 1 drop into both eyes 2 (two) times daily.     famotidine (PEPCID) 20 MG tablet Take 20 mg by mouth daily.     glipiZIDE (GLUCOTROL) 10 MG  tablet Take 10 mg by mouth 2 (two) times daily before a meal.     letrozole (FEMARA) 2.5 MG tablet Take 2.5 mg by mouth daily.     lisinopril (ZESTRIL) 40 MG tablet Take 1 tablet by mouth once daily 90 tablet 3   loratadine (CLARITIN) 10 MG tablet Take 10 mg by mouth daily.     metFORMIN (GLUCOPHAGE) 1000 MG tablet Take 1 tablet (1,000 mg total) by mouth 2 (two) times daily.     metolazone (ZAROXOLYN) 2.5 MG tablet Take 1 tablet (2.5 mg total) by mouth as directed. Take as instructed by the heart failure clinic 25 tablet 3   metoprolol tartrate (LOPRESSOR)  50 MG tablet Take 50 mg by mouth 2 (two) times daily.     Multiple Vitamin (MULTIVITAMIN) tablet Take 1 tablet by mouth daily.     potassium chloride (KLOR-CON M) 10 MEQ tablet Take 10 mEq by mouth daily.     sildenafil (REVATIO) 20 MG tablet Take 1 tablet (20 mg total) by mouth 3 (three) times daily. 90 tablet 3   torsemide (DEMADEX) 20 MG tablet Take 2 tablets (40 mg total) by mouth 2 (two) times daily. 360 tablet 1   Current Facility-Administered Medications  Medication Dose Route Frequency Provider Last Rate Last Admin   sodium chloride flush (NS) 0.9 % injection 3 mL  3 mL Intravenous Q12H Branch, Dorothe Pea, MD        No Known Allergies    Social History   Socioeconomic History   Marital status: Widowed    Spouse name: Not on file   Number of children: Not on file   Years of education: Not on file   Highest education level: Not on file  Occupational History   Not on file  Tobacco Use   Smoking status: Never    Passive exposure: Never   Smokeless tobacco: Never  Vaping Use   Vaping Use: Never used  Substance and Sexual Activity   Alcohol use: No   Drug use: No   Sexual activity: Yes    Birth control/protection: Post-menopausal  Other Topics Concern   Not on file  Social History Narrative   Not on file   Social Determinants of Health   Financial Resource Strain: Not on file  Food Insecurity: Not on file  Transportation Needs: Not on file  Physical Activity: Not on file  Stress: Not on file  Social Connections: Not on file  Intimate Partner Violence: Not on file      Family History  Problem Relation Age of Onset   Heart disease Father    Diabetes Other    Hypertension Other    Cancer Other    Colon cancer Son 22       alive, doing well.    PHYSICAL EXAM: There were no vitals filed for this visit.   PHYSICAL EXAM: ReDs 33%  General:  fatigued appearing, obese. No respiratory difficulty HEENT: normal Neck: supple. JVD 14 cm Carotids 2+ bilat; no  bruits. No lymphadenopathy or thyromegaly appreciated. Cor: PMI nondisplaced. Regular rate & rhythm. No rubs, gallops or murmurs. Lungs: decreased BS at the bases bilaterally  Abdomen: obese, soft, nontender, nondistended. No hepatosplenomegaly. No bruits or masses. Good bowel sounds. Extremities: no cyanosis, clubbing, rash, trace b/l LEE, bilateral varicose veins and chronic venous stasis dermatitis  Neuro: alert & oriented x 3, cranial nerves grossly intact. moves all 4 extremities w/o difficulty. Affect pleasant.   DATA REVIEW  ECG:06/13/22: NSR with 1AVB  ECHO: 01/18/22: LVBEF 60-65%. Moderately dilated RV with mild reduction in function. Dilated RA.  CATH: 02/08/23:   Prox Cx to Dist Cx lesion is 25% stenosed.   Mid LAD lesion is 25% stenosed.   2nd Mrg lesion is 100% stenosed.  Right to left collaterals.  Left to left collaterals.   The left ventricular systolic function is normal.   LV end diastolic pressure is mildly elevated.   The left ventricular ejection fraction is greater than 65% by visual estimate.   Hemodynamic findings consistent with severe pulmonary hypertension.   There is no aortic valve stenosis.   Aortic saturation 96% on 3 L nasal cannula, PA saturation 69% on 3 L, mean RA pressure 32 mm Hg; PA pressure 107/48, mean PA pressure 69 mmHg, pulmonary capillary wedge pressure difficult to assess due to significant V waves, and difficulty wedging.  Cardiac output 7.03 L/min, cardiac index 3.07.   Patent stents with mild restenosis in the LAD and circumflex.  Small obtuse marginal appears occluded distally with some right to left collaterals.  No target for PCI.  PFTS: 03/21/22: - No obstructive lung disease.   ASSESSMENT & PLAN:  Pulmonary hypertension w/ Subsequent RV Failure  - RHC waveforms reviewed personally. RA 30, RV 107/20-30, PA 107/48 (69), unable to wedge; LVEDP 15. AO 140/64. LVEDP ~16. Assuming a PCWP of 20, TPG would be 49 w/ PVR of 7.  - Hemodynamics  consistent with severe pulmonary hypertension largely due to pre-capillary PH.  - Labs: ANA, HCV negative. HIV pending. LFTs mostly unremarkable.  - PFTs w/ moderate restriction.  - CT chest w/ pulmonary nodules, however, follow up PET mostly unremarkable.  - Echo 3/12 RV severely enlarged w/ severely reduced RVSF  - Currently on sildenafil  TID.  - NYHA Class III, remains hypervolemic w/ mainly abdominal edema, pulmonary congestion improved ReDS down to 33%. Subpar response to PO Lasix - Stop Lasix - Transition to Torsemide 40 mg bid  - BNP and BMP today. If SCr/K stable, will add Spiro 12.5 mg daily  - repeat labs again in 7-10 days   2. OSA - followed by pulmonology.  - Severe on recent sleep study; planning on repeat with BIPAP.   3. CAD - LHC as above - followed by general cardiology.   4. Severe obesity - There is no height or weight on file to calculate BMI. - discussed importance of weight loss.   F/u w/ APP in 7-10 days and keep f/u w/ Dr. Gasper Lloyd 5/14.    Jacklynn Ganong, PA-C

## 2022-09-06 ENCOUNTER — Ambulatory Visit (HOSPITAL_COMMUNITY)
Admission: RE | Admit: 2022-09-06 | Discharge: 2022-09-06 | Disposition: A | Discharge status: Home / Self Care | Payer: Medicare Other | Source: Ambulatory Visit | Attending: Family Medicine | Admitting: Family Medicine

## 2022-09-06 ENCOUNTER — Encounter (HOSPITAL_COMMUNITY): Payer: Self-pay

## 2022-09-06 VITALS — BP 150/72 | HR 64 | Wt 291.6 lb

## 2022-09-06 DIAGNOSIS — N179 Acute kidney failure, unspecified: Secondary | ICD-10-CM | POA: Diagnosis not present

## 2022-09-06 DIAGNOSIS — G4733 Obstructive sleep apnea (adult) (pediatric): Secondary | ICD-10-CM | POA: Insufficient documentation

## 2022-09-06 DIAGNOSIS — E119 Type 2 diabetes mellitus without complications: Secondary | ICD-10-CM | POA: Diagnosis not present

## 2022-09-06 DIAGNOSIS — I252 Old myocardial infarction: Secondary | ICD-10-CM | POA: Insufficient documentation

## 2022-09-06 DIAGNOSIS — R0602 Shortness of breath: Secondary | ICD-10-CM | POA: Insufficient documentation

## 2022-09-06 DIAGNOSIS — Z7182 Exercise counseling: Secondary | ICD-10-CM | POA: Diagnosis not present

## 2022-09-06 DIAGNOSIS — Z6841 Body Mass Index (BMI) 40.0 and over, adult: Secondary | ICD-10-CM | POA: Diagnosis not present

## 2022-09-06 DIAGNOSIS — I2510 Atherosclerotic heart disease of native coronary artery without angina pectoris: Secondary | ICD-10-CM | POA: Insufficient documentation

## 2022-09-06 DIAGNOSIS — E785 Hyperlipidemia, unspecified: Secondary | ICD-10-CM | POA: Diagnosis not present

## 2022-09-06 DIAGNOSIS — I272 Pulmonary hypertension, unspecified: Secondary | ICD-10-CM | POA: Insufficient documentation

## 2022-09-06 DIAGNOSIS — Z853 Personal history of malignant neoplasm of breast: Secondary | ICD-10-CM | POA: Diagnosis not present

## 2022-09-06 DIAGNOSIS — Z955 Presence of coronary angioplasty implant and graft: Secondary | ICD-10-CM | POA: Diagnosis not present

## 2022-09-06 DIAGNOSIS — I10 Essential (primary) hypertension: Secondary | ICD-10-CM | POA: Diagnosis not present

## 2022-09-06 DIAGNOSIS — Z79899 Other long term (current) drug therapy: Secondary | ICD-10-CM | POA: Diagnosis not present

## 2022-09-06 DIAGNOSIS — Z713 Dietary counseling and surveillance: Secondary | ICD-10-CM | POA: Diagnosis not present

## 2022-09-06 LAB — COMPREHENSIVE METABOLIC PANEL
ALT: 15 U/L (ref 0–44)
AST: 26 U/L (ref 15–41)
Albumin: 3.9 g/dL (ref 3.5–5.0)
Alkaline Phosphatase: 53 U/L (ref 38–126)
Anion gap: 12 (ref 5–15)
BUN: 65 mg/dL — ABNORMAL HIGH (ref 8–23)
CO2: 27 mmol/L (ref 22–32)
Calcium: 9.5 mg/dL (ref 8.9–10.3)
Chloride: 100 mmol/L (ref 98–111)
Creatinine, Ser: 2.04 mg/dL — ABNORMAL HIGH (ref 0.44–1.00)
GFR, Estimated: 26 mL/min — ABNORMAL LOW (ref >60)
Glucose, Bld: 63 mg/dL — ABNORMAL LOW (ref 70–99)
Potassium: 3.9 mmol/L (ref 3.5–5.1)
Sodium: 139 mmol/L (ref 135–145)
Total Bilirubin: 1.1 mg/dL (ref 0.3–1.2)
Total Protein: 7.2 g/dL (ref 6.5–8.1)

## 2022-09-06 LAB — BRAIN NATRIURETIC PEPTIDE: B Natriuretic Peptide: 1807.9 pg/mL — ABNORMAL HIGH (ref 0.0–100.0)

## 2022-09-06 MED ORDER — POTASSIUM CHLORIDE CRYS ER 10 MEQ PO TBCR: 20.0000 meq | EXTENDED_RELEASE_TABLET | Freq: Every day | ORAL | 11 refills | Status: DC

## 2022-09-06 MED ORDER — FUROSEMIDE 20 MG PO TABS: 80.0000 mg | ORAL_TABLET | Freq: Two times a day (BID) | ORAL | 11 refills | Status: DC

## 2022-09-06 NOTE — Patient Instructions (Signed)
STOP Torsemide START Lasix 80 mg twice a day INCREASE Potassium to 20 meq daily  Labs today We will only contact you if something comes back abnormal or we need to make some changes. Otherwise no news is good news!  Keep cardiology follow up as scheduled  Do the following things EVERYDAY: Weigh yourself in the morning before breakfast. Write it down and keep it in a log. Take your medicines as prescribed Eat low salt foods--Limit salt (sodium) to 2000 mg per day.  Stay as active as you can everyday Limit all fluids for the day to less than 2 liters  At the [facility], you and your health needs are our priority. As part of our continuing mission to provide you with exceptional heart care, we have created designated Provider Care Teams. These Care Teams include your primary Cardiologist (physician) and Advanced Practice Providers (APPs- Physician Assistants and Nurse Practitioners) who all work together to provide you with the care you need, when you need it.   You may see any of the following providers on your designated Care Team at your next follow up: Dr Arvilla Meres Dr Marca Ancona Dr. Marcos Eke, NP Robbie Lis, [place] [facility] [place], [place] Brynda Peon, NP Karle Plumber, PharmD   Please be sure to bring in all your medications bottles to every appointment.    Thank you for choosing [facility]

## 2022-09-06 NOTE — Progress Notes (Signed)
hospital  ReDS Vest / Clip - 09/06/22 1500       ReDS Vest / Clip   Station Marker B    Ruler Value 36.5    ReDS Value Range Moderate volume overload    ReDS Actual Value 38

## 2022-09-07 ENCOUNTER — Other Ambulatory Visit: Payer: Self-pay

## 2022-09-07 ENCOUNTER — Ambulatory Visit: Payer: Self-pay

## 2022-09-07 ENCOUNTER — Observation Stay (HOSPITAL_COMMUNITY)
Admission: EM | Admit: 2022-09-07 | Discharge: 2022-09-09 | Disposition: A | Discharge status: Home / Self Care | Payer: Medicare Other | Attending: Internal Medicine | Admitting: Internal Medicine

## 2022-09-07 DIAGNOSIS — Z6841 Body Mass Index (BMI) 40.0 and over, adult: Secondary | ICD-10-CM | POA: Insufficient documentation

## 2022-09-07 DIAGNOSIS — E669 Obesity, unspecified: Secondary | ICD-10-CM | POA: Diagnosis not present

## 2022-09-07 DIAGNOSIS — N189 Chronic kidney disease, unspecified: Secondary | ICD-10-CM | POA: Diagnosis not present

## 2022-09-07 DIAGNOSIS — E1165 Type 2 diabetes mellitus with hyperglycemia: Secondary | ICD-10-CM | POA: Diagnosis not present

## 2022-09-07 DIAGNOSIS — I2510 Atherosclerotic heart disease of native coronary artery without angina pectoris: Secondary | ICD-10-CM | POA: Diagnosis not present

## 2022-09-07 DIAGNOSIS — E785 Hyperlipidemia, unspecified: Secondary | ICD-10-CM | POA: Diagnosis not present

## 2022-09-07 DIAGNOSIS — I129 Hypertensive chronic kidney disease with stage 1 through stage 4 chronic kidney disease, or unspecified chronic kidney disease: Secondary | ICD-10-CM | POA: Diagnosis not present

## 2022-09-07 DIAGNOSIS — E162 Hypoglycemia, unspecified: Secondary | ICD-10-CM | POA: Diagnosis present

## 2022-09-07 DIAGNOSIS — Z7984 Long term (current) use of oral hypoglycemic drugs: Secondary | ICD-10-CM | POA: Insufficient documentation

## 2022-09-07 DIAGNOSIS — N1832 Chronic kidney disease, stage 3b: Secondary | ICD-10-CM | POA: Insufficient documentation

## 2022-09-07 DIAGNOSIS — I10 Essential (primary) hypertension: Secondary | ICD-10-CM | POA: Diagnosis present

## 2022-09-07 DIAGNOSIS — E1122 Type 2 diabetes mellitus with diabetic chronic kidney disease: Secondary | ICD-10-CM | POA: Diagnosis not present

## 2022-09-07 DIAGNOSIS — Z7982 Long term (current) use of aspirin: Secondary | ICD-10-CM | POA: Insufficient documentation

## 2022-09-07 DIAGNOSIS — I5033 Acute on chronic diastolic (congestive) heart failure: Secondary | ICD-10-CM | POA: Insufficient documentation

## 2022-09-07 DIAGNOSIS — Z79899 Other long term (current) drug therapy: Secondary | ICD-10-CM | POA: Insufficient documentation

## 2022-09-07 DIAGNOSIS — I130 Hypertensive heart and chronic kidney disease with heart failure and stage 1 through stage 4 chronic kidney disease, or unspecified chronic kidney disease: Secondary | ICD-10-CM | POA: Diagnosis not present

## 2022-09-07 DIAGNOSIS — E11649 Type 2 diabetes mellitus with hypoglycemia without coma: Secondary | ICD-10-CM | POA: Diagnosis not present

## 2022-09-07 DIAGNOSIS — N184 Chronic kidney disease, stage 4 (severe): Secondary | ICD-10-CM

## 2022-09-07 DIAGNOSIS — Z853 Personal history of malignant neoplasm of breast: Secondary | ICD-10-CM | POA: Insufficient documentation

## 2022-09-07 LAB — COMPREHENSIVE METABOLIC PANEL
ALT: 15 U/L (ref 0–44)
AST: 26 U/L (ref 15–41)
Albumin: 3.8 g/dL (ref 3.5–5.0)
Alkaline Phosphatase: 55 U/L (ref 38–126)
Anion gap: 13 (ref 5–15)
BUN: 63 mg/dL — ABNORMAL HIGH (ref 8–23)
CO2: 26 mmol/L (ref 22–32)
Calcium: 9.1 mg/dL (ref 8.9–10.3)
Chloride: 97 mmol/L — ABNORMAL LOW (ref 98–111)
Creatinine, Ser: 1.99 mg/dL — ABNORMAL HIGH (ref 0.44–1.00)
GFR, Estimated: 27 mL/min — ABNORMAL LOW (ref >60)
Glucose, Bld: 127 mg/dL — ABNORMAL HIGH (ref 70–99)
Potassium: 3.4 mmol/L — ABNORMAL LOW (ref 3.5–5.1)
Sodium: 136 mmol/L (ref 135–145)
Total Bilirubin: 1.1 mg/dL (ref 0.3–1.2)
Total Protein: 7.4 g/dL (ref 6.5–8.1)

## 2022-09-07 LAB — CBG MONITORING, ED
Glucose-Capillary: 37 mg/dL — CL (ref 70–99)
Glucose-Capillary: 44 mg/dL — CL (ref 70–99)
Glucose-Capillary: 117 mg/dL — ABNORMAL HIGH (ref 70–99)
Glucose-Capillary: 82 mg/dL (ref 70–99)
Glucose-Capillary: 98 mg/dL (ref 70–99)

## 2022-09-07 LAB — CBC
HCT: 37 % (ref 36.0–46.0)
Hemoglobin: 11.5 g/dL — ABNORMAL LOW (ref 12.0–15.0)
MCH: 32 pg (ref 26.0–34.0)
MCHC: 31.1 g/dL (ref 30.0–36.0)
MCV: 103.1 fL — ABNORMAL HIGH (ref 80.0–100.0)
Platelets: 223 10*3/uL (ref 150–400)
RBC: 3.59 MIL/uL — ABNORMAL LOW (ref 3.87–5.11)
RDW: 15.5 % (ref 11.5–15.5)
WBC: 5.5 10*3/uL (ref 4.0–10.5)
nRBC: 0 % (ref 0.0–0.2)

## 2022-09-07 MED ORDER — DEXTROSE 10 % IV SOLN: Freq: Once | INTRAVENOUS | Status: AC

## 2022-09-07 MED ORDER — DEXTROSE 50 % IV SOLN
INTRAVENOUS | Status: AC
  Administered 2022-09-07: 50 mL
  Filled 2022-09-07: qty 50

## 2022-09-07 MED ORDER — SODIUM CHLORIDE 0.9 % IV SOLN: 250.0000 mL | INTRAVENOUS | Status: DC | PRN

## 2022-09-07 MED ORDER — SODIUM CHLORIDE 0.9% FLUSH
3.0000 mL | Freq: Two times a day (BID) | INTRAVENOUS | Status: DC
  Administered 2022-09-08 – 2022-09-09 (×3): 3 mL via INTRAVENOUS

## 2022-09-07 MED ORDER — SODIUM CHLORIDE 0.9% FLUSH: 3.0000 mL | INTRAVENOUS | Status: DC | PRN

## 2022-09-07 NOTE — ED Triage Notes (Signed)
Pt states blood sugar at home was 62. Was at CHF doctor yesterday and was called after leaving and told blood sugar was 29. Pt alert and oriented and denies any symptoms.

## 2022-09-07 NOTE — ED Notes (Signed)
PO OJ given to pt to drink. Pt A&O and shows no distress from hypoglycemia. EDP at bedside assessing pt.

## 2022-09-07 NOTE — ED Notes (Addendum)
Pt ambulated to the bathroom in room. Pt comfortable back in bed

## 2022-09-07 NOTE — Telephone Encounter (Signed)
  Chief Complaint: Low blood sugar Symptoms: none Frequency: today Pertinent Negatives: Patient denies  Disposition: ED /[] Urgent Care (no appt availability in office) / Appointment(In office/virtual)/  [facility]/ Home Care/ Refused Recommended Disposition /[] [facility]/  Follow-up with PCP Additional Notes: PT has had low blood sugar for more than 30 minutes without resolution. Pt will go to ED for care.    Reason for Disposition  [1] Low blood glucose (70 mg/dl [1.6 mmol/l] or below) persists > 30 minutes AND [2] using low blood sugar Care Advice  Answer Assessment - Initial Assessment Questions 1. SYMPTOMS: "What symptoms are you concerned about?"     Low blood sugar 2. ONSET:  "When did the symptoms start?"     Today 3. BLOOD GLUCOSE: "What is your blood glucose level?"      Was 55 at 3:30  - drank OJ 30 minutes later bs was 66. Blood sugar is now 66 4. USUAL RANGE: "What is your blood glucose level usually?" (e.g., usual fasting morning value, usual evening value)      5. TYPE 1 or 2:  "Do you know what type of diabetes you have?"  (e.g., Type 1, Type 2, Gestational; doesn't know)       6. INSULIN: "Do you take insulin?" "What type of insulin(s) do you use? What is the mode of delivery? (syringe, pen; injection or pump) "When did you last give yourself an insulin dose?" (i.e., time or hours/minutes ago) "How much did you give?" (i.e., how many units)      7. DIABETES PILLS: "Do you take any pills for your diabetes?" If Yes, ask: "What is the name of the medicine(s) that you take for high blood sugar?"      8. OTHER SYMPTOMS: "Do you have any symptoms?" (e.g., fever, frequent urination, difficulty breathing, vomiting)      9. LOW BLOOD GLUCOSE TREATMENT: "What have you done so far to treat the low blood glucose level?"     OJ 10. FOOD: "When did you last eat or drink?"       Oj nothing else today 11. ALONE: "Are you alone right now or is  someone with you?"        Sister is there  Protocols used: Diabetes - Low Blood Sugar-A-AH

## 2022-09-07 NOTE — ED Notes (Signed)
Pt placed on 2L [place]. Pt was moving around and O2 85 when got in the bed. Post O2 94%

## 2022-09-07 NOTE — ED Provider Notes (Signed)
[place] EMERGENCY DEPARTMENT AT [facility] Provider Note   CSN: [id number] Arrival date & time: 09/07/22  1759     History  Chief Complaint  Patient presents with   Hypoglycemia    Sandra Schneider is a 67 y.o. female.   Hypoglycemia    Patient has a history of hypertension diabetes, reflux, coronary artery disease, myocardial abscess, myocardial infarct, pulmonary hypertension, breast cancer.  Patient states she takes metformin and glipizide for her diabetes.  Patient is being followed by cardiology for her pulmonary hypertension and cardiac disease.  Patient has had some adjustments on her diuretic medications recently.  Patient has had some increasing weight gain.  Patient states as part of her evaluation yesterday she had laboratory tests.  She was notified that her blood sugar was low at 63.  Patient states she checked it today and at home and it was 21.  She tried drinking some juice and ate something and then rechecked her blood sugar.  Remained low so she came to the ED.  Patient states she did take her diabetes medications this morning.  She has not been eating as much because she is having some abdominal swelling with her fluid retention.  Otherwise she does not have any vomiting or diarrhea.  No fevers no chest pain.  Home Medications Prior to Admission medications   Medication Sig Start Date End Date Taking? Authorizing Provider  amLODipine (NORVASC) 10 MG tablet Take 1 tablet by mouth once daily 01/20/21   Antoine Poche, MD  Ascorbic Acid (VITAMIN C PO) Take 1 tablet by mouth daily.    [provider]  aspirin EC 81 MG tablet Take 81 mg by mouth daily.    [provider]  atorvastatin (LIPITOR) 40 MG tablet Take 1 tablet by mouth once daily 08/02/22   Antoine Poche, MD  chlorthalidone (HYGROTON) 25 MG tablet Take 1 tablet by mouth once daily 06/22/22   Antoine Poche, MD  Cholecalciferol (VITAMIN D) 50 MCG (2000 UT) CAPS Take  2,000 Units by mouth daily.    [provider]  dorzolamide-timolol (COSOPT) 22.3-6.8 MG/ML ophthalmic solution Place 1 drop into both eyes 2 (two) times daily.    [provider]  famotidine (PEPCID) 20 MG tablet Take 20 mg by mouth daily.    [provider]  furosemide (LASIX) 20 MG tablet Take 4 tablets (80 mg total) by mouth 2 (two) times daily. 09/06/22 09/06/23  Jacklynn Ganong, FNP  glipiZIDE (GLUCOTROL) 10 MG tablet Take 10 mg by mouth 2 (two) times daily before a meal.    [provider]  letrozole (FEMARA) 2.5 MG tablet Take 2.5 mg by mouth daily. 01/01/20   [provider]  lisinopril (ZESTRIL) 40 MG tablet Take 1 tablet by mouth once daily 04/27/22   Antoine Poche, MD  loratadine (CLARITIN) 10 MG tablet Take 10 mg by mouth daily.    [provider]  metFORMIN (GLUCOPHAGE) 1000 MG tablet Take 1 tablet (1,000 mg total) by mouth 2 (two) times daily. 02/09/22   Corky Crafts, MD  metolazone (ZAROXOLYN) 2.5 MG tablet Take 1 tablet (2.5 mg total) by mouth as directed. Take as instructed by the heart failure clinic Patient not taking: Reported on 09/06/2022 08/15/22   Sabharwal, Aditya, DO  metoprolol tartrate (LOPRESSOR) 50 MG tablet Take 50 mg by mouth 2 (two) times daily.    [provider]  Multiple Vitamin (MULTIVITAMIN) tablet Take 1 tablet by mouth  daily.    [provider]  potassium chloride (KLOR-CON M) 10 MEQ tablet Take 2 tablets (20 mEq total) by mouth daily. 09/06/22   Jacklynn Ganong, FNP  sildenafil (REVATIO) 20 MG tablet Take 1 tablet (20 mg total) by mouth 3 (three) times daily. 06/23/22   Sabharwal, Eliezer Lofts, DO      Allergies    Patient has no known allergies.    Review of Systems   Review of Systems  Physical Exam Updated Vital Signs BP 138/89   Pulse 65   Temp 99 F (37.2 C) (Oral)   Resp 20   Wt 132 kg   LMP 01/14/2011   SpO2 95%   BMI 48.42 kg/m  Physical Exam Vitals and  nursing note reviewed.  Constitutional:      Appearance: She is well-developed. She is not diaphoretic.  HENT:     Head: Normocephalic and atraumatic.     Right Ear: External ear normal.     Left Ear: External ear normal.  Eyes:     General: No scleral icterus.       Right eye: No discharge.        Left eye: No discharge.     Conjunctiva/sclera: Conjunctivae normal.  Neck:     Trachea: No tracheal deviation.  Cardiovascular:     Rate and Rhythm: Normal rate and regular rhythm.  Pulmonary:     Effort: Pulmonary effort is normal. No respiratory distress.     Breath sounds: Normal breath sounds. No stridor. No wheezing or rales.  Abdominal:     General: Bowel sounds are normal. There is no distension.     Palpations: Abdomen is soft.     Tenderness: There is no abdominal tenderness. There is no guarding or rebound.     Comments: Squeezing in her abdomen.  Musculoskeletal:        General: No tenderness or deformity.     Cervical back: Neck supple.     Right lower leg: Edema present.     Left lower leg: Edema present.  Skin:    General: Skin is warm and dry.     Findings: No rash.  Neurological:     General: No focal deficit present.     Mental Status: She is alert.     Cranial Nerves: No cranial nerve deficit, dysarthria or facial asymmetry.     Sensory: No sensory deficit.     Motor: No abnormal muscle tone or seizure activity.     Coordination: Coordination normal.  Psychiatric:        Mood and Affect: Mood normal.     ED Results / Procedures / Treatments   Labs (all labs ordered are listed, but only abnormal results are displayed) Labs Reviewed  COMPREHENSIVE METABOLIC PANEL - Abnormal; Notable for the following components:      Result Value   Potassium 3.4 (*)    Chloride 97 (*)    Glucose, Bld 127 (*)    BUN 63 (*)    Creatinine, Ser 1.99 (*)    GFR, Estimated 27 (*)    All other components within normal limits  CBC - Abnormal; Notable for the following  components:   RBC 3.59 (*)    Hemoglobin 11.5 (*)    MCV 103.1 (*)    All other components within normal limits  CBG MONITORING, ED - Abnormal; Notable for the following components:   Glucose-Capillary 44 (*)    All other components within normal limits  CBG  MONITORING, ED - Abnormal; Notable for the following components:   Glucose-Capillary 37 (*)    All other components within normal limits  CBG MONITORING, ED - Abnormal; Notable for the following components:   Glucose-Capillary 117 (*)    All other components within normal limits  CBG MONITORING, ED  CBG MONITORING, ED    EKG None  Radiology No results found.  Procedures Procedures    Medications Ordered in ED Medications  sodium chloride flush (NS) 0.9 % injection 3 mL (3 mLs Intravenous Not Given 09/07/22 2122)  sodium chloride flush (NS) 0.9 % injection 3 mL (has no administration in time range)  0.9 %  sodium chloride infusion (has no administration in time range)  dextrose 50 % solution (50 mLs  Given 09/07/22 1830)  dextrose 10 % infusion ( Intravenous New Bag/Given 09/07/22 1850)    ED Course/ Medical Decision Making/ A&P Clinical Course as of 09/07/22 2221  Thu Sep 07, 2022  2136 Comprehensive metabolic panel(!) Creatinine elevated, similar to previous.  CBC normal [JK]  2209 Blood sugar has remained stable on D10 drip.  Last blood sugar was 98.  Patient is alert and oriented [JK]  2220 Case discusssed with Dr Sheliah Plane regarding admission [JK]    Clinical Course User Index [JK] Linwood Dibbles, MD                             Medical Decision Making Problems Addressed: Chronic kidney disease, unspecified CKD stage: chronic illness or injury Hypoglycemia: acute illness or injury that poses a threat to life or bodily functions  Amount and/or Complexity of Data Reviewed Labs: ordered. Decision-making details documented in ED Course.  Risk Prescription drug management. Decision regarding  hospitalization.   Patient presented to the ED for evaluation of hypoglycemia.  Patient is on long-acting oral hypoglycemics.  She did take her medications this morning as she was not aware she had hypoglycemia on her outpatient laboratory test.  She had recurrent severe hypoglycemia requiring IV dextrose from EMS.  Considering the long-acting nature of her medications I will consult the medical service for admission and observation.        Final Clinical Impression(s) / ED Diagnoses Final diagnoses:  Hypoglycemia  Chronic kidney disease, unspecified CKD stage    Rx / DC Orders ED Discharge Orders     None         Linwood Dibbles, MD 09/07/22 2221

## 2022-09-08 ENCOUNTER — Observation Stay (HOSPITAL_BASED_OUTPATIENT_CLINIC_OR_DEPARTMENT_OTHER): Payer: Medicare Other

## 2022-09-08 ENCOUNTER — Observation Stay (HOSPITAL_COMMUNITY): Payer: Medicare Other

## 2022-09-08 ENCOUNTER — Encounter (HOSPITAL_COMMUNITY): Payer: Self-pay | Admitting: Family Medicine

## 2022-09-08 DIAGNOSIS — I10 Essential (primary) hypertension: Secondary | ICD-10-CM | POA: Diagnosis not present

## 2022-09-08 DIAGNOSIS — E1165 Type 2 diabetes mellitus with hyperglycemia: Secondary | ICD-10-CM

## 2022-09-08 DIAGNOSIS — R0600 Dyspnea, unspecified: Secondary | ICD-10-CM | POA: Diagnosis not present

## 2022-09-08 DIAGNOSIS — I2510 Atherosclerotic heart disease of native coronary artery without angina pectoris: Secondary | ICD-10-CM | POA: Diagnosis not present

## 2022-09-08 DIAGNOSIS — J811 Chronic pulmonary edema: Secondary | ICD-10-CM | POA: Diagnosis not present

## 2022-09-08 DIAGNOSIS — E782 Mixed hyperlipidemia: Secondary | ICD-10-CM | POA: Diagnosis not present

## 2022-09-08 DIAGNOSIS — E162 Hypoglycemia, unspecified: Secondary | ICD-10-CM | POA: Diagnosis not present

## 2022-09-08 DIAGNOSIS — I5033 Acute on chronic diastolic (congestive) heart failure: Secondary | ICD-10-CM

## 2022-09-08 DIAGNOSIS — R911 Solitary pulmonary nodule: Secondary | ICD-10-CM | POA: Diagnosis not present

## 2022-09-08 LAB — CBC WITH DIFFERENTIAL/PLATELET
Abs Immature Granulocytes: 0 10*3/uL (ref 0.00–0.07)
Basophils Absolute: 0 10*3/uL (ref 0.0–0.1)
Basophils Relative: 0 %
Eosinophils Absolute: 0.1 10*3/uL (ref 0.0–0.5)
Eosinophils Relative: 1 %
HCT: 37.1 % (ref 36.0–46.0)
Hemoglobin: 11.5 g/dL — ABNORMAL LOW (ref 12.0–15.0)
Immature Granulocytes: 0 %
Lymphocytes Relative: 9 %
Lymphs Abs: 0.6 10*3/uL — ABNORMAL LOW (ref 0.7–4.0)
MCH: 31.9 pg (ref 26.0–34.0)
MCHC: 31 g/dL (ref 30.0–36.0)
MCV: 103.1 fL — ABNORMAL HIGH (ref 80.0–100.0)
Monocytes Absolute: 0.7 10*3/uL (ref 0.1–1.0)
Monocytes Relative: 12 %
Neutro Abs: 4.8 10*3/uL (ref 1.7–7.7)
Neutrophils Relative %: 78 %
Platelets: 195 10*3/uL (ref 150–400)
RBC: 3.6 MIL/uL — ABNORMAL LOW (ref 3.87–5.11)
RDW: 15.2 % (ref 11.5–15.5)
WBC: 6.2 10*3/uL (ref 4.0–10.5)
nRBC: 0 % (ref 0.0–0.2)

## 2022-09-08 LAB — CBG MONITORING, ED
Glucose-Capillary: 194 mg/dL — ABNORMAL HIGH (ref 70–99)
Glucose-Capillary: 147 mg/dL — ABNORMAL HIGH (ref 70–99)
Glucose-Capillary: 247 mg/dL — ABNORMAL HIGH (ref 70–99)

## 2022-09-08 LAB — ECHOCARDIOGRAM LIMITED
Est EF: >75
Height: 65 in
S' Lateral: 1.5 cm
Weight: 4656 oz

## 2022-09-08 LAB — COMPREHENSIVE METABOLIC PANEL
ALT: 14 U/L (ref 0–44)
AST: 23 U/L (ref 15–41)
Albumin: 3.8 g/dL (ref 3.5–5.0)
Alkaline Phosphatase: 58 U/L (ref 38–126)
Anion gap: 11 (ref 5–15)
BUN: 61 mg/dL — ABNORMAL HIGH (ref 8–23)
CO2: 27 mmol/L (ref 22–32)
Calcium: 8.9 mg/dL (ref 8.9–10.3)
Chloride: 96 mmol/L — ABNORMAL LOW (ref 98–111)
Creatinine, Ser: 1.77 mg/dL — ABNORMAL HIGH (ref 0.44–1.00)
GFR, Estimated: 31 mL/min — ABNORMAL LOW (ref >60)
Glucose, Bld: 170 mg/dL — ABNORMAL HIGH (ref 70–99)
Potassium: 3.5 mmol/L (ref 3.5–5.1)
Sodium: 134 mmol/L — ABNORMAL LOW (ref 135–145)
Total Bilirubin: 1.2 mg/dL (ref 0.3–1.2)
Total Protein: 6.9 g/dL (ref 6.5–8.1)

## 2022-09-08 LAB — HEMOGLOBIN A1C
Hgb A1c MFr Bld: 5.4 % (ref 4.8–5.6)
Mean Plasma Glucose: 108.28 mg/dL

## 2022-09-08 LAB — GLUCOSE, CAPILLARY
Glucose-Capillary: 254 mg/dL — ABNORMAL HIGH (ref 70–99)
Glucose-Capillary: 278 mg/dL — ABNORMAL HIGH (ref 70–99)
Glucose-Capillary: 294 mg/dL — ABNORMAL HIGH (ref 70–99)
Glucose-Capillary: 316 mg/dL — ABNORMAL HIGH (ref 70–99)

## 2022-09-08 LAB — MAGNESIUM: Magnesium: 2 mg/dL (ref 1.7–2.4)

## 2022-09-08 MED ORDER — INSULIN ASPART 100 UNIT/ML IJ SOLN
0.0000 [IU] | Freq: Three times a day (TID) | INTRAMUSCULAR | Status: DC
  Administered 2022-09-08: 3 [IU] via SUBCUTANEOUS
  Administered 2022-09-08: 5 [IU] via SUBCUTANEOUS
  Administered 2022-09-09: 3 [IU] via SUBCUTANEOUS
  Administered 2022-09-09: 2 [IU] via SUBCUTANEOUS
  Filled 2022-09-08: qty 1

## 2022-09-08 MED ORDER — ONDANSETRON HCL 4 MG/2ML IJ SOLN: 4.0000 mg | Freq: Four times a day (QID) | INTRAMUSCULAR | Status: DC | PRN

## 2022-09-08 MED ORDER — METOPROLOL TARTRATE 50 MG PO TABS
50.0000 mg | ORAL_TABLET | Freq: Two times a day (BID) | ORAL | Status: DC
  Administered 2022-09-08 – 2022-09-09 (×4): 50 mg via ORAL
  Filled 2022-09-08 (×4): qty 1

## 2022-09-08 MED ORDER — OXYCODONE HCL 5 MG PO TABS: 5.0000 mg | ORAL_TABLET | Freq: Four times a day (QID) | ORAL | Status: DC | PRN

## 2022-09-08 MED ORDER — ACETAMINOPHEN 650 MG RE SUPP: 650.0000 mg | Freq: Four times a day (QID) | RECTAL | Status: DC | PRN

## 2022-09-08 MED ORDER — ASPIRIN 81 MG PO TBEC
81.0000 mg | DELAYED_RELEASE_TABLET | Freq: Every day | ORAL | Status: DC
  Administered 2022-09-08 – 2022-09-09 (×2): 81 mg via ORAL
  Filled 2022-09-08 (×2): qty 1

## 2022-09-08 MED ORDER — OXYCODONE HCL 5 MG PO TABS: 5.0000 mg | ORAL_TABLET | ORAL | Status: DC | PRN

## 2022-09-08 MED ORDER — ISOSORB DINITRATE-HYDRALAZINE 20-37.5 MG PO TABS
1.0000 | ORAL_TABLET | Freq: Two times a day (BID) | ORAL | Status: DC
  Administered 2022-09-08 – 2022-09-09 (×3): 1 via ORAL
  Filled 2022-09-08 (×9): qty 1

## 2022-09-08 MED ORDER — FUROSEMIDE 10 MG/ML IJ SOLN
60.0000 mg | Freq: Once | INTRAMUSCULAR | Status: AC
  Administered 2022-09-08: 60 mg via INTRAVENOUS
  Filled 2022-09-08: qty 6

## 2022-09-08 MED ORDER — ATORVASTATIN CALCIUM 40 MG PO TABS
40.0000 mg | ORAL_TABLET | Freq: Every day | ORAL | Status: DC
  Administered 2022-09-08 – 2022-09-09 (×2): 40 mg via ORAL
  Filled 2022-09-08 (×2): qty 1

## 2022-09-08 MED ORDER — FUROSEMIDE 10 MG/ML IJ SOLN: 40.0000 mg | Freq: Once | INTRAMUSCULAR | Status: DC

## 2022-09-08 MED ORDER — HEPARIN SODIUM (PORCINE) 5000 UNIT/ML IJ SOLN
5000.0000 [IU] | Freq: Three times a day (TID) | INTRAMUSCULAR | Status: DC
  Administered 2022-09-08 – 2022-09-09 (×4): 5000 [IU] via SUBCUTANEOUS
  Filled 2022-09-08 (×5): qty 1

## 2022-09-08 MED ORDER — FAMOTIDINE 20 MG PO TABS
20.0000 mg | ORAL_TABLET | Freq: Every day | ORAL | Status: DC
  Administered 2022-09-08 – 2022-09-09 (×2): 20 mg via ORAL
  Filled 2022-09-08 (×2): qty 1

## 2022-09-08 MED ORDER — ACETAMINOPHEN 325 MG PO TABS: 650.0000 mg | ORAL_TABLET | Freq: Four times a day (QID) | ORAL | Status: DC | PRN

## 2022-09-08 MED ORDER — ONDANSETRON HCL 4 MG PO TABS: 4.0000 mg | ORAL_TABLET | Freq: Four times a day (QID) | ORAL | Status: DC | PRN

## 2022-09-08 MED ORDER — POTASSIUM CHLORIDE 20 MEQ PO PACK
40.0000 meq | PACK | Freq: Once | ORAL | Status: AC
  Administered 2022-09-08: 40 meq via ORAL
  Filled 2022-09-08: qty 2

## 2022-09-08 MED ORDER — CHLORTHALIDONE 25 MG PO TABS: 25.0000 mg | ORAL_TABLET | Freq: Every day | ORAL | Status: DC

## 2022-09-08 MED ORDER — FUROSEMIDE 40 MG PO TABS
60.0000 mg | ORAL_TABLET | Freq: Two times a day (BID) | ORAL | Status: DC
  Administered 2022-09-08 – 2022-09-09 (×2): 60 mg via ORAL
  Filled 2022-09-08 (×2): qty 1

## 2022-09-08 MED ORDER — MORPHINE SULFATE (PF) 2 MG/ML IV SOLN: 2.0000 mg | INTRAVENOUS | Status: DC | PRN

## 2022-09-08 MED ORDER — AMLODIPINE BESYLATE 5 MG PO TABS: 10.0000 mg | ORAL_TABLET | Freq: Every day | ORAL | Status: DC

## 2022-09-08 MED ORDER — POTASSIUM CHLORIDE CRYS ER 20 MEQ PO TBCR
40.0000 meq | EXTENDED_RELEASE_TABLET | Freq: Every day | ORAL | Status: DC
  Administered 2022-09-08 – 2022-09-09 (×2): 40 meq via ORAL
  Filled 2022-09-08 (×2): qty 2

## 2022-09-08 NOTE — ED Notes (Signed)
Pt's preference at this time is to sit on the edge of the bed in anticipation of needing to urinate more frequently d/t getting lasix

## 2022-09-08 NOTE — ED Notes (Signed)
Attempted report 

## 2022-09-08 NOTE — Progress Notes (Signed)
  Echocardiogram 2D Echocardiogram has been performed.  Maren Reamer 09/08/2022, 2:10 PM

## 2022-09-08 NOTE — Assessment & Plan Note (Signed)
-   Patient had asymptomatic hypoglycemia -Patient's A1c 5.7; not requiring aggressive hypoglycemic regimen at the moment. -Patient advised to maintain adequate hydration and to follow modified carbohydrate diet -Glipizide has been discontinue and patient asked to continue just using metformin. -Continue monitoring CBGs closely, follow fluctuation and repeat A1c as an outpatient to further determine hypoglycemic regimen.

## 2022-09-08 NOTE — Care Management Obs Status (Signed)
MEDICARE OBSERVATION STATUS NOTIFICATION   Patient Details  Name: Sandra Schneider MRN: [id number] Date of Birth: [birth year]   Medicare Observation Status Notification Given:  Yes    Corey Harold 09/08/2022, 4:54 PM

## 2022-09-08 NOTE — ED Notes (Signed)
Pt ambulating in room due to being uncomfortable in bed. Pt given recliner at this time.

## 2022-09-08 NOTE — Assessment & Plan Note (Signed)
-   Stable and well-controlled with current antihypertensive agents -Heart healthy diet discussed with patient. -Continue to follow vital signs.

## 2022-09-08 NOTE — Assessment & Plan Note (Addendum)
-   Holding glipizide secondary to hypoglycemia - Monitoring CBG - Check hemoglobin A1c - Will likely need to discontinue glipizide at discharge

## 2022-09-08 NOTE — Assessment & Plan Note (Signed)
Continue statin 

## 2022-09-08 NOTE — Progress Notes (Signed)
  Transition of Care [facility]) Screening Note   Patient Details  Name: Sandra Schneider Date of Birth: Dec 04, 1955   Transition of Care [facility]) CM/SW Contact:    Annice Needy, LCSW Phone Number: 09/08/2022, 11:37 AM    Transition of Care Department [facility]) has reviewed patient and no TOC needs have been identified at this time. We will continue to monitor patient advancement through interdisciplinary progression rounds. If new patient transition needs arise, please place a TOC consult.

## 2022-09-08 NOTE — Assessment & Plan Note (Signed)
-   Patient reports dyspnea on exertion, orthopnea, fluid retention. -After curbside and cardiology service medication regimen has been adjusted and patient discharged home on Lasix 60 mg twice a day. -She will continue close follow-up with cardiology service/heart failure team to further adjust her medications. -Daily weights, adequate hydration and low-sodium diet discussed with patient.

## 2022-09-08 NOTE — Progress Notes (Signed)
Patient seen and examined; admitted after midnight secondary to hypoglycemia.  Has also been struggling prior to admission with fluid buildup/weight gain and increased shortness of breath.  Patient with underlying history of diastolic heart failure and recent diuretic adjustment to help with ongoing diuresis.  Continue close monitoring of patient's intake/output, daily weights and low-sodium diet.  Refer to H&P written by Dr. Carren Rang for further indo/details on admission.  Plan: -Follow Reds clip measurement -Resume cardiac medication regimen and assess response -Follow renal function and electrolytes -If CBGs remained stable will discharge home tomorrow with close outpatient follow-up with heart failure service as an outpatient.  Vassie Loll MD [phone]

## 2022-09-08 NOTE — Assessment & Plan Note (Signed)
-   Continue aspirin, statin, beta-blocker - Patient denies chest pain, palpitations, shortness of breath and diaphoresis at discharge. -Continue patient follow-up with cardiology service.

## 2022-09-08 NOTE — ED Notes (Signed)
Report given to 300. 

## 2022-09-08 NOTE — ED Notes (Signed)
Pt denies feeling sob. Placed O2 Clifford within reach of pt should pt need it. Pt wants to be able to walk around room. Will continue pt care

## 2022-09-08 NOTE — ED Notes (Signed)
Pt given orange juice, peanut butter and graham crackers 

## 2022-09-08 NOTE — H&P (Signed)
History and Physical    Patient: Sandra Schneider ZHY:[id number] DOB: 14-Mar-1956 DOA: 09/07/2022 DOS: the patient was seen and examined on 09/08/2022 PCP: [facility]  Patient coming from: Home  Chief Complaint:  Chief Complaint  Patient presents with   Hypoglycemia   HPI: Sandra Schneider is a 67 y.o. female with medical history significant of breast cancer, coronary artery disease, diabetes mellitus type 2, GERD, hypertension relatively newly diagnosed diastolic CHF, and more presents ED with a chief complaint of hypoglycemia.  Patient reports she was at a routine doctor's appointment where they did labs.  She was called and notified that her glucose was low.  At that time was 29.  They had checked her glucose and it was 55.  She drank some orange juice and checked it again it was 66.  30 minutes later was back down to 57.  Patient reports that normally she only checks her glucose every 2 or 3 days.  It has been running 71-101 at baseline per her report.  She reports that it does not get down to 71 that often.  Again patient is asymptomatic.  She was advised to come into the ER.  She does report that she took her glipizide on the 25th.  Regarding her glucose, patient has no other complaints at this time.  Patient does report that she has been doing diuretic adjustments secondary to her creatinine fluctuations in the outpatient setting.  She reports that when she was first diagnosed with CHF she gained 20 pounds in 2 weeks.  She feels like she is fluid overloaded again.  Her legs that do not have that much fluid in them but she feels like it is on her belly.  She does appear to have somewhat distended belly.  Patient reports dyspnea on exertion, orthopnea, and fluid retention.  She denies any chest pain, palpitations.  Patient does not wear CPAP at home, but likely needs to.  She reports she has a sleep study coming up.  Patient has no other complaints at this  time.  Patient does not smoke, does not drink, does not use illicit drugs.  She is vaccinated for COVID and flu.  Patient is full code. Review of Systems: As mentioned in the history of present illness. All other systems reviewed and are negative. Past Medical History:  Diagnosis Date   Breast cancer (HCC)    Coronary artery disease    Diabetes mellitus    GERD (gastroesophageal reflux disease)    Hypertension    Myocardial abscess    Myocardial infarct (HCC) 06/24/2012   Pulmonary hypertension (HCC)    Past Surgical History:  Procedure Laterality Date   ANTERIOR VITRECTOMY Left 07/21/2019   Procedure: ANTERIOR VITRECTOMY;  Surgeon: Fabio Pierce, MD;  Location: [facility];  Service: Ophthalmology;  Laterality: Left;   ANTERIOR VITRECTOMY Right 08/04/2019   Procedure: ANTERIOR VITRECTOMY;  Surgeon: Fabio Pierce, MD;  Location: [facility];  Service: Ophthalmology;  Laterality: Right;   CARDIAC CATHETERIZATION     CATARACT EXTRACTION W/PHACO Left 07/21/2019   Procedure: CATARACT EXTRACTION PHACO AND INTRAOCULAR LENS PLACEMENT (IOC) (CDE: 11.45);  Surgeon: Fabio Pierce, MD;  Location: [facility];  Service: Ophthalmology;  Laterality: Left;   CATARACT EXTRACTION W/PHACO Right 08/04/2019   Procedure: CATARACT EXTRACTION PHACO AND INTRAOCULAR LENS PLACEMENT (IOC);  Surgeon: Fabio Pierce, MD;  Location: [facility];  Service: Ophthalmology;  Laterality: Right;  CDE: 9.78   CESAREAN SECTION     COLONOSCOPY WITH PROPOFOL N/A  05/22/2022   Procedure: COLONOSCOPY WITH PROPOFOL;  Surgeon: Lanelle Bal, DO;  Location: [facility];  Service: Endoscopy;  Laterality: N/A;  10:00 am   CORONARY STENT PLACEMENT N/A 06/24/2012   LEFT HEART CATHETERIZATION WITH CORONARY ANGIOGRAM N/A 06/24/2012   Procedure: LEFT HEART CATHETERIZATION WITH CORONARY ANGIOGRAM;  Surgeon: Dolores Patty, MD;  Location: [facility] CATH LAB;  Service: Cardiovascular;  Laterality: N/A;   right breast lumpectomy     RIGHT/LEFT HEART CATH  AND CORONARY ANGIOGRAPHY N/A 02/07/2022   Procedure: RIGHT/LEFT HEART CATH AND CORONARY ANGIOGRAPHY;  Surgeon: Corky Crafts, MD;  Location: [facility] INVASIVE CV LAB;  Service: Cardiovascular;  Laterality: N/A;   Social History:  reports that she has never smoked. She has never been exposed to tobacco smoke. She has never used smokeless tobacco. She reports that she does not drink alcohol and does not use drugs.  No Known Allergies  Family History  Problem Relation Age of Onset   Heart disease Father    Diabetes Other    Hypertension Other    Cancer Other    Colon cancer Son 22       alive, doing well.    Prior to Admission medications   Medication Sig Start Date End Date Taking? Authorizing Provider  amLODipine (NORVASC) 10 MG tablet Take 1 tablet by mouth once daily 01/20/21   Antoine Poche, MD  Ascorbic Acid (VITAMIN C PO) Take 1 tablet by mouth daily.    [provider]  aspirin EC 81 MG tablet Take 81 mg by mouth daily.    [provider]  atorvastatin (LIPITOR) 40 MG tablet Take 1 tablet by mouth once daily 08/02/22   Antoine Poche, MD  chlorthalidone (HYGROTON) 25 MG tablet Take 1 tablet by mouth once daily 06/22/22   Antoine Poche, MD  Cholecalciferol (VITAMIN D) 50 MCG (2000 UT) CAPS Take 2,000 Units by mouth daily.    [provider]  dorzolamide-timolol (COSOPT) 22.3-6.8 MG/ML ophthalmic solution Place 1 drop into both eyes 2 (two) times daily.    [provider]  famotidine (PEPCID) 20 MG tablet Take 20 mg by mouth daily.    [provider]  furosemide (LASIX) 20 MG tablet Take 4 tablets (80 mg total) by mouth 2 (two) times daily. 09/06/22 09/06/23  Jacklynn Ganong, FNP  glipiZIDE (GLUCOTROL) 10 MG tablet Take 10 mg by mouth 2 (two) times daily before a meal.    [provider]  letrozole (FEMARA) 2.5 MG tablet Take 2.5 mg by mouth daily. 01/01/20   [provider]  lisinopril (ZESTRIL) 40 MG tablet  Take 1 tablet by mouth once daily 04/27/22   Antoine Poche, MD  loratadine (CLARITIN) 10 MG tablet Take 10 mg by mouth daily.    [provider]  metFORMIN (GLUCOPHAGE) 1000 MG tablet Take 1 tablet (1,000 mg total) by mouth 2 (two) times daily. 02/09/22   Corky Crafts, MD  metolazone (ZAROXOLYN) 2.5 MG tablet Take 1 tablet (2.5 mg total) by mouth as directed. Take as instructed by the heart failure clinic Patient not taking: Reported on 09/06/2022 08/15/22   Sabharwal, Aditya, DO  metoprolol tartrate (LOPRESSOR) 50 MG tablet Take 50 mg by mouth 2 (two) times daily.    [provider]  Multiple Vitamin (MULTIVITAMIN) tablet Take 1 tablet by mouth daily.    [provider]  potassium chloride (KLOR-CON M) 10 MEQ tablet Take 2 tablets (20 mEq total) by mouth  daily. 09/06/22   Jacklynn Ganong, FNP  sildenafil (REVATIO) 20 MG tablet Take 1 tablet (20 mg total) by mouth 3 (three) times daily. 06/23/22   Dorthula Nettles, DO    Physical Exam: Vitals:   09/08/22 0140 09/08/22 0141 09/08/22 0230 09/08/22 0300  BP:   132/63 134/67  Pulse:  67    Resp:      Temp: 97.8 F (36.6 C)     TempSrc: Oral     SpO2:  93%    Weight:       1.  General: Patient lying supine in bed,  no acute distress   2. Psychiatric: Alert and oriented x 3, mood and behavior normal for situation, pleasant and cooperative with exam   3. Neurologic: Speech and language are normal, face is symmetric, moves all 4 extremities voluntarily, at baseline without acute deficits on limited exam   4. HEENMT:  Head is atraumatic, normocephalic, pupils reactive to light, neck is supple, trachea is midline, mucous membranes are moist   5. Respiratory : Lungs are clear to auscultation bilaterally without wheezing, rhonchi, rales, no cyanosis, no increase in work of breathing or accessory muscle use   6. Cardiovascular : Heart rate normal, rhythm is regular, no murmurs, rubs or gallops, 1+  peripheral edema, peripheral pulses palpated   7. Gastrointestinal:  Abdomen is soft, abdomen is taut with fluid on palpation, nontender to palpation bowel sounds active, no masses or organomegaly palpated   8. Skin:  Skin is warm, dry and intact without rashes, acute lesions, or ulcers on limited exam   9.Musculoskeletal:  No acute deformities or trauma, no asymmetry in tone, 1+ edema peripherally, peripheral pulses palpated, no tenderness to palpation in the extremities  Data Reviewed: In the ED Temp 99, heart rate 64-69, respiratory rate 20-21, blood pressure 115/61-160/99, satting 85-95% No leukocytosis with white blood cell count of 5.5, he will 23 Slight hypokalemia at 3.4 likely to worsen with Lasix, potassium ordered Close in the ED is 44, 37, after D10 was started on 117, 82, 98 Admission requested for observation of hypoglycemia while glipizide washes out  Assessment and Plan: * Hypoglycemia - Patient had asymptomatic hypoglycemia - She reports that her glucose has been running between 70 and 100 at home - She had routine labs in the outpatient setting that showed a low glucose of 29 - Her glucose remained low despite p.o. intake of high sugar foods - In the ED she had to be started on a D10 drip, but that has stabilized her glucose - Likely the culprit is glipizide - Likely to discontinue glipizide at discharge - Holding glipizide while in the hospital - Continue to monitor  Acute on chronic diastolic CHF (congestive heart failure) (HCC) - Patient reports dyspnea on exertion, orthopnea, fluid retention - Last echo was in September 2023 and showed diastolic CHF - Outpatient adjustments to her diuretics have been being made secondary to creatinine fluctuations - One-time dose of 60 mg IV Lasix given at admission - Monitor intake and output - Reds clip every morning - Continue beta-blocker, aspirin, statin, chlorthalidone - Update echo - Monitor on telemetry  Type 2  diabetes mellitus with hyperglycemia (HCC) - Holding glipizide secondary to hypoglycemia - Monitoring CBG - Check hemoglobin A1c - Will likely need to discontinue glipizide at discharge  Hyperlipidemia - Continue statin  Essential (primary) hypertension - Continue Norvasc Lopressor  Atherosclerotic heart disease of native coronary artery without angina pectoris - Continue aspirin, statin, beta-blocker -  Monitor on telemetry      Advance Care Planning:   Code Status: Full Code  Consults: None at this time  Family Communication: No family at bedside  Severity of Illness: The appropriate patient status for this patient is OBSERVATION. Observation status is judged to be reasonable and necessary in order to provide the required intensity of service to ensure the patient's safety. The patient's presenting symptoms, physical exam findings, and initial radiographic and laboratory data in the context of their medical condition is felt to place them at decreased risk for further clinical deterioration. Furthermore, it is anticipated that the patient will be medically stable for discharge from the hospital within 2 midnights of admission.   Author: Lilyan Gilford, DO 09/08/2022 6:03 AM  For on call review [email or web address].

## 2022-09-09 DIAGNOSIS — I2510 Atherosclerotic heart disease of native coronary artery without angina pectoris: Secondary | ICD-10-CM | POA: Diagnosis not present

## 2022-09-09 DIAGNOSIS — N189 Chronic kidney disease, unspecified: Secondary | ICD-10-CM

## 2022-09-09 DIAGNOSIS — I10 Essential (primary) hypertension: Secondary | ICD-10-CM | POA: Diagnosis not present

## 2022-09-09 DIAGNOSIS — E162 Hypoglycemia, unspecified: Secondary | ICD-10-CM | POA: Diagnosis not present

## 2022-09-09 DIAGNOSIS — E1165 Type 2 diabetes mellitus with hyperglycemia: Secondary | ICD-10-CM | POA: Diagnosis not present

## 2022-09-09 DIAGNOSIS — I5033 Acute on chronic diastolic (congestive) heart failure: Secondary | ICD-10-CM | POA: Diagnosis not present

## 2022-09-09 DIAGNOSIS — N184 Chronic kidney disease, stage 4 (severe): Secondary | ICD-10-CM

## 2022-09-09 DIAGNOSIS — E782 Mixed hyperlipidemia: Secondary | ICD-10-CM | POA: Diagnosis not present

## 2022-09-09 LAB — BASIC METABOLIC PANEL
Anion gap: 8 (ref 5–15)
BUN: 61 mg/dL — ABNORMAL HIGH (ref 8–23)
CO2: 28 mmol/L (ref 22–32)
Calcium: 9.2 mg/dL (ref 8.9–10.3)
Chloride: 98 mmol/L (ref 98–111)
Creatinine, Ser: 1.79 mg/dL — ABNORMAL HIGH (ref 0.44–1.00)
GFR, Estimated: 31 mL/min — ABNORMAL LOW (ref >60)
Glucose, Bld: 182 mg/dL — ABNORMAL HIGH (ref 70–99)
Potassium: 4.1 mmol/L (ref 3.5–5.1)
Sodium: 134 mmol/L — ABNORMAL LOW (ref 135–145)

## 2022-09-09 LAB — GLUCOSE, CAPILLARY
Glucose-Capillary: 190 mg/dL — ABNORMAL HIGH (ref 70–99)
Glucose-Capillary: 189 mg/dL — ABNORMAL HIGH (ref 70–99)
Glucose-Capillary: 233 mg/dL — ABNORMAL HIGH (ref 70–99)

## 2022-09-09 MED ORDER — METFORMIN HCL 1000 MG PO TABS: 500.0000 mg | ORAL_TABLET | Freq: Two times a day (BID) | ORAL | Status: DC

## 2022-09-09 MED ORDER — FUROSEMIDE 20 MG PO TABS: 60.0000 mg | ORAL_TABLET | Freq: Two times a day (BID) | ORAL | 2 refills | Status: DC

## 2022-09-09 MED ORDER — ISOSORB DINITRATE-HYDRALAZINE 20-37.5 MG PO TABS: 1.0000 | ORAL_TABLET | Freq: Two times a day (BID) | ORAL | 2 refills | Status: DC

## 2022-09-09 NOTE — Assessment & Plan Note (Signed)
-  Stable and at baseline at time of discharge -Patient with chronic kidney disease stage IIIb at baseline. -Continue to closely follow electrolytes and renal function trend. -Heart healthy diet and adequate hydration discussed with patient.

## 2022-09-09 NOTE — Discharge Summary (Signed)
Physician Discharge Summary   Patient: Sandra Schneider MRN: [id number] DOB: [birth year]  Admit date:     09/07/2022  Discharge date: 09/09/22  Discharge Physician: Vassie Loll   PCP: [facility]   Recommendations at discharge:  Continue close monitoring of patient's CBGs/A1c with further adjustment to hypoglycemia regimen as needed Repeat basic metabolic panel to follow electrolytes and renal function Make sure patient has follow-up with cardiology service for further adjustment to medications and management of her heart failure. Reassess blood pressure and adjust antihypertensive regimen as needed.  Discharge Diagnoses: Principal Problem:   Hypoglycemia Active Problems:   Atherosclerotic heart disease of native coronary artery without angina pectoris   Essential (primary) hypertension   Hyperlipidemia   Type 2 diabetes mellitus with hyperglycemia (HCC)   Acute on chronic diastolic CHF (congestive heart failure) (HCC)   Chronic kidney disease  Brief Hospital admission course: As per H&P written by Dr. Dorthula Perfect- Camillo Flaming on 09/08/22 Sandra Schneider is a 67 y.o. female with medical history significant of breast cancer, coronary artery disease, diabetes mellitus type 2, GERD, hypertension relatively newly diagnosed diastolic CHF, and more presents ED with a chief complaint of hypoglycemia.  Patient reports she was at a routine doctor's appointment where they did labs.  She was called and notified that her glucose was low.  At that time was 29.  They had checked her glucose and it was 55.  She drank some orange juice and checked it again it was 66.  30 minutes later was back down to 57.  Patient reports that normally she only checks her glucose every 2 or 3 days.  It has been running 71-101 at baseline per her report.  She reports that it does not get down to 71 that often.  Again patient is asymptomatic.  She was advised to come into the ER.  She does report  that she took her glipizide on the 25th.  Regarding her glucose, patient has no other complaints at this time.   Patient does report that she has been doing diuretic adjustments secondary to her creatinine fluctuations in the outpatient setting.  She reports that when she was first diagnosed with CHF she gained 20 pounds in 2 weeks.  She feels like she is fluid overloaded again.  Her legs that do not have that much fluid in them but she feels like it is on her belly.  She does appear to have somewhat distended belly.  Patient reports dyspnea on exertion, orthopnea, and fluid retention.  She denies any chest pain, palpitations.  Patient does not wear CPAP at home, but likely needs to.  She reports she has a sleep study coming up.  Patient has no other complaints at this time.   Patient does not smoke, does not drink, does not use illicit drugs.  She is vaccinated for COVID and flu.   Assessment and Plan: * Hypoglycemia - Patient had asymptomatic hypoglycemia -Patient's A1c 5.7; not requiring aggressive hypoglycemic regimen at the moment. -Patient advised to maintain adequate hydration and to follow modified carbohydrate diet -Glipizide has been discontinue and patient asked to continue just using metformin. -Continue monitoring CBGs closely, follow fluctuation and repeat A1c as an outpatient to further determine hypoglycemic regimen.  Chronic kidney disease -Stable and at baseline at time of discharge -Patient with chronic kidney disease stage IIIb at baseline. -Continue to closely follow electrolytes and renal function trend. -Heart healthy diet and adequate hydration discussed with patient.  Acute on chronic  diastolic CHF (congestive heart failure) (HCC) - Patient reports dyspnea on exertion, orthopnea, fluid retention. -After curbside and cardiology service medication regimen has been adjusted and patient discharged home on Lasix 60 mg twice a day. -She will continue close follow-up with  cardiology service/heart failure team to further adjust her medications. -Daily weights, adequate hydration and low-sodium diet discussed with patient.   Type 2 diabetes mellitus with hyperglycemia (HCC) - A1c 5.7 -Glipizide discontinued at discharge as mentioned above -Continue the use of metformin and adjusted dose as part of hypoglycemic regimen. -Patient advised to follow modified carbohydrate diet and maintain adequate hydration.  Hyperlipidemia - Continue statin -Heart healthy diet discussed with patient.  Essential (primary) hypertension - Stable and well-controlled with current antihypertensive agents -Heart healthy diet discussed with patient. -Continue to follow vital signs.  Atherosclerotic heart disease of native coronary artery without angina pectoris - Continue aspirin, statin, beta-blocker - Patient denies chest pain, palpitations, shortness of breath and diaphoresis at discharge. -Continue patient follow-up with cardiology service.   Class 3 obesity -Body mass index is 48.87 kg/m. -Low-calorie diet, portion control and increase physical activity discussed with patient.  Consultants: Cardiology service curbside. Procedures performed: See below for x-ray reports. Disposition: Home Diet recommendation: Heart healthy/low calorie diet and modified carbohydrates.   DISCHARGE MEDICATION: Allergies as of 09/09/2022   No Known Allergies      Medication List     STOP taking these medications    amLODipine 10 MG tablet Commonly known as: NORVASC   chlorthalidone 25 MG tablet Commonly known as: HYGROTON   glipiZIDE 10 MG tablet Commonly known as: GLUCOTROL   lisinopril 40 MG tablet Commonly known as: ZESTRIL   metolazone 2.5 MG tablet Commonly known as: ZAROXOLYN       TAKE these medications    aspirin EC 81 MG tablet Take 81 mg by mouth daily.   atorvastatin 40 MG tablet Commonly known as: LIPITOR Take 1 tablet by mouth once daily    dorzolamide-timolol 2-0.5 % ophthalmic solution Commonly known as: COSOPT Place 1 drop into both eyes 2 (two) times daily.   famotidine 20 MG tablet Commonly known as: PEPCID Take 20 mg by mouth daily.   furosemide 20 MG tablet Commonly known as: LASIX Take 3 tablets (60 mg total) by mouth 2 (two) times daily. What changed: how much to take   isosorbide-hydrALAZINE 20-37.5 MG tablet Commonly known as: BIDIL Take 1 tablet by mouth 2 (two) times daily.   letrozole 2.5 MG tablet Commonly known as: FEMARA Take 2.5 mg by mouth daily.   loratadine 10 MG tablet Commonly known as: CLARITIN Take 10 mg by mouth daily.   metFORMIN 1000 MG tablet Commonly known as: GLUCOPHAGE Take 0.5 tablets (500 mg total) by mouth 2 (two) times daily with a meal. What changed:  how much to take when to take this   metoprolol tartrate 50 MG tablet Commonly known as: LOPRESSOR Take 50 mg by mouth 2 (two) times daily.   multivitamin tablet Take 1 tablet by mouth daily.   potassium chloride 10 MEQ tablet Commonly known as: KLOR-CON M Take 2 tablets (20 mEq total) by mouth daily.   sildenafil 20 MG tablet Commonly known as: REVATIO Take 1 tablet (20 mg total) by mouth 3 (three) times daily.   VITAMIN C PO Take 1 tablet by mouth daily.   Vitamin D 50 MCG (2000 UT) Caps Take 2,000 Units by mouth daily.        Follow-up Information  [facility]. Schedule an appointment as soon as possible for a visit in 2 week(s).   Contact information: [address] [place] [address] [phone]                Discharge Exam: Ceasar Mons Weights   09/07/22 1805 09/09/22 0329  Weight: 132 kg 133.2 kg   General exam: Alert, awake, oriented x 3; following commands appropriately; no chest pain, no nausea, no vomiting.  Reports improvement in her breathing and denies orthopnea. Respiratory system: No frank crackles; decreased breath sounds at the bases.  No  wheezing, no using accessory muscles; good saturation on room air. Cardiovascular system: No rubs, no gallops, rate controlled; no JVD on exam. Gastrointestinal system: Abdomen is obese: Nondistended, soft and nontender. No organomegaly or masses felt. Normal bowel sounds heard. Central nervous system: Alert and oriented. No focal neurological deficits. Extremities: No cyanosis or clubbing. Skin: No petechiae. Psychiatry: Judgement and insight appear normal. Mood & affect appropriate.    Condition at discharge: Stable and improved.  The results of significant diagnostics from this hospitalization (including imaging, microbiology, ancillary and laboratory) are listed below for reference.   Imaging Studies: ECHOCARDIOGRAM LIMITED  Result Date: 09/08/2022    ECHOCARDIOGRAM LIMITED REPORT   Patient Name:   VANDELLA ORD Date of Exam: 09/08/2022 Medical Rec #:  [id number]              Height:       65.0 in Accession #:    [id number]             Weight:       291.0 lb Date of Birth:  03-28-1956               BSA:          2.320 m Patient Age:    67 years               BP:           126/62 mmHg Patient Gender: F                      HR:           67 bpm. [facility]am [facility] Procedure: Limited Echo, Cardiac Doppler and Limited Color Doppler Indications:    Congestive Heart Failure I50.9  History:        Patient has prior history of Echocardiogram examinations, most                 recent 07/25/2022. CHF, CAD and Previous Myocardial Infarction,                 Pulmonary HTN, Signs/Symptoms:Shortness of Breath; Risk                 Factors:Hypertension, Dyslipidemia, Diabetes and Non-Smoker.  Sonographer:    Aron Baba Referring Phys: [id number] ASIA B ZIERLE-GHOSH  Sonographer Comments: Image acquisition challenging due to patient body habitus and Image acquisition challenging due to respiratory motion. IMPRESSIONS  1. Limited study.  2. Left ventricular ejection fraction, by estimation, is >75%.  The left ventricle has hyperdynamic function. The left ventricle has no regional wall motion abnormalities. There is the interventricular septum is flattened in systole and diastole, consistent with right ventricular pressure and volume overload.  3. Right ventricular systolic function is moderately reduced. The right ventricular size is severely enlarged. There is moderately elevated pulmonary artery systolic pressure. The estimated right ventricular  systolic pressure is 45.9 mmHg.  4. The mitral valve is degenerative. Trivial mitral valve regurgitation. Moderate mitral annular calcification.  5. Tricuspid valve regurgitation is mild to moderate.  6. The aortic valve is tricuspid. There is mild calcification of the aortic valve. Aortic valve regurgitation is not visualized.  7. The inferior vena cava is dilated in size with <50% respiratory variability, suggesting right atrial pressure of 15 mmHg. Comparison(s): Prior images reviewed side by side. LVEF is hyperdynamic at >75%. RV dysfunction persists with evidence of moderate pulmonary hypertension. FINDINGS  Left Ventricle: Left ventricular ejection fraction, by estimation, is >75%. The left ventricle has hyperdynamic function. The left ventricle has no regional wall motion abnormalities. The interventricular septum is flattened in systole and diastole, consistent with right ventricular pressure and volume overload. Right Ventricle: The right ventricular size is severely enlarged. No increase in right ventricular wall thickness. Right ventricular systolic function is moderately reduced. There is moderately elevated pulmonary artery systolic pressure. The tricuspid regurgitant velocity is 2.78 m/s, and with an assumed right atrial pressure of 15 mmHg, the estimated right ventricular systolic pressure is 45.9 mmHg. Pericardium: There is no evidence of pericardial effusion. Mitral Valve: The mitral valve is degenerative in appearance. There is mild thickening of the  mitral valve leaflet(s). There is mild calcification of the mitral valve leaflet(s). Moderate mitral annular calcification. Trivial mitral valve regurgitation. Tricuspid Valve: The tricuspid valve is grossly normal. Tricuspid valve regurgitation is mild to moderate. Aortic Valve: The aortic valve is tricuspid. There is mild calcification of the aortic valve. There is mild aortic valve annular calcification. Aortic valve regurgitation is not visualized. Pulmonic Valve: The pulmonic valve was not well visualized. Pulmonic valve regurgitation is trivial. Venous: The inferior vena cava is dilated in size with less than 50% respiratory variability, suggesting right atrial pressure of 15 mmHg. Additional Comments: Spectral Doppler performed. Color Doppler performed.  LEFT VENTRICLE PLAX 2D LVIDd:         3.80 cm LVIDs:         1.50 cm LV PW:         2.10 cm LV IVS:        0.70 cm  LEFT ATRIUM         Index LA diam:    3.50 cm 1.51 cm/m  TRICUSPID VALVE TR Peak grad:   30.9 mmHg TR Vmax:        278.00 cm/s Nona Dell MD Electronically signed by Nona Dell MD Signature Date/Time: 09/08/2022/2:18:57 PM    Final    DG CHEST PORT 1 VIEW  Result Date: 09/08/2022 CLINICAL DATA:  Dyspnea. EXAM: PORTABLE CHEST 1 VIEW COMPARISON:  02/24/2022 FINDINGS: The cardio pericardial silhouette is enlarged. There is pulmonary vascular congestion. Component of mild interstitial edema not excluded. No focal consolidation or substantial pleural effusion. IMPRESSION: Enlargement of the cardiopericardial silhouette with pulmonary vascular congestion and possible mild interstitial edema. 9 mm right upper lobe pulmonary nodule seen on chest CT 03/02/2022 not discernible on this chest x-ray. Electronically Signed   By: Kennith Center M.D.   On: 09/08/2022 05:11    Microbiology: Results for orders placed or performed during the hospital encounter of 08/01/19  SARS CORONAVIRUS 2 (TAT 6-24 HRS) Nasopharyngeal Nasopharyngeal Swab      Status: None   Collection Time: 08/01/19  7:21 AM   Specimen: Nasopharyngeal Swab  Result Value Ref Range Status   SARS Coronavirus 2 NEGATIVE NEGATIVE Final    Comment: (NOTE) SARS-CoV-2 target nucleic acids are  NOT DETECTED. The SARS-CoV-2 RNA is generally detectable in upper and lower respiratory specimens during the acute phase of infection. Negative results do not preclude SARS-CoV-2 infection, do not rule out co-infections with other pathogens, and should not be used as the sole basis for treatment or other patient management decisions. Negative results must be combined with clinical observations, patient history, and epidemiological information. The expected result is Negative. Fact Sheet for Patients: HairSlick.no Fact Sheet for Healthcare Providers: quierodirigir.com This test is not yet approved or cleared by the [place] FDA and  has been authorized for detection and/or diagnosis of SARS-CoV-2 by FDA under an Emergency Use Authorization (EUA). This EUA will remain  in effect (meaning this test can be used) for the duration of the COVID-19 declaration under Section 56 4(b)(1) of the Act, 21 U.S.C. section 360bbb-3(b)(1), unless the authorization is terminated or revoked sooner. Performed at [facility], [address] [place], [place] [address]     Labs: CBC: Recent Labs  Lab 09/07/22 1851 09/08/22 0302  WBC 5.5 6.2  NEUTROABS  --  4.8  HGB 11.5* 11.5*  HCT 37.0 37.1  MCV 103.1* 103.1*  PLT 223 195   Basic Metabolic Panel: Recent Labs  Lab 09/06/22 1611 09/07/22 1851 09/08/22 0302 09/09/22 0512  NA 139 136 134* 134*  K 3.9 3.4* 3.5 4.1  CL 100 97* 96* 98  CO2 27 26 27 28   GLUCOSE 63* 127* 170* 182*  BUN 65* 63* 61* 61*  CREATININE 2.04* 1.99* 1.77* 1.79*  CALCIUM 9.5 9.1 8.9 9.2  MG  --   --  2.0  --    Liver Function Tests: Recent Labs  Lab 09/06/22 1611 09/07/22 1851  09/08/22 0302  AST 26 26 23   ALT 15 15 14   ALKPHOS 53 55 58  BILITOT 1.1 1.1 1.2  PROT 7.2 7.4 6.9  ALBUMIN 3.9 3.8 3.8   CBG: Recent Labs  Lab 09/08/22 2121 09/08/22 2340 09/09/22 0325 09/09/22 0748 09/09/22 1109  GLUCAP 294* 278* 190* 189* 233*    Discharge time spent: greater than 30 minutes.  Signed: Vassie Loll, MD [facility] 09/09/2022

## 2022-09-12 ENCOUNTER — Telehealth (HOSPITAL_COMMUNITY): Payer: Self-pay | Admitting: Emergency Medicine

## 2022-09-12 NOTE — Telephone Encounter (Signed)
Attempted to call patient regarding upcoming cardiac MR appointment. Left message on voicemail with name and callback number Carynn Felling RN Navigator Cardiac Imaging [facility] [phone] Office [phone] Cell  

## 2022-09-13 ENCOUNTER — Other Ambulatory Visit (HOSPITAL_COMMUNITY): Payer: Self-pay | Admitting: Cardiology

## 2022-09-13 ENCOUNTER — Ambulatory Visit (HOSPITAL_COMMUNITY)
Admission: RE | Admit: 2022-09-13 | Discharge: 2022-09-13 | Disposition: A | Discharge status: Home / Self Care | Payer: Medicare Other | Source: Ambulatory Visit | Attending: Cardiology | Admitting: Cardiology

## 2022-09-13 DIAGNOSIS — I272 Pulmonary hypertension, unspecified: Secondary | ICD-10-CM

## 2022-09-13 MED ORDER — GADOBUTROL 1 MMOL/ML IV SOLN
10.0000 mL | Freq: Once | INTRAVENOUS | Status: AC | PRN
  Administered 2022-09-13: 10 mL via INTRAVENOUS

## 2022-09-18 ENCOUNTER — Telehealth (HOSPITAL_COMMUNITY): Payer: Self-pay

## 2022-09-18 ENCOUNTER — Ambulatory Visit: Payer: Medicare Other | Attending: Pulmonary Disease | Admitting: Pulmonary Disease

## 2022-09-18 DIAGNOSIS — G4736 Sleep related hypoventilation in conditions classified elsewhere: Secondary | ICD-10-CM | POA: Insufficient documentation

## 2022-09-18 DIAGNOSIS — G4733 Obstructive sleep apnea (adult) (pediatric): Secondary | ICD-10-CM | POA: Diagnosis not present

## 2022-09-18 DIAGNOSIS — G4761 Periodic limb movement disorder: Secondary | ICD-10-CM | POA: Diagnosis not present

## 2022-09-18 NOTE — Telephone Encounter (Signed)
She called complaining of slight weight gain, some shortness of breath noted, she has swelling in her abdomen. She states she cannot do anything because she gets short of breath. She states if she tries to do anything she has to go rest and lay down. Is there anything we can do until she is seen next week?

## 2022-09-18 NOTE — Telephone Encounter (Signed)
error 

## 2022-09-19 DIAGNOSIS — G4733 Obstructive sleep apnea (adult) (pediatric): Secondary | ICD-10-CM | POA: Diagnosis not present

## 2022-09-19 NOTE — Telephone Encounter (Signed)
I called patient to discuss information, she was at [facility] and will call me back. She thinks she has one Furoscix at home.

## 2022-09-19 NOTE — Telephone Encounter (Signed)
Tried to reach patient again. No answer.

## 2022-09-19 NOTE — Telephone Encounter (Signed)
Just following up on this message.  Thanks

## 2022-09-19 NOTE — Procedures (Signed)
Patient Name: Sandra Schneider, Sandra Schneider Date: 09/18/2022 Gender: Female D.O.B: 1955-07-31 Age (years): 62 Referring Provider: Cyril Mourning MD, ABSM Height (inches): 65 Interpreting Physician: Cyril Mourning MD, ABSM Weight (lbs): 287 RPSGT: Alfonso Ellis BMI: 48 MRN: [id number] Neck Size: 17.50 <br> <br> CLINICAL INFORMATION The patient is referred for a CPAP titration to treat sleep apnea.    Date of HST: 06/2022 severe  OSA with AHI 40/ hr and lowest: 50%!  SLEEP STUDY TECHNIQUE As per the AASM Manual for the Scoring of Sleep and Associated Events v2.3 (April 2016) with a hypopnea requiring 4% desaturations.  The channels recorded and monitored were frontal, central and occipital EEG, electrooculogram (EOG), submentalis EMG (chin), nasal and oral airflow, thoracic and abdominal wall motion, anterior tibialis EMG, snore microphone, electrocardiogram, and pulse oximetry. Continuous positive airway pressure (CPAP) was initiated at the beginning of the study and titrated to treat sleep-disordered breathing.  MEDICATIONS Medications self-administered by patient taken the night of the study : FUROSEMIDE, isosorbide hydralazine, METFORMIN, METOPROLOL TARTRATE  TECHNICIAN COMMENTS Comments added by technician: Patient had difficulty initiating sleep. Patient C/O leg pain and movement; Patient walked around, after lights out time. She stated her legs was restless. Patient tolerated CPAP pressure fairly well. CPAP therapy started at 4 CWP, adding heated humidification. Titration incresed to 15 CWP in REM stage; Patient awakened out of REM stage, stating pressure was too high. Tech. then applied an EPR of 3. Optimal pressure of 15 CWP in lateral positions but supine positon was not observed (patient stated she couldn't sleep on her back). ECG = PVC, VT; please note PRINT OUT of page 529. ALTHOUGH, an EPR of 3 used during final CPAP pressure of 15 CWP, the EPR was not used at final pressure in REM  stages, mainly due to obvious events. O2 supplement of 2 lpm added to CPAP therapy due to the sleep labs' protocol Comments added by scorer: N/A RESPIRATORY PARAMETERS Optimal PAP Pressure (cm): 15 AHI at Optimal Pressure (/hr): 1.9 Overall Minimal O2 (%): 73.00 Supine % at Optimal Pressure (%): 0 Minimal O2 at Optimal Pressure (%): 84.0   SLEEP ARCHITECTURE The study was initiated at 10:55:37 PM and ended at 5:37:39 AM.  Sleep onset time was 96.8 minutes and the sleep efficiency was 61.9%. The total sleep time was 249 minutes.  The patient spent 5.82% of the night in stage N1 sleep, 41.97% in stage N2 sleep, 9.44% in stage N3 and 42.8% in REM.Stage REM latency was 74.5 minutes  Wake after sleep onset was 56.3. Alpha intrusion was absent. Supine sleep was 0.00%.  CARDIAC DATA The 2 lead EKG demonstrated sinus rhythm. The mean heart rate was 74.74 beats per minute. Other EKG findings include: Ventricular Tachycardia, PVCs.   LEG MOVEMENT DATA The total Periodic Limb Movements of Sleep (PLMS) were 364. The PLMS index was 87.71. A PLMS index of <15 is considered normal in adults.  IMPRESSIONS - The optimal PAP pressure was 15 cm of water. - Central sleep apnea was not noted during this titration (CAI = 0.2/h). - Severe oxygen desaturations were observed during this titration (min O2 = 73.00%).2 L O2 was added due to persistent desaturations on final CPAP level - The patient snored with moderate snoring volume during this titration study. - 2-lead EKG demonstrated: Ventricular Tachycardia, PVCs - Severe periodic limb movements were observed during this study. Arousals associated with PLMs were significant.   DIAGNOSIS - Obstructive Sleep Apnea (G47.33) - Nocturnal hypoxia - Periodic Limb Movement During Sleep (  G47.61)   RECOMMENDATIONS - Trial of CPAP therapy on 15 cm H2O with a Small size Fisher&Paykel Full Face Simplus mask and heated humidification. - 2L O2 was blended into  CPAP - Avoid alcohol, sedatives and other CNS depressants that may worsen sleep apnea and disrupt normal sleep architecture. - Sleep hygiene should be reviewed to assess factors that may improve sleep quality. - Weight management and regular exercise should be initiated or continued. - Return to Sleep Center for re-evaluation after 4 weeks of therapy   Cyril Mourning MD Board Certified in Sleep medicine

## 2022-09-19 NOTE — Telephone Encounter (Signed)
done

## 2022-09-20 ENCOUNTER — Ambulatory Visit: Payer: Medicare Other | Admitting: Internal Medicine

## 2022-09-20 ENCOUNTER — Encounter: Payer: Self-pay | Admitting: Internal Medicine

## 2022-09-20 VITALS — BP 110/66 | HR 69 | Ht 65.0 in | Wt 284.0 lb

## 2022-09-20 DIAGNOSIS — Z713 Dietary counseling and surveillance: Secondary | ICD-10-CM | POA: Diagnosis not present

## 2022-09-20 DIAGNOSIS — R0602 Shortness of breath: Secondary | ICD-10-CM | POA: Diagnosis not present

## 2022-09-20 DIAGNOSIS — I272 Pulmonary hypertension, unspecified: Secondary | ICD-10-CM

## 2022-09-20 DIAGNOSIS — Z7182 Exercise counseling: Secondary | ICD-10-CM | POA: Diagnosis not present

## 2022-09-20 DIAGNOSIS — G4733 Obstructive sleep apnea (adult) (pediatric): Secondary | ICD-10-CM | POA: Diagnosis not present

## 2022-09-20 DIAGNOSIS — R911 Solitary pulmonary nodule: Secondary | ICD-10-CM | POA: Diagnosis not present

## 2022-09-20 DIAGNOSIS — R0609 Other forms of dyspnea: Secondary | ICD-10-CM

## 2022-09-20 DIAGNOSIS — E119 Type 2 diabetes mellitus without complications: Secondary | ICD-10-CM | POA: Diagnosis not present

## 2022-09-20 NOTE — Assessment & Plan Note (Signed)
Onset 12/2021 "worse than usual" DOE - restrictive physiology with ERV 8% @ 275 lbs by pfts 03/2022  - new dx resting hypoxemia 09/20/2022 see sep a/p   She is not hypercarbic but clearly very restricted by abd girth on exam ? Element of ascites also?   Needs full w/u to include chest CT (due anyway for SPN)  which should help with identifying ? Ascites

## 2022-09-20 NOTE — Assessment & Plan Note (Signed)
Never smoker - CT 03/03/23  RUL  0.9 cm - PET  [id number]  neg > rec f/u CT 6 m ordered 09/20/2022          Each maintenance medication was reviewed in detail including emphasizing most importantly the difference between maintenance and prns and under what circumstances the prns are to be triggered using an action plan format where appropriate.  Total time for H and P, chart review, counseling, reviewing 02  device(s) and generating customized AVS unique to this office visit / same day charting > 30 min for  refractory respiratory  symptoms of uncertain etiology

## 2022-09-20 NOTE — Progress Notes (Signed)
Sandra Schneider Sandra Schneider, female    DOB: 09/24/1955    MRN: [id number]   Brief patient profile:  3  yobf  never smoker  self referred to pulmonary clinic in [facility]  09/20/2022 s/p admit for hypoglycemia "and they changed one of my pills"     PMH -hypertension Diabetes type 2 CAD status post DES to LAD and left circumflex Breast cancer 2021 s/p lumpectomy and radiation, now on Femara   Significant tests/ events reviewed   01/2022 echo normal LVEF, enlarged RV, PASP 80 01/2022 LHC/RHC-CI 3.1, RA 32, PA 107/48, mean PA 69, PCWP?  Unable to estimate, elevated LVEDP   VBG 7.2 9/52/41   PFTs 03/2022 moderate restriction, ratio 84, FEV1 65%, FVC 59%, TLC 74%, DLCO 13.6/64%.  ERV 8% with wt 275    02/2022 VQ scan negative. CT chest without con 02/2022 right upper lobe nodule, mild right paratracheal right subcarinal lymphadenopathy, no evidence of ILD   PET scan 03/2022 no hypermetabolism in lung nodule or lymphadenopathy, possible rectal mass, breast mass mildly hypermetabolic SUV 2.8  Admit date:     09/07/2022  Discharge date: 09/09/22   Discharge Diagnoses: Principal Problem:   Hypoglycemia Active Problems:   Atherosclerotic heart disease of native coronary artery without angina pectoris   Essential (primary) hypertension   Hyperlipidemia   Type 2 diabetes mellitus with hyperglycemia (HCC)   Acute on chronic diastolic CHF (congestive heart failure) (HCC)   Chronic kidney disease   Brief Hospital admission course: As per H&P written by Dr. Dorthula Perfect- Camillo Flaming on 09/08/22 Sandra Schneider is a 67 y.o. female with medical history significant of breast cancer, coronary artery disease, diabetes mellitus type 2, GERD, hypertension relatively newly diagnosed diastolic CHF, and more presents ED with a chief complaint of hypoglycemia.  Patient reports she was at a routine doctor's appointment where they did labs.  She was called and notified that her glucose was low.  At that time was 29.   They had checked her glucose and it was 55.  She drank some orange juice and checked it again it was 66.  30 minutes later was back down to 57.  Patient reports that normally she only checks her glucose every 2 or 3 days.  It has been running 71-101 at baseline per her report.  She reports that it does not get down to 71 that often.  Again patient is asymptomatic.  She was advised to come into the ER.  She does report that she took her glipizide on the 25th.  Regarding her glucose, patient has no other complaints at this time.   Patient does report that she has been doing diuretic adjustments secondary to her creatinine fluctuations in the outpatient setting.  She reports that when she was first diagnosed with CHF she gained 20 pounds in 2 weeks.  She feels like she is fluid overloaded again.  Her legs that do not have that much fluid in them but she feels like it is on her belly.  She does appear to have somewhat distended belly.  Patient reports dyspnea on exertion, orthopnea, and fluid retention.  She denies any chest pain, palpitations.  Patient does not wear CPAP at home, but likely needs to.  She reports she has a sleep study coming up.  Patient has no other complaints at this time.   Patient does not smoke, does not drink, does not use illicit drugs.  She is vaccinated for COVID and flu.    Assessment and  Plan: * Hypoglycemia - Patient had asymptomatic hypoglycemia -Patient's A1c 5.7; not requiring aggressive hypoglycemic regimen at the moment. -Patient advised to maintain adequate hydration and to follow modified carbohydrate diet -Glipizide has been discontinue and patient asked to continue just using metformin. -Continue monitoring CBGs closely, follow fluctuation and repeat A1c as an outpatient to further determine hypoglycemic regimen.   Chronic kidney disease -Stable and at baseline at time of discharge -Patient with chronic kidney disease stage IIIb at baseline. -Continue to closely  follow electrolytes and renal function trend. -Heart healthy diet and adequate hydration discussed with patient.   Acute on chronic diastolic CHF (congestive heart failure) (HCC) - Patient reports dyspnea on exertion, orthopnea, fluid retention. -After curbside and cardiology service medication regimen has been adjusted and patient discharged home on Lasix 60 mg twice a day. -She will continue close follow-up with cardiology service/heart failure team to further adjust her medications. -Daily weights, adequate hydration and low-sodium diet discussed with patient.     Type 2 diabetes mellitus with hyperglycemia (HCC) - A1c 5.7 -Glipizide discontinued at discharge as mentioned above -Continue the use of metformin and adjusted dose as part of hypoglycemic regimen. -Patient advised to follow modified carbohydrate diet and maintain adequate hydration.   Hyperlipidemia - Continue statin -Heart healthy diet discussed with patient.   Essential (primary) hypertension - Stable and well-controlled with current antihypertensive agents -Heart healthy diet discussed with patient. -Continue to follow vital signs.   Atherosclerotic heart disease of native coronary artery without angina pectoris - Continue aspirin, statin, beta-blocker - Patient denies chest pain, palpitations, shortness of breath and diaphoresis at discharge. -Continue patient follow-up with cardiology service.     Class 3 obesity -Body mass index is 48.87 kg/m. -Low-calorie diet, portion control and increase physical activity discussed with patient.   Consultants: Cardiology service curbside. Procedures performed: See below for x-ray reports. Disposition: Home Diet recommendation: Heart healthy/low calorie diet and modified carbohydrates.     DISCHARGE MEDICATION: Allergies as of 09/09/2022   No Known Allergies         Medication List       STOP taking these medications     amLODipine 10 MG tablet Commonly known  as: NORVASC    chlorthalidone 25 MG tablet Commonly known as: HYGROTON    glipiZIDE 10 MG tablet Commonly known as: GLUCOTROL    lisinopril 40 MG tablet Commonly known as: ZESTRIL    metolazone 2.5 MG tablet Commonly known as: ZAROXOLYN           History of Present Illness  09/20/2022  Pulmonary/ 1st office eval/ Saulo Anthis / [place] [facility]  Chief Complaint  Patient presents with   Acute Visit    Pt acute visit, 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 Alfonzo Beers. Her O2 sats are currently between 87-89% on room air   Dyspnea:  onset 12/2021, still comfortable sitting still but walks only 15- 20 ft then gives out / rides scooter at grocery store x 5 months Cough: daily dry onset was prior to breathing getting worse and was on acei until last admit/ no better off it yet. Sleep: flat bed / 2 pillows / no 02 or cpap yet  SABA use: no inhalers  02: none   No obvious day to day or daytime pattern/variability or assoc excess/ purulent sputum or mucus plugs or hemoptysis or cp or chest tightness, subjective wheeze or overt sinus or hb symptoms.  Sleeping  without nocturnal  or early am exacerbation  of respiratory  c/o's or need for noct saba. Also denies any obvious fluctuation of symptoms with weather or environmental changes or other aggravating or alleviating factors except as outlined above   No unusual exposure hx or h/o childhood pna/ asthma or knowledge of premature birth.  Current Allergies, Complete Past Medical History, Past Surgical History, Family History, and Social History were reviewed in [employer] record.  ROS  The following are not active complaints unless bolded Hoarseness, sore throat, dysphagia, dental problems, itching, sneezing,  nasal congestion or discharge of excess mucus or purulent secretions, ear ache,   fever, chills, sweats, unintended wt loss or wt gain, classically pleuritic or exertional cp,   orthopnea pnd or arm/hand swelling  or leg swelling, presyncope, palpitations, abdominal pain, anorexia, nausea, vomiting, diarrhea  or change in bowel habits or change in bladder habits, change in stools or change in urine, dysuria, hematuria,  rash, arthralgias, visual complaints, headache, numbness, weakness or ataxia or problems with walking or coordination,  change in mood or  memory.                Past Medical History:  Diagnosis Date   Breast cancer (HCC)    Coronary artery disease    Diabetes mellitus    GERD (gastroesophageal reflux disease)    Hypertension    Myocardial abscess    Myocardial infarct (HCC) 06/24/2012   Pulmonary hypertension (HCC)     Outpatient Medications Prior to Visit  Medication Sig Dispense Refill   Ascorbic Acid (VITAMIN C PO) Take 1 tablet by mouth daily.     aspirin EC 81 MG tablet Take 81 mg by mouth daily.     atorvastatin (LIPITOR) 40 MG tablet Take 1 tablet by mouth once daily 90 tablet 0   Cholecalciferol (VITAMIN D) 50 MCG (2000 UT) CAPS Take 2,000 Units by mouth daily.     dorzolamide-timolol (COSOPT) 22.3-6.8 MG/ML ophthalmic solution Place 1 drop into both eyes 2 (two) times daily.     famotidine (PEPCID) 20 MG tablet Take 20 mg by mouth daily.     furosemide (LASIX) 20 MG tablet Take 3 tablets (60 mg total) by mouth 2 (two) times daily. 90 tablet 2   isosorbide-hydrALAZINE (BIDIL) 20-37.5 MG tablet Take 1 tablet by mouth 2 (two) times daily. 60 tablet 2   letrozole (FEMARA) 2.5 MG tablet Take 2.5 mg by mouth daily.     loratadine (CLARITIN) 10 MG tablet Take 10 mg by mouth daily.     metFORMIN (GLUCOPHAGE) 1000 MG tablet Take 0.5 tablets (500 mg total) by mouth 2 (two) times daily with a meal.     metoprolol tartrate (LOPRESSOR) 50 MG tablet Take 50 mg by mouth 2 (two) times daily.     Multiple Vitamin (MULTIVITAMIN) tablet Take 1 tablet by mouth daily.     potassium chloride (KLOR-CON M) 10 MEQ tablet Take 2 tablets (20 mEq total) by  mouth daily. 60 tablet 11   sildenafil (REVATIO) 20 MG tablet Take 1 tablet (20 mg total) by mouth 3 (three) times daily. 90 tablet 3   Facility-Administered Medications Prior to Visit  Medication Dose Route Frequency Provider Last Rate Last Admin   sodium chloride flush (NS) 0.9 % injection 3 mL  3 mL Intravenous Q12H Antoine Poche, MD         Objective:     BP 110/66   Pulse 69   Ht  5\' 5"  (1.651 m)   Wt 284 lb (128.8 kg)   LMP 01/14/2011   SpO2 (!) 88% Comment: RA @ rest  BMI 47.26 kg/m   SpO2: (!) 88 % (RA @ rest)  Wt Readings from Last 3 Encounters:  09/20/22 284 lb (128.8 kg)  09/09/22 293 lb 10.4 oz (133.2 kg)  09/06/22 291 lb 9.6 oz (132.3 kg)   Vital signs reviewed  09/20/2022  - Note at rest 02 sats  88% on RA   General appearance:    Morbidly obese (by BMI) amb bf / massive abd  Easily confused with details of care      HEENT : Oropharynx  clear          NECK :  without  apparent JVD/ palpable Nodes/TM    LUNGS: no acc muscle use,  Nl contour chest which is clear to A and P bilaterally without cough on insp or exp maneuvers   CV:  RRR  no s3 or murmur or increase in P2, and no edema   ABD: tensely obese but nontender     MS:  slow gait, unable to get up on exam table/ ext warm without deformities Or obvious joint restrictions  calf tenderness, cyanosis or clubbing    SKIN: warm and dry without lesions    NEURO:  alert, approp, nl sensorium with  no motor or cerebellar deficits apparent.       I personally reviewed images and agree with radiology impression as follows:  CXR:   portable 09/08/22  Enlargement of the cardiopericardial silhouette with pulmonary vascular congestion and possible mild interstitial edema.  Assessment   No problem-specific Assessment & Plan notes found for this encounter.     Sandrea Hughs, MD 09/20/2022

## 2022-09-20 NOTE — Telephone Encounter (Signed)
Left message for her to call back to go over medication changes. Furoscix has been ordered for her as well.

## 2022-09-20 NOTE — Assessment & Plan Note (Signed)
09/18/22  DIAGNOSIS - Obstructive Sleep Apnea (G47.33) - Nocturnal hypoxia - Periodic Limb Movement During Sleep (G47.61)     RECOMMENDATIONS as of Sep 17 2012  - Trial of CPAP therapy on 15 cm H2O with a Small size [employer] Full Face Simplus mask and heated humidification. - 2L O2 was blended into CPAP - Avoid alcohol, sedatives and other CNS depressants that may worsen sleep apnea and disrupt normal sleep architecture. - Sleep hygiene should be reviewed to assess factors that may improve sleep quality. - Weight management and regular exercise should be initiated or continued. - [facility] to [facility] for [facility]-evaluation after 4 weeks of therapy  >>> try to expedite rx to ASAP

## 2022-09-20 NOTE — Patient Instructions (Addendum)
We will try to get you portable oxygen today > ideally adjust to keep your level above 90% by pulse oximetry  We will try to get all your equipment for cpap/night time 02 to your house today   Please remember to go to the lab department   for your tests - we will call you with the results when they are available.      Keep up appts with Dr Vassie Loll, to ER if  breathing getting worse at rest and weigh yourself daily in the same clothes

## 2022-09-20 NOTE — Telephone Encounter (Signed)
I spoke to patient and updated her. She will start the Furoscix today because she has one at home. She verbalized understanding of medication information as well.

## 2022-09-21 ENCOUNTER — Other Ambulatory Visit: Payer: Self-pay

## 2022-09-21 DIAGNOSIS — H40053 Ocular hypertension, bilateral: Secondary | ICD-10-CM | POA: Diagnosis not present

## 2022-09-21 DIAGNOSIS — E113311 Type 2 diabetes mellitus with moderate nonproliferative diabetic retinopathy with macular edema, right eye: Secondary | ICD-10-CM | POA: Diagnosis not present

## 2022-09-21 DIAGNOSIS — E113392 Type 2 diabetes mellitus with moderate nonproliferative diabetic retinopathy without macular edema, left eye: Secondary | ICD-10-CM | POA: Diagnosis not present

## 2022-09-21 LAB — BASIC METABOLIC PANEL
BUN/Creatinine Ratio: 25 (ref 12–28)
BUN: 55 mg/dL — ABNORMAL HIGH (ref 8–27)
CO2: 23 mmol/L (ref 20–29)
Calcium: 9.8 mg/dL (ref 8.7–10.3)
Chloride: 95 mmol/L — ABNORMAL LOW (ref 96–106)
Creatinine, Ser: 2.16 mg/dL — ABNORMAL HIGH (ref 0.57–1.00)
Glucose: 180 mg/dL — ABNORMAL HIGH (ref 70–99)
Potassium: 4.2 mmol/L (ref 3.5–5.2)
Sodium: 139 mmol/L (ref 134–144)
eGFR: 24 mL/min/{1.73_m2} — ABNORMAL LOW (ref >59)

## 2022-09-21 LAB — CBC WITH DIFFERENTIAL/PLATELET
Basophils Absolute: 0 10*3/uL (ref 0.0–0.2)
Basos: 0 %
EOS (ABSOLUTE): 0 10*3/uL (ref 0.0–0.4)
Eos: 0 %
Hematocrit: 38 % (ref 34.0–46.6)
Hemoglobin: 12.4 g/dL (ref 11.1–15.9)
Immature Grans (Abs): 0 10*3/uL (ref 0.0–0.1)
Immature Granulocytes: 0 %
Lymphocytes Absolute: 0.8 10*3/uL (ref 0.7–3.1)
Lymphs: 12 %
MCH: 31.3 pg (ref 26.6–33.0)
MCHC: 32.6 g/dL (ref 31.5–35.7)
MCV: 96 fL (ref 79–97)
Monocytes Absolute: 0.6 10*3/uL (ref 0.1–0.9)
Monocytes: 8 %
Neutrophils Absolute: 5.5 10*3/uL (ref 1.4–7.0)
Neutrophils: 80 %
Platelets: 213 10*3/uL (ref 150–450)
RBC: 3.96 x10E6/uL (ref 3.77–5.28)
RDW: 13.9 % (ref 11.7–15.4)
WBC: 6.9 10*3/uL (ref 3.4–10.8)

## 2022-09-21 LAB — D-DIMER, QUANTITATIVE: D-DIMER: 1.96 mg/L FEU — ABNORMAL HIGH (ref 0.00–0.49)

## 2022-09-21 LAB — BRAIN NATRIURETIC PEPTIDE: BNP: 2885.7 pg/mL — ABNORMAL HIGH (ref 0.0–100.0)

## 2022-09-21 LAB — TSH: TSH: 1.59 u[IU]/mL (ref 0.450–4.500)

## 2022-09-22 ENCOUNTER — Other Ambulatory Visit: Payer: Self-pay

## 2022-09-22 DIAGNOSIS — R0609 Other forms of dyspnea: Secondary | ICD-10-CM

## 2022-09-22 DIAGNOSIS — G4733 Obstructive sleep apnea (adult) (pediatric): Secondary | ICD-10-CM

## 2022-09-22 NOTE — Addendum Note (Signed)
Addended by: Lajoyce Lauber A on: 09/22/2022 08:58 AM   Modules accepted: Orders

## 2022-09-25 ENCOUNTER — Ambulatory Visit (HOSPITAL_COMMUNITY)
Admission: RE | Admit: 2022-09-25 | Discharge: 2022-09-25 | Disposition: A | Discharge status: Home / Self Care | Payer: Medicare Other | Source: Ambulatory Visit | Attending: Internal Medicine | Admitting: Internal Medicine

## 2022-09-25 DIAGNOSIS — I483 Typical atrial flutter: Secondary | ICD-10-CM | POA: Diagnosis not present

## 2022-09-25 DIAGNOSIS — I2510 Atherosclerotic heart disease of native coronary artery without angina pectoris: Secondary | ICD-10-CM | POA: Diagnosis not present

## 2022-09-25 DIAGNOSIS — I252 Old myocardial infarction: Secondary | ICD-10-CM | POA: Diagnosis not present

## 2022-09-25 DIAGNOSIS — I10 Essential (primary) hypertension: Secondary | ICD-10-CM | POA: Diagnosis not present

## 2022-09-25 DIAGNOSIS — R0609 Other forms of dyspnea: Secondary | ICD-10-CM

## 2022-09-25 DIAGNOSIS — R911 Solitary pulmonary nodule: Secondary | ICD-10-CM | POA: Diagnosis not present

## 2022-09-25 DIAGNOSIS — Z170 Estrogen receptor positive status [ER+]: Secondary | ICD-10-CM | POA: Diagnosis not present

## 2022-09-25 DIAGNOSIS — Z79899 Other long term (current) drug therapy: Secondary | ICD-10-CM | POA: Diagnosis not present

## 2022-09-25 DIAGNOSIS — I509 Heart failure, unspecified: Secondary | ICD-10-CM | POA: Diagnosis not present

## 2022-09-25 DIAGNOSIS — E871 Hypo-osmolality and hyponatremia: Secondary | ICD-10-CM | POA: Diagnosis not present

## 2022-09-25 DIAGNOSIS — N189 Chronic kidney disease, unspecified: Secondary | ICD-10-CM | POA: Diagnosis not present

## 2022-09-25 DIAGNOSIS — E785 Hyperlipidemia, unspecified: Secondary | ICD-10-CM | POA: Diagnosis not present

## 2022-09-25 DIAGNOSIS — D631 Anemia in chronic kidney disease: Secondary | ICD-10-CM | POA: Diagnosis not present

## 2022-09-25 DIAGNOSIS — I272 Pulmonary hypertension, unspecified: Secondary | ICD-10-CM | POA: Diagnosis not present

## 2022-09-25 DIAGNOSIS — I132 Hypertensive heart and chronic kidney disease with heart failure and with stage 5 chronic kidney disease, or end stage renal disease: Secondary | ICD-10-CM | POA: Diagnosis not present

## 2022-09-25 DIAGNOSIS — I081 Rheumatic disorders of both mitral and tricuspid valves: Secondary | ICD-10-CM | POA: Diagnosis not present

## 2022-09-25 DIAGNOSIS — Z794 Long term (current) use of insulin: Secondary | ICD-10-CM | POA: Diagnosis not present

## 2022-09-25 DIAGNOSIS — I4892 Unspecified atrial flutter: Secondary | ICD-10-CM | POA: Diagnosis not present

## 2022-09-25 DIAGNOSIS — E1122 Type 2 diabetes mellitus with diabetic chronic kidney disease: Secondary | ICD-10-CM | POA: Diagnosis not present

## 2022-09-25 DIAGNOSIS — G2581 Restless legs syndrome: Secondary | ICD-10-CM | POA: Diagnosis not present

## 2022-09-25 DIAGNOSIS — R079 Chest pain, unspecified: Secondary | ICD-10-CM | POA: Diagnosis not present

## 2022-09-25 DIAGNOSIS — I130 Hypertensive heart and chronic kidney disease with heart failure and stage 1 through stage 4 chronic kidney disease, or unspecified chronic kidney disease: Secondary | ICD-10-CM | POA: Diagnosis not present

## 2022-09-25 DIAGNOSIS — E1165 Type 2 diabetes mellitus with hyperglycemia: Secondary | ICD-10-CM | POA: Diagnosis not present

## 2022-09-25 DIAGNOSIS — W07XXXA Fall from chair, initial encounter: Secondary | ICD-10-CM | POA: Diagnosis not present

## 2022-09-25 DIAGNOSIS — C50411 Malignant neoplasm of upper-outer quadrant of right female breast: Secondary | ICD-10-CM | POA: Diagnosis not present

## 2022-09-25 DIAGNOSIS — Z6841 Body Mass Index (BMI) 40.0 and over, adult: Secondary | ICD-10-CM | POA: Diagnosis not present

## 2022-09-25 DIAGNOSIS — Z79811 Long term (current) use of aromatase inhibitors: Secondary | ICD-10-CM | POA: Diagnosis not present

## 2022-09-25 DIAGNOSIS — I071 Rheumatic tricuspid insufficiency: Secondary | ICD-10-CM | POA: Diagnosis not present

## 2022-09-25 DIAGNOSIS — I5033 Acute on chronic diastolic (congestive) heart failure: Secondary | ICD-10-CM | POA: Diagnosis not present

## 2022-09-25 DIAGNOSIS — E662 Morbid (severe) obesity with alveolar hypoventilation: Secondary | ICD-10-CM | POA: Diagnosis present

## 2022-09-25 DIAGNOSIS — I959 Hypotension, unspecified: Secondary | ICD-10-CM | POA: Diagnosis not present

## 2022-09-25 DIAGNOSIS — E876 Hypokalemia: Secondary | ICD-10-CM | POA: Diagnosis not present

## 2022-09-25 DIAGNOSIS — I4819 Other persistent atrial fibrillation: Secondary | ICD-10-CM | POA: Diagnosis not present

## 2022-09-25 DIAGNOSIS — I5082 Biventricular heart failure: Secondary | ICD-10-CM | POA: Diagnosis not present

## 2022-09-25 DIAGNOSIS — H409 Unspecified glaucoma: Secondary | ICD-10-CM | POA: Diagnosis not present

## 2022-09-25 DIAGNOSIS — Y92239 Unspecified place in hospital as the place of occurrence of the external cause: Secondary | ICD-10-CM | POA: Diagnosis not present

## 2022-09-25 DIAGNOSIS — N184 Chronic kidney disease, stage 4 (severe): Secondary | ICD-10-CM | POA: Diagnosis not present

## 2022-09-25 DIAGNOSIS — R0602 Shortness of breath: Secondary | ICD-10-CM | POA: Diagnosis not present

## 2022-09-25 DIAGNOSIS — I50810 Right heart failure, unspecified: Secondary | ICD-10-CM | POA: Diagnosis not present

## 2022-09-25 DIAGNOSIS — Z7901 Long term (current) use of anticoagulants: Secondary | ICD-10-CM | POA: Diagnosis not present

## 2022-09-25 DIAGNOSIS — I4891 Unspecified atrial fibrillation: Secondary | ICD-10-CM | POA: Diagnosis not present

## 2022-09-25 DIAGNOSIS — N179 Acute kidney failure, unspecified: Secondary | ICD-10-CM | POA: Diagnosis not present

## 2022-09-25 DIAGNOSIS — I2721 Secondary pulmonary arterial hypertension: Secondary | ICD-10-CM | POA: Diagnosis not present

## 2022-09-25 DIAGNOSIS — I361 Nonrheumatic tricuspid (valve) insufficiency: Secondary | ICD-10-CM | POA: Diagnosis not present

## 2022-09-25 DIAGNOSIS — Z955 Presence of coronary angioplasty implant and graft: Secondary | ICD-10-CM | POA: Diagnosis not present

## 2022-09-25 DIAGNOSIS — J9811 Atelectasis: Secondary | ICD-10-CM | POA: Diagnosis not present

## 2022-09-26 ENCOUNTER — Emergency Department (HOSPITAL_COMMUNITY): Payer: Medicare Other

## 2022-09-26 ENCOUNTER — Other Ambulatory Visit (HOSPITAL_COMMUNITY): Payer: Self-pay

## 2022-09-26 ENCOUNTER — Encounter (HOSPITAL_COMMUNITY): Payer: Self-pay | Admitting: Internal Medicine

## 2022-09-26 ENCOUNTER — Other Ambulatory Visit: Payer: Self-pay

## 2022-09-26 ENCOUNTER — Telehealth (HOSPITAL_COMMUNITY): Payer: Self-pay | Admitting: Pharmacy Technician

## 2022-09-26 ENCOUNTER — Ambulatory Visit (HOSPITAL_BASED_OUTPATIENT_CLINIC_OR_DEPARTMENT_OTHER)
Admission: RE | Admit: 2022-09-26 | Discharge: 2022-09-26 | Disposition: A | Discharge status: Home / Self Care | Payer: Medicare Other | Source: Ambulatory Visit | Attending: Cardiology | Admitting: Cardiology

## 2022-09-26 ENCOUNTER — Encounter (HOSPITAL_COMMUNITY): Payer: Self-pay | Admitting: Cardiology

## 2022-09-26 ENCOUNTER — Inpatient Hospital Stay (HOSPITAL_COMMUNITY)
Admission: EM | Admit: 2022-09-26 | Discharge: 2022-10-07 | DRG: 286 | Disposition: A | Discharge status: Home Health | Payer: Medicare Other | Attending: Internal Medicine | Admitting: Internal Medicine

## 2022-09-26 VITALS — BP 130/80 | HR 88 | Wt 288.0 lb

## 2022-09-26 DIAGNOSIS — I4892 Unspecified atrial flutter: Secondary | ICD-10-CM | POA: Diagnosis present

## 2022-09-26 DIAGNOSIS — G4733 Obstructive sleep apnea (adult) (pediatric): Secondary | ICD-10-CM | POA: Insufficient documentation

## 2022-09-26 DIAGNOSIS — I959 Hypotension, unspecified: Secondary | ICD-10-CM | POA: Diagnosis not present

## 2022-09-26 DIAGNOSIS — I50810 Right heart failure, unspecified: Secondary | ICD-10-CM

## 2022-09-26 DIAGNOSIS — Z6841 Body Mass Index (BMI) 40.0 and over, adult: Secondary | ICD-10-CM | POA: Insufficient documentation

## 2022-09-26 DIAGNOSIS — N184 Chronic kidney disease, stage 4 (severe): Secondary | ICD-10-CM | POA: Diagnosis present

## 2022-09-26 DIAGNOSIS — I071 Rheumatic tricuspid insufficiency: Secondary | ICD-10-CM | POA: Diagnosis present

## 2022-09-26 DIAGNOSIS — Z79899 Other long term (current) drug therapy: Secondary | ICD-10-CM | POA: Diagnosis not present

## 2022-09-26 DIAGNOSIS — I5033 Acute on chronic diastolic (congestive) heart failure: Secondary | ICD-10-CM | POA: Diagnosis present

## 2022-09-26 DIAGNOSIS — E871 Hypo-osmolality and hyponatremia: Secondary | ICD-10-CM | POA: Diagnosis present

## 2022-09-26 DIAGNOSIS — R911 Solitary pulmonary nodule: Secondary | ICD-10-CM | POA: Diagnosis present

## 2022-09-26 DIAGNOSIS — Z7982 Long term (current) use of aspirin: Secondary | ICD-10-CM

## 2022-09-26 DIAGNOSIS — I5082 Biventricular heart failure: Secondary | ICD-10-CM | POA: Diagnosis present

## 2022-09-26 DIAGNOSIS — Y92239 Unspecified place in hospital as the place of occurrence of the external cause: Secondary | ICD-10-CM | POA: Diagnosis not present

## 2022-09-26 DIAGNOSIS — R600 Localized edema: Secondary | ICD-10-CM | POA: Insufficient documentation

## 2022-09-26 DIAGNOSIS — I2510 Atherosclerotic heart disease of native coronary artery without angina pectoris: Secondary | ICD-10-CM | POA: Diagnosis not present

## 2022-09-26 DIAGNOSIS — I10 Essential (primary) hypertension: Secondary | ICD-10-CM | POA: Diagnosis not present

## 2022-09-26 DIAGNOSIS — H409 Unspecified glaucoma: Secondary | ICD-10-CM | POA: Insufficient documentation

## 2022-09-26 DIAGNOSIS — Z955 Presence of coronary angioplasty implant and graft: Secondary | ICD-10-CM | POA: Insufficient documentation

## 2022-09-26 DIAGNOSIS — I483 Typical atrial flutter: Secondary | ICD-10-CM | POA: Diagnosis present

## 2022-09-26 DIAGNOSIS — I272 Pulmonary hypertension, unspecified: Secondary | ICD-10-CM | POA: Insufficient documentation

## 2022-09-26 DIAGNOSIS — I2721 Secondary pulmonary arterial hypertension: Secondary | ICD-10-CM | POA: Diagnosis present

## 2022-09-26 DIAGNOSIS — Z170 Estrogen receptor positive status [ER+]: Secondary | ICD-10-CM | POA: Diagnosis not present

## 2022-09-26 DIAGNOSIS — R0602 Shortness of breath: Secondary | ICD-10-CM | POA: Diagnosis not present

## 2022-09-26 DIAGNOSIS — N179 Acute kidney failure, unspecified: Secondary | ICD-10-CM | POA: Diagnosis not present

## 2022-09-26 DIAGNOSIS — E1122 Type 2 diabetes mellitus with diabetic chronic kidney disease: Secondary | ICD-10-CM | POA: Diagnosis present

## 2022-09-26 DIAGNOSIS — I252 Old myocardial infarction: Secondary | ICD-10-CM

## 2022-09-26 DIAGNOSIS — C50411 Malignant neoplasm of upper-outer quadrant of right female breast: Secondary | ICD-10-CM | POA: Diagnosis present

## 2022-09-26 DIAGNOSIS — E785 Hyperlipidemia, unspecified: Secondary | ICD-10-CM | POA: Diagnosis present

## 2022-09-26 DIAGNOSIS — J9811 Atelectasis: Secondary | ICD-10-CM | POA: Diagnosis not present

## 2022-09-26 DIAGNOSIS — N189 Chronic kidney disease, unspecified: Secondary | ICD-10-CM | POA: Diagnosis not present

## 2022-09-26 DIAGNOSIS — E876 Hypokalemia: Secondary | ICD-10-CM | POA: Diagnosis not present

## 2022-09-26 DIAGNOSIS — E1165 Type 2 diabetes mellitus with hyperglycemia: Secondary | ICD-10-CM

## 2022-09-26 DIAGNOSIS — G2581 Restless legs syndrome: Secondary | ICD-10-CM | POA: Diagnosis present

## 2022-09-26 DIAGNOSIS — Z7901 Long term (current) use of anticoagulants: Secondary | ICD-10-CM | POA: Diagnosis not present

## 2022-09-26 DIAGNOSIS — Z833 Family history of diabetes mellitus: Secondary | ICD-10-CM

## 2022-09-26 DIAGNOSIS — K219 Gastro-esophageal reflux disease without esophagitis: Secondary | ICD-10-CM | POA: Diagnosis present

## 2022-09-26 DIAGNOSIS — D631 Anemia in chronic kidney disease: Secondary | ICD-10-CM | POA: Diagnosis not present

## 2022-09-26 DIAGNOSIS — I361 Nonrheumatic tricuspid (valve) insufficiency: Secondary | ICD-10-CM | POA: Diagnosis not present

## 2022-09-26 DIAGNOSIS — E66813 Obesity, class 3: Secondary | ICD-10-CM

## 2022-09-26 DIAGNOSIS — W07XXXA Fall from chair, initial encounter: Secondary | ICD-10-CM | POA: Diagnosis not present

## 2022-09-26 DIAGNOSIS — I509 Heart failure, unspecified: Secondary | ICD-10-CM | POA: Diagnosis not present

## 2022-09-26 DIAGNOSIS — Z9841 Cataract extraction status, right eye: Secondary | ICD-10-CM

## 2022-09-26 DIAGNOSIS — Z9842 Cataract extraction status, left eye: Secondary | ICD-10-CM

## 2022-09-26 DIAGNOSIS — Z853 Personal history of malignant neoplasm of breast: Secondary | ICD-10-CM | POA: Insufficient documentation

## 2022-09-26 DIAGNOSIS — I4819 Other persistent atrial fibrillation: Secondary | ICD-10-CM | POA: Diagnosis present

## 2022-09-26 DIAGNOSIS — Z7984 Long term (current) use of oral hypoglycemic drugs: Secondary | ICD-10-CM

## 2022-09-26 DIAGNOSIS — Z79811 Long term (current) use of aromatase inhibitors: Secondary | ICD-10-CM | POA: Diagnosis not present

## 2022-09-26 DIAGNOSIS — Z961 Presence of intraocular lens: Secondary | ICD-10-CM | POA: Diagnosis present

## 2022-09-26 DIAGNOSIS — I4891 Unspecified atrial fibrillation: Secondary | ICD-10-CM

## 2022-09-26 DIAGNOSIS — E662 Morbid (severe) obesity with alveolar hypoventilation: Secondary | ICD-10-CM | POA: Diagnosis present

## 2022-09-26 DIAGNOSIS — R079 Chest pain, unspecified: Secondary | ICD-10-CM | POA: Diagnosis not present

## 2022-09-26 DIAGNOSIS — I081 Rheumatic disorders of both mitral and tricuspid valves: Secondary | ICD-10-CM | POA: Diagnosis not present

## 2022-09-26 DIAGNOSIS — Z800 Family history of malignant neoplasm of digestive organs: Secondary | ICD-10-CM

## 2022-09-26 DIAGNOSIS — Z8249 Family history of ischemic heart disease and other diseases of the circulatory system: Secondary | ICD-10-CM

## 2022-09-26 DIAGNOSIS — I130 Hypertensive heart and chronic kidney disease with heart failure and stage 1 through stage 4 chronic kidney disease, or unspecified chronic kidney disease: Secondary | ICD-10-CM | POA: Diagnosis present

## 2022-09-26 DIAGNOSIS — Z794 Long term (current) use of insulin: Secondary | ICD-10-CM | POA: Diagnosis not present

## 2022-09-26 LAB — BASIC METABOLIC PANEL
Anion gap: 13 (ref 5–15)
BUN: 59 mg/dL — ABNORMAL HIGH (ref 8–23)
CO2: 26 mmol/L (ref 22–32)
Calcium: 9.8 mg/dL (ref 8.9–10.3)
Chloride: 98 mmol/L (ref 98–111)
Creatinine, Ser: 2.2 mg/dL — ABNORMAL HIGH (ref 0.44–1.00)
GFR, Estimated: 24 mL/min — ABNORMAL LOW (ref >60)
Glucose, Bld: 153 mg/dL — ABNORMAL HIGH (ref 70–99)
Potassium: 4.5 mmol/L (ref 3.5–5.1)
Sodium: 137 mmol/L (ref 135–145)

## 2022-09-26 LAB — CBC
HCT: 40 % (ref 36.0–46.0)
Hemoglobin: 12.5 g/dL (ref 12.0–15.0)
MCH: 31.6 pg (ref 26.0–34.0)
MCHC: 31.3 g/dL (ref 30.0–36.0)
MCV: 101.3 fL — ABNORMAL HIGH (ref 80.0–100.0)
Platelets: 250 10*3/uL (ref 150–400)
RBC: 3.95 MIL/uL (ref 3.87–5.11)
RDW: 15.5 % (ref 11.5–15.5)
WBC: 7 10*3/uL (ref 4.0–10.5)
nRBC: 0 % (ref 0.0–0.2)

## 2022-09-26 LAB — TROPONIN I (HIGH SENSITIVITY)
Troponin I (High Sensitivity): 48 ng/L — ABNORMAL HIGH (ref <18)
Troponin I (High Sensitivity): 60 ng/L — ABNORMAL HIGH (ref <18)

## 2022-09-26 LAB — TSH: TSH: 2.005 u[IU]/mL (ref 0.350–4.500)

## 2022-09-26 LAB — BRAIN NATRIURETIC PEPTIDE: B Natriuretic Peptide: 2617.1 pg/mL — ABNORMAL HIGH (ref 0.0–100.0)

## 2022-09-26 LAB — HEPARIN LEVEL (UNFRACTIONATED): Heparin Unfractionated: 0.42 IU/mL (ref 0.30–0.70)

## 2022-09-26 LAB — GLUCOSE, CAPILLARY
Glucose-Capillary: 109 mg/dL — ABNORMAL HIGH (ref 70–99)
Glucose-Capillary: 194 mg/dL — ABNORMAL HIGH (ref 70–99)

## 2022-09-26 MED ORDER — ISOSORB DINITRATE-HYDRALAZINE 20-37.5 MG PO TABS
1.0000 | ORAL_TABLET | Freq: Two times a day (BID) | ORAL | Status: DC
  Administered 2022-09-26: 1 via ORAL
  Filled 2022-09-26: qty 1

## 2022-09-26 MED ORDER — HYDRALAZINE HCL 20 MG/ML IJ SOLN: 5.0000 mg | INTRAMUSCULAR | Status: DC | PRN

## 2022-09-26 MED ORDER — ACETAMINOPHEN 650 MG RE SUPP: 650.0000 mg | Freq: Four times a day (QID) | RECTAL | Status: DC | PRN

## 2022-09-26 MED ORDER — METOPROLOL TARTRATE 25 MG PO TABS: 25.0000 mg | ORAL_TABLET | Freq: Two times a day (BID) | ORAL | Status: DC

## 2022-09-26 MED ORDER — HEPARIN (PORCINE) 25000 UT/250ML-% IV SOLN
1250.0000 [IU]/h | INTRAVENOUS | Status: DC
  Administered 2022-09-26 – 2022-09-29 (×5): 1250 [IU]/h via INTRAVENOUS
  Filled 2022-09-26 (×5): qty 250

## 2022-09-26 MED ORDER — FUROSEMIDE 10 MG/ML IJ SOLN: 80.0000 mg | Freq: Once | INTRAMUSCULAR | Status: DC

## 2022-09-26 MED ORDER — ACETAMINOPHEN 325 MG PO TABS
650.0000 mg | ORAL_TABLET | Freq: Four times a day (QID) | ORAL | Status: DC | PRN
  Administered 2022-09-30 – 2022-10-04 (×3): 650 mg via ORAL
  Filled 2022-09-26 (×3): qty 2

## 2022-09-26 MED ORDER — ATORVASTATIN CALCIUM 40 MG PO TABS
40.0000 mg | ORAL_TABLET | Freq: Every day | ORAL | Status: DC
  Administered 2022-09-26: 40 mg via ORAL
  Filled 2022-09-26: qty 1

## 2022-09-26 MED ORDER — FAMOTIDINE 20 MG PO TABS
20.0000 mg | ORAL_TABLET | Freq: Every day | ORAL | Status: DC
  Administered 2022-09-27 – 2022-10-07 (×11): 20 mg via ORAL
  Filled 2022-09-26 (×11): qty 1

## 2022-09-26 MED ORDER — DORZOLAMIDE HCL-TIMOLOL MAL 2-0.5 % OP SOLN
1.0000 [drp] | Freq: Two times a day (BID) | OPHTHALMIC | Status: DC
  Administered 2022-09-26 – 2022-10-07 (×22): 1 [drp] via OPHTHALMIC
  Filled 2022-09-26: qty 10

## 2022-09-26 MED ORDER — SILDENAFIL CITRATE 20 MG PO TABS
20.0000 mg | ORAL_TABLET | Freq: Three times a day (TID) | ORAL | Status: DC
  Filled 2022-09-26 (×2): qty 1

## 2022-09-26 MED ORDER — INSULIN ASPART 100 UNIT/ML IJ SOLN
0.0000 [IU] | Freq: Three times a day (TID) | INTRAMUSCULAR | Status: DC
  Administered 2022-09-27 (×2): 3 [IU] via SUBCUTANEOUS
  Administered 2022-09-27 – 2022-09-28 (×3): 2 [IU] via SUBCUTANEOUS
  Administered 2022-09-29: 3 [IU] via SUBCUTANEOUS
  Administered 2022-09-29: 1 [IU] via SUBCUTANEOUS
  Administered 2022-09-30 (×2): 5 [IU] via SUBCUTANEOUS
  Administered 2022-09-30: 2 [IU] via SUBCUTANEOUS
  Administered 2022-10-01: 3 [IU] via SUBCUTANEOUS
  Administered 2022-10-01: 8 [IU] via SUBCUTANEOUS
  Administered 2022-10-01: 3 [IU] via SUBCUTANEOUS
  Administered 2022-10-02 (×2): 5 [IU] via SUBCUTANEOUS
  Administered 2022-10-02: 3 [IU] via SUBCUTANEOUS
  Administered 2022-10-03: 5 [IU] via SUBCUTANEOUS
  Administered 2022-10-03: 3 [IU] via SUBCUTANEOUS
  Administered 2022-10-03: 5 [IU] via SUBCUTANEOUS
  Administered 2022-10-04: 8 [IU] via SUBCUTANEOUS

## 2022-09-26 MED ORDER — FUROSEMIDE 10 MG/ML IJ SOLN
80.0000 mg | Freq: Two times a day (BID) | INTRAMUSCULAR | Status: DC
  Administered 2022-09-26 – 2022-09-27 (×4): 80 mg via INTRAVENOUS
  Filled 2022-09-26 (×4): qty 8

## 2022-09-26 MED ORDER — ONDANSETRON HCL 4 MG PO TABS
4.0000 mg | ORAL_TABLET | Freq: Four times a day (QID) | ORAL | Status: DC | PRN
  Administered 2022-09-28: 4 mg via ORAL
  Filled 2022-09-26: qty 1

## 2022-09-26 MED ORDER — LORATADINE 10 MG PO TABS
10.0000 mg | ORAL_TABLET | Freq: Every day | ORAL | Status: DC
  Administered 2022-09-27 – 2022-10-07 (×11): 10 mg via ORAL
  Filled 2022-09-26 (×11): qty 1

## 2022-09-26 MED ORDER — SODIUM CHLORIDE 0.9% FLUSH
3.0000 mL | Freq: Two times a day (BID) | INTRAVENOUS | Status: DC
  Administered 2022-09-26 – 2022-10-06 (×10): 3 mL via INTRAVENOUS

## 2022-09-26 MED ORDER — HEPARIN BOLUS VIA INFUSION
4000.0000 [IU] | Freq: Once | INTRAVENOUS | Status: AC
  Administered 2022-09-26: 4000 [IU] via INTRAVENOUS
  Filled 2022-09-26: qty 4000

## 2022-09-26 MED ORDER — ASPIRIN 81 MG PO TBEC
81.0000 mg | DELAYED_RELEASE_TABLET | Freq: Every day | ORAL | Status: DC
  Administered 2022-09-27: 81 mg via ORAL
  Filled 2022-09-26: qty 1

## 2022-09-26 MED ORDER — ONDANSETRON HCL 4 MG/2ML IJ SOLN: 4.0000 mg | Freq: Four times a day (QID) | INTRAMUSCULAR | Status: DC | PRN

## 2022-09-26 MED ORDER — ATORVASTATIN CALCIUM 40 MG PO TABS
40.0000 mg | ORAL_TABLET | Freq: Every day | ORAL | Status: DC
  Administered 2022-09-27 – 2022-10-07 (×11): 40 mg via ORAL
  Filled 2022-09-26 (×11): qty 1

## 2022-09-26 MED ORDER — SILDENAFIL CITRATE 20 MG PO TABS
20.0000 mg | ORAL_TABLET | Freq: Three times a day (TID) | ORAL | Status: DC
  Administered 2022-09-26 – 2022-10-03 (×19): 20 mg via ORAL
  Filled 2022-09-26 (×22): qty 1

## 2022-09-26 MED ORDER — METOPROLOL TARTRATE 25 MG PO TABS
25.0000 mg | ORAL_TABLET | Freq: Two times a day (BID) | ORAL | Status: DC
  Administered 2022-09-26 – 2022-09-28 (×5): 25 mg via ORAL
  Filled 2022-09-26 (×6): qty 1

## 2022-09-26 MED ORDER — ASPIRIN 81 MG PO TBEC
81.0000 mg | DELAYED_RELEASE_TABLET | Freq: Every day | ORAL | Status: DC
  Administered 2022-09-26: 81 mg via ORAL
  Filled 2022-09-26: qty 1

## 2022-09-26 NOTE — Progress Notes (Signed)
ANTICOAGULATION CONSULT NOTE  Pharmacy Consult for heparin Indication:  atrial flutter  No Known Allergies  Patient Measurements: Height: 5\' 5"  (165.1 cm) Weight: 128.7 kg (283 lb 11.7 oz) IBW/kg (Calculated) : 57 Heparin Dosing Weight: 89.1 kg   Vital Signs: Temp: 97.9 F (36.6 C) (05/14 1942) Temp Source: Oral (05/14 1942) BP: 142/63 (05/14 2000) Pulse Rate: 91 (05/14 2138)  Labs: Recent Labs    09/26/22 1318 09/26/22 1859 09/26/22 2034  HGB 12.5  --   --   HCT 40.0  --   --   PLT 250  --   --   HEPARINUNFRC  --   --  0.42  CREATININE 2.20*  --   --   TROPONINIHS 48* 60*  --     Estimated Creatinine Clearance: 33.1 mL/min (A) (by C-G formula based on SCr of 2.2 mg/dL (H)).   Assessment: 34 yof presented with volume overload. Now found in Aflutter - no AC PTA.   Heparin level 0.42 is therapeutic on heparin 1250 units/hr. No bleeding issues reported.  Goal of Therapy:  Heparin level 0.3-0.7 units/ml Monitor platelets by anticoagulation protocol: Yes   Plan:  Continue heparin drip at 1250 units/hr Check confirmatory heparin level in AM Daily heparin level, CBC Monitor for signs/symptoms of bleeding  Thank you for allowing pharmacy to participate in this patient's care,  Loralee Pacas, PharmD, BCPS 09/26/2022 9:43 PM  Please check AMION for all [facility] Pharmacy phone numbers After 10:00 PM, call Main Pharmacy [phone]

## 2022-09-26 NOTE — Consult Note (Addendum)
Advanced Heart Failure Team Consult Note   Primary Physician: [facility] PCP-Cardiologist:  Dina Rich, MD  Reason for Consultation: Heart Failure   HPI:    Sandra Schneider is seen today for evaluation of heart failiure at the request of Dr Freida Busman .   Sandra Schneider is a 67 year old with a history of CAD (NSTEMI 06/2012 w/ PCI to LAD and Lcx) LVEF 55% at that time, OSA, HTN, hyperlipidemia, hx of breast ca s/p lumpectomy and persistent shortness of breath and lower extremity edema  She had a echocardiogram concerning for pulmonary hypertension followed by right heart cath with a mean PA of 69 mmHg.  Her cardiac output was 7 L/min during that case.  She had a workup for interstitial lung disease by pulmonology with CT chest that was negative.  However there was a right upper lobe lung nodule with negative PET scan.  In addition she has had a colonoscopy and mammogram due to hypermetabolic areas in the colon and breast both of which were also negative.   Interval hx:  Follow up 08/15/22 with Dr. Gasper Lloyd, she was hypervolemic; ReDs 46% and weight 295 lb (up from 276 lbs). She was prescribed Furoscix daily x 2 days, followed by transition back to oral Lasix 80 mg bid, with recs for metolazone 2.5 mg PRN if remains hypervolemic.    HF follow up 08/21/22, weight down 9 lbs (286 lbs), REDs 33%, but she remained volume overloaded. She did note sluggish diuresis with lasix. Lasix 80 bid switched to torsemide 40 bid. Follow up labs showed SCr 1.43-->4.14. She was called and instructed to hold all diuretics and come in for close follow up.  HF follow up 09/06/22. Reds Clip 38%. Torsemide stopped. Switched to lasix 80 mg twice a day.   Admitted 09/07/22 with hypoglycemia. Discharged on lasix 60 mg po twice a day.   CMRI 09/2022 - EF 75% RV moderately dilated Mod TR. Possible myocarditis versus sarcoid.   Presented to HF clinic today for follow up. Marked volume  overload. Weight up 30 pounds. + Orthopnea and lower extremity edema. SOB with exertion. Spends most of her time in bed. Says she has been getting full fast. Yesterday she had 1/2 fish sandwich and french fries. She was sent to the ED.   Review of Systems: [y] = yes, [ ]  = no   General: Weight gain [ Y]; Weight loss [ ] ; Anorexia [ ] ; Fatigue [Y ]; Fever [ ] ; Chills [ ] ; Weakness [Y ]  Cardiac: Chest pain/pressure [ ] ; Resting SOB [ ] ; Exertional SOB [ Y]; Orthopnea [ Y]; Pedal Edema [ Y]; Palpitations [ ] ; Syncope [ ] ; Presyncope [ ] ; Paroxysmal nocturnal dyspnea[ ]   Pulmonary: Cough [ ] ; Wheezing[ ] ; Hemoptysis[ ] ; Sputum [ ] ; Snoring [ ]   GI: Vomiting[ ] ; Dysphagia[ ] ; Melena[ ] ; Hematochezia [ ] ; Heartburn[ ] ; Abdominal pain [ ] ; Constipation [ ] ; Diarrhea [ ] ; BRBPR [ ]   GU: Hematuria[ ] ; Dysuria [ ] ; Nocturia[ ]   Vascular: Pain in legs with walking [ ] ; Pain in feet with lying flat [ ] ; Non-healing sores [ ] ; Stroke [ ] ; TIA [ ] ; Slurred speech [ ] ;  Neuro: Headaches[ ] ; Vertigo[ ] ; Seizures[ ] ; Paresthesias[ ] ;Blurred vision [ ] ; Diplopia [ ] ; Vision changes [ ]   Ortho/Skin: Arthritis [ ] ; Joint pain [Y ]; Muscle pain [ ] ; Joint swelling [ ] ; Back Pain [Y ]; Rash [ ]   Psych: Depression[ ] ; Anxiety[ ]   Heme: Bleeding problems [ ] ; Clotting disorders [ ] ; Anemia [ ]   Endocrine: Diabetes [ ] ; Thyroid dysfunction[ ]   Home Medications Prior to Admission medications   Medication Sig Start Date End Date Taking? Authorizing Provider  Ascorbic Acid (VITAMIN C PO) Take 1 tablet by mouth daily.    [provider]  aspirin EC 81 MG tablet Take 81 mg by mouth daily.    [provider]  atorvastatin (LIPITOR) 40 MG tablet Take 1 tablet by mouth once daily 08/02/22   Antoine Poche, MD  Cholecalciferol (VITAMIN D) 50 MCG (2000 UT) CAPS Take 2,000 Units by mouth daily.    [provider]  dorzolamide-timolol (COSOPT) 22.3-6.8 MG/ML ophthalmic solution Place 1 drop into  both eyes 2 (two) times daily.    [provider]  famotidine (PEPCID) 20 MG tablet Take 20 mg by mouth daily.    [provider]  furosemide (LASIX) 20 MG tablet Take 3 tablets (60 mg total) by mouth 2 (two) times daily. 09/09/22   Vassie Loll, MD  isosorbide-hydrALAZINE (BIDIL) 20-37.5 MG tablet Take 1 tablet by mouth 2 (two) times daily. 09/09/22   Vassie Loll, MD  letrozole [facility]) 2.5 MG tablet Take 2.5 mg by mouth daily. 01/01/20   [provider]  loratadine (CLARITIN) 10 MG tablet Take 10 mg by mouth daily.    [provider]  metFORMIN (GLUCOPHAGE) 1000 MG tablet Take 0.5 tablets (500 mg total) by mouth 2 (two) times daily with a meal. 09/09/22   Vassie Loll, MD  metoprolol tartrate (LOPRESSOR) 50 MG tablet Take 50 mg by mouth 2 (two) times daily.    [provider]  Multiple Vitamin (MULTIVITAMIN) tablet Take 1 tablet by mouth daily.    [provider]  potassium chloride (KLOR-CON M) 10 MEQ tablet Take 2 tablets (20 mEq total) by mouth daily. 09/06/22   Jacklynn Ganong, FNP  sildenafil (REVATIO) 20 MG tablet Take 1 tablet (20 mg total) by mouth 3 (three) times daily. 06/23/22   Dorthula Nettles, DO    Past Medical History: Past Medical History:  Diagnosis Date   Breast cancer (HCC)    Coronary artery disease    Diabetes mellitus    GERD (gastroesophageal reflux disease)    Hypertension    Myocardial abscess    Myocardial infarct (HCC) 06/24/2012   Pulmonary hypertension (HCC)     Past Surgical History: Past Surgical History:  Procedure Laterality Date   ANTERIOR VITRECTOMY Left 07/21/2019   Procedure: ANTERIOR VITRECTOMY;  Surgeon: Fabio Pierce, MD;  Location: [facility];  Service: Ophthalmology;  Laterality: Left;   ANTERIOR VITRECTOMY Right 08/04/2019   Procedure: ANTERIOR VITRECTOMY;  Surgeon: Fabio Pierce, MD;  Location: [facility];  Service: Ophthalmology;  Laterality: Right;   CARDIAC CATHETERIZATION      CATARACT EXTRACTION W/PHACO Left 07/21/2019   Procedure: CATARACT EXTRACTION PHACO AND INTRAOCULAR LENS PLACEMENT (IOC) (CDE: 11.45);  Surgeon: Fabio Pierce, MD;  Location: [facility];  Service: Ophthalmology;  Laterality: Left;   CATARACT EXTRACTION W/PHACO Right 08/04/2019   Procedure: CATARACT EXTRACTION PHACO AND INTRAOCULAR LENS PLACEMENT (IOC);  Surgeon: Fabio Pierce, MD;  Location: [facility];  Service: Ophthalmology;  Laterality: Right;  CDE: 9.78   CESAREAN SECTION     COLONOSCOPY WITH PROPOFOL N/A 05/22/2022   Procedure: COLONOSCOPY WITH PROPOFOL;  Surgeon: Lanelle Bal, DO;  Location: [facility];  Service: Endoscopy;  Laterality: N/A;  10:00 am   CORONARY STENT PLACEMENT N/A 06/24/2012  LEFT HEART CATHETERIZATION WITH CORONARY ANGIOGRAM N/A 06/24/2012   Procedure: LEFT HEART CATHETERIZATION WITH CORONARY ANGIOGRAM;  Surgeon: Dolores Patty, MD;  Location: [facility] CATH LAB;  Service: Cardiovascular;  Laterality: N/A;   right breast lumpectomy     RIGHT/LEFT HEART CATH AND CORONARY ANGIOGRAPHY N/A 02/07/2022   Procedure: RIGHT/LEFT HEART CATH AND CORONARY ANGIOGRAPHY;  Surgeon: Corky Crafts, MD;  Location: [facility] INVASIVE CV LAB;  Service: Cardiovascular;  Laterality: N/A;    Family History: Family History  Problem Relation Age of Onset   Heart disease Father    Diabetes Other    Hypertension Other    Cancer Other    Colon cancer Son 22       alive, doing well.    Social History: Social History   Socioeconomic History   Marital status: Widowed    Spouse name: Not on file   Number of children: Not on file   Years of education: Not on file   Highest education level: Not on file  Occupational History   Not on file  Tobacco Use   Smoking status: Never    Passive exposure: Never   Smokeless tobacco: Never  Vaping Use   Vaping Use: Never used  Substance and Sexual Activity   Alcohol use: No   Drug use: No   Sexual activity: Yes    Birth control/protection:  Post-menopausal  Other Topics Concern   Not on file  Social History Narrative   Not on file   Social Determinants of Health   Financial Resource Strain: Not on file  Food Insecurity: No Food Insecurity (09/08/2022)   Hunger Vital Sign    Worried About Running Out of Food in the Last Year: Never true    Ran Out of Food in the Last Year: Never true  Transportation Needs: No Transportation Needs (09/08/2022)   PRAPARE - Administrator, Civil Service (Medical): No    Lack of Transportation (Non-Medical): No  Physical Activity: Not on file  Stress: Not on file  Social Connections: Not on file    Allergies:  No Known Allergies  Objective:    Vital Signs:   Pulse Rate:  [84-88] 84 (05/14 1030) Resp:  [18] 18 (05/14 1030) BP: (124-130)/(80-94) 124/94 (05/14 1030) SpO2:  [93 %-95 %] 95 % (05/14 1030) Weight:  [130.6 kg] 130.6 kg (05/14 0922)    Weight change: There were no vitals filed for this visit.  Intake/Output:  No intake or output data in the 24 hours ending 09/26/22 1123    Physical Exam    General:   No resp difficulty HEENT: normal Neck: supple. JVP difficult to asssess given body habitus.  Carotids 2+ bilat; no bruits. No lymphadenopathy or thyromegaly appreciated. Cor: PMI nondisplaced. Regular rate & rhythm. No rubs, gallops or murmurs. Lungs: clear Abdomen: soft, nontender, distended. No hepatosplenomegaly. No bruits or masses. Good bowel sounds. Extremities: no cyanosis, clubbing, rash, R and LLE 2+ edema Neuro: alert & orientedx3, cranial nerves grossly intact. moves all 4 extremities w/o difficulty. Affect pleasant   Telemetry   A flutter 70s   EKG    A flutter 80 bpm   Labs   Basic Metabolic Panel: Recent Labs  Lab 09/20/22 1105  NA 139  K 4.2  CL 95*  CO2 23  GLUCOSE 180*  BUN 55*  CREATININE 2.16*  CALCIUM 9.8    Liver Function Tests: No results for input(s): "AST", "ALT", "ALKPHOS", "BILITOT", "PROT", "ALBUMIN" in the  last  168 hours. No results for input(s): "LIPASE", "AMYLASE" in the last 168 hours. No results for input(s): "AMMONIA" in the last 168 hours.  CBC: Recent Labs  Lab 09/20/22 1105  WBC 6.9  NEUTROABS 5.5  HGB 12.4  HCT 38.0  MCV 96  PLT 213    Cardiac Enzymes: No results for input(s): "CKTOTAL", "CKMB", "CKMBINDEX", "TROPONINI" in the last 168 hours.  BNP: BNP (last 3 results) Recent Labs    08/21/22 0911 09/06/22 1611 09/20/22 1105  BNP 1,665.2* 1,807.9* 2,885.7*    ProBNP (last 3 results) No results for input(s): "PROBNP" in the last 8760 hours.   CBG: No results for input(s): "GLUCAP" in the last 168 hours.  Coagulation Studies: No results for input(s): "LABPROT", "INR" in the last 72 hours.   Imaging   DG Chest 2 View  Result Date: 09/25/2022 CLINICAL DATA:  SOB EXAM: CHEST - 2 VIEW COMPARISON:  09/08/2022 FINDINGS: Cardiomediastinal silhouette and pulmonary vasculature are within normal limits. 9 mm right upper lobe lung nodule is again seen, but better appreciated on prior CT from 03/02/2022. Follow-up chest CT should be performed when patient condition allows as suggested by PET-CT from 03/30/2022. Lungs are otherwise clear. Calcifications adjacent to the right greater tuberosity consistent with calcific rotator cuff tendinosis. IMPRESSION: 1. No acute cardiopulmonary process. 2. 9 mm right upper lobe pulmonary nodule again seen. Follow-up chest CT should be performed when patient condition allows as suggested by PET-CT from 03/30/2022. Electronically Signed   By: Acquanetta Belling M.D.   On: 09/25/2022 11:56   US Venous Img Lower Bilateral (DVT)  Result Date: 09/25/2022 CLINICAL DATA:  Worsening shortness of breath Edema History of breast cancer EXAM: Bilateral lower Extremity Venous Doppler Ultrasound TECHNIQUE: Gray-scale sonography with compression, as well as color and duplex ultrasound, were performed to evaluate the deep venous system(s) from the level of the  common femoral vein through the popliteal and proximal calf veins. COMPARISON:  None available FINDINGS: VENOUS Normal compressibility of the common femoral, superficial femoral, and popliteal veins, as well as the visualized calf veins. Visualized portions of profunda femoral vein and great saphenous vein unremarkable. No filling defects to suggest DVT on grayscale or color Doppler imaging. Doppler waveforms show normal direction of venous flow, normal respiratory plasticity and response to augmentation. OTHER None. Limitations: none IMPRESSION: No  lower extremity DVT. Electronically Signed   By: Acquanetta Belling M.D.   On: 09/25/2022 11:53     Medications:     Current Medications:  sodium chloride flush  3 mL Intravenous Q12H    Infusions:     Patient Profile   Sandra Schneider is a 67 year old with a history of CAD (NSTEMI 06/2012 w/ PCI to LAD and Lcx) LVEF 55% at that time, OSA, HTN, hyperlipidemia, hx of breast ca s/p lumpectomy and HFpEF.   Admitting with A/C HFpEF--RV Failure and new onset Atrial Flutter.   Assessment/Plan   1. A/C HFpEF -->RV Failure/Pulmonary HTN RHC (9/23) waveforms reviewed personally. RA 30, RV 107/20-30, PA 107/48 (69), unable to wedge; LVEDP 15. AO 140/64. LVEDP ~16. Assuming a PCWP of 20, TPG would be 49 w/ PVR of 7.  - Hemodynamics consistent with severe pulmonary hypertension largely due to pre-capillary PH.  - Labs: ANA, HCV negative. HIV pending. LFTs mostly unremarkable.  - PFTs w/ moderate restriction.  - CT chest w/ pulmonary nodules, however, follow up PET mostly unremarkable.  - Echo (3/24): RV severely enlarged w/ severely reduced RVSF .  -  CMRI 09/2022 - EF 75% RV moderately dilated Mod TR. Possible myocarditis versus sarcoid.  Failed outpatient oral diuretics. Recently torsemide was stopped and she was switched to lasix 60 mg twice a day.  Presented to HF clinic with marked volume overload.  Will need IV diuretics. Start 80 mg IV lasix twice a  day.  Once diuresed will need RHC.  Restart sildenafil for now.   2. New Atrial Flutter EKG ---> A flutter with controlled rate.  Start heparin drip for now. Eventual switch to DOAC.  Start lopressor 25 mg twice a day   Will need to diurese for now.  If she doesn't convert will need TEE/DC-CV   3. OSA On CPAP at home. Followed by Pulmonary   4. CAD No chest pain. Restart statin and aspirin.   5.  DMII  6. HTN  Stable.  Prior to admit she was on bidil. Would not restart with sildenafil   Labs pending.   Length of Stay: 0  Tonye Becket, NP  09/26/2022, 11:23 AM  Advanced Heart Failure Team Pager [phone] (M-F; 7a - 5p)  Please contact [facility] for night-coverage after hours (4p -7a ) and weekends on [email or web address]

## 2022-09-26 NOTE — Progress Notes (Addendum)
1945 patient alert x4 on 2l [place] able to make all needs known sitting in chair call light in reach heparin running as ordered

## 2022-09-26 NOTE — ED Notes (Signed)
ED TO INPATIENT HANDOFF REPORT  ED Nurse Name and Phone #: [phone]  S Name/Age/Gender Sandra Schneider 67 y.o. female Room/Bed: 030C/030C  Code Status   Code Status: Full Code  Home/SNF/Other Home Patient oriented to: self, place, time, and situation Is this baseline? Yes   Triage Complete: Triage complete  Chief Complaint New onset atrial flutter [facility]) [I48.92]  Triage Note Pt to the ed from home doctors appt with a CC of increased sob. Pt relay hx of CHF and feel she is retaining more fluid. Pt denies chest pain, dizziness, loc.    Allergies No Known Allergies  Level of Care/Admitting Diagnosis ED Disposition     ED Disposition  Admit   Condition  --   Comment  Hospital Area: [facility] : [facility] [[id number]]  Level of Care: Telemetry Cardiac [[id number]]  May admit patient to [facility] or [facility] if equivalent level of care is available:: No  Covid Evaluation: Asymptomatic - no recent exposure (last 10 days) testing not required  Diagnosis: New onset atrial flutter [facility]) [[id number]]  Admitting Physician: Jonah Blue [[id number]]  Attending Physician: Jonah Blue [[id number]]  Certification:: I certify this patient will need inpatient services for at least 2 midnights  Estimated Length of Stay: 3          B Medical/Surgery History Past Medical History:  Diagnosis Date   Breast cancer (HCC)    Coronary artery disease    Diabetes mellitus    GERD (gastroesophageal reflux disease)    Hypertension    Myocardial abscess    Myocardial infarct (HCC) 06/24/2012   Pulmonary hypertension (HCC)    Past Surgical History:  Procedure Laterality Date   ANTERIOR VITRECTOMY Left 07/21/2019   Procedure: ANTERIOR VITRECTOMY;  Surgeon: Fabio Pierce, MD;  Location: [facility] ORS;  Service: Ophthalmology;  Laterality: Left;   ANTERIOR VITRECTOMY Right 08/04/2019   Procedure: ANTERIOR VITRECTOMY;  Surgeon: Fabio Pierce, MD;  Location: [facility];  Service: Ophthalmology;   Laterality: Right;   CARDIAC CATHETERIZATION     CATARACT EXTRACTION W/PHACO Left 07/21/2019   Procedure: CATARACT EXTRACTION PHACO AND INTRAOCULAR LENS PLACEMENT (IOC) (CDE: 11.45);  Surgeon: Fabio Pierce, MD;  Location: [facility];  Service: Ophthalmology;  Laterality: Left;   CATARACT EXTRACTION W/PHACO Right 08/04/2019   Procedure: CATARACT EXTRACTION PHACO AND INTRAOCULAR LENS PLACEMENT (IOC);  Surgeon: Fabio Pierce, MD;  Location: [facility];  Service: Ophthalmology;  Laterality: Right;  CDE: 9.78   CESAREAN SECTION     COLONOSCOPY WITH PROPOFOL N/A 05/22/2022   Procedure: COLONOSCOPY WITH PROPOFOL;  Surgeon: Lanelle Bal, DO;  Location: [facility] END[facility] SUITE;  Service: Endoscopy;  Laterality: N/A;  10:00 am   CORONARY STENT PLACEMENT N/A 06/24/2012   LEFT HEART CATHETERIZATION WITH CORONARY ANGIOGRAM N/A 06/24/2012   Procedure: LEFT HEART CATHETERIZATION WITH CORONARY ANGIOGRAM;  Surgeon: Dolores Patty, MD;  Location: [facility] CATH LAB;  Service: Cardiovascular;  Laterality: N/A;   right breast lumpectomy     RIGHT/LEFT HEART CATH AND CORONARY ANGIOGRAPHY N/A 02/07/2022   Procedure: RIGHT/LEFT HEART CATH AND CORONARY ANGIOGRAPHY;  Surgeon: Corky Crafts, MD;  Location: [facility] INVASIVE CV LAB;  Service: Cardiovascular;  Laterality: N/A;     A IV Location/Drains/Wounds Patient Lines/Drains/Airways Status     Active Line/Drains/Airways     Name Placement date Placement time Site Days   Peripheral IV 09/26/22 20 G 1.88" Anterior;Left Forearm 09/26/22  1322  Forearm  less than 1  Intake/Output Last 24 hours No intake or output data in the 24 hours ending 09/26/22 1714  Labs/Imaging Results for orders placed or performed during the hospital encounter of 09/26/22 (from the past 48 hour(s))  Brain natriuretic peptide     Status: Abnormal   Collection Time: 09/26/22  1:18 PM  Result Value Ref Range   B Natriuretic Peptide 2,617.1 (H) 0.0 - 100.0 pg/mL    Comment: Performed  at [facility], [address], [place], [place] [address]  Basic metabolic panel     Status: Abnormal   Collection Time: 09/26/22  1:18 PM  Result Value Ref Range   Sodium 137 135 - 145 mmol/L   Potassium 4.5 3.5 - 5.1 mmol/L   Chloride 98 98 - 111 mmol/L   CO2 26 22 - 32 mmol/L   Glucose, Bld 153 (H) 70 - 99 mg/dL    Comment: Glucose reference range applies only to samples taken after fasting for at least 8 hours.   BUN 59 (H) 8 - 23 mg/dL   Creatinine, Ser 6.04 (H) 0.44 - 1.00 mg/dL   Calcium 9.8 8.9 - 54.0 mg/dL   GFR, Estimated 24 (L) >60 mL/min    Comment: (NOTE) Calculated using the CKD-EPI Creatinine Equation (2021)    Anion gap 13 5 - 15    Comment: Performed at [facility], [address], [place], [place] [address]  Troponin I (High Sensitivity)     Status: Abnormal   Collection Time: 09/26/22  1:18 PM  Result Value Ref Range   Troponin I (High Sensitivity) 48 (H) <18 ng/L    Comment: (NOTE) Elevated high sensitivity troponin I (hsTnI) values and significant  changes across serial measurements may suggest ACS but many other  chronic and acute conditions are known to elevate hsTnI results.  Refer to the "Links" section for chest pain algorithms and additional  guidance. Performed at [facility], [address] [place], [place] [address]   CBC     Status: Abnormal   Collection Time: 09/26/22  1:18 PM  Result Value Ref Range   WBC 7.0 4.0 - 10.5 K/uL   RBC 3.95 3.87 - 5.11 MIL/uL   Hemoglobin 12.5 12.0 - 15.0 g/dL   HCT 95.6 21.3 - 08.6 %   MCV 101.3 (H) 80.0 - 100.0 fL   MCH 31.6 26.0 - 34.0 pg   MCHC 31.3 30.0 - 36.0 g/dL   RDW 57.8 46.9 - 62.9 %   Platelets 250 150 - 400 K/uL   nRBC 0.0 0.0 - 0.2 %    Comment: Performed at [facility], [address] [place], [place] [address]   DG Chest 2 View  Result Date: 09/26/2022 CLINICAL DATA:  Shortness of breath EXAM: CHEST - 2 VIEW COMPARISON:  09/25/2022 and prior radiographs  FINDINGS: The cardiomediastinal silhouette is unremarkable. RIGHT UPPER lobe nodule and mild peribronchial thickening are again identified. There is no evidence of airspace disease, consolidation, mass, pleural effusion or pneumothorax. No acute bony abnormalities are identified. IMPRESSION: 1. No evidence of acute cardiopulmonary disease. 2. RIGHT UPPER lobe nodule, chest CT follow-up recommended as per 03/30/2022 PET CT report. Electronically Signed   By: Harmon Pier M.D.   On: 09/26/2022 12:37   DG Chest 2 View  Result Date: 09/25/2022 CLINICAL DATA:  SOB EXAM: CHEST - 2 VIEW COMPARISON:  09/08/2022 FINDINGS: Cardiomediastinal silhouette and pulmonary vasculature are within normal limits. 9 mm right upper lobe lung  nodule is again seen, but better appreciated on prior CT from 03/02/2022. Follow-up chest CT should be performed when patient condition allows as suggested by PET-CT from 03/30/2022. Lungs are otherwise clear. Calcifications adjacent to the right greater tuberosity consistent with calcific rotator cuff tendinosis. IMPRESSION: 1. No acute cardiopulmonary process. 2. 9 mm right upper lobe pulmonary nodule again seen. Follow-up chest CT should be performed when patient condition allows as suggested by PET-CT from 03/30/2022. Electronically Signed   By: Acquanetta Belling M.D.   On: 09/25/2022 11:56   US Venous Img Lower Bilateral (DVT)  Result Date: 09/25/2022 CLINICAL DATA:  Worsening shortness of breath Edema History of breast cancer EXAM: Bilateral lower Extremity Venous Doppler Ultrasound TECHNIQUE: Gray-scale sonography with compression, as well as color and duplex ultrasound, were performed to evaluate the deep venous system(s) from the level of the common femoral vein through the popliteal and proximal calf veins. COMPARISON:  None available FINDINGS: VENOUS Normal compressibility of the common femoral, superficial femoral, and popliteal veins, as well as the visualized calf veins. Visualized  portions of profunda femoral vein and great saphenous vein unremarkable. No filling defects to suggest DVT on grayscale or color Doppler imaging. Doppler waveforms show normal direction of venous flow, normal respiratory plasticity and response to augmentation. OTHER None. Limitations: none IMPRESSION: No  lower extremity DVT. Electronically Signed   By: Acquanetta Belling M.D.   On: 09/25/2022 11:53    Pending Labs Unresulted Labs (From admission, onward)     Start     Ordered   09/27/22 0500  Heparin level (unfractionated)  Daily,   R      09/26/22 1224   09/27/22 0500  Basic metabolic panel  Daily,   R     Question:  Specimen collection method  Answer:  IV Team=IV Team collect   09/26/22 1452   09/27/22 0500  CBC  Tomorrow morning,   R       Question:  Specimen collection method  Answer:  IV Team=IV Team collect   09/26/22 1452   09/27/22 0500  Magnesium  Tomorrow morning,   R       Question:  Specimen collection method  Answer:  IV Team=IV Team collect   09/26/22 1452   09/26/22 2000  Heparin level (unfractionated)  Once-Timed,   URGENT        09/26/22 1224   09/26/22 1608  TSH  (Atrial Fibrillation / Flutter)  Once,   R       Question:  Specimen collection method  Answer:  IV Team=IV Team collect   09/26/22 1609            Vitals/Pain Today's Vitals   09/26/22 1030 09/26/22 1330  BP: (!) 124/94 120/80  Pulse: 84 80  Resp: 18 18  Temp:  97.9 F (36.6 C)  TempSrc:  Oral  SpO2: 95% 97%  Weight: 130.6 kg   Height: 5\' 5"  (1.651 m)     Isolation Precautions No active isolations  Medications Medications  furosemide (LASIX) injection 80 mg (80 mg Intravenous Given 09/26/22 1341)  heparin ADULT infusion 100 units/mL (25000 units/274mL) (1,250 Units/hr Intravenous New Bag/Given 09/26/22 1346)  aspirin EC tablet 81 mg (has no administration in time range)  atorvastatin (LIPITOR) tablet 40 mg (has no administration in time range)  isosorbide-hydrALAZINE (BIDIL) 20-37.5 MG per  tablet 1 tablet (has no administration in time range)  metoprolol tartrate (LOPRESSOR) tablet 25 mg (has no administration in time range)  sildenafil (REVATIO) tablet  20 mg (has no administration in time range)  famotidine (PEPCID) tablet 20 mg (has no administration in time range)  insulin aspart (novoLOG) injection 0-15 Units (has no administration in time range)  acetaminophen (TYLENOL) tablet 650 mg (has no administration in time range)    Or  acetaminophen (TYLENOL) suppository 650 mg (has no administration in time range)  ondansetron (ZOFRAN) tablet 4 mg (has no administration in time range)    Or  ondansetron (ZOFRAN) injection 4 mg (has no administration in time range)  hydrALAZINE (APRESOLINE) injection 5 mg (has no administration in time range)  sodium chloride flush (NS) 0.9 % injection 3 mL (has no administration in time range)  heparin bolus via infusion 4,000 Units (4,000 Units Intravenous Bolus from Bag 09/26/22 1347)    Mobility walks with device     Focused Assessments Cardiac Assessment Handoff:    Lab Results  Component Value Date   TROPONINI <0.30 06/16/2013   Lab Results  Component Value Date   DDIMER 1.96 (H) 09/20/2022   Does the Patient currently have chest pain? No    R Recommendations: See Admitting Provider Note  Report given to:   Additional Notes:

## 2022-09-26 NOTE — ED Provider Notes (Signed)
[place] EMERGENCY DEPARTMENT AT [facility] Provider Note   CSN: [id number] Arrival date & time: 09/26/22  1004     History  Chief Complaint  Patient presents with   Shortness of Breath    Sandra Schneider is a 67 y.o. female with PMH of CAD, OSA, HTN, HLD, pulmonary HTN and CHF presenting with worsening shortness of breath.  Patient reports having some shortness of breath at baseline but worse within the last week with minimal exertion.  In addition has noted worsening abdominal swelling, lower extremity edema and orthopnea.  2 weeks ago Lasix was increased to 60 mg twice daily which patient endorsed initially having good urine output but has since plateaued.  Had appointment with her cardiologist today who advised her to go to the ED for diuresis after she was noted to be retaining fluid.   Most recent echo in 4/23 shows EF of 75% with normal left ventricular function however shows increased right ventricular pressure at 45.9 and newly reduced right ventricular systolic function.    Home Medications Prior to Admission medications   Medication Sig Start Date End Date Taking? Authorizing Provider  Ascorbic Acid (VITAMIN C PO) Take 1 tablet by mouth daily.    [provider]  aspirin EC 81 MG tablet Take 81 mg by mouth daily.    [provider]  atorvastatin (LIPITOR) 40 MG tablet Take 1 tablet by mouth once daily 08/02/22   Antoine Poche, MD  Cholecalciferol (VITAMIN D) 50 MCG (2000 UT) CAPS Take 2,000 Units by mouth daily.    [provider]  dorzolamide-timolol (COSOPT) 22.3-6.8 MG/ML ophthalmic solution Place 1 drop into both eyes 2 (two) times daily.    [provider]  famotidine (PEPCID) 20 MG tablet Take 20 mg by mouth daily.    [provider]  furosemide (LASIX) 20 MG tablet Take 3 tablets (60 mg total) by mouth 2 (two) times daily. 09/09/22   Vassie Loll, MD  isosorbide-hydrALAZINE (BIDIL) 20-37.5 MG tablet  Take 1 tablet by mouth 2 (two) times daily. 09/09/22   Vassie Loll, MD  letrozole [facility]) 2.5 MG tablet Take 2.5 mg by mouth daily. 01/01/20   [provider]  loratadine (CLARITIN) 10 MG tablet Take 10 mg by mouth daily.    [provider]  metFORMIN (GLUCOPHAGE) 1000 MG tablet Take 0.5 tablets (500 mg total) by mouth 2 (two) times daily with a meal. 09/09/22   Vassie Loll, MD  metoprolol tartrate (LOPRESSOR) 50 MG tablet Take 50 mg by mouth 2 (two) times daily.    [provider]  Multiple Vitamin (MULTIVITAMIN) tablet Take 1 tablet by mouth daily.    [provider]  potassium chloride (KLOR-CON M) 10 MEQ tablet Take 2 tablets (20 mEq total) by mouth daily. 09/06/22   Jacklynn Ganong, FNP  sildenafil (REVATIO) 20 MG tablet Take 1 tablet (20 mg total) by mouth 3 (three) times daily. 06/23/22   Sabharwal, Eliezer Lofts, DO      Allergies    Patient has no known allergies.    Review of Systems   Review of Systems  Constitutional:  Negative for chills, diaphoresis, fatigue and fever.  Respiratory:  Positive for cough and shortness of breath. Negative for choking, chest tightness and wheezing.   Cardiovascular:  Positive for leg swelling. Negative for chest pain and palpitations.  Gastrointestinal:  Positive for abdominal distention and abdominal pain. Negative for blood in stool, diarrhea and vomiting.    Physical  Exam Updated Vital Signs BP 120/80 (BP Location: Right Arm)   Pulse 80   Temp 97.9 F (36.6 C) (Oral)   Resp 18   Ht 5\' 5"  (1.651 m)   Wt 130.6 kg Comment: From HF clinic  LMP 01/14/2011   SpO2 97%   BMI 47.92 kg/m  Physical Exam Constitutional:      Appearance: She is well-developed. She is obese.  Eyes:     Extraocular Movements: Extraocular movements intact.     Pupils: Pupils are equal, round, and reactive to light.  Cardiovascular:     Rate and Rhythm: Rhythm irregular.     Pulses: Normal pulses.  Pulmonary:     Effort:  Pulmonary effort is normal.     Breath sounds: Examination of the right-lower field reveals rales. Examination of the left-lower field reveals rales. Rales present.  Abdominal:     Tenderness: There is no guarding or rebound.     Comments: Obese, globally enlarged, tight   Skin:    General: Skin is warm and dry.  Neurological:     General: No focal deficit present.     Mental Status: She is alert and oriented to person, place, and time.  Psychiatric:        Mood and Affect: Mood normal.        Behavior: Behavior normal.     ED Results / Procedures / Treatments   Labs (all labs ordered are listed, but only abnormal results are displayed) Labs Reviewed  BRAIN NATRIURETIC PEPTIDE - Abnormal; Notable for the following components:      Result Value   B Natriuretic Peptide 2,617.1 (*)    All other components within normal limits  CBC - Abnormal; Notable for the following components:   MCV 101.3 (*)    All other components within normal limits  BASIC METABOLIC PANEL  HEPARIN LEVEL (UNFRACTIONATED)  TROPONIN I (HIGH SENSITIVITY)    EKG EKG Interpretation  Date/Time:  Tuesday Sep 26 2022 12:11:55 EDT Ventricular Rate:  80 PR Interval:    QRS Duration: 152 QT Interval:  423 QTC Calculation: 488 R Axis:   147 Text Interpretation: Atrial flutter Ventricular premature complex Nonspecific intraventricular conduction delay Borderline repolarization abnormality 12 Lead; Mason-Likar Confirmed by Lorre Nick ([id number]) on 09/26/2022 1:15:45 PM  Radiology DG Chest 2 View  Result Date: 09/26/2022 CLINICAL DATA:  Shortness of breath EXAM: CHEST - 2 VIEW COMPARISON:  09/25/2022 and prior radiographs FINDINGS: The cardiomediastinal silhouette is unremarkable. RIGHT UPPER lobe nodule and mild peribronchial thickening are again identified. There is no evidence of airspace disease, consolidation, mass, pleural effusion or pneumothorax. No acute bony abnormalities are identified. IMPRESSION: 1. No  evidence of acute cardiopulmonary disease. 2. RIGHT UPPER lobe nodule, chest CT follow-up recommended as per 03/30/2022 PET CT report. Electronically Signed   By: Harmon Pier M.D.   On: 09/26/2022 12:37   DG Chest 2 View  Result Date: 09/25/2022 CLINICAL DATA:  SOB EXAM: CHEST - 2 VIEW COMPARISON:  09/08/2022 FINDINGS: Cardiomediastinal silhouette and pulmonary vasculature are within normal limits. 9 mm right upper lobe lung nodule is again seen, but better appreciated on prior CT from 03/02/2022. Follow-up chest CT should be performed when patient condition allows as suggested by PET-CT from 03/30/2022. Lungs are otherwise clear. Calcifications adjacent to the right greater tuberosity consistent with calcific rotator cuff tendinosis. IMPRESSION: 1. No acute cardiopulmonary process. 2. 9 mm right upper lobe pulmonary nodule again seen. Follow-up chest CT should be performed when patient  condition allows as suggested by PET-CT from 03/30/2022. Electronically Signed   By: Acquanetta Belling M.D.   On: 09/25/2022 11:56   US Venous Img Lower Bilateral (DVT)  Result Date: 09/25/2022 CLINICAL DATA:  Worsening shortness of breath Edema History of breast cancer EXAM: Bilateral lower Extremity Venous Doppler Ultrasound TECHNIQUE: Gray-scale sonography with compression, as well as color and duplex ultrasound, were performed to evaluate the deep venous system(s) from the level of the common femoral vein through the popliteal and proximal calf veins. COMPARISON:  None available FINDINGS: VENOUS Normal compressibility of the common femoral, superficial femoral, and popliteal veins, as well as the visualized calf veins. Visualized portions of profunda femoral vein and great saphenous vein unremarkable. No filling defects to suggest DVT on grayscale or color Doppler imaging. Doppler waveforms show normal direction of venous flow, normal respiratory plasticity and response to augmentation. OTHER None. Limitations: none  IMPRESSION: No  lower extremity DVT. Electronically Signed   By: Acquanetta Belling M.D.   On: 09/25/2022 11:53    Procedures Procedures   Medications Ordered in ED Medications  furosemide (LASIX) injection 80 mg (80 mg Intravenous Given 09/26/22 1341)  heparin ADULT infusion 100 units/mL (25000 units/239mL) (1,250 Units/hr Intravenous New Bag/Given 09/26/22 1346)  atorvastatin (LIPITOR) tablet 40 mg (40 mg Oral Given 09/26/22 1341)  aspirin EC tablet 81 mg (81 mg Oral Given 09/26/22 1341)  sildenafil (REVATIO) tablet 20 mg (has no administration in time range)  metoprolol tartrate (LOPRESSOR) tablet 25 mg (has no administration in time range)  heparin bolus via infusion 4,000 Units (4,000 Units Intravenous Bolus from Bag 09/26/22 1347)    ED Course/ Medical Decision Making/ A&P  11:53am : Spoke with Trish from HF who recommend admission with medicine and Cardiology will be following.   15:18: Spoke with Dr. Ophelia Charter with [facility] who will be admitting patient.   Medical Decision Making Amount and/or Complexity of Data Reviewed Labs: ordered.    Details: Ordered and reviewed lab independently with BNP 2617.1.  BMP shows elevated creatinine of 2.20  Troponin is 48  Radiology: ordered and independent interpretation performed.    Details: Chest x-ray was remarkable for any cardiopulmonary findings. ECG/medicine tests: ordered and independent interpretation performed.    Details: EKG show Aflutter.  I reviewed and independently interpreted readings.   Risk Prescription drug management.   67 year old female with history of heart failure and pulmonary hypertension presenting with worsening shortness of breath and  lower extremity edema that is more prominent in the abdomen.  Her labs show elevated BNP to 2617.1 and EKG show new Aflutter. Cardiology has been consulted and in agreement with admission for diuresis . Will admits with to hospitalist.   Final Clinical Impression(s) / ED  Diagnoses Final diagnoses:  None    Rx / DC Orders ED Discharge Orders     None         Jerre Simon, MD 09/26/22 1523    Lorre Nick, MD 09/27/22 [phone]

## 2022-09-26 NOTE — Progress Notes (Signed)
ReDS Vest / Clip - 09/26/22 0900       ReDS Vest / Clip   Station Marker B    Ruler Value 32    ReDS Value Range High volume overload    ReDS Actual Value 45

## 2022-09-26 NOTE — Progress Notes (Signed)
Spoke with pt and notified of results per Dr. Wert. Pt verbalized understanding and denied any questions. 

## 2022-09-26 NOTE — Telephone Encounter (Signed)
Pharmacy Patient Advocate Encounter  Insurance verification completed.    The patient is insured through [employer] Medicare Part D   The patient is currently admitted and ran test claims for the following: Eliquis, Xarelto.  Copays and coinsurance results were relayed to Inpatient clinical team.      

## 2022-09-26 NOTE — ED Triage Notes (Signed)
Pt to the ed from home doctors appt with a CC of increased sob. Pt relay hx of CHF and feel she is retaining more fluid. Pt denies chest pain, dizziness, loc.

## 2022-09-26 NOTE — TOC Benefit Eligibility Note (Signed)
Patient Product/process development scientist completed.    The patient is currently admitted and upon discharge could be taking Eliquis 5 mg.  The current 30 day co-pay is $11.20.   The patient is currently admitted and upon discharge could be taking Xarelto 20 mg.  The current 30 day co-pay is $11.20.   The patient is insured through [employer] Part D   This test claim was processed through [facility]- copay amounts may vary at other pharmacies due to pharmacy/plan contracts, or as the patient moves through the different stages of their insurance plan.  Roland Earl, CPHT Pharmacy Patient Advocate Specialist [facility] Patient Advocate Team Direct Number: [phone]  Fax: [phone]

## 2022-09-26 NOTE — Progress Notes (Signed)
ADVANCED HEART FAILURE CLINIC NOTE  Referring Physician: [facility], Sandra Schneider*  Primary Care: [facility], [facility] Primary Cardiologist: Dr. Dina Schneider  HPI: Sandra Schneider is a 67 y.o. female with CAD (NSTEMI 06/2012 w/ PCI to LAD and Lcx) LVEF 55% at that time, OSA, HTN, hyperlipidemia, hx of breast ca s/p lumpectomy and persistent shortness of breath and lower extremity edema presenting today for evaluation of pulmonary hypertension. She also follows Sandra Schneider for OSA.  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  She has noticed that over the past several months she has had worsening lower extremity edema and difficulty ambulating due to shortness of breath.  She had a echocardiogram concerning for pulmonary hypertension followed by right heart cath with a mean PA of 69 mmHg.  Her cardiac output was 7 L/min during that case.  She had a workup for interstitial lung disease by pulmonology with CT chest that was negative.  However there was a right upper lobe lung nodule with negative PET scan.  In addition she has had a colonoscopy and mammogram due to hypermetabolic areas in the colon and breast both of which were also negative.  Interval hx:  Today patient is severely hypervolemic with NYHA III-IV symptoms.  She is unable to lay flat or walk more than 5 feet without dyspnea.  The symptoms are likely secondary to dietary indiscretion.   Activity level/exercise tolerance: NYHA III-IV severely volume overloaded on exam. Orthopnea:  Sleeps on 3 pillows Paroxysmal noctural dyspnea: Yes Chest pain/pressure:  No Orthostatic lightheadedness:  No Palpitations:  No Lower extremity edema:  Yes, 2-3+ pitting edema to the thighs Presyncope/syncope:  No Cough:  Infrequent but yes  Past Medical History:  Diagnosis Date   Breast cancer (HCC)    Coronary artery disease    Diabetes mellitus    GERD (gastroesophageal reflux disease)     Hypertension    Myocardial abscess    Myocardial infarct (HCC) 06/24/2012   Pulmonary hypertension (HCC)     Current Facility-Administered Medications  Medication Dose Route Frequency Provider Last Rate Last Admin   sodium chloride flush (NS) 0.9 % injection 3 mL  3 mL Intravenous Q12H Sandra Poche, MD       Current Outpatient Medications  Medication Sig Dispense Refill   Ascorbic Acid (VITAMIN C PO) Take 1 tablet by mouth daily.     aspirin EC 81 MG tablet Take 81 mg by mouth daily.     atorvastatin (LIPITOR) 40 MG tablet Take 1 tablet by mouth once daily 90 tablet 0   Cholecalciferol (VITAMIN Schneider) 50 MCG (2000 UT) CAPS Take 2,000 Units by mouth daily.     dorzolamide-timolol (COSOPT) 22.3-6.8 MG/ML ophthalmic solution Place 1 drop into both eyes 2 (two) times daily.     famotidine (PEPCID) 20 MG tablet Take 20 mg by mouth daily.     furosemide (LASIX) 20 MG tablet Take 3 tablets (60 mg total) by mouth 2 (two) times daily. 90 tablet 2   isosorbide-hydrALAZINE (BIDIL) 20-37.5 MG tablet Take 1 tablet by mouth 2 (two) times daily. 60 tablet 2   letrozole (FEMARA) 2.5 MG tablet Take 2.5 mg by mouth daily.     loratadine (CLARITIN) 10 MG tablet Take 10 mg by mouth daily.     metFORMIN (GLUCOPHAGE) 1000 MG tablet Take 0.5 tablets (500 mg total) by mouth 2 (two) times daily with a meal.     metoprolol tartrate (LOPRESSOR) 50 MG tablet  Take 50 mg by mouth 2 (two) times daily.     Multiple Vitamin (MULTIVITAMIN) tablet Take 1 tablet by mouth daily.     potassium chloride (KLOR-CON M) 10 MEQ tablet Take 2 tablets (20 mEq total) by mouth daily. 60 tablet 11   sildenafil (REVATIO) 20 MG tablet Take 1 tablet (20 mg total) by mouth 3 (three) times daily. 90 tablet 3   Facility-Administered Medications Ordered in Other Encounters  Medication Dose Route Frequency Provider Last Rate Last Admin   aspirin EC tablet 81 mg  81 mg Oral Daily Schneider, Sandra D, NP   81 mg at 09/26/22 1341   atorvastatin  (LIPITOR) tablet 40 mg  40 mg Oral Daily Schneider, Sandra D, NP   40 mg at 09/26/22 1341   furosemide (LASIX) injection 80 mg  80 mg Intravenous BID Schneider, Sandra D, NP   80 mg at 09/26/22 1341   heparin ADULT infusion 100 units/mL (25000 units/260mL)  1,250 Units/hr Intravenous Continuous Sandra Schneider, RPH 12.5 mL/hr at 09/26/22 1346 1,250 Units/hr at 09/26/22 1346   metoprolol tartrate (LOPRESSOR) tablet 25 mg  25 mg Oral BID Schneider, Sandra D, NP       sildenafil (REVATIO) tablet 20 mg  20 mg Oral TID Schneider, Sandra D, NP        No Known Allergies    Social History   Socioeconomic History   Marital status: Widowed    Spouse name: Not on file   Number of children: Not on file   Years of education: Not on file   Highest education level: Not on file  Occupational History   Not on file  Tobacco Use   Smoking status: Never    Passive exposure: Never   Smokeless tobacco: Never  Vaping Use   Vaping Use: Never used  Substance and Sexual Activity   Alcohol use: No   Drug use: No   Sexual activity: Yes    Birth control/protection: Post-menopausal  Other Topics Concern   Not on file  Social History Narrative   Not on file   Social Determinants of Health   Financial Resource Strain: Not on file  Food Insecurity: No Food Insecurity (09/08/2022)   Hunger Vital Sign    Worried About Running Out of Food in the Last Year: Never true    Ran Out of Food in the Last Year: Never true  Transportation Needs: No Transportation Needs (09/08/2022)   PRAPARE - Administrator, Civil Service (Medical): No    Lack of Transportation (Non-Medical): No  Physical Activity: Not on file  Stress: Not on file  Social Connections: Not on file  Intimate Partner Violence: Not At Risk (09/08/2022)   Humiliation, Afraid, Rape, and Kick questionnaire    Fear of Current or Ex-Partner: No    Emotionally Abused: No    Physically Abused: No    Sexually Abused: No      Family History  Problem Relation Age  of Onset   Heart disease Father    Diabetes Other    Hypertension Other    Cancer Other    Colon cancer Son 22       alive, doing well.    PHYSICAL EXAM: Vitals:   09/26/22 0922  BP: 130/80  Pulse: 88  SpO2: 93%   GENERAL: Obese African-American female, appears to be in respiratory distress HEENT: Negative for arcus senilis or xanthelasma. There is no scleral icterus.  The mucous membranes are pink and moist.  NECK: Supple, No masses. Normal carotid upstrokes without bruits. No masses or thyromegaly.    CHEST: There are no chest wall deformities. There is no chest wall tenderness. Respirations are unlabored.  Lungs-decreased bilaterally, mild crackles at bases CARDIAC:  JVP: Elevated to mid neck         Normal rate with regular rhythm. No murmurs, rubs or gallops.  Pulses are 2+ and symmetrical in upper and lower extremities.  3+ edema.  ABDOMEN: Abdomen distended EXTREMITIES: Warm to touch with significant pitting edema LYMPHATIC: No axillary or supraclavicular lymphadenopathy.  NEUROLOGIC: Patient is oriented x3 with no focal or lateralizing neurologic deficits.  PSYCH: Patients affect is appropriate, there is no evidence of anxiety or depression.  SKIN: Warm and dry; no lesions or wounds.      DATA REVIEW  ECG:06/13/22: NSR with 1AVB  ECHO: 01/18/22: LVBEF 60-65%. Moderately dilated RV with mild reduction in function. Dilated RA.  CATH: 02/08/23:   Prox Cx to Dist Cx lesion is 25% stenosed.   Mid LAD lesion is 25% stenosed.   2nd Mrg lesion is 100% stenosed.  Right to left collaterals.  Left to left collaterals.   The left ventricular systolic function is normal.   LV end diastolic pressure is mildly elevated.   The left ventricular ejection fraction is greater than 65% by visual estimate.   Hemodynamic findings consistent with severe pulmonary hypertension.   There is no aortic valve stenosis.   Aortic saturation 96% on 3 L nasal cannula, PA saturation 69% on 3 L,  mean RA pressure 32 mm Hg; PA pressure 107/48, mean PA pressure 69 mmHg, pulmonary capillary wedge pressure difficult to assess due to significant V waves, and difficulty wedging.  Cardiac output 7.03 L/min, cardiac index 3.07.   Patent stents with mild restenosis in the LAD and circumflex.  Small obtuse marginal appears occluded distally with some right to left collaterals.  No target for PCI.  PFTS: 03/21/22: - No obstructive lung disease.   ASSESSMENT & PLAN:  Pulmonary hypertension - RHC waveforms reviewed personally. RA 30, RV 107/20-30, PA 107/48 (69), unable to wedge; LVEDP 15. AO 140/64. LVEDP ~16. Assuming a PCWP of 20, TPG would be 49 w/ PVR of 7.  - Hemodynamics consistent with severe pulmonary hypertension largely due to pre-capillary PH.  - Labs: ANA, HCV negative. HIV pending. LFTs mostly unremarkable.  - PFTs w/ moderate restriction.  - BNP previously elevated.  - CT chest w/ pulmonary nodules, however, follow up PET mostly unremarkable.  - Currently on sildenafil 20mg  TID.  -5/14: Will direct admit for IV Lasix.  Previously tried Furoscix but continues to remain severely volume overloaded.  Plan for repeat right heart cath later this week.  2. OSA - followed by pulmonology.  - Severe on recent sleep study; planning on repeat with BIPAP.   3. CAD - LHC as above - followed by general cardiology.   4. Severe obesity - BMI 48.54 today - discussed importance of weight loss.    [facility] Advanced Heart Failure Mechanical Circulatory Support

## 2022-09-26 NOTE — ED Provider Notes (Signed)
I saw and evaluated the patient, reviewed the resident's note and I agree with the findings and plan.    67 year old female presents with increased shortness of breath times several days.  Notes orthopnea as well as dyspnea on exertion.  Does have rales on her exam here.  Seen at heart failure clinic and sent here for admission for diuresis.  Plan will be to check labs and x-rays and admit the patient.   Lorre Nick, MD 09/26/22 1113

## 2022-09-26 NOTE — Progress Notes (Signed)
2019 patient ambulating back from bathroom flopped on bed and went flat side ways not responding at all HR in 50 sternal rub patient not responding started a chest compression patient screamed out "wait no what's going on" [patient reports feeling light headed and dizzy after episode that's why she sat down

## 2022-09-26 NOTE — Progress Notes (Signed)
ANTICOAGULATION CONSULT NOTE - Initial Consult  Pharmacy Consult for heparin Indication:  atrial flutter  No Known Allergies  Patient Measurements: Height: 5\' 5"  (165.1 cm) Weight: 130.6 kg (287 lb 15.8 oz) (From HF clinic) IBW/kg (Calculated) : 57 Heparin Dosing Weight: 89.1 kg   Vital Signs: BP: 124/94 (05/14 1030) Pulse Rate: 84 (05/14 1030)  Labs: No results for input(s): "HGB", "HCT", "PLT", "APTT", "LABPROT", "INR", "HEPARINUNFRC", "HEPRLOWMOCWT", "CREATININE", "CKTOTAL", "CKMB", "TROPONINIHS" in the last 72 hours.  Estimated Creatinine Clearance: 34 mL/min (A) (by C-G formula based on SCr of 2.16 mg/dL (H)).   Medical History: Past Medical History:  Diagnosis Date   Breast cancer (HCC)    Coronary artery disease    Diabetes mellitus    GERD (gastroesophageal reflux disease)    Hypertension    Myocardial abscess    Myocardial infarct (HCC) 06/24/2012   Pulmonary hypertension (HCC)     Medications:  Scheduled:   furosemide  80 mg Intravenous Once   sodium chloride flush  3 mL Intravenous Q12H    Assessment: 67 yof presented with volume overload. Now found in Aflutter - no AC PTA.   Hgb 12.5, plt 250. No s/sx of bleeding.   Goal of Therapy:  Heparin level 0.3-0.7 units/ml Monitor platelets by anticoagulation protocol: Yes   Plan:  Give 4000 units bolus x 1 Start heparin infusion at 1250 units/hr Check anti-Xa level in 6 hours and daily while on heparin Continue to monitor H&H and platelets  Thank you for allowing pharmacy to participate in this patient's care,  Sherron Monday, PharmD, BCCCP Clinical Pharmacist  Phone: [phone] 09/26/2022 12:20 PM  Please check AMION for all [facility] Pharmacy phone numbers After 10:00 PM, call Main Pharmacy [phone]

## 2022-09-26 NOTE — H&P (Signed)
History and Physical    Patient: Sandra Schneider AOZ:[id number] DOB: 1955-06-12 DOA: 09/26/2022 DOS: the patient was seen and examined on 09/26/2022 PCP: [facility]  Patient coming from: Home - lives with sister and son; NOK: Laurence Aly, [phone]   Chief Complaint: SOB  HPI: Sandra Schneider is a 67 y.o. female with medical history significant of breast cancer, CAD s/p stent, DM, stage 3b CKD, chronic diastolic CHF, and HTN who was admitted from 4/25-27 for hypoglycemia and acute on chronic CHF.   She is presenting with edema and SOB. She has noticed several months of worsening edema and DOE.  She went to cardiology today about this issue and was suggested for admission for IV Lasix associated with her significant fluid overload.  She also reports that she was recently diagnosed with OSA and is planning to start CPAP.  She denies CP but has noticed abdominal edema and bloating.    ER Course:  Abdominal edema and SOB after recently increased Lasix to 60mg .  Went to cardiology, sent to ER for admission for diuresis.  Negative CXR.  Troponin 48, creatinine 2.21, BNP 2600.  EKG with new aflutter.  Cardiology consulted.     Review of Systems: As mentioned in the history of present illness. All other systems reviewed and are negative. Past Medical History:  Diagnosis Date   Breast cancer (HCC)    Coronary artery disease    Diabetes mellitus    GERD (gastroesophageal reflux disease)    Hypertension    Myocardial abscess    Myocardial infarct (HCC) 06/24/2012   Pulmonary hypertension (HCC)    Past Surgical History:  Procedure Laterality Date   ANTERIOR VITRECTOMY Left 07/21/2019   Procedure: ANTERIOR VITRECTOMY;  Surgeon: Fabio Pierce, MD;  Location: [facility];  Service: Ophthalmology;  Laterality: Left;   ANTERIOR VITRECTOMY Right 08/04/2019   Procedure: ANTERIOR VITRECTOMY;  Surgeon: Fabio Pierce, MD;  Location: [facility];  Service:  Ophthalmology;  Laterality: Right;   CARDIAC CATHETERIZATION     CATARACT EXTRACTION W/PHACO Left 07/21/2019   Procedure: CATARACT EXTRACTION PHACO AND INTRAOCULAR LENS PLACEMENT (IOC) (CDE: 11.45);  Surgeon: Fabio Pierce, MD;  Location: [facility];  Service: Ophthalmology;  Laterality: Left;   CATARACT EXTRACTION W/PHACO Right 08/04/2019   Procedure: CATARACT EXTRACTION PHACO AND INTRAOCULAR LENS PLACEMENT (IOC);  Surgeon: Fabio Pierce, MD;  Location: [facility];  Service: Ophthalmology;  Laterality: Right;  CDE: 9.78   CESAREAN SECTION     COLONOSCOPY WITH PROPOFOL N/A 05/22/2022   Procedure: COLONOSCOPY WITH PROPOFOL;  Surgeon: Lanelle Bal, DO;  Location: [facility];  Service: Endoscopy;  Laterality: N/A;  10:00 am   CORONARY STENT PLACEMENT N/A 06/24/2012   LEFT HEART CATHETERIZATION WITH CORONARY ANGIOGRAM N/A 06/24/2012   Procedure: LEFT HEART CATHETERIZATION WITH CORONARY ANGIOGRAM;  Surgeon: Dolores Patty, MD;  Location: [facility] C[facility] LAB;  Service: Cardiovascular;  Laterality: N/A;   right breast lumpectomy     RIGHT/LEFT HEART CATH AND CORONARY ANGIOGRAPHY N/A 02/07/2022   Procedure: RIGHT/LEFT HEART CATH AND CORONARY ANGIOGRAPHY;  Surgeon: Corky Crafts, MD;  Location: [facility] INVASIVE CV LAB;  Service: Cardiovascular;  Laterality: N/A;   Social History:  reports that she has never smoked. She has never been exposed to tobacco smoke. She has never used smokeless tobacco. She reports that she does not drink alcohol and does not use drugs.  No Known Allergies  Family History  Problem Relation Age of Onset   Heart disease Father  Diabetes Other    Hypertension Other    Cancer Other    Colon cancer Son 22       alive, doing well.    Prior to Admission medications   Medication Sig Start Date End Date Taking? Authorizing Provider  Ascorbic Acid (VITAMIN C PO) Take 1 tablet by mouth daily.    [provider]  aspirin EC 81 MG tablet Take 81 mg by mouth daily.     [provider]  atorvastatin (LIPITOR) 40 MG tablet Take 1 tablet by mouth once daily 08/02/22   Antoine Poche, MD  Cholecalciferol (VITAMIN D) 50 MCG (2000 UT) CAPS Take 2,000 Units by mouth daily.    [provider]  dorzolamide-timolol (COSOPT) 22.3-6.8 MG/ML ophthalmic solution Place 1 drop into both eyes 2 (two) times daily.    [provider]  famotidine (PEPCID) 20 MG tablet Take 20 mg by mouth daily.    [provider]  furosemide (LASIX) 20 MG tablet Take 3 tablets (60 mg total) by mouth 2 (two) times daily. 09/09/22   Vassie Loll, MD  isosorbide-hydrALAZINE (BIDIL) 20-37.5 MG tablet Take 1 tablet by mouth 2 (two) times daily. 09/09/22   Vassie Loll, MD  letrozole [facility]) 2.5 MG tablet Take 2.5 mg by mouth daily. 01/01/20   [provider]  loratadine (CLARITIN) 10 MG tablet Take 10 mg by mouth daily.    [provider]  metFORMIN (GLUCOPHAGE) 1000 MG tablet Take 0.5 tablets (500 mg total) by mouth 2 (two) times daily with a meal. 09/09/22   Vassie Loll, MD  metoprolol tartrate (LOPRESSOR) 50 MG tablet Take 50 mg by mouth 2 (two) times daily.    [provider]  Multiple Vitamin (MULTIVITAMIN) tablet Take 1 tablet by mouth daily.    [provider]  potassium chloride (KLOR-CON M) 10 MEQ tablet Take 2 tablets (20 mEq total) by mouth daily. 09/06/22   Jacklynn Ganong, FNP  sildenafil (REVATIO) 20 MG tablet Take 1 tablet (20 mg total) by mouth 3 (three) times daily. 06/23/22   Dorthula Nettles, DO    Physical Exam: Vitals:   09/26/22 1330 09/26/22 1500 09/26/22 1715 09/26/22 1810  BP: 120/80  126/84   Pulse: 80  89   Resp: 18 (!) 23 17 18   Temp: 97.9 F (36.6 C)   97.9 F (36.6 C)  TempSrc: Oral   Oral  SpO2: 97%  95%   Weight:    128.7 kg  Height:       General:  Appears calm and comfortable and is in NAD, sedentary Eyes:  EOMI, normal lids, iris ENT:  grossly normal hearing, lips & tongue,  mmm Neck:  no LAD, masses or thyromegaly Cardiovascular:  Irregularly irregular without tachycardia, no m/r/g. B upper leg edema Respiratory:   CTA bilaterally with no wheezes/rales/rhonchi.  Normal respiratory effort on RA. Abdomen:  soft, NT, + abdominal wall edema Skin:  no rash or induration seen on limited exam Musculoskeletal:  grossly normal tone BUE/BLE, good ROM, no bony abnormality Psychiatric:  blunted mood and affect, speech fluent and appropriate, AOx3 Neurologic:  CN 2-12 grossly intact, moves all extremities in coordinated fashion  Radiological Exams on Admission: Independently reviewed - see discussion in A/P where applicable  DG Chest 2 View  Result Date: 09/26/2022 CLINICAL DATA:  Shortness of breath EXAM: CHEST - 2 VIEW COMPARISON:  09/25/2022 and prior radiographs FINDINGS: The cardiomediastinal silhouette is unremarkable. RIGHT UPPER lobe nodule and mild peribronchial thickening  are again identified. There is no evidence of airspace disease, consolidation, mass, pleural effusion or pneumothorax. No acute bony abnormalities are identified. IMPRESSION: 1. No evidence of acute cardiopulmonary disease. 2. RIGHT UPPER lobe nodule, chest CT follow-up recommended as per 03/30/2022 PET CT report. Electronically Signed   By: Harmon Pier M.D.   On: 09/26/2022 12:37   DG Chest 2 View  Result Date: 09/25/2022 CLINICAL DATA:  SOB EXAM: CHEST - 2 VIEW COMPARISON:  09/08/2022 FINDINGS: Cardiomediastinal silhouette and pulmonary vasculature are within normal limits. 9 mm right upper lobe lung nodule is again seen, but better appreciated on prior CT from 03/02/2022. Follow-up chest CT should be performed when patient condition allows as suggested by PET-CT from 03/30/2022. Lungs are otherwise clear. Calcifications adjacent to the right greater tuberosity consistent with calcific rotator cuff tendinosis. IMPRESSION: 1. No acute cardiopulmonary process. 2. 9 mm right upper lobe pulmonary nodule  again seen. Follow-up chest CT should be performed when patient condition allows as suggested by PET-CT from 03/30/2022. Electronically Signed   By: Acquanetta Belling M.D.   On: 09/25/2022 11:56   US Venous Img Lower Bilateral (DVT)  Result Date: 09/25/2022 CLINICAL DATA:  Worsening shortness of breath Edema History of breast cancer EXAM: Bilateral lower Extremity Venous Doppler Ultrasound TECHNIQUE: Gray-scale sonography with compression, as well as color and duplex ultrasound, were performed to evaluate the deep venous system(s) from the level of the common femoral vein through the popliteal and proximal calf veins. COMPARISON:  None available FINDINGS: VENOUS Normal compressibility of the common femoral, superficial femoral, and popliteal veins, as well as the visualized calf veins. Visualized portions of profunda femoral vein and great saphenous vein unremarkable. No filling defects to suggest DVT on grayscale or color Doppler imaging. Doppler waveforms show normal direction of venous flow, normal respiratory plasticity and response to augmentation. OTHER None. Limitations: none IMPRESSION: No  lower extremity DVT. Electronically Signed   By: Acquanetta Belling M.D.   On: 09/25/2022 11:53    EKG: Independently reviewed.  Atrial flutter with rate 88; IVCD with no evidence of acute ischemia   Labs on Admission: I have personally reviewed the available labs and imaging studies at the time of the admission.  Pertinent labs:    Glucose 153 BUN 59/Creatinine 2.2/GFR 24 - stable BNP 2617.1; 2885.7 HS troponin 48 Normal CBC   Assessment and Plan: Principal Problem:   New onset atrial flutter (HCC) Active Problems:   Essential (primary) hypertension   Hyperlipidemia   Morbid (severe) obesity due to excess calories (HCC)   Malignant neoplasm of upper-outer quadrant of right breast in female, estrogen receptor positive (HCC)   Type 2 diabetes mellitus with hyperglycemia (HCC)   Pulmonary hypertension  (HCC)   Solitary pulmonary nodule on lung CT   Acute on chronic diastolic CHF (congestive heart failure) (HCC)   Stage 4 chronic kidney disease (HCC)   OSA on CPAP   Glaucoma (increased eye pressure)    Atrial Fibrillation -Patient presenting with new-onset afib.  -Since the afib onset is unknown, will focus on rate control and searching for the underlying cause at this time. -Will admit to telemetry, as she is rate controlled at this time -HS troponin mildly elevated; will repeat. -Will consult cardiology  -Heart rate is well controlled. -CHA2DS2-VASc Score is >2 and so patient would benefit from oral anticoagulation. There is evidence of net benefit even in the extremely elderly population. -She has been started on heparin for now.  Acute on chronic  diastolic CHF -Patient with known h/o chronic diastolic CHF presenting with worsening edema -Echo on 09/08/22 with moderate pulmonary HTN -CXR without apparent pulmonary edema -Significantly elevated BNP but slightly better than last week -She does appear to have anasarca -Current volume overload may have been mediated by new onset afib -Continue ASA -CHF order set utilized -CHF team is consulting -Will give Lasix 80 mg IV BID -Continue prn [place] O2 for now (not currently needed) -Continue Bidil  HTN -Continue metoprolol -Will also add prn hydralazine  HLD -Continue Lipitor  DM -Last A1c was 5.4, good control -Hold metformin -Will cover with moderate-scale SSI for now  Pulmonary HTN -Continue sildenafil -VQ scan requited by CHF team, will order  Stage 4 CKD -Appears to be stable at this time -Attempt to avoid nephrotoxic medications -Recheck BMP in AM   Lung nodule -Previously recommended to undergo repeat chest CT and she is due for this  Glaucoma -Continue Cosopt  H/o breast cancer -Hold Femara for now  OSA -Continue CPAP  Morbid obesity -Body mass index is 47.22 kg/m..  -Weight loss should be  encouraged -Outpatient PCP/bariatric medicine f/u encouraged    Advance Care Planning:   Code Status: Full Code - Code status was discussed with the patient at the time of admission.  The patient would want to receive full resuscitative measures at this time.   Consults: Cardiology; CHF navigator; TOC team  DVT Prophylaxis: Heparin infusion  Family Communication: None present; she declined to have me consult family at the time of admission  Severity of Illness: The appropriate patient status for this patient is INPATIENT. Inpatient status is judged to be reasonable and necessary in order to provide the required intensity of service to ensure the patient's safety. The patient's presenting symptoms, physical exam findings, and initial radiographic and laboratory data in the context of their chronic comorbidities is felt to place them at high risk for further clinical deterioration. Furthermore, it is not anticipated that the patient will be medically stable for discharge from the hospital within 2 midnights of admission.   * I certify that at the point of admission it is my clinical judgment that the patient will require inpatient hospital care spanning beyond 2 midnights from the point of admission due to high intensity of service, high risk for further deterioration and high frequency of surveillance required.*  Author: Jonah Blue, MD 09/26/2022 6:48 PM  For on call review [email or web address].

## 2022-09-26 NOTE — ED Notes (Signed)
Multiple Ivs attempted by 2 nurses, unable to gain IV access. IV team requested

## 2022-09-27 ENCOUNTER — Inpatient Hospital Stay (HOSPITAL_COMMUNITY): Payer: Medicare Other

## 2022-09-27 DIAGNOSIS — N184 Chronic kidney disease, stage 4 (severe): Secondary | ICD-10-CM | POA: Diagnosis not present

## 2022-09-27 DIAGNOSIS — I5033 Acute on chronic diastolic (congestive) heart failure: Secondary | ICD-10-CM | POA: Diagnosis not present

## 2022-09-27 DIAGNOSIS — I10 Essential (primary) hypertension: Secondary | ICD-10-CM

## 2022-09-27 DIAGNOSIS — I4892 Unspecified atrial flutter: Secondary | ICD-10-CM | POA: Diagnosis not present

## 2022-09-27 DIAGNOSIS — E1165 Type 2 diabetes mellitus with hyperglycemia: Secondary | ICD-10-CM

## 2022-09-27 LAB — CBC
HCT: 37.5 % (ref 36.0–46.0)
Hemoglobin: 12 g/dL (ref 12.0–15.0)
MCH: 31.6 pg (ref 26.0–34.0)
MCHC: 32 g/dL (ref 30.0–36.0)
MCV: 98.7 fL (ref 80.0–100.0)
Platelets: 234 10*3/uL (ref 150–400)
RBC: 3.8 MIL/uL — ABNORMAL LOW (ref 3.87–5.11)
RDW: 15.5 % (ref 11.5–15.5)
WBC: 7.1 10*3/uL (ref 4.0–10.5)
nRBC: 0 % (ref 0.0–0.2)

## 2022-09-27 LAB — BASIC METABOLIC PANEL
Anion gap: 13 (ref 5–15)
BUN: 56 mg/dL — ABNORMAL HIGH (ref 8–23)
CO2: 25 mmol/L (ref 22–32)
Calcium: 9.5 mg/dL (ref 8.9–10.3)
Chloride: 99 mmol/L (ref 98–111)
Creatinine, Ser: 2.07 mg/dL — ABNORMAL HIGH (ref 0.44–1.00)
GFR, Estimated: 26 mL/min — ABNORMAL LOW (ref >60)
Glucose, Bld: 209 mg/dL — ABNORMAL HIGH (ref 70–99)
Potassium: 3.9 mmol/L (ref 3.5–5.1)
Sodium: 137 mmol/L (ref 135–145)

## 2022-09-27 LAB — GLUCOSE, CAPILLARY
Glucose-Capillary: 185 mg/dL — ABNORMAL HIGH (ref 70–99)
Glucose-Capillary: 198 mg/dL — ABNORMAL HIGH (ref 70–99)
Glucose-Capillary: 160 mg/dL — ABNORMAL HIGH (ref 70–99)
Glucose-Capillary: 209 mg/dL — ABNORMAL HIGH (ref 70–99)

## 2022-09-27 LAB — SEDIMENTATION RATE: Sed Rate: 5 mm/hr (ref 0–22)

## 2022-09-27 LAB — CK: Total CK: 97 U/L (ref 38–234)

## 2022-09-27 LAB — HEPARIN LEVEL (UNFRACTIONATED): Heparin Unfractionated: 0.53 IU/mL (ref 0.30–0.70)

## 2022-09-27 LAB — MAGNESIUM: Magnesium: 2.2 mg/dL (ref 1.7–2.4)

## 2022-09-27 LAB — FERRITIN: Ferritin: 92 ng/mL (ref 11–307)

## 2022-09-27 LAB — C-REACTIVE PROTEIN: CRP: 1.9 mg/dL — ABNORMAL HIGH (ref <1.0)

## 2022-09-27 MED ORDER — ASPIRIN 81 MG PO TBEC
81.0000 mg | DELAYED_RELEASE_TABLET | Freq: Every day | ORAL | Status: DC
  Administered 2022-09-29: 81 mg via ORAL
  Filled 2022-09-27: qty 1

## 2022-09-27 MED ORDER — SODIUM CHLORIDE 0.9 % IV SOLN: 250.0000 mL | INTRAVENOUS | Status: DC | PRN

## 2022-09-27 MED ORDER — SODIUM CHLORIDE 0.9% FLUSH
3.0000 mL | Freq: Two times a day (BID) | INTRAVENOUS | Status: DC
  Administered 2022-09-28 – 2022-10-06 (×11): 3 mL via INTRAVENOUS

## 2022-09-27 MED ORDER — LOPERAMIDE HCL 2 MG PO CAPS
2.0000 mg | ORAL_CAPSULE | Freq: Four times a day (QID) | ORAL | Status: DC | PRN
  Administered 2022-09-27: 2 mg via ORAL
  Filled 2022-09-27: qty 1

## 2022-09-27 MED ORDER — POTASSIUM CHLORIDE CRYS ER 20 MEQ PO TBCR
40.0000 meq | EXTENDED_RELEASE_TABLET | Freq: Once | ORAL | Status: AC
  Administered 2022-09-27: 40 meq via ORAL
  Filled 2022-09-27: qty 2

## 2022-09-27 MED ORDER — SODIUM CHLORIDE 0.9 % IV SOLN: INTRAVENOUS | Status: DC

## 2022-09-27 MED ORDER — TECHNETIUM TO 99M ALBUMIN AGGREGATED
4.4000 | Freq: Once | INTRAVENOUS | Status: AC | PRN
  Administered 2022-09-27: 4.4 via INTRAVENOUS

## 2022-09-27 MED ORDER — ASPIRIN 81 MG PO CHEW
81.0000 mg | CHEWABLE_TABLET | ORAL | Status: AC
  Administered 2022-09-28: 81 mg via ORAL
  Filled 2022-09-27: qty 1

## 2022-09-27 MED ORDER — SODIUM CHLORIDE 0.9% FLUSH: 3.0000 mL | INTRAVENOUS | Status: DC | PRN

## 2022-09-27 NOTE — Progress Notes (Addendum)
Advanced Heart Failure Rounding Note  PCP-Cardiologist: Dina Rich, MD   Subjective:   Admitted with marked volume overload and new atrial flutter. Last night had syncopal episode. Heart rate 50. Unresponsive. Had brief CPR.   VQ pending.   I/O not accurate.   Feeling a little better. Having some sob.    Objective:   Weight Range: 129.1 kg Body mass index is 47.36 kg/m.   Vital Signs:   Temp:  [97.6 F (36.4 C)-97.9 F (36.6 C)] 97.6 F (36.4 C) (05/15 0343) Pulse Rate:  [51-110] 76 (05/15 0343) Resp:  [17-23] 18 (05/15 0343) BP: (94-158)/(48-99) 97/48 (05/15 0343) SpO2:  [95 %-100 %] 100 % (05/14 2252) Weight:  [128.7 kg-130.6 kg] 129.1 kg (05/15 0427) Last BM Date : 09/25/22  Weight change: Filed Weights   09/26/22 1030 09/26/22 1810 09/27/22 0427  Weight: 130.6 kg 128.7 kg 129.1 kg    Intake/Output:   Intake/Output Summary (Last 24 hours) at 09/27/2022 1002 Last data filed at 09/27/2022 0600 Gross per 24 hour  Intake 476.77 ml  Output 100 ml  Net 376.77 ml      Physical Exam    General:  In the chair. No resp difficulty HEENT: Normal Neck: Supple. JVP 10-11  . Carotids 2+ bilat; no bruits. No lymphadenopathy or thyromegaly appreciated. Cor: PMI nondisplaced. Regular rate & rhythm. No rubs, gallops or murmurs. Lungs: Clear on 2 liters.  Abdomen: Soft, nontender, nondistended. No hepatosplenomegaly. No bruits or masses. Good bowel sounds. Extremities: No cyanosis, clubbing, rash, R and LLE 1+  edema Neuro: Alert & orientedx3, cranial nerves grossly intact. moves all 4 extremities w/o difficulty. Affect pleasant   Telemetry  A flutter 70s   EKG    N./A  Labs    CBC Recent Labs    09/26/22 1318 09/27/22 0310  WBC 7.0 7.1  HGB 12.5 12.0  HCT 40.0 37.5  MCV 101.3* 98.7  PLT 250 234   Basic Metabolic Panel Recent Labs    16/10/96 1318 09/27/22 0310  NA 137 137  K 4.5 3.9  CL 98 99  CO2 26 25  GLUCOSE 153* 209*  BUN 59*  56*  CREATININE 2.20* 2.07*  CALCIUM 9.8 9.5  MG  --  2.2   Liver Function Tests No results for input(s): "AST", "ALT", "ALKPHOS", "BILITOT", "PROT", "ALBUMIN" in the last 72 hours. No results for input(s): "LIPASE", "AMYLASE" in the last 72 hours. Cardiac Enzymes No results for input(s): "CKTOTAL", "CKMB", "CKMBINDEX", "TROPONINI" in the last 72 hours.  BNP: BNP (last 3 results) Recent Labs    09/06/22 1611 09/20/22 1105 09/26/22 1318  BNP 1,807.9* 2,885.7* 2,617.1*    ProBNP (last 3 results) No results for input(s): "PROBNP" in the last 8760 hours.   D-Dimer No results for input(s): "DDIMER" in the last 72 hours. Hemoglobin A1C No results for input(s): "HGBA1C" in the last 72 hours. Fasting Lipid Panel No results for input(s): "CHOL", "HDL", "LDLCALC", "TRIG", "CHOLHDL", "LDLDIRECT" in the last 72 hours. Thyroid Function Tests Recent Labs    09/26/22 1859  TSH 2.005    Other results:   Imaging    DG Chest 2 View  Result Date: 09/26/2022 CLINICAL DATA:  Shortness of breath EXAM: CHEST - 2 VIEW COMPARISON:  09/25/2022 and prior radiographs FINDINGS: The cardiomediastinal silhouette is unremarkable. RIGHT UPPER lobe nodule and mild peribronchial thickening are again identified. There is no evidence of airspace disease, consolidation, mass, pleural effusion or pneumothorax. No acute bony abnormalities are identified.  IMPRESSION: 1. No evidence of acute cardiopulmonary disease. 2. RIGHT UPPER lobe nodule, chest CT follow-up recommended as per 03/30/2022 PET CT report. Electronically Signed   By: Harmon Pier M.D.   On: 09/26/2022 12:37     Medications:     Scheduled Medications:  aspirin EC  81 mg Oral Daily   atorvastatin  40 mg Oral Daily   dorzolamide-timolol  1 drop Both Eyes BID   famotidine  20 mg Oral Daily   furosemide  80 mg Intravenous BID   insulin aspart  0-15 Units Subcutaneous TID WC   loratadine  10 mg Oral Daily   metoprolol tartrate  25 mg Oral  BID   sildenafil  20 mg Oral TID   sodium chloride flush  3 mL Intravenous Q12H    Infusions:  heparin 1,250 Units/hr (09/27/22 0600)    PRN Medications: acetaminophen **OR** acetaminophen, hydrALAZINE, ondansetron **OR** ondansetron (ZOFRAN) IV    Patient Profile     Sandra Schneider is a 67 year old with a history of CAD (NSTEMI 06/2012 w/ PCI to LAD and Lcx) LVEF 55% at that time, OSA, HTN, hyperlipidemia, hx of breast ca s/p lumpectomy and HFpEF.    Admitting with A/C HFpEF--RV Failure and new onset Atrial Flutter.   Assessment/Plan   1. A/C HFpEF -->RV Failure/Pulmonary HTN RHC (9/23) waveforms reviewed personally. RA 30, RV 107/20-30, PA 107/48 (69), unable to wedge; LVEDP 15. AO 140/64. LVEDP ~16. Assuming a PCWP of 20, TPG would be 49 w/ PVR of 7.  - Hemodynamics consistent with severe pulmonary hypertension largely due to pre-capillary PH.  - Labs: ANA, HCV negative. HIV pending. LFTs mostly unremarkable.  - PFTs w/ moderate restriction.  - CT chest w/ pulmonary nodules, however, follow up PET mostly unremarkable.  - Echo (3/24): RV severely enlarged w/ severely reduced RVSF .  - CMRI 09/2022 - EF 75% RV moderately dilated Mod TR. Possible myocarditis versus sarcoid.  Failed outpatient oral diuretics. Recently torsemide was stopped and she was switched to lasix 60 mg twice a day.  Presented to HF clinic with marked volume overload.  VQ scan completed this morning.  Volume status improving. Continue IV lasix 80 mg twice a day.  Once diuresed will need RHC.  Continue  sildenafil for now.    2. New Atrial Flutter EKG on admit ---> A flutter with controlled rate.  Continue  heparin drip for now. Eventual switch to DOAC.  Continue lopressor 25 mg twice a day   Will need to diurese for now.  If she doesn't convert will need TEE/DC-CV    3. OSA Needs CPAP but apparently she doesn't have the machine yet.    4. CAD No chest pain. Restart statin and aspirin.    5.  DMII    6. HTN  Stop Bidil with PDE5.   7.Syncope Syncopal episode last week sitting on her bed.  Last night she had syncopal episode walking back from the bathroom. No arrhythmias. Suspect r/t RV failure/PH.  8,. CKD Stage IIIb Creatinine baseline  unclear. Recently ~ 2.  No recent NSAIDs.    Length of Stay: 1  Sandra Clegg, Sandra Schneider  09/27/2022, 10:02 AM  Advanced Heart Failure Team Pager [phone] (M-F; 7a - 5p)  Please contact [facility] for night-coverage after hours (5p -7a ) and weekends on [email or web address]  Patient seen with Sandra Schneider, agree with the above note.   She remains in atrial flutter with rate controlled in 70s.  She had a syncopal  episode last night when she got up to go to BR, no arrhythmia noted on telemetry.   General: NAD Neck: JVP 14+ cm, no thyromegaly or thyroid nodule.  Lungs: Clear to auscultation bilaterally with normal respiratory effort. CV: Nondisplaced PMI.  Heart regular S1/S2, no S3/S4, no murmur.  1+ edema to knees. Abdomen: Soft, nontender, no hepatosplenomegaly, moderate distention.  Skin: Intact without lesions or rashes.  Neurologic: Alert and oriented x 3.  Psych: Normal affect. Extremities: No clubbing or cyanosis.  HEENT: Normal.   1. RV failure/pulmonary hypertension:  RHC (9/23) with RA 30, RV 107/20-30, PA 107/48 (69), unable to wedge; LVEDP 15. AO 140/64. LVEDP ~16. Assuming a PCWP of 20, TPG would be 49 w/ PVR of 7.  Echo 3/24 with RV severely enlarged w/ severely reduced RV function.  Cardiac MRI in 5/24 with EF 75%, moderate RV dilation with RV EF 50%, moderate TR, mid-wall basal septal/basal inferolateral LGE.  Could be seen with cardiac sarcoidosis (vs prior myocarditis).  CT chest showed RUL nodule (negative on PET) and mediastinal LAN.   She is volume overloaded on exam.  She has been on sildenafil 20 tid as outpatient. Needs full PH workup => possible group 1 with OSA contributing.  - Lasix 80 mg IV bid.  - Stop Bidil with use of sildenafil.  - Can  continue sildenafil today. - V/Q scan today r/o chronic PEs.  - Send autoimmune serologies.  - Needs eventual cardiac PET to assess for cardiac sarcoidosis.   - Severe OSA, needs CPAP. Will use in hospital.  - Repeat RHC tomorrow. Discussed risks/benefits, she agrees to procedure.  2. Syncope: Suspect this was due to severe PH/RV failure.  No arrhythmias noted on telemetry.  3. Atrial flutter: New diagnosis.  Rate is controlled.   - Continue heparin gtt.  - She will need TEE-DCCV, plan for Friday.  - Metoprolol low dose for now.  4. CAD: Remote PCI LAD and LCx.  - Continue ASA and statin.  5. CKD stage 3: creatinine lower at 2.07.  Watch with diuresis.   Marca Ancona 09/27/2022 2:07 PM

## 2022-09-27 NOTE — TOC Initial Note (Signed)
Transition of Care [facility]) - Initial/Assessment Note    Patient Details  Name: Sandra Schneider MRN: [id number] Date of Birth: [birth year]  Transition of Care [facility]) CM/SW Contact:    Gala Lewandowsky, RN Phone Number: 09/27/2022, 12:54 PM  Clinical Narrative: Patent presented for shortness of breath. Patient reports that she lives with her sister Gene and her son. Patient's brother is at the bedside during the conversation. Patient does not have any DME- she asks about a riser for the toilet. Case Manager explained that the patient can purchase one out of pocket at [employer]. Patient states she had a sleep study @ [facility] two weeks ago and is awaiting CPAP. Case Manager will follow for toc needs as she progresses.                     Expected Discharge Plan: Home w Home Health Services Barriers to Discharge: Continued Medical Work up   Patient Goals and CMS Choice Patient states their goals for this hospitalization and ongoing recovery are:: to return home.    Expected Discharge Plan and Services In-house Referral: NA Discharge Planning Services: CM Consult   Living arrangements for the past 2 months: Single Family Home                   DME Agency: NA  Prior Living Arrangements/Services Living arrangements for the past 2 months: Single Family Home Lives with:: Adult Children, Siblings Patient language and need for interpreter reviewed:: Yes        Need for Family Participation in Patient Care: Yes (Comment) Care giver support system in place?: Yes (comment)   Criminal Activity/Legal Involvement Pertinent to Current Situation/Hospitalization: No - Comment as needed  Activities of Daily Living Home Assistive Devices/Equipment: None ADL Screening (condition at time of admission) Patient's cognitive ability adequate to safely complete daily activities?: Yes Is the patient deaf or have difficulty hearing?: No Does the patient have difficulty seeing,  even when wearing glasses/contacts?: No Does the patient have difficulty concentrating, remembering, or making decisions?: No Patient able to express need for assistance with ADLs?: Yes Does the patient have difficulty dressing or bathing?: No Independently performs ADLs?: Yes (appropriate for developmental age) Does the patient have difficulty walking or climbing stairs?: No Weakness of Legs: None Weakness of Arms/Hands: None  Permission Sought/Granted Permission sought to share information with : Case Manager, Family Supports   Emotional Assessment Appearance:: Appears stated age Attitude/Demeanor/Rapport: Engaged Affect (typically observed): Appropriate Orientation: : Oriented to Self, Oriented to Place, Oriented to Situation, Oriented to  Time Alcohol / Substance Use: Not Applicable Psych Involvement: No (comment)  Admission diagnosis:  New onset atrial flutter (HCC) [I48.92] Patient Active Problem List   Diagnosis Date Noted   New onset atrial flutter (HCC) 09/26/2022   Glaucoma (increased eye pressure) 09/26/2022   OSA on CPAP 09/20/2022   Stage 4 chronic kidney disease (HCC) 09/09/2022   Acute on chronic diastolic CHF (congestive heart failure) (HCC) 09/08/2022   Hypoglycemia 09/07/2022   Pulmonary hypertension (HCC) 06/05/2022   Solitary pulmonary nodule on lung CT 06/05/2022   Rectal mass 05/16/2022   DOE (dyspnea on exertion)    Breast cancer (HCC) 12/12/2019   Pain due to onychomycosis of toenails of both feet 12/12/2019   Pincer nail deformity 12/12/2019   Malignant neoplasm of upper-outer quadrant of right breast in female, estrogen receptor positive (HCC) 11/12/2019   Non-ST elevation (NSTEMI) myocardial infarction (HCC) 06/25/2012   Atherosclerotic heart  disease of native coronary artery without angina pectoris 06/25/2012   Essential (primary) hypertension 06/25/2012   Uncontrolled diabetes mellitus 06/25/2012   Hyperlipidemia 06/25/2012   Normocytic anemia  06/25/2012   Morbid (severe) obesity due to excess calories (HCC) 06/25/2012   Type 2 diabetes mellitus with hyperglycemia (HCC) 06/25/2012   PCP:  Elmer Picker [facility] Pharmacy:   [facility] [address]  - [address] [place] [address] Phone: [phone] Fax: [phone]  Social Determinants of Health (SDOH) Social History: SDOH Screenings   Food Insecurity: No Food Insecurity (09/27/2022)  Housing: Low Risk  (09/27/2022)  Transportation Needs: No Transportation Needs (09/27/2022)  Utilities: Not At Risk (09/27/2022)  Tobacco Use: Low Risk  (09/26/2022)   Readmission Risk Interventions     No data to display

## 2022-09-27 NOTE — Assessment & Plan Note (Signed)
Follow up as outpatient.  

## 2022-09-27 NOTE — Progress Notes (Signed)
Progress Note   Patient: Sandra Schneider ZOX:[id number] DOB: 10-Mar-1956 DOA: 09/26/2022     1 DOS: the patient was seen and examined on 09/27/2022   Brief hospital course: Sandra Schneider was admitted to the hospital with the working diagnosis of heart failure exacerbation.   67 yo female with the past medical history of heart failure, CKD stage 3b, coronary artery disease, breast cancer, T2DM, and hypertension who presented with dyspnea. Patient had several weeks or worsening dyspnea and lower extremity edema. Out patient follow up with cardiology on the day of admission she was found volume overloaded and recommended ER evaluation. On her initial physical examination her blood pressure was 120/80, HR 80, RR 23 and 02 saturation 95%, lungs with no wheezing or rales, heart with S1 and S2 present, irregularly irregular with no gallops, rubs or murmurs, abdomen with no distention, and bilateral lower extremity edema.   Na 137, K 4,5 Cl 98, bicarbonate 26, glucose 153, bun 59, cr 2.2  BNP 2,671  High sensitive troponin 48 and 60  Wbc 7,0 hgb 12,5 plt 250   Chest radiograph with cardiomegaly, bilateral hilar vascular congestion, bilateral interstitial infiltrates. Right upper lobe nodule.   EKG 80 bpm, normal axis, normal intervals, atrial flutter (typical) 3:1 conduction, with no significant ST segment changes, negative T wave lead II, III, AvF, V4 to V6.   05/14 bradycardic episode, 50 bpm, with decreased responsiveness. Improved with chest compression. (No cardiac arrest).    Assessment and Plan: * New onset atrial flutter (HCC) Rate controlled atrial flutter.  Anticoagulation with heparin drip until cardiac work up completed.   Acute on chronic diastolic CHF (congestive heart failure) (HCC) Echocardiogram with preserved LV systolic function EF >75%, interventricular septum is flattened in systole and diastole. RV with severe enlargement, RV systolic function with moderate reduction,  RVSP 45.9 mmHg. TR mild to moderate.   Acute on chronic core pulmonale.  Pulmonary hypertension (type 4 or 5).   V/Q scan with low probability for pulmonary embolism.   Documented urine output is 100 cc Systolic blood pressure 97 to 811 mmHg.   Plan to continue diuresis with furosemide 80 mg IV q12 hrs.  Metoprolol.  Sildenafil for pulmonary hypertension.    Stage 4 chronic kidney disease (HCC) AKI,  Renal function with serum cr at 2,0 with K at 3,9 and serum bicarbonate at 25.  Na 137  Plan to continue diuresis with furosemide.  Follow up renal function and electrolytes in am.   Essential (primary) hypertension Continue blood pressure monitoring Continue with metoprolol.   Type 2 diabetes mellitus with hyperglycemia (HCC) Uncontrolled T2Dm with hyperglycemia.   Continue glucose cover and monitoring with insulin sliding scale.    Malignant neoplasm of upper-outer quadrant of right breast in female, estrogen receptor positive (HCC) Follow up as outpatient.   Class 3 obesity (HCC) Calculated BMI is 47,3   Solitary pulmonary nodule on lung CT Follow up as outpatient.         Subjective: Patient with improvement in dyspnea and edema, not back to her baseline yet.   Physical Exam: Vitals:   09/27/22 0343 09/27/22 0427 09/27/22 1453 09/27/22 1454  BP: (!) 97/48   112/78  Pulse: 76   60  Resp: 18   18  Temp: 97.6 F (36.4 C)   97.6 F (36.4 C)  TempSrc: Axillary   Oral  SpO2:    98%  Weight:  129.1 kg 129.1 kg   Height:  Neurology awake and alert ENT with mild pallor Cardiovascular with S1 and S2 present and regular with no gallops, rubs or murmurs Positive JVD Positive lower extremity edema Respiratory with no wheezing or rales, no rhonchi Abdomen with no distention  Data Reviewed:    Family Communication: no family at the bedside   Disposition: Status is: Inpatient Remains inpatient appropriate because: heart failure   Planned Discharge  Destination: Home    Author: Coralie Keens, MD 09/27/2022 4:07 PM  For on call review [email or web address].

## 2022-09-27 NOTE — H&P (View-Only) (Signed)
  Advanced Heart Failure Rounding Note  PCP-Cardiologist: Branch, Jonathan, MD   Subjective:   Admitted with marked volume overload and new atrial flutter. Last night had syncopal episode. Heart rate 50. Unresponsive. Had brief CPR.   VQ pending.   I/O not accurate.   Feeling a little better. Having some sob.    Objective:   Weight Range: 129.1 kg Body mass index is 47.36 kg/m.   Vital Signs:   Temp:  [97.6 F (36.4 C)-97.9 F (36.6 C)] 97.6 F (36.4 C) (05/15 0343) Pulse Rate:  [51-110] 76 (05/15 0343) Resp:  [17-23] 18 (05/15 0343) BP: (94-158)/(48-99) 97/48 (05/15 0343) SpO2:  [95 %-100 %] 100 % (05/14 2252) Weight:  [128.7 kg-130.6 kg] 129.1 kg (05/15 0427) Last BM Date : 09/25/22  Weight change: Filed Weights   09/26/22 1030 09/26/22 1810 09/27/22 0427  Weight: 130.6 kg 128.7 kg 129.1 kg    Intake/Output:   Intake/Output Summary (Last 24 hours) at 09/27/2022 1002 Last data filed at 09/27/2022 0600 Gross per 24 hour  Intake 476.77 ml  Output 100 ml  Net 376.77 ml      Physical Exam    General:  In the chair. No resp difficulty HEENT: Normal Neck: Supple. JVP 10-11  . Carotids 2+ bilat; no bruits. No lymphadenopathy or thyromegaly appreciated. Cor: PMI nondisplaced. Regular rate & rhythm. No rubs, gallops or murmurs. Lungs: Clear on 2 liters.  Abdomen: Soft, nontender, nondistended. No hepatosplenomegaly. No bruits or masses. Good bowel sounds. Extremities: No cyanosis, clubbing, rash, R and LLE 1+  edema Neuro: Alert & orientedx3, cranial nerves grossly intact. moves all 4 extremities w/o difficulty. Affect pleasant   Telemetry  A flutter 70s   EKG    N./A  Labs    CBC Recent Labs    09/26/22 1318 09/27/22 0310  WBC 7.0 7.1  HGB 12.5 12.0  HCT 40.0 37.5  MCV 101.3* 98.7  PLT 250 234   Basic Metabolic Panel Recent Labs    09/26/22 1318 09/27/22 0310  NA 137 137  K 4.5 3.9  CL 98 99  CO2 26 25  GLUCOSE 153* 209*  BUN 59*  56*  CREATININE 2.20* 2.07*  CALCIUM 9.8 9.5  MG  --  2.2   Liver Function Tests No results for input(s): "AST", "ALT", "ALKPHOS", "BILITOT", "PROT", "ALBUMIN" in the last 72 hours. No results for input(s): "LIPASE", "AMYLASE" in the last 72 hours. Cardiac Enzymes No results for input(s): "CKTOTAL", "CKMB", "CKMBINDEX", "TROPONINI" in the last 72 hours.  BNP: BNP (last 3 results) Recent Labs    09/06/22 1611 09/20/22 1105 09/26/22 1318  BNP 1,807.9* 2,885.7* 2,617.1*    ProBNP (last 3 results) No results for input(s): "PROBNP" in the last 8760 hours.   D-Dimer No results for input(s): "DDIMER" in the last 72 hours. Hemoglobin A1C No results for input(s): "HGBA1C" in the last 72 hours. Fasting Lipid Panel No results for input(s): "CHOL", "HDL", "LDLCALC", "TRIG", "CHOLHDL", "LDLDIRECT" in the last 72 hours. Thyroid Function Tests Recent Labs    09/26/22 1859  TSH 2.005    Other results:   Imaging    DG Chest 2 View  Result Date: 09/26/2022 CLINICAL DATA:  Shortness of breath EXAM: CHEST - 2 VIEW COMPARISON:  09/25/2022 and prior radiographs FINDINGS: The cardiomediastinal silhouette is unremarkable. RIGHT UPPER lobe nodule and mild peribronchial thickening are again identified. There is no evidence of airspace disease, consolidation, mass, pleural effusion or pneumothorax. No acute bony abnormalities are identified.   IMPRESSION: 1. No evidence of acute cardiopulmonary disease. 2. RIGHT UPPER lobe nodule, chest CT follow-up recommended as per 03/30/2022 PET CT report. Electronically Signed   By: Jeffrey  Hu M.D.   On: 09/26/2022 12:37     Medications:     Scheduled Medications:  aspirin EC  81 mg Oral Daily   atorvastatin  40 mg Oral Daily   dorzolamide-timolol  1 drop Both Eyes BID   famotidine  20 mg Oral Daily   furosemide  80 mg Intravenous BID   insulin aspart  0-15 Units Subcutaneous TID WC   loratadine  10 mg Oral Daily   metoprolol tartrate  25 mg Oral  BID   sildenafil  20 mg Oral TID   sodium chloride flush  3 mL Intravenous Q12H    Infusions:  heparin 1,250 Units/hr (09/27/22 0600)    PRN Medications: acetaminophen **OR** acetaminophen, hydrALAZINE, ondansetron **OR** ondansetron (ZOFRAN) IV    Patient Profile     Ms Erck is a 67 year old with a history of CAD (NSTEMI 06/2012 w/ PCI to LAD and Lcx) LVEF 55% at that time, OSA, HTN, hyperlipidemia, hx of breast ca s/p lumpectomy and HFpEF.    Admitting with A/C HFpEF--RV Failure and new onset Atrial Flutter.   Assessment/Plan   1. A/C HFpEF -->RV Failure/Pulmonary HTN RHC (9/23) waveforms reviewed personally. RA 30, RV 107/20-30, PA 107/48 (69), unable to wedge; LVEDP 15. AO 140/64. LVEDP ~16. Assuming a PCWP of 20, TPG would be 49 w/ PVR of 7.  - Hemodynamics consistent with severe pulmonary hypertension largely due to pre-capillary PH.  - Labs: ANA, HCV negative. HIV pending. LFTs mostly unremarkable.  - PFTs w/ moderate restriction.  - CT chest w/ pulmonary nodules, however, follow up PET mostly unremarkable.  - Echo (3/24): RV severely enlarged w/ severely reduced RVSF .  - CMRI 09/2022 - EF 75% RV moderately dilated Mod TR. Possible myocarditis versus sarcoid.  Failed outpatient oral diuretics. Recently torsemide was stopped and she was switched to lasix 60 mg twice a day.  Presented to HF clinic with marked volume overload.  VQ scan completed this morning.  Volume status improving. Continue IV lasix 80 mg twice a day.  Once diuresed will need RHC.  Continue  sildenafil for now.    2. New Atrial Flutter EKG on admit ---> A flutter with controlled rate.  Continue  heparin drip for now. Eventual switch to DOAC.  Continue lopressor 25 mg twice a day   Will need to diurese for now.  If she doesn't convert will need TEE/DC-CV    3. OSA Needs CPAP but apparently she doesn't have the machine yet.    4. CAD No chest pain. Restart statin and aspirin.    5.  DMII    6. HTN  Stop Bidil with PDE5.   7.Syncope Syncopal episode last week sitting on her bed.  Last night she had syncopal episode walking back from the bathroom. No arrhythmias. Suspect r/t RV failure/PH.  8,. CKD Stage IIIb Creatinine baseline  unclear. Recently ~ 2.  No recent NSAIDs.    Length of Stay: 1  Amy Clegg, NP  09/27/2022, 10:02 AM  Advanced Heart Failure Team Pager [phone] (M-F; 7a - 5p)  Please contact [facility] for night-coverage after hours (5p -7a ) and weekends on [email or web address]  Patient seen with NP, agree with the above note.   She remains in atrial flutter with rate controlled in 70s.  She had a syncopal   episode last night when she got up to go to BR, no arrhythmia noted on telemetry.   General: NAD Neck: JVP 14+ cm, no thyromegaly or thyroid nodule.  Lungs: Clear to auscultation bilaterally with normal respiratory effort. CV: Nondisplaced PMI.  Heart regular S1/S2, no S3/S4, no murmur.  1+ edema to knees. Abdomen: Soft, nontender, no hepatosplenomegaly, moderate distention.  Skin: Intact without lesions or rashes.  Neurologic: Alert and oriented x 3.  Psych: Normal affect. Extremities: No clubbing or cyanosis.  HEENT: Normal.   1. RV failure/pulmonary hypertension:  RHC (9/23) with RA 30, RV 107/20-30, PA 107/48 (69), unable to wedge; LVEDP 15. AO 140/64. LVEDP ~16. Assuming a PCWP of 20, TPG would be 49 w/ PVR of 7.  Echo 3/24 with RV severely enlarged w/ severely reduced RV function.  Cardiac MRI in 5/24 with EF 75%, moderate RV dilation with RV EF 50%, moderate TR, mid-wall basal septal/basal inferolateral LGE.  Could be seen with cardiac sarcoidosis (vs prior myocarditis).  CT chest showed RUL nodule (negative on PET) and mediastinal LAN.   She is volume overloaded on exam.  She has been on sildenafil 20 tid as outpatient. Needs full PH workup => possible group 1 with OSA contributing.  - Lasix 80 mg IV bid.  - Stop Bidil with use of sildenafil.  - Can  continue sildenafil today. - V/Q scan today r/o chronic PEs.  - Send autoimmune serologies.  - Needs eventual cardiac PET to assess for cardiac sarcoidosis.   - Severe OSA, needs CPAP. Will use in hospital.  - Repeat RHC tomorrow. Discussed risks/benefits, she agrees to procedure.  2. Syncope: Suspect this was due to severe PH/RV failure.  No arrhythmias noted on telemetry.  3. Atrial flutter: New diagnosis.  Rate is controlled.   - Continue heparin gtt.  - She will need TEE-DCCV, plan for Friday.  - Metoprolol low dose for now.  4. CAD: Remote PCI LAD and LCx.  - Continue ASA and statin.  5. CKD stage 3: creatinine lower at 2.07.  Watch with diuresis.   Leightyn Cina 09/27/2022 2:07 PM  

## 2022-09-27 NOTE — Assessment & Plan Note (Addendum)
Calculated BMI is 47,3   Restless legs syndrome, will do a trial or ropinarole.

## 2022-09-27 NOTE — Plan of Care (Signed)
  Problem: Education: Goal: Ability to demonstrate management of disease process will improve Outcome: Not Progressing Goal: Ability to verbalize understanding of medication therapies will improve Outcome: Not Progressing Goal: Individualized Educational Video(s) Outcome: Not Progressing   Problem: Activity: Goal: Capacity to carry out activities will improve Outcome: Progressing   Problem: Cardiac: Goal: Ability to achieve and maintain adequate cardiopulmonary perfusion will improve Outcome: Not Progressing   Problem: Education: Goal: Knowledge of disease or condition will improve Outcome: Not Progressing Goal: Understanding of medication regimen will improve Outcome: Not Progressing Goal: Individualized Educational Video(s) Outcome: Not Progressing   Problem: Activity: Goal: Ability to tolerate increased activity will improve Outcome: Progressing   Problem: Cardiac: Goal: Ability to achieve and maintain adequate cardiopulmonary perfusion will improve Outcome: Not Progressing   Problem: Health Behavior/Discharge Planning: Goal: Ability to safely manage health-related needs after discharge will improve Outcome: Not Progressing   Problem: Education: Goal: Knowledge of General Education information will improve Description: Including pain rating scale, medication(s)/side effects and non-pharmacologic comfort measures Outcome: Not Progressing   Problem: Health Behavior/Discharge Planning: Goal: Ability to manage health-related needs will improve Outcome: Not Progressing   Problem: Clinical Measurements: Goal: Ability to maintain clinical measurements within normal limits will improve Outcome: Not Progressing Goal: Will remain free from infection Outcome: Not Progressing Goal: Diagnostic test results will improve Outcome: Not Progressing Goal: Respiratory complications will improve Outcome: Not Progressing Goal: Cardiovascular complication will be avoided Outcome:  Not Progressing   Problem: Activity: Goal: Risk for activity intolerance will decrease Outcome: Progressing   Problem: Nutrition: Goal: Adequate nutrition will be maintained Outcome: Progressing   Problem: Coping: Goal: Level of anxiety will decrease Outcome: Not Progressing   Problem: Elimination: Goal: Will not experience complications related to bowel motility Outcome: Progressing Goal: Will not experience complications related to urinary retention Outcome: Progressing   Problem: Pain Managment: Goal: General experience of comfort will improve Outcome: Progressing   Problem: Safety: Goal: Ability to remain free from injury will improve Outcome: Not Progressing   Problem: Skin Integrity: Goal: Risk for impaired skin integrity will decrease Outcome: Progressing

## 2022-09-27 NOTE — Consult Note (Addendum)
[facility] Customer service manager [facility]) Accountable Care Organization (ACO) [facility] Liaison Note  09/27/2022  Sandra Schneider [facility] [birth year] [id number]  Location: [facility] RN Hospital Liaison met patient at bedside at [facility].  Insurance: [employer] Sandra Schneider is a 67 y.o. female who is a Primary Care Patient of Dr. Thomasena Edis with [facility]. The patient was screened for 30 days readmission hospitalization with noted medium risk score for unplanned readmission risk with 2 IP in 6 months.  The patient was assessed for potential [facility]) Care Management service needs for post hospital transition for care coordination. Review of patient's electronic medical record reveals patient is with a non [facility] networking provider. [facility] liaison met with pt at bedside and confirmed the provider as noted above. No other needs presented at this time.  [facility] Care Management/Population Health does not replace or interfere with any arrangements made by the Inpatient Transition of Care team.   For questions contact:   Elliot Cousin, RN, BSN [facility] Liaison [place]   [facility]  Population Health Office Hours MTWF 8:00 am to 6 pm off on Thursday [phone] mobile [phone] [Office toll free line][facility] Office Hours are M-F 8:30 - 5 pm 24 hour nurse advise line [phone] Conceirge  [email or web address]

## 2022-09-27 NOTE — Assessment & Plan Note (Addendum)
AKI, hyponatremia.   Renal function worsening per serum cr, today at 2,57 with K at 3,9 and serum bicarbonate at 31. Na 131 and Mg 2.1   Diuretic therapy is on hold  Continue close renal function monitoring.

## 2022-09-27 NOTE — Assessment & Plan Note (Addendum)
Rate controlled atrial flutter -fibrillation.  05/18 failed direct current cardioversion. (TEE).  05/24 direct current cardioversion.   Patient continue on sinus rhythm.  Plan to continue amiodarone, transition to po today.  Continue telemetry monitoring.

## 2022-09-27 NOTE — Assessment & Plan Note (Addendum)
Blood pressure has been stable.  

## 2022-09-27 NOTE — Hospital Course (Addendum)
Sandra Schneider was admitted to the hospital with the working diagnosis of heart failure exacerbation.   67 yo female with the past medical history of heart failure, CKD stage 3b, coronary artery disease, breast cancer, T2DM, and hypertension who presented with dyspnea. Patient had several weeks or worsening dyspnea and lower extremity edema. Out patient follow up with cardiology on the day of admission she was found volume overloaded and recommended ER evaluation. On her initial physical examination her blood pressure was 120/80, HR 80, RR 23 and 02 saturation 95%, lungs with no wheezing or rales, heart with S1 and S2 present, irregularly irregular with no gallops, rubs or murmurs, abdomen with no distention, and bilateral lower extremity edema.   Na 137, K 4,5 Cl 98, bicarbonate 26, glucose 153, bun 59, cr 2.2  BNP 2,671  High sensitive troponin 48 and 60  Wbc 7,0 hgb 12,5 plt 250   Chest radiograph with cardiomegaly, bilateral hilar vascular congestion, bilateral interstitial infiltrates. Right upper lobe nodule.   EKG 80 bpm, normal axis, normal intervals, atrial flutter (typical) 3:1 conduction, with no significant ST segment changes, negative T wave lead II, III, AvF, V4 to V6.   05/14 bradycardic episode, 50 bpm, with decreased responsiveness/ syncope. Improved with chest compression. (No cardiac arrest).  05/16 right heart catheterization with severe pulmonary hypertension.  05/17 TEE cardioversion.  05/18 patient back in atrial flutter, she has been placed on milrinone for RV failure with low output.  05/19 continue atrial flutter.  05/20 atrial fibrillation on telemetry monitor.  05/21 continue diuresing, will likely need repeat direct current cardioversion when more euvolemic.  05/22 continue diuresis with furosemide and inotropic support with milrinone. She fell out of the chair yesterday with no head trauma. No syncope.  05/23 milrinone infusion has been discontinued today.  05/24  renal function is not yet stable per serum cr.  Transitioned from IV amiodarone to po formulation.

## 2022-09-27 NOTE — Progress Notes (Signed)
Heart Failure Navigator Progress Note  Assessed for Heart & Vascular TOC clinic readiness.  Patient does not meet criteria due to Advanced Heart Failure Team consult with Dr. Gasper Lloyd. .   Navigator will sign off at this time.    Rhae Hammock, BSN, Scientist, clinical (histocompatibility and immunogenetics) Only

## 2022-09-27 NOTE — Progress Notes (Signed)
ANTICOAGULATION CONSULT NOTE  Pharmacy Consult for heparin Indication:  atrial flutter  No Known Allergies  Patient Measurements: Height: 5\' 5"  (165.1 cm) Weight: 129.1 kg (284 lb 9.6 oz) IBW/kg (Calculated) : 57 Heparin Dosing Weight: 89.1 kg   Vital Signs: Temp: 97.6 F (36.4 C) (05/15 0343) Temp Source: Axillary (05/15 0343) BP: 97/48 (05/15 0343) Pulse Rate: 76 (05/15 0343)  Labs: Recent Labs    09/26/22 1318 09/26/22 1859 09/26/22 2034 09/27/22 0310  HGB 12.5  --   --  12.0  HCT 40.0  --   --  37.5  PLT 250  --   --  234  HEPARINUNFRC  --   --  0.42 0.53  CREATININE 2.20*  --   --  2.07*  TROPONINIHS 48* 60*  --   --      Estimated Creatinine Clearance: 35.2 mL/min (A) (by C-G formula based on SCr of 2.07 mg/dL (H)).   Assessment: 20 yof presented with volume overload. Now found in Aflutter - no AC PTA.   Heparin level is therapeutic at 0.53, on heparin 1250 units/hr. Hgb 12, plt 234. No s/sx of bleeding or infusion issues.  Goal of Therapy:  Heparin level 0.3-0.7 units/ml Monitor platelets by anticoagulation protocol: Yes   Plan:  Continue heparin drip at 1250 units/hr Daily heparin level, CBC Monitor for signs/symptoms of bleeding  Thank you for allowing pharmacy to participate in this patient's care,  Sherron Monday, PharmD, BCCCP Clinical Pharmacist  Phone: [phone] 09/27/2022 9:58 AM  Please check AMION for all [facility] Pharmacy phone numbers After 10:00 PM, call Main Pharmacy [phone]

## 2022-09-27 NOTE — Progress Notes (Signed)
Pt refusing cpap for the night. ?

## 2022-09-27 NOTE — Assessment & Plan Note (Addendum)
Echocardiogram with preserved LV systolic function EF >75%, interventricular septum is flattened in systole and diastole. RV with severe enlargement, RV systolic function with moderate reduction, RVSP 45.9 mmHg. TR mild to moderate.   Acute on chronic core pulmonale.  Pulmonary hypertension (probably type 5 or type 1).  RV failure   V/Q scan with low probability for pulmonary embolism.   05/16 cardiac catheterization  PA 93/44 mean 66 PCWP mean 19 Cardiac output 3,9 and index 1,73 PVR 11,8   Mainly precapillary pulmonary hypertension, with mild left heart failure.   SV02 65,2  Urine output 800  ml Systolic blood pressure 145 to 149 mmHg.   SP sequential nephron blockade with:  Sp Acetazolamide.  Furosemide drip 15 mg per hr discontinued.  Metolazone on 05/16, 05/17,05/18, 05/20, 05/21/ 05/22.   Now off milrinone, limited pharmacologic options due to low GFR.   Sildenafil for pulmonary hypertension.

## 2022-09-27 NOTE — Assessment & Plan Note (Addendum)
Uncontrolled T2Dm with hyperglycemia.  Fasting glucose today is 143 mg/dl. Continue glucose cover and monitoring with insulin sliding scale.  Basal insulin to 10 units.

## 2022-09-27 NOTE — Progress Notes (Signed)
   RHC in am by Dr Shirlee Latch for HF/PAH.    Set up TEE DC-CV for Friday.    Chima Astorino NP-C  1:53 PM

## 2022-09-28 ENCOUNTER — Other Ambulatory Visit: Payer: Self-pay

## 2022-09-28 ENCOUNTER — Encounter (HOSPITAL_COMMUNITY)
Admission: EM | Disposition: A | Discharge status: Home / Self Care | Payer: Self-pay | Source: Home / Self Care | Attending: Internal Medicine

## 2022-09-28 ENCOUNTER — Inpatient Hospital Stay (HOSPITAL_COMMUNITY): Payer: Medicare Other

## 2022-09-28 DIAGNOSIS — I10 Essential (primary) hypertension: Secondary | ICD-10-CM | POA: Diagnosis not present

## 2022-09-28 DIAGNOSIS — N184 Chronic kidney disease, stage 4 (severe): Secondary | ICD-10-CM | POA: Diagnosis not present

## 2022-09-28 DIAGNOSIS — C50411 Malignant neoplasm of upper-outer quadrant of right female breast: Secondary | ICD-10-CM

## 2022-09-28 DIAGNOSIS — I50810 Right heart failure, unspecified: Secondary | ICD-10-CM

## 2022-09-28 DIAGNOSIS — I5033 Acute on chronic diastolic (congestive) heart failure: Secondary | ICD-10-CM | POA: Diagnosis not present

## 2022-09-28 DIAGNOSIS — I4892 Unspecified atrial flutter: Secondary | ICD-10-CM | POA: Diagnosis not present

## 2022-09-28 DIAGNOSIS — Z170 Estrogen receptor positive status [ER+]: Secondary | ICD-10-CM

## 2022-09-28 HISTORY — PX: RIGHT HEART CATH: CATH118263

## 2022-09-28 LAB — CENTROMERE ANTIBODIES: Centromere Ab Screen: <0.2 AI (ref 0.0–0.9)

## 2022-09-28 LAB — HEPARIN LEVEL (UNFRACTIONATED): Heparin Unfractionated: 0.63 IU/mL (ref 0.30–0.70)

## 2022-09-28 LAB — CBC
HCT: 38.4 % (ref 36.0–46.0)
Hemoglobin: 11.9 g/dL — ABNORMAL LOW (ref 12.0–15.0)
MCH: 31.6 pg (ref 26.0–34.0)
MCHC: 31 g/dL (ref 30.0–36.0)
MCV: 102.1 fL — ABNORMAL HIGH (ref 80.0–100.0)
Platelets: 203 10*3/uL (ref 150–400)
RBC: 3.76 MIL/uL — ABNORMAL LOW (ref 3.87–5.11)
RDW: 15.6 % — ABNORMAL HIGH (ref 11.5–15.5)
WBC: 6.2 10*3/uL (ref 4.0–10.5)
nRBC: 0 % (ref 0.0–0.2)

## 2022-09-28 LAB — ANTI-SCLERODERMA ANTIBODY: Scleroderma (Scl-70) (ENA) Antibody, IgG: <0.2 AI (ref 0.0–0.9)

## 2022-09-28 LAB — BASIC METABOLIC PANEL
Anion gap: 13 (ref 5–15)
BUN: 54 mg/dL — ABNORMAL HIGH (ref 8–23)
CO2: 25 mmol/L (ref 22–32)
Calcium: 9.6 mg/dL (ref 8.9–10.3)
Chloride: 98 mmol/L (ref 98–111)
Creatinine, Ser: 1.91 mg/dL — ABNORMAL HIGH (ref 0.44–1.00)
GFR, Estimated: 28 mL/min — ABNORMAL LOW (ref >60)
Glucose, Bld: 148 mg/dL — ABNORMAL HIGH (ref 70–99)
Potassium: 3.7 mmol/L (ref 3.5–5.1)
Sodium: 136 mmol/L (ref 135–145)

## 2022-09-28 LAB — POCT I-STAT EG7
Acid-Base Excess: 2 mmol/L (ref 0.0–2.0)
Acid-Base Excess: 3 mmol/L — ABNORMAL HIGH (ref 0.0–2.0)
Bicarbonate: 28.6 mmol/L — ABNORMAL HIGH (ref 20.0–28.0)
Bicarbonate: 28.6 mmol/L — ABNORMAL HIGH (ref 20.0–28.0)
Calcium, Ion: 1.17 mmol/L (ref 1.15–1.40)
Calcium, Ion: 1.24 mmol/L (ref 1.15–1.40)
HCT: 39 % (ref 36.0–46.0)
HCT: 40 % (ref 36.0–46.0)
Hemoglobin: 13.3 g/dL (ref 12.0–15.0)
Hemoglobin: 13.6 g/dL (ref 12.0–15.0)
O2 Saturation: 49 %
O2 Saturation: 49 %
Potassium: 4.1 mmol/L (ref 3.5–5.1)
Potassium: 4.3 mmol/L (ref 3.5–5.1)
Sodium: 139 mmol/L (ref 135–145)
Sodium: 140 mmol/L (ref 135–145)
TCO2: 30 mmol/L (ref 22–32)
TCO2: 30 mmol/L (ref 22–32)
pCO2, Ven: 48.7 mmHg (ref 44–60)
pCO2, Ven: 49 mmHg (ref 44–60)
pH, Ven: 7.374 (ref 7.25–7.43)
pH, Ven: 7.376 (ref 7.25–7.43)
pO2, Ven: 27 mmHg — CL (ref 32–45)
pO2, Ven: 27 mmHg — CL (ref 32–45)

## 2022-09-28 LAB — ANCA PROFILE
Anti-MPO Antibodies: <0.2 units (ref 0.0–0.9)
Anti-PR3 Antibodies: <0.2 units (ref 0.0–0.9)
Atypical P-ANCA titer: <1:20 {titer}
C-ANCA: <1:20 {titer}
P-ANCA: <1:20 {titer}

## 2022-09-28 LAB — MAGNESIUM: Magnesium: 2.2 mg/dL (ref 1.7–2.4)

## 2022-09-28 LAB — GLUCOSE, CAPILLARY
Glucose-Capillary: 126 mg/dL — ABNORMAL HIGH (ref 70–99)
Glucose-Capillary: 122 mg/dL — ABNORMAL HIGH (ref 70–99)
Glucose-Capillary: 132 mg/dL — ABNORMAL HIGH (ref 70–99)

## 2022-09-28 LAB — CYCLIC CITRUL PEPTIDE ANTIBODY, IGG/IGA: CCP Antibodies IgG/IgA: 10 units (ref 0–19)

## 2022-09-28 LAB — ALDOLASE: Aldolase: 11.4 U/L — ABNORMAL HIGH (ref 3.3–10.3)

## 2022-09-28 SURGERY — RIGHT HEART CATH
Anesthesia: LOCAL

## 2022-09-28 MED ORDER — FUROSEMIDE 10 MG/ML IJ SOLN: 12.0000 mg/h | INTRAVENOUS | Status: DC

## 2022-09-28 MED ORDER — SODIUM CHLORIDE 0.9% FLUSH: 10.0000 mL | INTRAVENOUS | Status: DC | PRN

## 2022-09-28 MED ORDER — LIDOCAINE HCL (PF) 1 % IJ SOLN
INTRAMUSCULAR | Status: AC
  Filled 2022-09-28: qty 30

## 2022-09-28 MED ORDER — MILRINONE LACTATE IN DEXTROSE 20-5 MG/100ML-% IV SOLN
0.2500 ug/kg/min | INTRAVENOUS | Status: DC
  Administered 2022-09-28 – 2022-09-29 (×2): 0.125 ug/kg/min via INTRAVENOUS
  Administered 2022-10-01 – 2022-10-03 (×7): 0.25 ug/kg/min via INTRAVENOUS
  Filled 2022-09-28 (×10): qty 100

## 2022-09-28 MED ORDER — FUROSEMIDE 10 MG/ML IJ SOLN
INTRAMUSCULAR | Status: AC
  Filled 2022-09-28: qty 8

## 2022-09-28 MED ORDER — LIDOCAINE HCL (PF) 1 % IJ SOLN
INTRAMUSCULAR | Status: DC | PRN
  Administered 2022-09-28 (×2): 2 mL

## 2022-09-28 MED ORDER — ORAL CARE MOUTH RINSE: 15.0000 mL | OROMUCOSAL | Status: DC | PRN

## 2022-09-28 MED ORDER — SODIUM CHLORIDE 0.9% FLUSH
10.0000 mL | Freq: Two times a day (BID) | INTRAVENOUS | Status: DC
  Administered 2022-09-30 – 2022-10-06 (×13): 10 mL

## 2022-09-28 MED ORDER — METOLAZONE 2.5 MG PO TABS
2.5000 mg | ORAL_TABLET | Freq: Once | ORAL | Status: AC
  Administered 2022-09-28: 2.5 mg via ORAL
  Filled 2022-09-28: qty 1

## 2022-09-28 MED ORDER — POTASSIUM CHLORIDE CRYS ER 20 MEQ PO TBCR
40.0000 meq | EXTENDED_RELEASE_TABLET | Freq: Once | ORAL | Status: AC
  Administered 2022-09-28: 40 meq via ORAL
  Filled 2022-09-28: qty 2

## 2022-09-28 MED ORDER — CHLORHEXIDINE GLUCONATE CLOTH 2 % EX PADS
6.0000 | MEDICATED_PAD | Freq: Every day | CUTANEOUS | Status: DC
  Administered 2022-09-28 – 2022-10-07 (×11): 6 via TOPICAL

## 2022-09-28 MED ORDER — HEPARIN (PORCINE) IN NACL 1000-0.9 UT/500ML-% IV SOLN
INTRAVENOUS | Status: DC | PRN
  Administered 2022-09-28: 500 mL

## 2022-09-28 MED ORDER — FUROSEMIDE 10 MG/ML IJ SOLN
INTRAMUSCULAR | Status: DC | PRN
  Administered 2022-09-28: 80 mg via INTRAVENOUS

## 2022-09-28 MED ORDER — FUROSEMIDE 10 MG/ML IJ SOLN
15.0000 mg/h | INTRAVENOUS | Status: DC
  Administered 2022-09-28 – 2022-09-29 (×2): 12 mg/h via INTRAVENOUS
  Administered 2022-09-29 – 2022-10-04 (×9): 15 mg/h via INTRAVENOUS
  Filled 2022-09-28 (×14): qty 20

## 2022-09-28 SURGICAL SUPPLY — 8 items
CATH BALLN WEDGE 5F 110CM (CATHETERS) IMPLANT
GUIDEWIRE .025 260CM (WIRE) IMPLANT
KIT HEART LEFT (KITS) ×1 IMPLANT
PACK CARDIAC CATHETERIZATION (CUSTOM PROCEDURE TRAY) ×1 IMPLANT
SHEATH GLIDE SLENDER 4/5FR (SHEATH) IMPLANT
SHEATH PROBE COVER 6X72 (BAG) IMPLANT
TRANSDUCER W/STOPCOCK (MISCELLANEOUS) ×1 IMPLANT
WIRE EMERALD 3MM-J .025X260CM (WIRE) IMPLANT

## 2022-09-28 NOTE — Progress Notes (Signed)
ANTICOAGULATION CONSULT NOTE  Pharmacy Consult for heparin Indication:  atrial flutter  No Known Allergies  Patient Measurements: Height: 5\' 5"  (165.1 cm) Weight: 128.6 kg (283 lb 8 oz) IBW/kg (Calculated) : 57 Heparin Dosing Weight: 89.1 kg   Vital Signs: Temp: 98.2 F (36.8 C) (05/16 1818) Temp Source: Oral (05/16 1818) BP: 163/79 (05/16 1818) Pulse Rate: 88 (05/16 1818)  Labs: Recent Labs    09/26/22 1318 09/26/22 1859 09/26/22 2034 09/27/22 0310 09/27/22 1506 09/28/22 0243 09/28/22 1450 09/28/22 1745  HGB 12.5  --   --  12.0  --   --  11.9* 13.6  13.3  HCT 40.0  --   --  37.5  --   --  38.4 40.0  39.0  PLT 250  --   --  234  --   --  203  --   HEPARINUNFRC  --   --  0.42 0.53  --  0.63  --   --   CREATININE 2.20*  --   --  2.07*  --  1.91*  --   --   CKTOTAL  --   --   --   --  97  --   --   --   TROPONINIHS 48* 60*  --   --   --   --   --   --      Estimated Creatinine Clearance: 38.1 mL/min (A) (by C-G formula based on SCr of 1.91 mg/dL (H)).   Assessment: 50 yof presented with volume overload. Now found in Aflutter - no AC PTA.   Patient is now s/p cath. Per Dr. Shirlee Latch, ok to resume heparin drip now. Will resume previous rate as heparin level was therapeutic on it this AM.  Goal of Therapy:  Heparin level 0.3-0.7 units/ml Monitor platelets by anticoagulation protocol: Yes   Plan:  Continue heparin drip at 1250 units/hr Daily heparin level, CBC Monitor for signs/symptoms of bleeding  Thank you for allowing pharmacy to participate in this patient's care,  Loralee Pacas, PharmD, BCPS 09/28/2022 6:40 PM  Please check AMION for all [facility] Pharmacy phone numbers After 10:00 PM, call Main Pharmacy [phone]

## 2022-09-28 NOTE — Anesthesia Preprocedure Evaluation (Addendum)
Anesthesia Evaluation  Patient identified by MRN, date of birth, ID band Patient awake    Reviewed: Allergy & Precautions, NPO status , Patient's Chart, lab work & pertinent test results  History of Anesthesia Complications Negative for: history of anesthetic complications  Airway Mallampati: III  TM Distance: >3 FB Neck ROM: Full    Dental  (+) Upper Dentures, Lower Dentures   Pulmonary neg shortness of breath, sleep apnea (has not received CPAP yet) , neg COPD, neg recent URI Pulmonary nodule   Pulmonary exam normal breath sounds clear to auscultation       Cardiovascular hypertension, Pt. on medications and Pt. on home beta blockers pulmonary hypertension(-) angina + CAD, + Past MI (2014), + Cardiac Stents and +CHF  + dysrhythmias Atrial Fibrillation  Rhythm:Regular Rate:Normal  HLD  RHC 09/28/2022: 1. Markedly elevated right heart filling pressures with low but not markedly low PAPi.   2. Mildly elevated PCWP 3. Severe pulmonary arterial hypertension.  4. Low cardiac output.   TTE 09/08/2022: IMPRESSIONS     1. Limited study.   2. Left ventricular ejection fraction, by estimation, is >75%. The left  ventricle has hyperdynamic function. The left ventricle has no regional  wall motion abnormalities. There is the interventricular septum is  flattened in systole and diastole,  consistent with right ventricular pressure and volume overload.   3. Right ventricular systolic function is moderately reduced. The right  ventricular size is severely enlarged. There is moderately elevated  pulmonary artery systolic pressure. The estimated right ventricular  systolic pressure is 45.9 mmHg.   4. The mitral valve is degenerative. Trivial mitral valve regurgitation.  Moderate mitral annular calcification.   5. Tricuspid valve regurgitation is mild to moderate.   6. The aortic valve is tricuspid. There is mild calcification of the   aortic valve. Aortic valve regurgitation is not visualized.   7. The inferior vena cava is dilated in size with <50% respiratory  variability, suggesting right atrial pressure of 15 mmHg.   R/LHC 02/07/2022:   Prox Cx to Dist Cx lesion is 25% stenosed.   Mid LAD lesion is 25% stenosed.   2nd Mrg lesion is 100% stenosed.  Right to left collaterals.  Left to left collaterals.   The left ventricular systolic function is normal.   LV end diastolic pressure is mildly elevated.   The left ventricular ejection fraction is greater than 65% by visual estimate.   Hemodynamic findings consistent with severe pulmonary hypertension.   There is no aortic valve stenosis.   Aortic saturation 96% on 3 L nasal cannula, PA saturation 69% on 3 L, mean RA pressure 32 mm Hg; PA pressure 107/48, mean PA pressure 69 mmHg, pulmonary capillary wedge pressure difficult to assess due to significant V waves, and difficulty wedging.  Cardiac output 7.03 L/min, cardiac index 3.07.   Patent stents with mild restenosis in the LAD and circumflex.  Small obtuse marginal appears occluded distally with some right to left collaterals.  No target for PCI.      Neuro/Psych neg Seizures negative neurological ROS     GI/Hepatic Neg liver ROS,GERD  ,,  Endo/Other  diabetes, Type 2  Morbid obesity  Renal/GU CRFRenal disease     Musculoskeletal   Abdominal  (+) + obese  Peds  Hematology  (+) Blood dyscrasia, anemia   Anesthesia Other Findings 5/15 had syncopal episode. Heart rate 50. Unresponsive. Had brief CPR.  5/16 RHC markedly elevated RH filling pressures w/ low but not markedly  low PAPi, mildly elevated PCWP, severe PAH and low CO w/ CI of 1.73 =>started on milrinone and Lasix gtt    On Milrinone 0.125 + lasix gtt at 12/hr.    Only 1.3L in UOP yesterday. CVP remains high 24. Co-ox pending. Continues in AFL w/ RVR 110s. Had episode of near syncope earlier this morning while trying to get up out of  bed. Current SBPs 130s. SCr trending down, 2.07>>1.91>>1.63.    Reproductive/Obstetrics                              Anesthesia Physical Anesthesia Plan  ASA: 4  Anesthesia Plan: MAC   Post-op Pain Management:    Induction: Intravenous  PONV Risk Score and Plan: 3 and Treatment may vary due to age or medical condition and Propofol infusion  Airway Management Planned: Natural Airway and Nasal Cannula  Additional Equipment:   Intra-op Plan:   Post-operative Plan:   Informed Consent: I have reviewed the patients History and Physical, chart, labs and discussed the procedure including the risks, benefits and alternatives for the proposed anesthesia with the patient or authorized representative who has indicated his/her understanding and acceptance.     Dental advisory given  Plan Discussed with: Anesthesiologist and CRNA  Anesthesia Plan Comments: (Discussed with patient risks of MAC including, but not limited to, minor pain or discomfort, hearing people in the room, and possible need for backup general anesthesia. Risks for general anesthesia also discussed including, but not limited to, sore throat, hoarse voice, chipped/damaged teeth, injury to vocal cords, nausea and vomiting, allergic reactions, lung infection, heart attack, stroke, and death. All questions answered.  )       Anesthesia Quick Evaluation

## 2022-09-28 NOTE — Interval H&P Note (Signed)
History and Physical Interval Note:  09/28/2022 5:12 PM  Sandra Schneider  has presented today for surgery, with the diagnosis of CHF.  The various methods of treatment have been discussed with the patient and family. After consideration of risks, benefits and other options for treatment, the patient has consented to  Procedure(s): RIGHT HEART CATH (N/A) as a surgical intervention.  The patient's history has been reviewed, patient examined, no change in status, stable for surgery.  I have reviewed the patient's chart and labs.  Questions were answered to the patient's satisfaction.     Emireth Cockerham [employer]

## 2022-09-28 NOTE — Progress Notes (Addendum)
Advanced Heart Failure Rounding Note  PCP-Cardiologist: Dina Rich, MD   Subjective:   Admitted with marked volume overload and new atrial flutter. 5/15 had syncopal episode. Heart rate 50. Unresponsive. Had brief CPR.    Diuresed with IV lasix. I/o not accurate. Weight down 1 pound.   VQ -low probability for PE.   Denies SOB.    Objective:   Weight Range: 128.6 kg Body mass index is 47.18 kg/m.   Vital Signs:   Temp:  [97.6 F (36.4 C)-98.3 F (36.8 C)] 98 F (36.7 C) (05/16 0833) Pulse Rate:  [60-100] 89 (05/16 0833) Resp:  [18-20] 20 (05/16 0833) BP: (112-153)/(65-98) 153/92 (05/16 0833) SpO2:  [96 %-98 %] 96 % (05/16 0833) Weight:  [128.6 kg-129.1 kg] 128.6 kg (05/16 0401) Last BM Date : 09/25/22  Weight change: Filed Weights   09/27/22 0427 09/27/22 1453 09/28/22 0401  Weight: 129.1 kg 129.1 kg 128.6 kg    Intake/Output:   Intake/Output Summary (Last 24 hours) at 09/28/2022 1042 Last data filed at 09/28/2022 1610 Gross per 24 hour  Intake 427.13 ml  Output 850 ml  Net -422.87 ml      Physical Exam  General: In the chair. . No resp difficulty HEENT: normal Neck: supple. JVD 9-10 . Carotids 2+ bilat; no bruits. No lymphadenopathy or thryomegaly appreciated. Cor: PMI nondisplaced. Regular rate & rhythm. No rubs, gallops or murmurs. Lungs: clear Abdomen: soft, nontender, nondistended. No hepatosplenomegaly. No bruits or masses. Good bowel sounds. Extremities: no cyanosis, clubbing, rash, edema Neuro: alert & orientedx3, cranial nerves grossly intact. moves all 4 extremities w/o difficulty. Affect pleasant   Telemetry   A flutter 80s   EKG    N./A  Labs    CBC Recent Labs    09/26/22 1318 09/27/22 0310  WBC 7.0 7.1  HGB 12.5 12.0  HCT 40.0 37.5  MCV 101.3* 98.7  PLT 250 234   Basic Metabolic Panel Recent Labs    96/04/54 0310 09/28/22 0243  NA 137 136  K 3.9 3.7  CL 99 98  CO2 25 25  GLUCOSE 209* 148*  BUN 56* 54*   CREATININE 2.07* 1.91*  CALCIUM 9.5 9.6  MG 2.2 2.2   Liver Function Tests No results for input(s): "AST", "ALT", "ALKPHOS", "BILITOT", "PROT", "ALBUMIN" in the last 72 hours. No results for input(s): "LIPASE", "AMYLASE" in the last 72 hours. Cardiac Enzymes Recent Labs    09/27/22 1506  CKTOTAL 97    BNP: BNP (last 3 results) Recent Labs    09/06/22 1611 09/20/22 1105 09/26/22 1318  BNP 1,807.9* 2,885.7* 2,617.1*    ProBNP (last 3 results) No results for input(s): "PROBNP" in the last 8760 hours.   D-Dimer No results for input(s): "DDIMER" in the last 72 hours. Hemoglobin A1C No results for input(s): "HGBA1C" in the last 72 hours. Fasting Lipid Panel No results for input(s): "CHOL", "HDL", "LDLCALC", "TRIG", "CHOLHDL", "LDLDIRECT" in the last 72 hours. Thyroid Function Tests Recent Labs    09/26/22 1859  TSH 2.005    Other results:   Imaging    NM Pulmonary Perfusion  Result Date: 09/27/2022 CLINICAL DATA:  Chest pain EXAM: NUCLEAR MEDICINE PERFUSION LUNG SCAN TECHNIQUE: Perfusion images were obtained in multiple projections after intravenous injection of radiopharmaceutical. Ventilation scans intentionally deferred if perfusion scan and chest x-ray adequate for interpretation during COVID 19 epidemic. RADIOPHARMACEUTICALS:  4.4 mCi Tc-36m MAA IV COMPARISON:  Chest radiographs done on 09/26/2022. FINDINGS: There are no wedge-shaped segmental or  subsegmental perfusion defects. In LPO projection, there is subtle ill-defined decreased tracer activity in the posterior left mid lung field. This finding could not be definitely localized in the rest of the images. IMPRESSION: Imaging findings suggest low probability for pulmonary embolism. Electronically Signed   By: Ernie Avena M.D.   On: 09/27/2022 15:52     Medications:     Scheduled Medications:  [START ON 09/29/2022] aspirin EC  81 mg Oral Daily   atorvastatin  40 mg Oral Daily   dorzolamide-timolol   1 drop Both Eyes BID   famotidine  20 mg Oral Daily   furosemide  80 mg Intravenous BID   insulin aspart  0-15 Units Subcutaneous TID WC   loratadine  10 mg Oral Daily   metoprolol tartrate  25 mg Oral BID   sildenafil  20 mg Oral TID   sodium chloride flush  3 mL Intravenous Q12H   sodium chloride flush  3 mL Intravenous Q12H    Infusions:  sodium chloride     sodium chloride 10 mL/hr at 09/28/22 0981   heparin 1,250 Units/hr (09/28/22 0623)    PRN Medications: sodium chloride, acetaminophen **OR** acetaminophen, hydrALAZINE, loperamide, ondansetron **OR** ondansetron (ZOFRAN) IV, mouth rinse, sodium chloride flush    Patient Profile     [name] is a 67 year old with a history of CAD (NSTEMI 06/2012 w/ PCI to LAD and Lcx) LVEF 55% at that time, OSA, HTN, hyperlipidemia, hx of breast ca s/p lumpectomy and HFpEF.    Admitting with A/C HFpEF--RV Failure and new onset Atrial Flutter.   Assessment/Plan   1. A/C HFpEF -->RV Failure/Pulmonary HTN RHC (9/23) waveforms reviewed personally. RA 30, RV 107/20-30, PA 107/48 (69), unable to wedge; LVEDP 15. AO 140/64. LVEDP ~16. Assuming a PCWP of 20, TPG would be 49 w/ PVR of 7.  - Hemodynamics consistent with severe pulmonary hypertension largely due to pre-capillary PH.  - Labs: ANA, HCV negative. HIV pending. LFTs mostly unremarkable.  - PFTs w/ moderate restriction.  - CT chest w/ pulmonary nodules, however, follow up PET mostly unremarkable.  - Echo (3/24): RV severely enlarged w/ severely reduced RVSF .  - CMRI 09/2022 - EF 75% RV moderately dilated Mod TR. Possible myocarditis versus sarcoid.  Failed outpatient oral diuretics. Recently torsemide was stopped and she was switched to lasix 60 mg twice a day.  Presented to HF clinic with marked volume overload.  VQ scan - No PE.   Volume status improving. Continue IV lasix 80 mg twice a day. Adjust diuretics after cath.  -RHC today.  Continue  sildenafil for now.    2. New  Atrial Flutter EKG on admit ---> A flutter with controlled rate.  Continue  heparin drip for now. Eventual switch to DOAC.  Continue lopressor 25 mg twice a day   Will need to diurese for now.  - Plan TEE DC-CV tomorrow.    3. OSA Needs CPAP but apparently she doesn't have the machine yet.    4. CAD No chest pain. Restart statin and aspirin.    5.  DMII   6. HTN  Stop Bidil with PDE5.   7.Syncope Syncopal episode last week sitting on her bed.  Last night she had syncopal episode walking back from the bathroom. No arrhythmias. Suspect r/t RV failure/PH.  8,. CKD Stage IIIb Creatinine baseline  unclear. Recently ~ 2.  Creatinine 1.9 today.  No recent NSAIDs.   RHC later today.    Length of  Stay: 2  Tonye Becket, NP  09/28/2022, 10:42 AM  Advanced Heart Failure Team Pager [phone] (M-F; 7a - 5p)  Please contact [facility] for night-coverage after hours (5p -7a ) and weekends on [email or web address]  Seen with NP, agree with note.   She is in atrial flutter today.  Still feels swollen.  Not much UOP documented.    RHC Procedural Findings: Hemodynamics (mmHg) RA 32 RV 95/32 PA 93/44, mean 66 PCWP mean 19 Oxygen saturations: PA 49% AO 96% Cardiac Output (Fick) 3.98  Cardiac Index (Fick) 1.73 PVR 11.8 PAPi 1.5   General: NAD Neck: Thick, JVP 16+, no thyromegaly or thyroid nodule.  Lungs: Clear to auscultation bilaterally with normal respiratory effort. CV: Nondisplaced PMI.  Heart irregular S1/S2, no S3/S4, no murmur.  1+ ankle edema.  Abdomen: Soft, nontender, no hepatosplenomegaly, moderate distention.  Skin: Intact without lesions or rashes.  Neurologic: Alert and oriented x 3.  Psych: Normal affect. Extremities: No clubbing or cyanosis.  HEENT: Normal.   1. RV failure/pulmonary hypertension:  RHC (9/23) with RA 30, RV 107/20-30, PA 107/48 (69), unable to wedge; LVEDP 15. AO 140/64. LVEDP ~16. Assuming a PCWP of 20, TPG would be 49 w/ PVR of 7.  Echo 3/24 with RV  severely enlarged w/ severely reduced RV function.  Cardiac MRI in 5/24 with EF 75%, moderate RV dilation with RV EF 50%, moderate TR, mid-wall basal septal/basal inferolateral LGE.  Could be seen with cardiac sarcoidosis (vs prior myocarditis).  CT chest showed RUL nodule (negative on PET) and mediastinal LAN.   She is volume overloaded on exam.  V/Q scan not suggestive of chronic PEs.  She has been on sildenafil 20 tid as outpatient. RHC done today showed severe PAH with low CI 1.73, PAPi 1.5, RA pressure 32 (R>>L heart failure).  Possible group 1 with OSA contributing.  - Lasix 80 mg IV x 1 now then start 12 mg/hr gtt.  - Metolazone 2.5 x 1 now.  - Will add milrinone 0.125 with low CO and high PA pressure.  Watch HR with addition of milrinone (atrial flutter).  - Place PICC for CVP and co-ox.  - Can continue current sildenafil.  - Can continue sildenafil today. - Sent autoimmune serologies.  - Needs eventual cardiac PET to assess for cardiac sarcoidosis.   - Severe OSA, needs CPAP. Will use in hospital.  2. Syncope: Suspect this was due to severe PH/RV failure.  No arrhythmias noted on telemetry.  3. Atrial flutter: New diagnosis.  Rate is controlled.   - Continue heparin gtt.  - She will need TEE-DCCV, plan for Friday if she diureses well overnight.  - Metoprolol low dose for now.  If HR rises on milrinone, can use amiodarone.  4. CAD: Remote PCI LAD and LCx.  - Continue ASA and statin.  5. CKD stage 3: creatinine lower at 2.07 => 1.91.  Watch with diuresis.   Marca Ancona 09/28/2022 6:09 PM

## 2022-09-28 NOTE — H&P (View-Only) (Signed)
  Advanced Heart Failure Rounding Note  PCP-Cardiologist: Branch, Jonathan, MD   Subjective:   Admitted with marked volume overload and new atrial flutter. 5/15 had syncopal episode. Heart rate 50. Unresponsive. Had brief CPR.    Diuresed with IV lasix. I/o not accurate. Weight down 1 pound.   VQ -low probability for PE.   Denies SOB.    Objective:   Weight Range: 128.6 kg Body mass index is 47.18 kg/m.   Vital Signs:   Temp:  [97.6 F (36.4 C)-98.3 F (36.8 C)] 98 F (36.7 C) (05/16 0833) Pulse Rate:  [60-100] 89 (05/16 0833) Resp:  [18-20] 20 (05/16 0833) BP: (112-153)/(65-98) 153/92 (05/16 0833) SpO2:  [96 %-98 %] 96 % (05/16 0833) Weight:  [128.6 kg-129.1 kg] 128.6 kg (05/16 0401) Last BM Date : 09/25/22  Weight change: Filed Weights   09/27/22 0427 09/27/22 1453 09/28/22 0401  Weight: 129.1 kg 129.1 kg 128.6 kg    Intake/Output:   Intake/Output Summary (Last 24 hours) at 09/28/2022 1042 Last data filed at 09/28/2022 0623 Gross per 24 hour  Intake 427.13 ml  Output 850 ml  Net -422.87 ml      Physical Exam  General: In the chair. . No resp difficulty HEENT: normal Neck: supple. JVD 9-10 . Carotids 2+ bilat; no bruits. No lymphadenopathy or thryomegaly appreciated. Cor: PMI nondisplaced. Regular rate & rhythm. No rubs, gallops or murmurs. Lungs: clear Abdomen: soft, nontender, nondistended. No hepatosplenomegaly. No bruits or masses. Good bowel sounds. Extremities: no cyanosis, clubbing, rash, edema Neuro: alert & orientedx3, cranial nerves grossly intact. moves all 4 extremities w/o difficulty. Affect pleasant   Telemetry   A flutter 80s   EKG    N./A  Labs    CBC Recent Labs    09/26/22 1318 09/27/22 0310  WBC 7.0 7.1  HGB 12.5 12.0  HCT 40.0 37.5  MCV 101.3* 98.7  PLT 250 234   Basic Metabolic Panel Recent Labs    09/27/22 0310 09/28/22 0243  NA 137 136  K 3.9 3.7  CL 99 98  CO2 25 25  GLUCOSE 209* 148*  BUN 56* 54*   CREATININE 2.07* 1.91*  CALCIUM 9.5 9.6  MG 2.2 2.2   Liver Function Tests No results for input(s): "AST", "ALT", "ALKPHOS", "BILITOT", "PROT", "ALBUMIN" in the last 72 hours. No results for input(s): "LIPASE", "AMYLASE" in the last 72 hours. Cardiac Enzymes Recent Labs    09/27/22 1506  CKTOTAL 97    BNP: BNP (last 3 results) Recent Labs    09/06/22 1611 09/20/22 1105 09/26/22 1318  BNP 1,807.9* 2,885.7* 2,617.1*    ProBNP (last 3 results) No results for input(s): "PROBNP" in the last 8760 hours.   D-Dimer No results for input(s): "DDIMER" in the last 72 hours. Hemoglobin A1C No results for input(s): "HGBA1C" in the last 72 hours. Fasting Lipid Panel No results for input(s): "CHOL", "HDL", "LDLCALC", "TRIG", "CHOLHDL", "LDLDIRECT" in the last 72 hours. Thyroid Function Tests Recent Labs    09/26/22 1859  TSH 2.005    Other results:   Imaging    NM Pulmonary Perfusion  Result Date: 09/27/2022 CLINICAL DATA:  Chest pain EXAM: NUCLEAR MEDICINE PERFUSION LUNG SCAN TECHNIQUE: Perfusion images were obtained in multiple projections after intravenous injection of radiopharmaceutical. Ventilation scans intentionally deferred if perfusion scan and chest x-ray adequate for interpretation during COVID 19 epidemic. RADIOPHARMACEUTICALS:  4.4 mCi Tc-99m MAA IV COMPARISON:  Chest radiographs done on 09/26/2022. FINDINGS: There are no wedge-shaped segmental or   subsegmental perfusion defects. In LPO projection, there is subtle ill-defined decreased tracer activity in the posterior left mid lung field. This finding could not be definitely localized in the rest of the images. IMPRESSION: Imaging findings suggest low probability for pulmonary embolism. Electronically Signed   By: Palani  Rathinasamy M.D.   On: 09/27/2022 15:52     Medications:     Scheduled Medications:  [START ON 09/29/2022] aspirin EC  81 mg Oral Daily   atorvastatin  40 mg Oral Daily   dorzolamide-timolol   1 drop Both Eyes BID   famotidine  20 mg Oral Daily   furosemide  80 mg Intravenous BID   insulin aspart  0-15 Units Subcutaneous TID WC   loratadine  10 mg Oral Daily   metoprolol tartrate  25 mg Oral BID   sildenafil  20 mg Oral TID   sodium chloride flush  3 mL Intravenous Q12H   sodium chloride flush  3 mL Intravenous Q12H    Infusions:  sodium chloride     sodium chloride 10 mL/hr at 09/28/22 0623   heparin 1,250 Units/hr (09/28/22 0623)    PRN Medications: sodium chloride, acetaminophen **OR** acetaminophen, hydrALAZINE, loperamide, ondansetron **OR** ondansetron (ZOFRAN) IV, mouth rinse, sodium chloride flush    Patient Profile     Ms Kowaleski is a 67 year old with a history of CAD (NSTEMI 06/2012 w/ PCI to LAD and Lcx) LVEF 55% at that time, OSA, HTN, hyperlipidemia, hx of breast ca s/p lumpectomy and HFpEF.    Admitting with A/C HFpEF--RV Failure and new onset Atrial Flutter.   Assessment/Plan   1. A/C HFpEF -->RV Failure/Pulmonary HTN RHC (9/23) waveforms reviewed personally. RA 30, RV 107/20-30, PA 107/48 (69), unable to wedge; LVEDP 15. AO 140/64. LVEDP ~16. Assuming a PCWP of 20, TPG would be 49 w/ PVR of 7.  - Hemodynamics consistent with severe pulmonary hypertension largely due to pre-capillary PH.  - Labs: ANA, HCV negative. HIV pending. LFTs mostly unremarkable.  - PFTs w/ moderate restriction.  - CT chest w/ pulmonary nodules, however, follow up PET mostly unremarkable.  - Echo (3/24): RV severely enlarged w/ severely reduced RVSF .  - CMRI 09/2022 - EF 75% RV moderately dilated Mod TR. Possible myocarditis versus sarcoid.  Failed outpatient oral diuretics. Recently torsemide was stopped and she was switched to lasix 60 mg twice a day.  Presented to HF clinic with marked volume overload.  VQ scan - No PE.   Volume status improving. Continue IV lasix 80 mg twice a day. Adjust diuretics after cath.  -RHC today.  Continue  sildenafil for now.    2. New  Atrial Flutter EKG on admit ---> A flutter with controlled rate.  Continue  heparin drip for now. Eventual switch to DOAC.  Continue lopressor 25 mg twice a day   Will need to diurese for now.  - Plan TEE DC-CV tomorrow.    3. OSA Needs CPAP but apparently she doesn't have the machine yet.    4. CAD No chest pain. Restart statin and aspirin.    5.  DMII   6. HTN  Stop Bidil with PDE5.   7.Syncope Syncopal episode last week sitting on her bed.  Last night she had syncopal episode walking back from the bathroom. No arrhythmias. Suspect r/t RV failure/PH.  8,. CKD Stage IIIb Creatinine baseline  unclear. Recently ~ 2.  Creatinine 1.9 today.  No recent NSAIDs.   RHC later today.    Length of   Stay: 2  Amy Clegg, NP  09/28/2022, 10:42 AM  Advanced Heart Failure Team Pager [phone] (M-F; 7a - 5p)  Please contact [facility] for night-coverage after hours (5p -7a ) and weekends on [email or web address]  Seen with NP, agree with note.   She is in atrial flutter today.  Still feels swollen.  Not much UOP documented.    RHC Procedural Findings: Hemodynamics (mmHg) RA 32 RV 95/32 PA 93/44, mean 66 PCWP mean 19 Oxygen saturations: PA 49% AO 96% Cardiac Output (Fick) 3.98  Cardiac Index (Fick) 1.73 PVR 11.8 PAPi 1.5   General: NAD Neck: Thick, JVP 16+, no thyromegaly or thyroid nodule.  Lungs: Clear to auscultation bilaterally with normal respiratory effort. CV: Nondisplaced PMI.  Heart irregular S1/S2, no S3/S4, no murmur.  1+ ankle edema.  Abdomen: Soft, nontender, no hepatosplenomegaly, moderate distention.  Skin: Intact without lesions or rashes.  Neurologic: Alert and oriented x 3.  Psych: Normal affect. Extremities: No clubbing or cyanosis.  HEENT: Normal.   1. RV failure/pulmonary hypertension:  RHC (9/23) with RA 30, RV 107/20-30, PA 107/48 (69), unable to wedge; LVEDP 15. AO 140/64. LVEDP ~16. Assuming a PCWP of 20, TPG would be 49 w/ PVR of 7.  Echo 3/24 with RV  severely enlarged w/ severely reduced RV function.  Cardiac MRI in 5/24 with EF 75%, moderate RV dilation with RV EF 50%, moderate TR, mid-wall basal septal/basal inferolateral LGE.  Could be seen with cardiac sarcoidosis (vs prior myocarditis).  CT chest showed RUL nodule (negative on PET) and mediastinal LAN.   She is volume overloaded on exam.  V/Q scan not suggestive of chronic PEs.  She has been on sildenafil 20 tid as outpatient. RHC done today showed severe PAH with low CI 1.73, PAPi 1.5, RA pressure 32 (R>>L heart failure).  Possible group 1 with OSA contributing.  - Lasix 80 mg IV x 1 now then start 12 mg/hr gtt.  - Metolazone 2.5 x 1 now.  - Will add milrinone 0.125 with low CO and high PA pressure.  Watch HR with addition of milrinone (atrial flutter).  - Place PICC for CVP and co-ox.  - Can continue current sildenafil.  - Can continue sildenafil today. - Sent autoimmune serologies.  - Needs eventual cardiac PET to assess for cardiac sarcoidosis.   - Severe OSA, needs CPAP. Will use in hospital.  2. Syncope: Suspect this was due to severe PH/RV failure.  No arrhythmias noted on telemetry.  3. Atrial flutter: New diagnosis.  Rate is controlled.   - Continue heparin gtt.  - She will need TEE-DCCV, plan for Friday if she diureses well overnight.  - Metoprolol low dose for now.  If HR rises on milrinone, can use amiodarone.  4. CAD: Remote PCI LAD and LCx.  - Continue ASA and statin.  5. CKD stage 3: creatinine lower at 2.07 => 1.91.  Watch with diuresis.   Yader Criger 09/28/2022 6:09 PM  

## 2022-09-28 NOTE — Progress Notes (Signed)
Progress Note   Patient: Sandra Schneider ZOX:[id number] DOB: 17-Feb-1956 DOA: 09/26/2022     2 DOS: the patient was seen and examined on 09/28/2022   Brief hospital course: Mrs. Widen was admitted to the hospital with the working diagnosis of heart failure exacerbation.   67 yo female with the past medical history of heart failure, CKD stage 3b, coronary artery disease, breast cancer, T2DM, and hypertension who presented with dyspnea. Patient had several weeks or worsening dyspnea and lower extremity edema. Out patient follow up with cardiology on the day of admission she was found volume overloaded and recommended ER evaluation. On her initial physical examination her blood pressure was 120/80, HR 80, RR 23 and 02 saturation 95%, lungs with no wheezing or rales, heart with S1 and S2 present, irregularly irregular with no gallops, rubs or murmurs, abdomen with no distention, and bilateral lower extremity edema.   Na 137, K 4,5 Cl 98, bicarbonate 26, glucose 153, bun 59, cr 2.2  BNP 2,671  High sensitive troponin 48 and 60  Wbc 7,0 hgb 12,5 plt 250   Chest radiograph with cardiomegaly, bilateral hilar vascular congestion, bilateral interstitial infiltrates. Right upper lobe nodule.   EKG 80 bpm, normal axis, normal intervals, atrial flutter (typical) 3:1 conduction, with no significant ST segment changes, negative T wave lead II, III, AvF, V4 to V6.   05/14 bradycardic episode, 50 bpm, with decreased responsiveness. Improved with chest compression. (No cardiac arrest).  05/16 plan for right heart catheterization.   Assessment and Plan: * New onset atrial flutter (HCC) Rate controlled atrial flutter.  Anticoagulation with heparin drip until cardiac work up completed.   Acute on chronic diastolic CHF (congestive heart failure) (HCC) Echocardiogram with preserved LV systolic function EF >75%, interventricular septum is flattened in systole and diastole. RV with severe enlargement, RV  systolic function with moderate reduction, RVSP 45.9 mmHg. TR mild to moderate.   Acute on chronic core pulmonale.  Pulmonary hypertension (type 4 or 5).   V/Q scan with low probability for pulmonary embolism.   Documented urine output is 850 cc Systolic blood pressure 140 to 150 mmHg.   Plan to continue diuresis with furosemide 80 mg IV q12 hrs.  Metoprolol.  Sildenafil for pulmonary hypertension.  Plan for right heart catheterization today, to assess filling pressures.   Stage 4 chronic kidney disease (HCC) AKI,   Renal function with serum cr at 1,91 with K at 3,7 and serum bicarbonate at 25. Na 136  Mag 2,2   Plan to continue diuresis with furosemide.  Follow up renal function and electrolytes in am.   Essential (primary) hypertension Continue blood pressure monitoring Continue with metoprolol.   Type 2 diabetes mellitus with hyperglycemia (HCC) Uncontrolled T2Dm with hyperglycemia.   Continue glucose cover and monitoring with insulin sliding scale.    Malignant neoplasm of upper-outer quadrant of right breast in female, estrogen receptor positive (HCC) Follow up as outpatient.   Class 3 obesity (HCC) Calculated BMI is 47,3   Solitary pulmonary nodule on lung CT Follow up as outpatient.         Subjective: Patient with no chest pain, dyspnea and edema continue to improve, she has been out of the bed to the chair.   Physical Exam: Vitals:   09/27/22 2255 09/28/22 0401 09/28/22 0833 09/28/22 1214  BP: (!) 149/98 124/74 (!) 153/92 (!) 146/82  Pulse:  88 89 81  Resp:  20 20 19   Temp:  98.3 F (36.8 C) 98 F (  36.7 C) 97.9 F (36.6 C)  TempSrc:  Oral Oral Oral  SpO2:  96% 96% 96%  Weight:  128.6 kg    Height:       Neurology awake and alert ENT with mild pallor Cardiovascular with S1 and S2 present regular positive systolic murmur at the right sternal border,  No JVD Trace pitting lower extremity edema Respiratory with no rales or wheezing, no  rhonchi Abdomen with no distention  Data Reviewed:    Family Communication: no family at the bedside   Disposition: Status is: Inpatient Remains inpatient appropriate because: heart failure   Planned Discharge Destination: Home    Author: Coralie Keens, MD 09/28/2022 2:46 PM  For on call review [email or web address].

## 2022-09-28 NOTE — Progress Notes (Signed)
ANTICOAGULATION CONSULT NOTE  Pharmacy Consult for heparin Indication:  atrial flutter  No Known Allergies  Patient Measurements: Height: 5\' 5"  (165.1 cm) Weight: 128.6 kg (283 lb 8 oz) IBW/kg (Calculated) : 57 Heparin Dosing Weight: 89.1 kg   Vital Signs: Temp: 97.9 F (36.6 C) (05/16 1214) Temp Source: Oral (05/16 1214) BP: 146/82 (05/16 1214) Pulse Rate: 81 (05/16 1214)  Labs: Recent Labs    09/26/22 1318 09/26/22 1859 09/26/22 2034 09/27/22 0310 09/27/22 1506 09/28/22 0243  HGB 12.5  --   --  12.0  --   --   HCT 40.0  --   --  37.5  --   --   PLT 250  --   --  234  --   --   HEPARINUNFRC  --   --  0.42 0.53  --  0.63  CREATININE 2.20*  --   --  2.07*  --  1.91*  CKTOTAL  --   --   --   --  97  --   TROPONINIHS 48* 60*  --   --   --   --      Estimated Creatinine Clearance: 38.1 mL/min (A) (by C-G formula based on SCr of 1.91 mg/dL (H)).   Assessment: 68 yof presented with volume overload. Now found in Aflutter - no AC PTA.   Heparin level is therapeutic at 0.63, on heparin 1250 units/hr. Hgb 12, plt 234. No s/sx of bleeding or infusion issues.  Goal of Therapy:  Heparin level 0.3-0.7 units/ml Monitor platelets by anticoagulation protocol: Yes   Plan:  Continue heparin drip at 1250 units/hr Daily heparin level, CBC Monitor for signs/symptoms of bleeding  Thank you for allowing pharmacy to participate in this patient's care,  Jenetta Downer, [facility] Clinical Pharmacist  09/28/2022 1:23 PM   [facility] pharmacy phone numbers are listed on [email or web address]

## 2022-09-28 NOTE — Progress Notes (Signed)
Peripherally Inserted Central Catheter Placement  The IV Nurse has discussed with the patient and/or persons authorized to consent for the patient, the purpose of this procedure and the potential benefits and risks involved with this procedure.  The benefits include less needle sticks, lab draws from the catheter, and the patient may be discharged home with the catheter. Risks include, but not limited to, infection, bleeding, blood clot (thrombus formation), and puncture of an artery; nerve damage and irregular heartbeat and possibility to perform a PICC exchange if needed/ordered by physician.  Alternatives to this procedure were also discussed.  Bard Power PICC patient education guide, fact sheet on infection prevention and patient information card has been provided to patient /or left at bedside.    PICC Placement Documentation  PICC Triple Lumen 09/28/22 Left Brachial 45 cm 0 cm (Active)  Indication for Insertion or Continuance of Line Vasoactive infusions 09/28/22 2010  Exposed Catheter (cm) 0 cm 09/28/22 2010  Site Assessment Clean, Dry, Intact 09/28/22 2010  Lumen #1 Status Flushed;Blood return noted;Saline locked 09/28/22 2010  Lumen #2 Status Flushed;Blood return noted;Saline locked 09/28/22 2010  Lumen #3 Status Flushed;Blood return noted;Saline locked 09/28/22 2010  Dressing Type Transparent 09/28/22 2010  Dressing Status Antimicrobial disc in place 09/28/22 2010  Safety Lock Not Applicable 09/28/22 2010  Line Care Connections checked and tightened 09/28/22 2010  Line Adjustment (NICU/IV Team Only) No 09/28/22 2010  Dressing Intervention New dressing 09/28/22 2010  Dressing Change Due 10/05/22 09/28/22 2010       Audrie Gallus 09/28/2022, 8:13 PM

## 2022-09-29 ENCOUNTER — Encounter (HOSPITAL_COMMUNITY): Payer: Self-pay | Admitting: Cardiology

## 2022-09-29 ENCOUNTER — Inpatient Hospital Stay (HOSPITAL_COMMUNITY): Payer: Medicare Other

## 2022-09-29 ENCOUNTER — Inpatient Hospital Stay (HOSPITAL_COMMUNITY): Payer: Medicare Other | Admitting: Anesthesiology

## 2022-09-29 ENCOUNTER — Encounter (HOSPITAL_COMMUNITY)
Admission: EM | Disposition: A | Discharge status: Home / Self Care | Payer: Self-pay | Source: Home / Self Care | Attending: Internal Medicine

## 2022-09-29 DIAGNOSIS — I5033 Acute on chronic diastolic (congestive) heart failure: Secondary | ICD-10-CM | POA: Diagnosis not present

## 2022-09-29 DIAGNOSIS — E1122 Type 2 diabetes mellitus with diabetic chronic kidney disease: Secondary | ICD-10-CM

## 2022-09-29 DIAGNOSIS — N184 Chronic kidney disease, stage 4 (severe): Secondary | ICD-10-CM | POA: Diagnosis not present

## 2022-09-29 DIAGNOSIS — Z955 Presence of coronary angioplasty implant and graft: Secondary | ICD-10-CM

## 2022-09-29 DIAGNOSIS — I10 Essential (primary) hypertension: Secondary | ICD-10-CM | POA: Diagnosis not present

## 2022-09-29 DIAGNOSIS — I509 Heart failure, unspecified: Secondary | ICD-10-CM

## 2022-09-29 DIAGNOSIS — I361 Nonrheumatic tricuspid (valve) insufficiency: Secondary | ICD-10-CM

## 2022-09-29 DIAGNOSIS — R911 Solitary pulmonary nodule: Secondary | ICD-10-CM

## 2022-09-29 DIAGNOSIS — N189 Chronic kidney disease, unspecified: Secondary | ICD-10-CM

## 2022-09-29 DIAGNOSIS — I130 Hypertensive heart and chronic kidney disease with heart failure and stage 1 through stage 4 chronic kidney disease, or unspecified chronic kidney disease: Secondary | ICD-10-CM

## 2022-09-29 DIAGNOSIS — I50810 Right heart failure, unspecified: Secondary | ICD-10-CM | POA: Diagnosis not present

## 2022-09-29 DIAGNOSIS — I4892 Unspecified atrial flutter: Secondary | ICD-10-CM | POA: Diagnosis not present

## 2022-09-29 DIAGNOSIS — I252 Old myocardial infarction: Secondary | ICD-10-CM

## 2022-09-29 DIAGNOSIS — I2510 Atherosclerotic heart disease of native coronary artery without angina pectoris: Secondary | ICD-10-CM

## 2022-09-29 HISTORY — PX: CARDIOVERSION: SHX1299

## 2022-09-29 HISTORY — PX: TEE WITHOUT CARDIOVERSION: SHX5443

## 2022-09-29 LAB — CBC
HCT: 37.3 % (ref 36.0–46.0)
Hemoglobin: 11.6 g/dL — ABNORMAL LOW (ref 12.0–15.0)
MCH: 31.4 pg (ref 26.0–34.0)
MCHC: 31.1 g/dL (ref 30.0–36.0)
MCV: 101.1 fL — ABNORMAL HIGH (ref 80.0–100.0)
Platelets: 196 10*3/uL (ref 150–400)
RBC: 3.69 MIL/uL — ABNORMAL LOW (ref 3.87–5.11)
RDW: 15.6 % — ABNORMAL HIGH (ref 11.5–15.5)
WBC: 6.6 10*3/uL (ref 4.0–10.5)
nRBC: 0 % (ref 0.0–0.2)

## 2022-09-29 LAB — BASIC METABOLIC PANEL
Anion gap: 12 (ref 5–15)
BUN: 47 mg/dL — ABNORMAL HIGH (ref 8–23)
CO2: 27 mmol/L (ref 22–32)
Calcium: 9.4 mg/dL (ref 8.9–10.3)
Chloride: 98 mmol/L (ref 98–111)
Creatinine, Ser: 1.63 mg/dL — ABNORMAL HIGH (ref 0.44–1.00)
GFR, Estimated: 34 mL/min — ABNORMAL LOW (ref >60)
Glucose, Bld: 138 mg/dL — ABNORMAL HIGH (ref 70–99)
Potassium: 4.4 mmol/L (ref 3.5–5.1)
Sodium: 137 mmol/L (ref 135–145)

## 2022-09-29 LAB — GLUCOSE, CAPILLARY
Glucose-Capillary: 99 mg/dL (ref 70–99)
Glucose-Capillary: 137 mg/dL — ABNORMAL HIGH (ref 70–99)
Glucose-Capillary: 132 mg/dL — ABNORMAL HIGH (ref 70–99)
Glucose-Capillary: 198 mg/dL — ABNORMAL HIGH (ref 70–99)
Glucose-Capillary: 157 mg/dL — ABNORMAL HIGH (ref 70–99)

## 2022-09-29 LAB — C4 COMPLEMENT: Complement C4, Body Fluid: 32 mg/dL (ref 12–38)

## 2022-09-29 LAB — COOXEMETRY PANEL
Carboxyhemoglobin: 1.8 % — ABNORMAL HIGH (ref 0.5–1.5)
Methemoglobin: 1.3 % (ref 0.0–1.5)
O2 Saturation: 55.9 %
Total hemoglobin: 12 g/dL (ref 12.0–16.0)

## 2022-09-29 LAB — PROTIME-INR
INR: 1.4 — ABNORMAL HIGH (ref 0.8–1.2)
Prothrombin Time: 16.9 seconds — ABNORMAL HIGH (ref 11.4–15.2)

## 2022-09-29 LAB — ECHO TEE

## 2022-09-29 LAB — HEPARIN LEVEL (UNFRACTIONATED): Heparin Unfractionated: 0.32 IU/mL (ref 0.30–0.70)

## 2022-09-29 LAB — RHEUMATOID FACTOR: Rheumatoid fact SerPl-aCnc: 56.7 IU/mL — ABNORMAL HIGH (ref <14.0)

## 2022-09-29 LAB — C3 COMPLEMENT: C3 Complement: 152 mg/dL (ref 82–167)

## 2022-09-29 SURGERY — ECHOCARDIOGRAM, TRANSESOPHAGEAL
Anesthesia: Monitor Anesthesia Care

## 2022-09-29 MED ORDER — ACETAZOLAMIDE 250 MG PO TABS
250.0000 mg | ORAL_TABLET | Freq: Once | ORAL | Status: AC
  Administered 2022-09-29: 250 mg via ORAL
  Filled 2022-09-29: qty 1

## 2022-09-29 MED ORDER — AMIODARONE HCL IN DEXTROSE 360-4.14 MG/200ML-% IV SOLN
60.0000 mg/h | INTRAVENOUS | Status: AC
  Administered 2022-09-29 (×2): 60 mg/h via INTRAVENOUS
  Filled 2022-09-29: qty 200

## 2022-09-29 MED ORDER — AMIODARONE LOAD VIA INFUSION
150.0000 mg | Freq: Once | INTRAVENOUS | Status: AC
  Administered 2022-09-29: 150 mg via INTRAVENOUS
  Filled 2022-09-29: qty 83.34

## 2022-09-29 MED ORDER — AMISULPRIDE (ANTIEMETIC) 5 MG/2ML IV SOLN: 10.0000 mg | Freq: Once | INTRAVENOUS | Status: DC | PRN

## 2022-09-29 MED ORDER — EPHEDRINE SULFATE-NACL 50-0.9 MG/10ML-% IV SOSY
PREFILLED_SYRINGE | INTRAVENOUS | Status: DC | PRN
  Administered 2022-09-29 (×2): 10 mg via INTRAVENOUS

## 2022-09-29 MED ORDER — HEPARIN SODIUM (PORCINE) 1000 UNIT/ML IJ SOLN
INTRAMUSCULAR | Status: DC | PRN
  Administered 2022-09-29: 5000 [IU] via INTRAVENOUS

## 2022-09-29 MED ORDER — METOLAZONE 5 MG PO TABS
2.5000 mg | ORAL_TABLET | Freq: Once | ORAL | Status: AC
  Administered 2022-09-29: 2.5 mg via ORAL
  Filled 2022-09-29: qty 1

## 2022-09-29 MED ORDER — SODIUM CHLORIDE 0.9 % IV SOLN: INTRAVENOUS | Status: DC | PRN

## 2022-09-29 MED ORDER — PHENYLEPHRINE 80 MCG/ML (10ML) SYRINGE FOR IV PUSH (FOR BLOOD PRESSURE SUPPORT)
PREFILLED_SYRINGE | INTRAVENOUS | Status: DC | PRN
  Administered 2022-09-29 (×2): 160 ug via INTRAVENOUS

## 2022-09-29 MED ORDER — SODIUM CHLORIDE 0.9 % IV SOLN: INTRAVENOUS | Status: DC

## 2022-09-29 MED ORDER — PROPOFOL 10 MG/ML IV BOLUS
INTRAVENOUS | Status: DC | PRN
  Administered 2022-09-29 (×4): 50 mg via INTRAVENOUS

## 2022-09-29 MED ORDER — AMIODARONE HCL IN DEXTROSE 360-4.14 MG/200ML-% IV SOLN
30.0000 mg/h | INTRAVENOUS | Status: DC
  Administered 2022-09-29 – 2022-10-06 (×14): 30 mg/h via INTRAVENOUS
  Filled 2022-09-29 (×14): qty 200

## 2022-09-29 SURGICAL SUPPLY — 1 items: ELECT DEFIB PAD ADLT CADENCE (PAD) ×1 IMPLANT

## 2022-09-29 NOTE — Progress Notes (Addendum)
Advanced Heart Failure Rounding Note  PCP-Cardiologist: Dina Rich, MD   Subjective:   Admitted with marked volume overload and new atrial flutter. 5/15 had syncopal episode. Heart rate 50. Unresponsive. Had brief CPR.  5/16 RHC markedly elevated RH filling pressures w/ low but not markedly low PAPi, mildly elevated PCWP, severe PAH and low CO w/ CI of 1.73 =>started on milrinone and Lasix gtt   On Milrinone 0.125 + lasix gtt at 12/hr.   Only 1.3L in UOP yesterday. CVP remains high 24. Co-ox pending. Continues in AFL w/ RVR 110s. Had episode of near syncope earlier this morning while trying to get up out of bed. Current SBPs 130s. SCr trending down, 2.07>>1.91>>1.63.    RHC 5/16 Hemodynamics (mmHg) RA 32 RV 95/32 PA 93/44, mean 66 PCWP mean 19  Oxygen saturations: PA 49% AO 96%  Cardiac Output (Fick) 3.98  Cardiac Index (Fick) 1.73 PVR 11.8 PAPi 1.5   VQ -low probability for PE.     Objective:   Weight Range: 128 kg Body mass index is 46.94 kg/m.   Vital Signs:   Temp:  [97.6 F (36.4 C)-98.2 F (36.8 C)] 97.6 F (36.4 C) (05/17 0409) Pulse Rate:  [73-107] 96 (05/17 0409) Resp:  [15-20] 18 (05/17 0409) BP: (122-167)/(49-93) 132/81 (05/17 0409) SpO2:  [92 %-98 %] 94 % (05/17 0409) Weight:  [128 kg] 128 kg (05/17 0409) Last BM Date : 09/25/22  Weight change: Filed Weights   09/27/22 1453 09/28/22 0401 09/29/22 0409  Weight: 129.1 kg 128.6 kg 128 kg    Intake/Output:   Intake/Output Summary (Last 24 hours) at 09/29/2022 0905 Last data filed at 09/29/2022 0541 Gross per 24 hour  Intake 425.28 ml  Output 1340 ml  Net -914.72 ml      Physical Exam   CVP 24  General:  chronically ill appearing, morbidly obese, requiring [place], No respiratory difficulty HEENT: normal Neck: supple. Short thick neck, JVD not well visualized. Carotids 2+ bilat; no bruits. No lymphadenopathy or thyromegaly appreciated. Cor: PMI nondisplaced. Irregularly irregular  rhythm and tachy rate. No rubs, gallops or murmurs. Lungs: decreased BS at the bases  Abdomen: soft, nontender, +++ distended. No hepatosplenomegaly. No bruits or masses. Good bowel sounds. Extremities: no cyanosis, clubbing, rash, edema, b/l chronic venus stasis dermatitis, LUE PICC  Neuro: alert & oriented x 3, cranial nerves grossly intact. moves all 4 extremities w/o difficulty. Affect pleasant.   Telemetry   A flutter 80s   EKG    N./A  Labs    CBC Recent Labs    09/28/22 1450 09/28/22 1745 09/29/22 0500  WBC 6.2  --  6.6  HGB 11.9* 13.6  13.3 11.6*  HCT 38.4 40.0  39.0 37.3  MCV 102.1*  --  101.1*  PLT 203  --  196   Basic Metabolic Panel Recent Labs    16/10/96 0310 09/28/22 0243 09/28/22 1745 09/29/22 0500  NA 137 136 139  140 137  K 3.9 3.7 4.3  4.1 4.4  CL 99 98  --  98  CO2 25 25  --  27  GLUCOSE 209* 148*  --  138*  BUN 56* 54*  --  47*  CREATININE 2.07* 1.91*  --  1.63*  CALCIUM 9.5 9.6  --  9.4  MG 2.2 2.2  --   --    Liver Function Tests No results for input(s): "AST", "ALT", "ALKPHOS", "BILITOT", "PROT", "ALBUMIN" in the last 72 hours. No results for input(s): "LIPASE", "  AMYLASE" in the last 72 hours. Cardiac Enzymes Recent Labs    09/27/22 1506  CKTOTAL 97    BNP: BNP (last 3 results) Recent Labs    09/06/22 1611 09/20/22 1105 09/26/22 1318  BNP 1,807.9* 2,885.7* 2,617.1*    ProBNP (last 3 results) No results for input(s): "PROBNP" in the last 8760 hours.   D-Dimer No results for input(s): "DDIMER" in the last 72 hours. Hemoglobin A1C No results for input(s): "HGBA1C" in the last 72 hours. Fasting Lipid Panel No results for input(s): "CHOL", "HDL", "LDLCALC", "TRIG", "CHOLHDL", "LDLDIRECT" in the last 72 hours. Thyroid Function Tests Recent Labs    09/26/22 1859  TSH 2.005    Other results:   Imaging    DG Chest Port 1 View  Result Date: 09/28/2022 CLINICAL DATA:  PICC placement EXAM: PORTABLE CHEST 1 VIEW  COMPARISON:  09/26/2022 FINDINGS: Interim placement of left upper extremity central venous catheter with tip projecting over the upper SVC. Low lung volumes. Cardiomegaly. Patchy basilar opacities likely atelectasis. No pneumothorax. IMPRESSION: Interim placement of left upper extremity central venous catheter with tip projecting over the upper SVC. Hypoventilatory changes with cardiomegaly and patchy basilar atelectasis. Electronically Signed   By: Jasmine Pang M.D.   On: 09/28/2022 20:55   CARDIAC CATHETERIZATION  Result Date: 09/28/2022 1. Markedly elevated right heart filling pressures with low but not markedly low PAPi.  2. Mildly elevated PCWP 3. Severe pulmonary arterial hypertension. 4. Low cardiac output.   [place] EKG SITE RITE  Result Date: 09/28/2022 If Site Rite image not attached, placement could not be confirmed due to current cardiac rhythm.    Medications:     Scheduled Medications:  aspirin EC  81 mg Oral Daily   atorvastatin  40 mg Oral Daily   Chlorhexidine Gluconate Cloth  6 each Topical Daily   dorzolamide-timolol  1 drop Both Eyes BID   famotidine  20 mg Oral Daily   insulin aspart  0-15 Units Subcutaneous TID WC   loratadine  10 mg Oral Daily   metoprolol tartrate  25 mg Oral BID   sildenafil  20 mg Oral TID   sodium chloride flush  10-40 mL Intracatheter Q12H   sodium chloride flush  3 mL Intravenous Q12H   sodium chloride flush  3 mL Intravenous Q12H    Infusions:  sodium chloride     furosemide (LASIX) 200 mg in dextrose 5 % 100 mL (2 mg/mL) infusion 12 mg/hr (09/29/22 0601)   heparin 1,250 Units/hr (09/29/22 0541)   milrinone 0.125 mcg/kg/min (09/29/22 0541)    PRN Medications: acetaminophen **OR** acetaminophen, hydrALAZINE, loperamide, ondansetron **OR** ondansetron (ZOFRAN) IV, mouth rinse, sodium chloride flush    Patient Profile     Sandra Schneider is a 67 year old with a history of CAD (NSTEMI 06/2012 w/ PCI to LAD and Lcx) LVEF 55% at that time,  OSA, HTN, hyperlipidemia, hx of breast ca s/p lumpectomy and HFpEF.    Admitting with A/C HFpEF--RV Failure and new onset Atrial Flutter.   Assessment/Plan   1. RV failure/pulmonary hypertension:  RHC (9/23) with RA 30, RV 107/20-30, PA 107/48 (69), unable to wedge; LVEDP 15. AO 140/64. LVEDP ~16. Assuming a PCWP of 20, TPG would be 49 w/ PVR of 7.  Echo 3/24 with RV severely enlarged w/ severely reduced RV function.  Cardiac MRI in 5/24 with EF 75%, moderate RV dilation with RV EF 50%, moderate TR, mid-wall basal septal/basal inferolateral LGE.  Could be seen with cardiac sarcoidosis (  vs prior myocarditis).  CT chest showed RUL nodule (negative on PET) and mediastinal LAN.   She is volume overloaded on exam.  V/Q scan not suggestive of chronic PEs.  She has been on sildenafil 20 tid as outpatient. RHC done 5/16 showed severe PAH with low CI 1.73, PAPi 1.5, RA pressure 32 (R>>L heart failure).  Possible group 1 with OSA contributing. Now on Milrinone 0.125. Co-ox pending. Subpar diuretic response thus far w/ lasix gtt, currently at 12/hr.  - Increase Lasix gtt to 15/hr - Metolazone 2.5 x 1  - Continue milrinone with low CO and high PA pressure. May need up titration pending co-ox level (watch HR with milrinone, in atrial flutter).  - Can continue current sildenafil.  - Sent autoimmune serologies.  - Needs eventual cardiac PET to assess for cardiac sarcoidosis.   - Severe OSA, needs CPAP. Will use in hospital.  2. Syncope: Suspect this was due to severe PH/RV failure.  No arrhythmias noted on telemetry.  3. Atrial flutter: New diagnosis.  V-rates mildly elevated low 100s.   - Continue heparin gtt.  - Will need TEE-DCCV, tentatively scheduled today but may need more diuresis first - Metoprolol low dose for now.  If HR rises on milrinone, can use amiodarone.  4. CAD: Remote PCI LAD and LCx.  - Continue ASA and statin.  5. CKD stage 3: creatinine lower at 2.07 => 1.91=>1.6.  Watch with  diuresis  Transfer to [facility]   Length of Stay: [address], PA-C  09/29/2022, 9:05 AM  Advanced Heart Failure Team Pager [phone] (M-F; 7a - 5p)  Please contact [facility] for night-coverage after hours (5p -7a ) and weekends on [email or web address]  Patient seen with PA, agree with the above note.   Successful TEE-DCCV initially today but then went back into atrial flutter with rate 100s.    CVP remains 24, creatinine trending down at 1.63.  Co-ox 56% on milrinone 0.125.   General: NAD Neck: JVP 16+, no thyromegaly or thyroid nodule.  Lungs: Clear to auscultation bilaterally with normal respiratory effort. CV: Nondisplaced PMI.  Heart regular S1/S2, no S3/S4, no murmur.  1+ edema to knees.  Abdomen: Soft, nontender, no hepatosplenomegaly, moderate distention.  Skin: Intact without lesions or rashes.  Neurologic: Alert and oriented x 3.  Psych: Normal affect. Extremities: No clubbing or cyanosis.  HEENT: Normal.   Patient needs more aggressive diuresis.  Will continue milrinone 0.125 and increase Lasix to 15 mg/hr.  She will get metolazone 2.5 and acetazolamide 250 mg today.    She is back in AFL after initially successful DCCV.  Will add amiodarone gtt and plan to repeat DCCV next week, ideally when she is off milrinone.   Sandra Schneider 09/29/2022 4:48 PM

## 2022-09-29 NOTE — Transfer of Care (Signed)
Immediate Anesthesia Transfer of Care Note  Patient: Sandra Schneider  Procedure(s) Performed: TRANSESOPHAGEAL ECHOCARDIOGRAM CARDIOVERSION  Patient Location: PACU  Anesthesia Type:MAC  Level of Consciousness: drowsy and patient cooperative  Airway & Oxygen Therapy: Patient Spontanous Breathing and Patient connected to nasal cannula oxygen  Post-op Assessment: Report given to RN and Post -op Vital signs reviewed and stable  Post vital signs: Reviewed and stable  Last Vitals:  Vitals Value Taken Time  BP    Temp    Pulse 79 09/29/22 1415  Resp    SpO2 92 % 09/29/22 1415  Vitals shown include unvalidated device data.  Last Pain:  Vitals:   09/29/22 1141  TempSrc: Temporal  PainSc:          Complications: No notable events documented.

## 2022-09-29 NOTE — Progress Notes (Signed)
Ok to use PICC per  Reuel Boom from IV team. Also made aware of small amt of bloody drainage on new dressing

## 2022-09-29 NOTE — CV Procedure (Signed)
Procedure: TEE  Indication: Atrial flutter  Sedation: Per anesthesiology  Findings: Please see echo section for full report.  Small LV with mild concentric LV hypertrophy.  EF 65-70%, no wall motion abnormalities.  Moderately dilated RV with moderate systolic dysfunction, D-shaped septum suggestive of RV pressure/volume overload.  Mild left atrial enlargement with no LA appendage thrombus, moderate right atrial enlargement.  Interatrial septum is shifted left.  No PFO/ASD by color doppler.  Trivial MR.  Moderate TR, peak RV-RA gradient 82 mmHg.  Trileaflet aortic valve with no stenosis or regurgitation.  Normal caliber thoracic aorta with mild plaque. Trivial pericardial effusion.   Proceed to DCCV.   Sandra Schneider 09/29/2022 2:04 PM

## 2022-09-29 NOTE — Progress Notes (Signed)
  Echocardiogram Echocardiogram Transesophageal has been performed.  Delcie Roch 09/29/2022, 2:11 PM

## 2022-09-29 NOTE — Progress Notes (Signed)
ANTICOAGULATION CONSULT NOTE  Pharmacy Consult for heparin Indication:  atrial flutter  No Known Allergies  Patient Measurements: Height: 5\' 5"  (165.1 cm) Weight: 128 kg (282 lb 1.6 oz) IBW/kg (Calculated) : 57 Heparin Dosing Weight: 89.1 kg   Vital Signs: Temp: 97.6 F (36.4 C) (05/17 0409) Temp Source: Oral (05/17 0409) BP: 132/81 (05/17 0409) Pulse Rate: 96 (05/17 0409)  Labs: Recent Labs    09/26/22 1318 09/26/22 1859 09/26/22 2034 09/27/22 0310 09/27/22 1506 09/28/22 0243 09/28/22 1450 09/28/22 1745 09/29/22 0500  HGB 12.5  --   --  12.0  --   --  11.9* 13.6  13.3 11.6*  HCT 40.0  --   --  37.5  --   --  38.4 40.0  39.0 37.3  PLT 250  --   --  234  --   --  203  --  196  LABPROT  --   --   --   --   --   --   --   --  16.9*  INR  --   --   --   --   --   --   --   --  1.4*  HEPARINUNFRC  --   --    < > 0.53  --  0.63  --   --  0.32  CREATININE 2.20*  --   --  2.07*  --  1.91*  --   --  1.63*  CKTOTAL  --   --   --   --  97  --   --   --   --   TROPONINIHS 48* 60*  --   --   --   --   --   --   --    < > = values in this interval not displayed.     Estimated Creatinine Clearance: 44.5 mL/min (A) (by C-G formula based on SCr of 1.63 mg/dL (H)).   Assessment: 62 yof presented with volume overload. Now found in Aflutter - no AC PTA.   Heparin resumed yesterday after RHC, level therapeutic this morning.  No overt bleeding or complications noted.  Goal of Therapy:  Heparin level 0.3-0.7 units/ml Monitor platelets by anticoagulation protocol: Yes   Plan:  Continue heparin drip at 1250 units/hr Daily heparin level, CBC Monitor for signs/symptoms of bleeding  Reece Leader, Loura Back, BCPS, [facility] Clinical Pharmacist  09/29/2022 9:08 AM   [facility] pharmacy phone numbers are listed on [email or web address]

## 2022-09-29 NOTE — Anesthesia Postprocedure Evaluation (Signed)
Anesthesia Post Note  Patient: Sandra Schneider  Procedure(s) Performed: TRANSESOPHAGEAL ECHOCARDIOGRAM CARDIOVERSION     Patient location during evaluation: PACU Anesthesia Type: MAC Level of consciousness: awake Pain management: pain level controlled Vital Signs Assessment: post-procedure vital signs reviewed and stable Respiratory status: spontaneous breathing, nonlabored ventilation and respiratory function stable Cardiovascular status: stable and blood pressure returned to baseline Postop Assessment: no apparent nausea or vomiting Anesthetic complications: no   No notable events documented.  Last Vitals:  Vitals:   09/29/22 1141 09/29/22 1416  BP: (!) 172/94   Pulse: 96   Resp:    Temp: 36.8 C 36.9 C  SpO2: 91%     Last Pain:  Vitals:   09/29/22 1416  TempSrc: Temporal  PainSc:                  Linton Rump

## 2022-09-29 NOTE — Progress Notes (Signed)
Progress Note   Patient: Sandra Schneider WRU:[id number] DOB: 1956/04/13 DOA: 09/26/2022     3 DOS: the patient was seen and examined on 09/29/2022   Brief hospital course: Mrs. Puleio was admitted to the hospital with the working diagnosis of heart failure exacerbation.   67 yo female with the past medical history of heart failure, CKD stage 3b, coronary artery disease, breast cancer, T2DM, and hypertension who presented with dyspnea. Patient had several weeks or worsening dyspnea and lower extremity edema. Out patient follow up with cardiology on the day of admission she was found volume overloaded and recommended ER evaluation. On her initial physical examination her blood pressure was 120/80, HR 80, RR 23 and 02 saturation 95%, lungs with no wheezing or rales, heart with S1 and S2 present, irregularly irregular with no gallops, rubs or murmurs, abdomen with no distention, and bilateral lower extremity edema.   Na 137, K 4,5 Cl 98, bicarbonate 26, glucose 153, bun 59, cr 2.2  BNP 2,671  High sensitive troponin 48 and 60  Wbc 7,0 hgb 12,5 plt 250   Chest radiograph with cardiomegaly, bilateral hilar vascular congestion, bilateral interstitial infiltrates. Right upper lobe nodule.   EKG 80 bpm, normal axis, normal intervals, atrial flutter (typical) 3:1 conduction, with no significant ST segment changes, negative T wave lead II, III, AvF, V4 to V6.   05/14 bradycardic episode, 50 bpm, with decreased responsiveness. Improved with chest compression. (No cardiac arrest).  05/16 right heart catheterization with severe pulmonary hypertension.  05/17 TEE cardioversion.   Assessment and Plan: * New onset atrial flutter (HCC) Rate controlled atrial flutter.  Anticoagulation with heparin drip TEE cardioversion today,.   Acute on chronic diastolic CHF (congestive heart failure) (HCC) Echocardiogram with preserved LV systolic function EF >75%, interventricular septum is flattened in  systole and diastole. RV with severe enlargement, RV systolic function with moderate reduction, RVSP 45.9 mmHg. TR mild to moderate.   Acute on chronic core pulmonale.  Pulmonary hypertension (type 4 or 5).  RV failure   V/Q scan with low probability for pulmonary embolism.   05/16 cardiac catheterization  PA 93/44 mean 66 PCWP mean 19 Cardiac output 3,9 and index 1,73 PVR 11,8   Mainly precapillary pulmonary hypertension, with mild left heart failure.   SV02 55,9 Urine output 1,340 ml Systolic blood pressure 150 to 170,. Had hypotension last night down to systolic 75 mmHg.   Placed on furosemide drip at 15 ml per hr for diuresis.  05/16, 05/17 Metolazone 2,5 mg  Inotropic support with milrinone for low output heart failure.   Sildenafil for pulmonary hypertension.   Stage 4 chronic kidney disease (HCC) AKI,   Renal function today with serum cr at 1,63 with K at 4,4 and serum bicarbonate at 27. Na is 137   Plan to continue diuresis with furosemide drip Follow up renal function and electrolytes in am.   Essential (primary) hypertension Positive hypotension, now on milrinone drip.   Type 2 diabetes mellitus with hyperglycemia (HCC) Uncontrolled T2Dm with hyperglycemia.   Continue glucose cover and monitoring with insulin sliding scale.    Malignant neoplasm of upper-outer quadrant of right breast in female, estrogen receptor positive (HCC) Follow up as outpatient.   Class 3 obesity (HCC) Calculated BMI is 47,3   Solitary pulmonary nodule on lung CT Follow up as outpatient.         Subjective: Patient is feeling better, dyspnea is improving, she had TEE cardioversion   Physical Exam: Vitals:  09/29/22 1445 09/29/22 1450 09/29/22 1455 09/29/22 1500  BP: (!) 153/76     Pulse: (!) 105 (!) 103 97 82  Resp: 15 (!) 25 14 (!) 26  Temp:      TempSrc:      SpO2: 91% 95% 95% 96%  Weight:      Height:       Neurology awake and alert ENT with mild  pallor Cardiovascular with S1 and S2 present and regular with no gallops, no rubs mild JVD Trace lower extremity edema Respiratory with rales bilaterally with no wheezing Abdomen with no distention  Data Reviewed:    Family Communication: no family at the bedside   Disposition: Status is: Inpatient Remains inpatient appropriate because: heart failure   Planned Discharge Destination: Home      Author: Coralie Keens, MD 09/29/2022 3:08 PM  For on call review [email or web address].

## 2022-09-29 NOTE — Progress Notes (Signed)
Pt refused CPAP for tonight.  

## 2022-09-29 NOTE — Progress Notes (Signed)
  Pt has order to transfer to [facility]. Reported called to RN at receiving unit and patient was transferred.

## 2022-09-29 NOTE — Interval H&P Note (Signed)
History and Physical Interval Note:  09/29/2022 1:32 PM  Sandra Schneider  has presented today for surgery, with the diagnosis of afib.  The various methods of treatment have been discussed with the patient and family. After consideration of risks, benefits and other options for treatment, the patient has consented to  Procedure(s): TRANSESOPHAGEAL ECHOCARDIOGRAM (N/A) CARDIOVERSION (N/A) as a surgical intervention.  The patient's history has been reviewed, patient examined, no change in status, stable for surgery.  I have reviewed the patient's chart and labs.  Questions were answered to the patient's satisfaction.     [name] [employer]

## 2022-09-29 NOTE — Care Management Important Message (Signed)
Important Message  Patient Details  Name: Sandra Schneider MRN: [id number] Date of Birth: 1956-03-10   Medicare Important Message Given:  Yes     Renie Ora 09/29/2022, 8:39 AM

## 2022-09-29 NOTE — Procedures (Signed)
Electrical Cardioversion Procedure Note Sandra Schneider [id number] February 12, 1956  Procedure: Electrical Cardioversion Indications:Atrial flutter  Procedure Details Consent: Risks of procedure as well as the alternatives and risks of each were explained to the (patient/caregiver).  Consent for procedure obtained. Time Out: Verified patient identification, verified procedure, site/side was marked, verified correct patient position, special equipment/implants available, medications/allergies/relevent history reviewed, required imaging and test results available.  Performed  Patient placed on cardiac monitor, pulse oximetry, supplemental oxygen as necessary.  Sedation given:  Propofol per anesthesiology Pacer pads placed anterior and posterior chest.  Cardioverted 1 time(s).  Cardioverted at 150J.  Evaluation Findings: Post procedure EKG shows: NSR Complications: None Patient did tolerate procedure well.   Sandra Schneider 09/29/2022, 2:04 PM

## 2022-09-30 DIAGNOSIS — N184 Chronic kidney disease, stage 4 (severe): Secondary | ICD-10-CM | POA: Diagnosis not present

## 2022-09-30 DIAGNOSIS — I5033 Acute on chronic diastolic (congestive) heart failure: Secondary | ICD-10-CM | POA: Diagnosis not present

## 2022-09-30 DIAGNOSIS — I4892 Unspecified atrial flutter: Secondary | ICD-10-CM | POA: Diagnosis not present

## 2022-09-30 DIAGNOSIS — I10 Essential (primary) hypertension: Secondary | ICD-10-CM | POA: Diagnosis not present

## 2022-09-30 DIAGNOSIS — I50810 Right heart failure, unspecified: Secondary | ICD-10-CM | POA: Diagnosis not present

## 2022-09-30 LAB — BASIC METABOLIC PANEL
Anion gap: 10 (ref 5–15)
Anion gap: 12 (ref 5–15)
BUN: 49 mg/dL — ABNORMAL HIGH (ref 8–23)
BUN: 49 mg/dL — ABNORMAL HIGH (ref 8–23)
CO2: 28 mmol/L (ref 22–32)
CO2: 29 mmol/L (ref 22–32)
Calcium: 9.2 mg/dL (ref 8.9–10.3)
Calcium: 9.4 mg/dL (ref 8.9–10.3)
Chloride: 96 mmol/L — ABNORMAL LOW (ref 98–111)
Chloride: 91 mmol/L — ABNORMAL LOW (ref 98–111)
Creatinine, Ser: 1.95 mg/dL — ABNORMAL HIGH (ref 0.44–1.00)
Creatinine, Ser: 1.93 mg/dL — ABNORMAL HIGH (ref 0.44–1.00)
GFR, Estimated: 28 mL/min — ABNORMAL LOW (ref >60)
GFR, Estimated: 28 mL/min — ABNORMAL LOW (ref >60)
Glucose, Bld: 162 mg/dL — ABNORMAL HIGH (ref 70–99)
Glucose, Bld: 242 mg/dL — ABNORMAL HIGH (ref 70–99)
Potassium: 3.7 mmol/L (ref 3.5–5.1)
Potassium: 3.6 mmol/L (ref 3.5–5.1)
Sodium: 134 mmol/L — ABNORMAL LOW (ref 135–145)
Sodium: 132 mmol/L — ABNORMAL LOW (ref 135–145)

## 2022-09-30 LAB — CBC
HCT: 35.1 % — ABNORMAL LOW (ref 36.0–46.0)
Hemoglobin: 11.2 g/dL — ABNORMAL LOW (ref 12.0–15.0)
MCH: 31.5 pg (ref 26.0–34.0)
MCHC: 31.9 g/dL (ref 30.0–36.0)
MCV: 98.9 fL (ref 80.0–100.0)
Platelets: 183 10*3/uL (ref 150–400)
RBC: 3.55 MIL/uL — ABNORMAL LOW (ref 3.87–5.11)
RDW: 15.2 % (ref 11.5–15.5)
WBC: 5.9 10*3/uL (ref 4.0–10.5)
nRBC: 0 % (ref 0.0–0.2)

## 2022-09-30 LAB — GLUCOSE, CAPILLARY
Glucose-Capillary: 147 mg/dL — ABNORMAL HIGH (ref 70–99)
Glucose-Capillary: 216 mg/dL — ABNORMAL HIGH (ref 70–99)
Glucose-Capillary: 204 mg/dL — ABNORMAL HIGH (ref 70–99)
Glucose-Capillary: 141 mg/dL — ABNORMAL HIGH (ref 70–99)

## 2022-09-30 LAB — ANTINUCLEAR ANTIBODIES, IFA: ANA Ab, IFA: NEGATIVE

## 2022-09-30 LAB — COOXEMETRY PANEL
Carboxyhemoglobin: 1.6 % — ABNORMAL HIGH (ref 0.5–1.5)
Methemoglobin: 1 % (ref 0.0–1.5)
O2 Saturation: 58.6 %
Total hemoglobin: 11.5 g/dL — ABNORMAL LOW (ref 12.0–16.0)

## 2022-09-30 LAB — MRSA NEXT GEN BY PCR, NASAL: MRSA by PCR Next Gen: NOT DETECTED

## 2022-09-30 MED ORDER — ACETAZOLAMIDE 250 MG PO TABS
250.0000 mg | ORAL_TABLET | Freq: Two times a day (BID) | ORAL | Status: AC
  Administered 2022-09-30 (×2): 250 mg via ORAL
  Filled 2022-09-30 (×2): qty 1

## 2022-09-30 MED ORDER — POTASSIUM CHLORIDE CRYS ER 20 MEQ PO TBCR
40.0000 meq | EXTENDED_RELEASE_TABLET | Freq: Three times a day (TID) | ORAL | Status: AC
  Administered 2022-09-30 (×3): 40 meq via ORAL
  Filled 2022-09-30 (×3): qty 2

## 2022-09-30 MED ORDER — POLYETHYLENE GLYCOL 3350 17 G PO PACK
17.0000 g | PACK | Freq: Every day | ORAL | Status: DC
  Administered 2022-09-30 – 2022-10-02 (×3): 17 g via ORAL
  Filled 2022-09-30 (×7): qty 1

## 2022-09-30 MED ORDER — APIXABAN 5 MG PO TABS
5.0000 mg | ORAL_TABLET | Freq: Two times a day (BID) | ORAL | Status: DC
  Administered 2022-09-30 – 2022-10-07 (×15): 5 mg via ORAL
  Filled 2022-09-30 (×15): qty 1

## 2022-09-30 MED ORDER — METOLAZONE 2.5 MG PO TABS
5.0000 mg | ORAL_TABLET | Freq: Once | ORAL | Status: AC
  Administered 2022-09-30: 5 mg via ORAL
  Filled 2022-09-30: qty 2

## 2022-09-30 NOTE — Progress Notes (Signed)
Progress Note   Patient: Sandra Schneider UJW:[id number] DOB: 1955-06-04 DOA: 09/26/2022     4 DOS: the patient was seen and examined on 09/30/2022   Brief hospital course: Mrs. Hubbs was admitted to the hospital with the working diagnosis of heart failure exacerbation.   67 yo female with the past medical history of heart failure, CKD stage 3b, coronary artery disease, breast cancer, T2DM, and hypertension who presented with dyspnea. Patient had several weeks or worsening dyspnea and lower extremity edema. Out patient follow up with cardiology on the day of admission she was found volume overloaded and recommended ER evaluation. On her initial physical examination her blood pressure was 120/80, HR 80, RR 23 and 02 saturation 95%, lungs with no wheezing or rales, heart with S1 and S2 present, irregularly irregular with no gallops, rubs or murmurs, abdomen with no distention, and bilateral lower extremity edema.   Na 137, K 4,5 Cl 98, bicarbonate 26, glucose 153, bun 59, cr 2.2  BNP 2,671  High sensitive troponin 48 and 60  Wbc 7,0 hgb 12,5 plt 250   Chest radiograph with cardiomegaly, bilateral hilar vascular congestion, bilateral interstitial infiltrates. Right upper lobe nodule.   EKG 80 bpm, normal axis, normal intervals, atrial flutter (typical) 3:1 conduction, with no significant ST segment changes, negative T wave lead II, III, AvF, V4 to V6.   05/14 bradycardic episode, 50 bpm, with decreased responsiveness/ syncope. Improved with chest compression. (No cardiac arrest).  05/16 right heart catheterization with severe pulmonary hypertension.  05/17 TEE cardioversion.  05/18 patient back in atrial flutter, she has been placed on milrinone for RV failure with low output.   Assessment and Plan: * New onset atrial flutter (HCC) Rate controlled atrial flutter.  Anticoagulation with heparin drip 05/18 TEE  Patient is back in atrial flutter with variable block, rate is controlled  with amiodarone.  70's beats per minute range, personally reviewed telemetry.    Acute on chronic diastolic CHF (congestive heart failure) (HCC) Echocardiogram with preserved LV systolic function EF >75%, interventricular septum is flattened in systole and diastole. RV with severe enlargement, RV systolic function with moderate reduction, RVSP 45.9 mmHg. TR mild to moderate.   Acute on chronic core pulmonale.  Pulmonary hypertension (type 4 or 5).  RV failure   V/Q scan with low probability for pulmonary embolism.   05/16 cardiac catheterization  PA 93/44 mean 66 PCWP mean 19 Cardiac output 3,9 and index 1,73 PVR 11,8   Mainly precapillary pulmonary hypertension, with mild left heart failure.   SV02 58.6 Urine output 1, 675 ml Systolic blood pressure 187 to 156/ diastolic blood pressure 91 to 562   Sequential nephron blockade with:  Furosemide drip at 15 ml per hr.  PO Acetazolamide for 2 doses.  05/16, 05/17,05/18  Metolazone 2,5 mg   Inotropic support with milrinone for low output heart failure.   Sildenafil for pulmonary hypertension.   Stage 4 chronic kidney disease (HCC) AKI, hyponatremia.   Today renal function with serum cr at 1,95 with K at 3,7 and serum bicarbonate at 28. Na 134.   Continue sequential nephron blockade with acetazolamide, furosemide and metolazone.  Follow up renal function and electrolytes in am.   Essential (primary) hypertension Blood pressure has improved, continue milrinone for inotropic support.   Type 2 diabetes mellitus with hyperglycemia (HCC) Uncontrolled T2Dm with hyperglycemia.  Fasting glucose today is 162 mg/dl. Continue glucose cover and monitoring with insulin sliding scale.    Malignant neoplasm of upper-outer  quadrant of right breast in female, estrogen receptor positive (HCC) Follow up as outpatient.   Class 3 obesity (HCC) Calculated BMI is 47,3   Solitary pulmonary nodule on lung CT Follow up as outpatient.          Subjective: patient is feeling better, dyspnea has bee improving,   Physical Exam: Vitals:   09/30/22 0501 09/30/22 0655 09/30/22 0718 09/30/22 1113  BP:   (!) 187/91 (!) 156/104  Pulse:   87 94  Resp:   (!) 21 14  Temp:   98 F (36.7 C) 97.6 F (36.4 C)  TempSrc: Oral  Oral Oral  SpO2:   (!) 83%   Weight: 127.3 kg 126.7 kg    Height:       Neurology awake and alert ENT with mild pallor Cardiovascular with S1 and S2 present, irregular with no gallops, rubs or murmurs Moderate JVD Trace lower extremity edema Respiratory with no rales or wheezing, no rhonchi Abdomen with no distention  Data Reviewed:    Family Communication: no family at the bedside   Disposition: Status is: Inpatient Remains inpatient appropriate because: heart failure with low output   Planned Discharge Destination: Home    Author: Coralie Keens, MD 09/30/2022 11:37 AM  For on call review [email or web address].

## 2022-09-30 NOTE — Progress Notes (Signed)
Advanced Heart Failure Rounding Note  PCP-Cardiologist: Dina Rich, MD   Subjective:   Admitted with marked volume overload and new atrial flutter. 5/15 had syncopal episode. Heart rate 50. Unresponsive. Had brief CPR.  5/16 RHC markedly elevated RH filling pressures w/ low but not markedly low PAPi, mildly elevated PCWP, severe PAH and low CO w/ CI of 1.73 =>started on milrinone and Lasix gtt  5/17 TEE-guided DCCV to NSR but atrial flutter recurred later that day.   On Milrinone 0.125 + lasix gtt at 15 mg/hr. UOP poorly recorded on 6E, now on 2C.  Weight down 3 lbs.  Co-ox 59%, CVP 21.   Creatinine 2.07>>1.91>>1.63>>1.95.   She remains in atrial flutter rate 90s-100s today, now on amiodarone gtt + heparin gtt.    RHC 5/16 Hemodynamics (mmHg) RA 32 RV 95/32 PA 93/44, mean 66 PCWP mean 19 Oxygen saturations: PA 49% AO 96% Cardiac Output (Fick) 3.98  Cardiac Index (Fick) 1.73 PVR 11.8 PAPi 1.5   VQ -low probability for PE.   TEE - LV EF 60-65%, D-shaped septum, moderate RV dilation with moderate RV dysfunction, mod-severe TR.    Objective:   Weight Range: 126.7 kg Body mass index is 46.48 kg/m.   Vital Signs:   Temp:  [97.5 F (36.4 C)-98.5 F (36.9 C)] 98 F (36.7 C) (05/18 0718) Pulse Rate:  [68-105] 87 (05/18 0718) Resp:  [14-26] 21 (05/18 0718) BP: (92-187)/(25-94) 187/91 (05/18 0718) SpO2:  [83 %-100 %] 83 % (05/18 0718) Weight:  [126.7 kg-127.9 kg] 126.7 kg (05/18 0655) Last BM Date : 09/25/22  Weight change: Filed Weights   09/29/22 1141 09/30/22 0501 09/30/22 0655  Weight: 127.9 kg 127.3 kg 126.7 kg    Intake/Output:   Intake/Output Summary (Last 24 hours) at 09/30/2022 0905 Last data filed at 09/30/2022 0502 Gross per 24 hour  Intake 1015.2 ml  Output 1675 ml  Net -659.8 ml      Physical Exam   CVP 21 General: NAD Neck: JVP 16+, no thyromegaly or thyroid nodule.  Lungs: Clear to auscultation bilaterally with normal  respiratory effort. CV: Nondisplaced PMI.  Heart irregular S1/S2, no S3/S4, no murmur.  1+ ankle edema.  Abdomen: Soft, nontender, no hepatosplenomegaly, moderate distention.  Skin: Intact without lesions or rashes.  Neurologic: Alert and oriented x 3.  Psych: Normal affect. Extremities: No clubbing or cyanosis.  HEENT: Normal.    Telemetry   A flutter 90s-100s, personally reviewed   EKG    N./A  Labs    CBC Recent Labs    09/28/22 1450 09/28/22 1745 09/29/22 0500  WBC 6.2  --  6.6  HGB 11.9* 13.6  13.3 11.6*  HCT 38.4 40.0  39.0 37.3  MCV 102.1*  --  101.1*  PLT 203  --  196   Basic Metabolic Panel Recent Labs    40/98/11 0243 09/28/22 1745 09/29/22 0500 09/30/22 0525  NA 136   < > 137 134*  K 3.7   < > 4.4 3.7  CL 98  --  98 96*  CO2 25  --  27 28  GLUCOSE 148*  --  138* 162*  BUN 54*  --  47* 49*  CREATININE 1.91*  --  1.63* 1.95*  CALCIUM 9.6  --  9.4 9.2  MG 2.2  --   --   --    < > = values in this interval not displayed.   Liver Function Tests No results for input(s): "AST", "ALT", "ALKPHOS", "  BILITOT", "PROT", "ALBUMIN" in the last 72 hours. No results for input(s): "LIPASE", "AMYLASE" in the last 72 hours. Cardiac Enzymes Recent Labs    09/27/22 1506  CKTOTAL 97    BNP: BNP (last 3 results) Recent Labs    09/06/22 1611 09/20/22 1105 09/26/22 1318  BNP 1,807.9* 2,885.7* 2,617.1*    ProBNP (last 3 results) No results for input(s): "PROBNP" in the last 8760 hours.   D-Dimer No results for input(s): "DDIMER" in the last 72 hours. Hemoglobin A1C No results for input(s): "HGBA1C" in the last 72 hours. Fasting Lipid Panel No results for input(s): "CHOL", "HDL", "LDLCALC", "TRIG", "CHOLHDL", "LDLDIRECT" in the last 72 hours. Thyroid Function Tests No results for input(s): "TSH", "T4TOTAL", "T3FREE", "THYROIDAB" in the last 72 hours.  Invalid input(s): "FREET3"   Other results:   Imaging    ECHO TEE  Result Date:  09/29/2022    TRANSESOPHOGEAL ECHO REPORT   Patient Name:   EHLANA SARCIA Date of Exam: 09/29/2022 Medical Rec #:  [id number]              Height:       65.0 in Accession #:    [id number]             Weight:       282.0 lb Date of Birth:  09-07-1955               BSA:          2.289 m Patient Age:    67 years               BP:           183/77 mmHg Patient Gender: F                      HR:           112 bpm. Exam Location:  Inpatient Procedure: Transesophageal Echo, Color Doppler and Cardiac Doppler Indications:     atrial flutter  History:         Patient has prior history of Echocardiogram examinations, most                  recent 09/08/2022. CHF, chronic kidney disease; Risk                  Factors:Hypertension, Diabetes, Dyslipidemia and Sleep Apnea.  Sonographer:     Delcie Roch RDCS Referring Phys:  [id number] Selina Tapper Diagnosing Phys: Wilfred Lacy PROCEDURE: After discussion of the risks and benefits of a TEE, an informed consent was obtained from the patient. The transesophogeal probe was passed without difficulty through the esophogus of the patient. Imaged were obtained with the patient in a left lateral decubitus position. Sedation performed by different physician. The patient was monitored while under deep sedation. Anesthestetic sedation was provided intravenously by Anesthesiology: 200mg  of Propofol. The patient developed no complications during the procedure. A direct current cardioversion was performed.  IMPRESSIONS  1. Left ventricular ejection fraction, by estimation, is 60 to 65%. The left ventricle has normal function. The left ventricle has no regional wall motion abnormalities.  2. D-shaped septum suggesting RV pressure/volume overload. Peak RV-RA gradient 83 mmHg. Right ventricular systolic function is moderately reduced. The right ventricular size is moderately enlarged. Mildly increased right ventricular wall thickness.  3. No left atrial/left atrial appendage thrombus was  detected.  4. Right atrial size was mild to moderately dilated. Interatrial septum was shifted to  the left.  5. No PFO or ASD by color doppler.  6. The tricuspid valve is abnormal. Tricuspid valve regurgitation is moderate to severe.  7. The aortic valve is tricuspid. Aortic valve regurgitation is not visualized. No aortic stenosis is present.  8. The mitral valve is normal in structure. Trivial mitral valve regurgitation. No evidence of mitral stenosis.  9. Normal caliber thoracic aorta with mild plaque. FINDINGS  Left Ventricle: Left ventricular ejection fraction, by estimation, is 60 to 65%. The left ventricle has normal function. The left ventricle has no regional wall motion abnormalities. The left ventricular internal cavity size was small. Right Ventricle: D-shaped septum suggesting RV pressure/volume overload. Peak RV-RA gradient 83 mmHg. The right ventricular size is moderately enlarged. Mildly increased right ventricular wall thickness. Right ventricular systolic function is moderately reduced. Left Atrium: Left atrial size was normal in size. No left atrial/left atrial appendage thrombus was detected. Right Atrium: Right atrial size was mild to moderately dilated. Pericardium: Trivial pericardial effusion is present. Mitral Valve: The mitral valve is normal in structure. Trivial mitral valve regurgitation. No evidence of mitral valve stenosis. Tricuspid Valve: The tricuspid valve is abnormal. Tricuspid valve regurgitation is moderate to severe. Aortic Valve: The aortic valve is tricuspid. Aortic valve regurgitation is not visualized. No aortic stenosis is present. Pulmonic Valve: The pulmonic valve was normal in structure. Pulmonic valve regurgitation is not visualized. Aorta: Normal caliber thoracic aorta with mild plaque. The aortic root is normal in size and structure. IAS/Shunts: No PFO or ASD by color doppler. Osa Fogarty [employer] Electronically signed by Wilfred Lacy Signature Date/Time:  09/29/2022/5:38:06 PM    Final    EP STUDY  Result Date: 09/29/2022 See surgical note for result.    Medications:     Scheduled Medications:  acetaZOLAMIDE  250 mg Oral BID   aspirin EC  81 mg Oral Daily   atorvastatin  40 mg Oral Daily   Chlorhexidine Gluconate Cloth  6 each Topical Daily   dorzolamide-timolol  1 drop Both Eyes BID   famotidine  20 mg Oral Daily   insulin aspart  0-15 Units Subcutaneous TID WC   loratadine  10 mg Oral Daily   metolazone  5 mg Oral Once   potassium chloride  40 mEq Oral TID   sildenafil  20 mg Oral TID   sodium chloride flush  10-40 mL Intracatheter Q12H   sodium chloride flush  3 mL Intravenous Q12H   sodium chloride flush  3 mL Intravenous Q12H    Infusions:  amiodarone 30 mg/hr (09/30/22 0646)   furosemide (LASIX) 200 mg in dextrose 5 % 100 mL (2 mg/mL) infusion 15 mg/hr (09/29/22 2300)   heparin 1,250 Units/hr (09/29/22 1804)   milrinone 0.125 mcg/kg/min (09/29/22 1342)    PRN Medications: acetaminophen **OR** acetaminophen, hydrALAZINE, loperamide, ondansetron **OR** ondansetron (ZOFRAN) IV, mouth rinse, sodium chloride flush    Patient Profile     Ms Graff is a 66 year old with a history of CAD (NSTEMI 06/2012 w/ PCI to LAD and Lcx) LVEF 55% at that time, OSA, HTN, hyperlipidemia, hx of breast ca s/p lumpectomy and HFpEF.    Admitting with A/C HFpEF--RV Failure and new onset Atrial Flutter.   Assessment/Plan   1. RV failure/pulmonary hypertension:  RHC (9/23) with RA 30, RV 107/20-30, PA 107/48 (69), unable to wedge; LVEDP 15. AO 140/64. LVEDP ~16. Assuming a PCWP of 20, TPG would be 49 w/ PVR of 7.  Echo 3/24 with RV severely enlarged w/ severely  reduced RV function.  Cardiac MRI in 5/24 with EF 75%, moderate RV dilation with RV EF 50%, moderate TR, mid-wall basal septal/basal inferolateral LGE.  Could be seen with cardiac sarcoidosis (vs prior myocarditis).  CT chest showed RUL nodule (negative on PET) and mediastinal LAN.   V/Q scan not suggestive of chronic PEs.  She has been on sildenafil 20 tid as outpatient. RHC done 5/16 showed severe PAH with low CI 1.73, PAPi 1.5, RA pressure 32 (R>>L heart failure). TEE this admission with LV EF 60-65%, D-shaped septum, moderate RV dilation with moderate RV dysfunction, mod-severe TR.  Possible group 1 PH with OSA contributing. Now on Milrinone 0.125 with co-ox 59% and CVP 21 on Lasix gtt 15 mg/hr, had metolazone yesterday.  Diuresis picking up, still has a long way to go.  - Increase milrinone to 0.25 mcg/kg/min for RV support.  - Continue Lasix gtt 15 mg/hr.  - Metolazone 5 mg x 1 today.  - Acetazolamide 250 mg bid x 2 doses today.  - Replace K aggressively and repeat BMET in pm.  - Can continue current sildenafil.  - Sent autoimmune serologies => RF elevated, ANA/anti-centromere/SCL-70 negative.  - Needs eventual cardiac PET to assess for cardiac sarcoidosis.   - Severe OSA, needs CPAP. Refused last night.  2. Syncope: Suspect this was due to severe PH/RV failure.  No arrhythmias noted on telemetry.  3. Atrial flutter: New diagnosis.  V-rates mildly elevated low 100s.  TEE-guided DCCV to NSR yesterday but AFL recurred.  Amiodarone gtt started for rate control and to keep her in NSR after repeat DCCV.   - Will need to get her fully diuresed and off milrinone, then repeat DCCV.  - Stop heparin gtt and start apixaban.  4. CAD: Remote PCI LAD and LCx.  - Continue statin.  - Stop ASA with anticoagulation.  5. CKD stage 3: creatinine 2.07 => 1.91=>1.6 => 1.96.  Watch with diuresis.   Marca Ancona 09/30/2022 9:05 AM

## 2022-09-30 NOTE — Progress Notes (Signed)
ANTICOAGULATION CONSULT NOTE  Pharmacy Consult for heparin > switch to Eliquis Indication:  atrial flutter  No Known Allergies  Patient Measurements: Height: 5\' 5"  (165.1 cm) Weight: 126.7 kg (279 lb 4.8 oz) IBW/kg (Calculated) : 57 Heparin Dosing Weight: 89.1 kg   Vital Signs: Temp: 97.6 F (36.4 C) (05/18 1113) Temp Source: Oral (05/18 1113) BP: 156/104 (05/18 1113) Pulse Rate: 94 (05/18 1113)  Labs: Recent Labs    09/27/22 1506 09/28/22 0243 09/28/22 1450 09/28/22 1450 09/28/22 1745 09/29/22 0500 09/30/22 0525 09/30/22 1004  HGB  --   --  11.9*   < > 13.6  13.3 11.6*  --  11.2*  HCT  --   --  38.4   < > 40.0  39.0 37.3  --  35.1*  PLT  --   --  203  --   --  196  --  183  LABPROT  --   --   --   --   --  16.9*  --   --   INR  --   --   --   --   --  1.4*  --   --   HEPARINUNFRC  --  0.63  --   --   --  0.32  --   --   CREATININE  --  1.91*  --   --   --  1.63* 1.95*  --   CKTOTAL 97  --   --   --   --   --   --   --    < > = values in this interval not displayed.     Estimated Creatinine Clearance: 37 mL/min (A) (by C-G formula based on SCr of 1.95 mg/dL (H)).   Assessment: 75 yof presented with volume overload. Now found in Aflutter - no AC PTA.   Heparin resumed yesterday after RHC,.  No overt bleeding or complications noted.  Pharmacy asked to change heparin to Eliquis today.  Goal of Therapy:  Heparin level 0.3-0.7 units/ml Monitor platelets by anticoagulation protocol: Yes   Plan:  Stop heparin gtt. Start Eliquis 5 mg po BID. Will educate patient prior to discharge.  [facility] pharmacy phone numbers are listed on [email or web address]  Reece Leader, Colon Flattery, BCCP Clinical Pharmacist  09/30/2022 2:28 PM   [facility] pharmacy phone numbers are listed on [email or web address]

## 2022-10-01 DIAGNOSIS — N184 Chronic kidney disease, stage 4 (severe): Secondary | ICD-10-CM | POA: Diagnosis not present

## 2022-10-01 DIAGNOSIS — I10 Essential (primary) hypertension: Secondary | ICD-10-CM | POA: Diagnosis not present

## 2022-10-01 DIAGNOSIS — I5033 Acute on chronic diastolic (congestive) heart failure: Secondary | ICD-10-CM | POA: Diagnosis not present

## 2022-10-01 DIAGNOSIS — I4892 Unspecified atrial flutter: Secondary | ICD-10-CM | POA: Diagnosis not present

## 2022-10-01 LAB — BASIC METABOLIC PANEL
Anion gap: 13 (ref 5–15)
Anion gap: 14 (ref 5–15)
BUN: 47 mg/dL — ABNORMAL HIGH (ref 8–23)
BUN: 46 mg/dL — ABNORMAL HIGH (ref 8–23)
CO2: 28 mmol/L (ref 22–32)
CO2: 28 mmol/L (ref 22–32)
Calcium: 9.3 mg/dL (ref 8.9–10.3)
Calcium: 9.1 mg/dL (ref 8.9–10.3)
Chloride: 92 mmol/L — ABNORMAL LOW (ref 98–111)
Chloride: 87 mmol/L — ABNORMAL LOW (ref 98–111)
Creatinine, Ser: 2.16 mg/dL — ABNORMAL HIGH (ref 0.44–1.00)
Creatinine, Ser: 2.05 mg/dL — ABNORMAL HIGH (ref 0.44–1.00)
GFR, Estimated: 24 mL/min — ABNORMAL LOW (ref >60)
GFR, Estimated: 26 mL/min — ABNORMAL LOW (ref >60)
Glucose, Bld: 196 mg/dL — ABNORMAL HIGH (ref 70–99)
Glucose, Bld: 322 mg/dL — ABNORMAL HIGH (ref 70–99)
Potassium: 4.3 mmol/L (ref 3.5–5.1)
Potassium: 3.5 mmol/L (ref 3.5–5.1)
Sodium: 133 mmol/L — ABNORMAL LOW (ref 135–145)
Sodium: 129 mmol/L — ABNORMAL LOW (ref 135–145)

## 2022-10-01 LAB — CBC
HCT: 36.1 % (ref 36.0–46.0)
Hemoglobin: 11.2 g/dL — ABNORMAL LOW (ref 12.0–15.0)
MCH: 31.2 pg (ref 26.0–34.0)
MCHC: 31 g/dL (ref 30.0–36.0)
MCV: 100.6 fL — ABNORMAL HIGH (ref 80.0–100.0)
Platelets: 177 10*3/uL (ref 150–400)
RBC: 3.59 MIL/uL — ABNORMAL LOW (ref 3.87–5.11)
RDW: 15.1 % (ref 11.5–15.5)
WBC: 7.3 10*3/uL (ref 4.0–10.5)
nRBC: 0 % (ref 0.0–0.2)

## 2022-10-01 LAB — COOXEMETRY PANEL
Carboxyhemoglobin: 1.7 % — ABNORMAL HIGH (ref 0.5–1.5)
Methemoglobin: 1 % (ref 0.0–1.5)
O2 Saturation: 61.9 %
Total hemoglobin: 11.8 g/dL — ABNORMAL LOW (ref 12.0–16.0)

## 2022-10-01 LAB — GLUCOSE, CAPILLARY
Glucose-Capillary: 167 mg/dL — ABNORMAL HIGH (ref 70–99)
Glucose-Capillary: 294 mg/dL — ABNORMAL HIGH (ref 70–99)
Glucose-Capillary: 179 mg/dL — ABNORMAL HIGH (ref 70–99)
Glucose-Capillary: 218 mg/dL — ABNORMAL HIGH (ref 70–99)

## 2022-10-01 MED ORDER — METOLAZONE 2.5 MG PO TABS
5.0000 mg | ORAL_TABLET | Freq: Once | ORAL | Status: AC
  Administered 2022-10-01: 5 mg via ORAL
  Filled 2022-10-01: qty 2

## 2022-10-01 MED ORDER — BENZONATATE 100 MG PO CAPS
200.0000 mg | ORAL_CAPSULE | Freq: Three times a day (TID) | ORAL | Status: DC | PRN
  Administered 2022-10-01 – 2022-10-02 (×3): 200 mg via ORAL
  Filled 2022-10-01 (×3): qty 2

## 2022-10-01 MED ORDER — ACETAZOLAMIDE 250 MG PO TABS
250.0000 mg | ORAL_TABLET | Freq: Two times a day (BID) | ORAL | Status: AC
  Administered 2022-10-01 (×2): 250 mg via ORAL
  Filled 2022-10-01 (×2): qty 1

## 2022-10-01 NOTE — Progress Notes (Signed)
Progress Note   Patient: Sandra Schneider ZOX:[id number] DOB: May 01, 1956 DOA: 09/26/2022     5 DOS: the patient was seen and examined on 10/01/2022   Brief hospital course: Mrs. Sandra Schneider was admitted to the hospital with the working diagnosis of heart failure exacerbation.   67 yo female with the past medical history of heart failure, CKD stage 3b, coronary artery disease, breast cancer, T2DM, and hypertension who presented with dyspnea. Patient had several weeks or worsening dyspnea and lower extremity edema. Out patient follow up with cardiology on the day of admission she was found volume overloaded and recommended ER evaluation. On her initial physical examination her blood pressure was 120/80, HR 80, RR 23 and 02 saturation 95%, lungs with no wheezing or rales, heart with S1 and S2 present, irregularly irregular with no gallops, rubs or murmurs, abdomen with no distention, and bilateral lower extremity edema.   Na 137, K 4,5 Cl 98, bicarbonate 26, glucose 153, bun 59, cr 2.2  BNP 2,671  High sensitive troponin 48 and 60  Wbc 7,0 hgb 12,5 plt 250   Chest radiograph with cardiomegaly, bilateral hilar vascular congestion, bilateral interstitial infiltrates. Right upper lobe nodule.   EKG 80 bpm, normal axis, normal intervals, atrial flutter (typical) 3:1 conduction, with no significant ST segment changes, negative T wave lead II, III, AvF, V4 to V6.   05/14 bradycardic episode, 50 bpm, with decreased responsiveness/ syncope. Improved with chest compression. (No cardiac arrest).  05/16 right heart catheterization with severe pulmonary hypertension.  05/17 TEE cardioversion.  05/18 patient back in atrial flutter, she has been placed on milrinone for RV failure with low output.  05/19 continue atrial flutter.   Assessment and Plan: * New onset atrial flutter (HCC) Rate controlled atrial flutter.  Anticoagulation with heparin drip 05/18 TEE, direct current cardioversion.   Patient  is back in atrial flutter with variable block, rate is controlled with IV amiodarone.  Continue anticoagulation with IV heparin.   Acute on chronic diastolic CHF (congestive heart failure) (HCC) Echocardiogram with preserved LV systolic function EF >75%, interventricular septum is flattened in systole and diastole. RV with severe enlargement, RV systolic function with moderate reduction, RVSP 45.9 mmHg. TR mild to moderate.   Acute on chronic core pulmonale.  Pulmonary hypertension (type 4 or 5).  RV failure   V/Q scan with low probability for pulmonary embolism.   05/16 cardiac catheterization  PA 93/44 mean 66 PCWP mean 19 Cardiac output 3,9 and index 1,73 PVR 11,8   Mainly precapillary pulmonary hypertension, with mild left heart failure.   SV02 61.9 Urine output 3,280  ml Systolic blood pressure 147 to 112 mmHg.   Sequential nephron blockade with:  Furosemide drip at 15 ml per hr.  PO Acetazolamide for 2 doses.  05/16, 05/17,05/18  Metolazone 2,5 mg   Inotropic support with milrinone for low output heart failure.   Sildenafil for pulmonary hypertension.   Stage 4 chronic kidney disease (HCC) AKI, hyponatremia.   Improved volume status, renal function with serum cr at 2,16, K is 4,3 and serum bicarbonate at 28. Na 133,   Continue sequential nephron blockade with acetazolamide, furosemide and metolazone.  Follow up renal function and electrolytes in am.   Essential (primary) hypertension Blood pressure has improved, continue milrinone for inotropic support.   Type 2 diabetes mellitus with hyperglycemia (HCC) Uncontrolled T2Dm with hyperglycemia.  Fasting glucose today is 196 mg/dl. Continue glucose cover and monitoring with insulin sliding scale.    Malignant neoplasm  of upper-outer quadrant of right breast in female, estrogen receptor positive (HCC) Follow up as outpatient.   Class 3 obesity (HCC) Calculated BMI is 47,3   Solitary pulmonary nodule on lung  CT Follow up as outpatient.         Subjective: patient is feeling better, last night had cough but not chest pain, she is out of bed to the chair, mild dyspepsia.   Physical Exam: Vitals:   09/30/22 2300 10/01/22 0200 10/01/22 0334 10/01/22 0720  BP:   (!) 112/43 (!) 147/98  Pulse: 88  98 (!) 129  Resp: 17  (!) 22 20  Temp:   97.6 F (36.4 C) 98.2 F (36.8 C)  TempSrc:   Oral Oral  SpO2: 96%  91%   Weight:  125.1 kg    Height:       Neurology awake and alert ENT with no pallor Cardiovascular with S1 and S2 present, irregular with no gallops, rubs or murmurs. Mild JVD Lower extremity edema + pitting Respiratory with mild rales at bases with no wheezing or rhonchi Abdomen with no distention  Data Reviewed:    Family Communication: no family at the bedside   Disposition: Status is: Inpatient Remains inpatient appropriate because: heart failure with low output on inotropic support.   Planned Discharge Destination: Home      Author: Coralie Keens, MD 10/01/2022 10:40 AM  For on call review [email or web address].

## 2022-10-01 NOTE — Progress Notes (Signed)
Patient refused CPAP.

## 2022-10-01 NOTE — Progress Notes (Signed)
[facility] night cross cover note:   I was notified by RN that the patient is complaining of a cough and requesting a prn antitussive.  I subsequently placed order for prn Tessalon Perles.    Newton Pigg, DO Hospitalist

## 2022-10-01 NOTE — Progress Notes (Addendum)
Patient ID: Sandra Schneider, female   DOB: [birth year], 67 y.o.   MRN: [id number]     Advanced Heart Failure Rounding Note  PCP-Cardiologist: Dina Rich, MD   Subjective:   Admitted with marked volume overload and new atrial flutter. 5/15 had syncopal episode. Heart rate 50. Unresponsive. Had brief CPR.  5/16 RHC markedly elevated RH filling pressures w/ low but not markedly low PAPi, mildly elevated PCWP, severe PAH and low CO w/ CI of 1.73 =>started on milrinone and Lasix gtt  5/17 TEE-guided DCCV to NSR but atrial flutter recurred later that day.   On Milrinone 0.25 + lasix gtt at 15 mg/hr. Better UOP yesterday. Weight down 4 lbs.  Co-ox 62%, CVP 20.   Creatinine 2.07>>1.91>>1.63>>1.95>>2.16   She remains in atrial flutter rate 100s-110s today, now on amiodarone gtt + heparin gtt.    RHC 5/16 Hemodynamics (mmHg) RA 32 RV 95/32 PA 93/44, mean 66 PCWP mean 19 Oxygen saturations: PA 49% AO 96% Cardiac Output (Fick) 3.98  Cardiac Index (Fick) 1.73 PVR 11.8 PAPi 1.5   VQ -low probability for PE.   TEE - LV EF 60-65%, D-shaped septum, moderate RV dilation with moderate RV dysfunction, mod-severe TR.    Objective:   Weight Range: 125.1 kg Body mass index is 45.89 kg/m.   Vital Signs:   Temp:  [97.5 F (36.4 C)-98.2 F (36.8 C)] 98.2 F (36.8 C) (05/19 0720) Pulse Rate:  [88-129] 129 (05/19 0720) Resp:  [14-22] 20 (05/19 0720) BP: (92-156)/(43-104) 147/98 (05/19 0720) SpO2:  [91 %-96 %] 91 % (05/19 0334) Weight:  [125.1 kg] 125.1 kg (05/19 0200) Last BM Date :  (09/25/2022)  Weight change: Filed Weights   09/30/22 0501 09/30/22 0655 10/01/22 0200  Weight: 127.3 kg 126.7 kg 125.1 kg    Intake/Output:   Intake/Output Summary (Last 24 hours) at 10/01/2022 0912 Last data filed at 10/01/2022 0800 Gross per 24 hour  Intake 474 ml  Output 3150 ml  Net -2676 ml      Physical Exam   CVP 20 General: NAD Neck: JVP 16+, no thyromegaly or thyroid  nodule.  Lungs: Clear to auscultation bilaterally with normal respiratory effort. CV: Nondisplaced PMI.  Heart tachy, irregular S1/S2, no S3/S4, no murmur.  1+ edema to knees.  Abdomen: Soft, nontender, no hepatosplenomegaly, moderate distention.  Skin: Intact without lesions or rashes.  Neurologic: Alert and oriented x 3.  Psych: Normal affect. Extremities: No clubbing or cyanosis.  HEENT: Normal.   Telemetry   A flutter 100s-110s, personally reviewed   EKG    N./A  Labs    CBC Recent Labs    09/30/22 1004 10/01/22 0454  WBC 5.9 7.3  HGB 11.2* 11.2*  HCT 35.1* 36.1  MCV 98.9 100.6*  PLT 183 177   Basic Metabolic Panel Recent Labs    78/29/56 1600 10/01/22 0454  NA 132* 133*  K 3.6 4.3  CL 91* 92*  CO2 29 28  GLUCOSE 242* 196*  BUN 49* 47*  CREATININE 1.93* 2.16*  CALCIUM 9.4 9.3   Liver Function Tests No results for input(s): "AST", "ALT", "ALKPHOS", "BILITOT", "PROT", "ALBUMIN" in the last 72 hours. No results for input(s): "LIPASE", "AMYLASE" in the last 72 hours. Cardiac Enzymes No results for input(s): "CKTOTAL", "CKMB", "CKMBINDEX", "TROPONINI" in the last 72 hours.   BNP: BNP (last 3 results) Recent Labs    09/06/22 1611 09/20/22 1105 09/26/22 1318  BNP 1,807.9* 2,885.7* 2,617.1*    ProBNP (last 3 results) No  results for input(s): "PROBNP" in the last 8760 hours.   D-Dimer No results for input(s): "DDIMER" in the last 72 hours. Hemoglobin A1C No results for input(s): "HGBA1C" in the last 72 hours. Fasting Lipid Panel No results for input(s): "CHOL", "HDL", "LDLCALC", "TRIG", "CHOLHDL", "LDLDIRECT" in the last 72 hours. Thyroid Function Tests No results for input(s): "TSH", "T4TOTAL", "T3FREE", "THYROIDAB" in the last 72 hours.  Invalid input(s): "FREET3"   Other results:   Imaging    No results found.   Medications:     Scheduled Medications:  acetaZOLAMIDE  250 mg Oral BID   apixaban  5 mg Oral BID   atorvastatin  40  mg Oral Daily   Chlorhexidine Gluconate Cloth  6 each Topical Daily   dorzolamide-timolol  1 drop Both Eyes BID   famotidine  20 mg Oral Daily   insulin aspart  0-15 Units Subcutaneous TID WC   loratadine  10 mg Oral Daily   metolazone  5 mg Oral Once   polyethylene glycol  17 g Oral Daily   sildenafil  20 mg Oral TID   sodium chloride flush  10-40 mL Intracatheter Q12H   sodium chloride flush  3 mL Intravenous Q12H   sodium chloride flush  3 mL Intravenous Q12H    Infusions:  amiodarone 30 mg/hr (10/01/22 0634)   furosemide (LASIX) 200 mg in dextrose 5 % 100 mL (2 mg/mL) infusion 15 mg/hr (10/01/22 0334)   milrinone 0.25 mcg/kg/min (10/01/22 0233)    PRN Medications: acetaminophen **OR** acetaminophen, benzonatate, hydrALAZINE, loperamide, ondansetron **OR** ondansetron (ZOFRAN) IV, mouth rinse, sodium chloride flush    Patient Profile     Sandra Schneider is a 67 year old with a history of CAD (NSTEMI 06/2012 w/ PCI to LAD and Lcx) LVEF 55% at that time, OSA, HTN, hyperlipidemia, hx of breast ca s/p lumpectomy and HFpEF.    Admitting with A/C HFpEF--RV Failure and new onset Atrial Flutter.   Assessment/Plan   1. RV failure/pulmonary hypertension:  RHC (9/23) with RA 30, RV 107/20-30, PA 107/48 (69), unable to wedge; LVEDP 15. AO 140/64. LVEDP ~16. Assuming a PCWP of 20, TPG would be 49 w/ PVR of 7.  Echo 3/24 with RV severely enlarged w/ severely reduced RV function.  Cardiac MRI in 5/24 with EF 75%, moderate RV dilation with RV EF 50%, moderate TR, mid-wall basal septal/basal inferolateral LGE.  Could be seen with cardiac sarcoidosis (vs prior myocarditis).  CT chest showed RUL nodule (negative on PET) and mediastinal LAN.  V/Q scan not suggestive of chronic PEs.  She has been on sildenafil 20 tid as outpatient. RHC done 5/16 showed severe PAH with low CI 1.73, PAPi 1.5, RA pressure 32 (R>>L heart failure). TEE this admission with LV EF 60-65%, D-shaped septum, moderate RV dilation  with moderate RV dysfunction, mod-severe TR.  Possible group 1 PH with OSA contributing. Now on Milrinone 0.25 with co-ox 62% and CVP 20 on Lasix gtt 15 mg/hr, had metolazone and acetazolamide yesterday.  Diuresis picking up, still has a long way to go.  - Continue milrinone 0.25 mcg/kg/min for RV support.  - Continue Lasix gtt 15 mg/hr.  - Metolazone 5 mg x 1 today.  - Acetazolamide 250 mg bid x 2 doses today.  - BMET in pm.   - Can continue current sildenafil.  - Sent autoimmune serologies => RF elevated, ANA/anti-centromere/SCL-70 negative.  - Needs eventual cardiac PET to assess for cardiac sarcoidosis.   - Severe OSA, needs CPAP. Refused  last night.  2. Syncope: Suspect this was due to severe PH/RV failure.  No arrhythmias noted on telemetry.  3. Atrial flutter: New diagnosis.  V-rates mildly elevated low 100s.  TEE-guided DCCV to NSR yesterday but AFL recurred.  Amiodarone gtt started for rate control and to keep her in NSR after repeat DCCV.   - Will need to get her fully diuresed and off milrinone, then repeat DCCV.  - Continue apixaban. - Continue amiodarone gtt  4. CAD: Remote PCI LAD and LCx.  - Continue statin.  - Stop ASA with anticoagulation.  5. CKD stage 3: creatinine 2.07 => 1.91=>1.6 => 1.96 => 2.16.  Watch with diuresis.   Marca Ancona 10/01/2022 9:12 AM

## 2022-10-02 ENCOUNTER — Telehealth (HOSPITAL_COMMUNITY): Payer: Self-pay

## 2022-10-02 ENCOUNTER — Other Ambulatory Visit (HOSPITAL_COMMUNITY): Payer: Self-pay

## 2022-10-02 ENCOUNTER — Encounter (HOSPITAL_COMMUNITY): Payer: Self-pay | Admitting: Cardiology

## 2022-10-02 DIAGNOSIS — I10 Essential (primary) hypertension: Secondary | ICD-10-CM | POA: Diagnosis not present

## 2022-10-02 DIAGNOSIS — N184 Chronic kidney disease, stage 4 (severe): Secondary | ICD-10-CM | POA: Diagnosis not present

## 2022-10-02 DIAGNOSIS — I4892 Unspecified atrial flutter: Secondary | ICD-10-CM | POA: Diagnosis not present

## 2022-10-02 DIAGNOSIS — I5033 Acute on chronic diastolic (congestive) heart failure: Secondary | ICD-10-CM | POA: Diagnosis not present

## 2022-10-02 LAB — BASIC METABOLIC PANEL
Anion gap: 13 (ref 5–15)
Anion gap: 13 (ref 5–15)
BUN: 45 mg/dL — ABNORMAL HIGH (ref 8–23)
BUN: 43 mg/dL — ABNORMAL HIGH (ref 8–23)
CO2: 29 mmol/L (ref 22–32)
CO2: 29 mmol/L (ref 22–32)
Calcium: 9.1 mg/dL (ref 8.9–10.3)
Calcium: 9.1 mg/dL (ref 8.9–10.3)
Chloride: 90 mmol/L — ABNORMAL LOW (ref 98–111)
Chloride: 86 mmol/L — ABNORMAL LOW (ref 98–111)
Creatinine, Ser: 2.18 mg/dL — ABNORMAL HIGH (ref 0.44–1.00)
Creatinine, Ser: 2.1 mg/dL — ABNORMAL HIGH (ref 0.44–1.00)
GFR, Estimated: 24 mL/min — ABNORMAL LOW (ref >60)
GFR, Estimated: 25 mL/min — ABNORMAL LOW (ref >60)
Glucose, Bld: 211 mg/dL — ABNORMAL HIGH (ref 70–99)
Glucose, Bld: 320 mg/dL — ABNORMAL HIGH (ref 70–99)
Potassium: 3.5 mmol/L (ref 3.5–5.1)
Potassium: 3.4 mmol/L — ABNORMAL LOW (ref 3.5–5.1)
Sodium: 132 mmol/L — ABNORMAL LOW (ref 135–145)
Sodium: 128 mmol/L — ABNORMAL LOW (ref 135–145)

## 2022-10-02 LAB — MAGNESIUM: Magnesium: 1.8 mg/dL (ref 1.7–2.4)

## 2022-10-02 LAB — COOXEMETRY PANEL
Carboxyhemoglobin: 2.4 % — ABNORMAL HIGH (ref 0.5–1.5)
Methemoglobin: <0.7 % (ref 0.0–1.5)
O2 Saturation: 71.8 %
Total hemoglobin: 11 g/dL — ABNORMAL LOW (ref 12.0–16.0)

## 2022-10-02 LAB — CBC
HCT: 33.4 % — ABNORMAL LOW (ref 36.0–46.0)
Hemoglobin: 10.5 g/dL — ABNORMAL LOW (ref 12.0–15.0)
MCH: 31.4 pg (ref 26.0–34.0)
MCHC: 31.4 g/dL (ref 30.0–36.0)
MCV: 100 fL (ref 80.0–100.0)
Platelets: 163 10*3/uL (ref 150–400)
RBC: 3.34 MIL/uL — ABNORMAL LOW (ref 3.87–5.11)
RDW: 15.2 % (ref 11.5–15.5)
WBC: 7.5 10*3/uL (ref 4.0–10.5)
nRBC: 0 % (ref 0.0–0.2)

## 2022-10-02 LAB — GLUCOSE, CAPILLARY
Glucose-Capillary: 207 mg/dL — ABNORMAL HIGH (ref 70–99)
Glucose-Capillary: 212 mg/dL — ABNORMAL HIGH (ref 70–99)
Glucose-Capillary: 175 mg/dL — ABNORMAL HIGH (ref 70–99)
Glucose-Capillary: 180 mg/dL — ABNORMAL HIGH (ref 70–99)

## 2022-10-02 MED ORDER — MAGNESIUM SULFATE 2 GM/50ML IV SOLN
2.0000 g | Freq: Once | INTRAVENOUS | Status: AC
  Administered 2022-10-02: 2 g via INTRAVENOUS
  Filled 2022-10-02: qty 50

## 2022-10-02 MED ORDER — METOLAZONE 2.5 MG PO TABS
5.0000 mg | ORAL_TABLET | Freq: Two times a day (BID) | ORAL | Status: AC
  Administered 2022-10-02 (×2): 5 mg via ORAL
  Filled 2022-10-02 (×2): qty 2

## 2022-10-02 MED ORDER — POTASSIUM CHLORIDE CRYS ER 20 MEQ PO TBCR
40.0000 meq | EXTENDED_RELEASE_TABLET | Freq: Three times a day (TID) | ORAL | Status: AC
  Administered 2022-10-02 (×3): 40 meq via ORAL
  Filled 2022-10-02 (×3): qty 2

## 2022-10-02 NOTE — Evaluation (Signed)
Physical Therapy Evaluation Patient Details Name: Sandra Schneider MRN: [id number] DOB: [birth year] Today's Date: 10/02/2022  History of Present Illness  67 y/o female with the past medical history of heart failure, CKD stage 3b, coronary artery disease, breast cancer, T2DM, and hypertension who presented with dyspnea. Patient had several weeks or worsening dyspnea and lower extremity edema. Out patient follow up with cardiology on the day of admission she was found volume overloaded and recommended ER evaluation.  Clinical Impression  Pt presents to PT with deficits in cardiopulmonary function, endurance, strength, power, gait, balance. Pt fatigues quickly when ambulating, requiring UE support after short distance of ambulation along with one seated rest break. Pt will benefit from frequent mobilization in an effort to improve activity tolerance. PT recommends discharge home with HHPT. PT will attempt to trial ambulation with a rollator to see if this improves stability and allows for energy conservation.       Recommendations for follow up therapy are one component of a multi-disciplinary discharge planning process, led by the attending physician.  Recommendations may be updated based on patient status, additional functional criteria and insurance authorization.  Follow Up Recommendations       Assistance Recommended at Discharge PRN  Patient can return home with the following  A little help with walking and/or transfers;A little help with bathing/dressing/bathroom;Assistance with cooking/housework;Help with stairs or ramp for entrance    Equipment Recommendations Other (comment) (bariatric rollator)  Recommendations for Other Services       Functional Status Assessment Patient has had a recent decline in their functional status and demonstrates the ability to make significant improvements in function in a reasonable and predictable amount of time.     Precautions / Restrictions  Precautions Precautions: Fall Precaution Comments: syncopal episode in ED Restrictions Weight Bearing Restrictions: No      Mobility  Bed Mobility               General bed mobility comments: received and left in recliner, not assessed    Transfers Overall transfer level: Needs assistance Equipment used: 1 person hand held assist Transfers: Sit to/from Stand Sit to Stand: Min guard                Ambulation/Gait Ambulation/Gait assistance: Min guard Gait Distance (Feet): 100 Feet Assistive device: 1 person hand held assist, IV Pole, None (no UE support initially, IV pole and later hand hold with fatigue) Gait Pattern/deviations: Step-through pattern, Wide base of support Gait velocity: reduced Gait velocity interpretation: <1.8 ft/sec, indicate of risk for recurrent falls   General Gait Details: slowed step-through gait with widened stance  Stairs            Wheelchair Mobility    Modified Rankin (Stroke Patients Only)       Balance Overall balance assessment: Needs assistance Sitting-balance support: No upper extremity supported, Feet supported Sitting balance-Leahy Scale: Good     Standing balance support: Single extremity supported, Reliant on assistive device for balance Standing balance-Leahy Scale: Poor                               Pertinent Vitals/Pain Pain Assessment Pain Assessment: No/denies pain    Home Living Family/patient expects to be discharged to:: Private residence Living Arrangements: Other relatives (sister, son) Available Help at Discharge: Family;Available PRN/intermittently Type of Home: Mobile home Home Access: Stairs to enter;Ramped entrance Entrance Stairs-Rails: Can reach both;Left;Right Entrance Stairs-Number of Steps:  ramp - front entrance, 5 step -back entrance   Home Layout: One level Home Equipment: None      Prior Function Prior Level of Function : Independent/Modified  Independent;Driving             Mobility Comments: reduced activity tolerance over the last month. Typically can walk around the grocery store but has been utilizing electric cart in last few weeks ADLs Comments: independent. Reports that she has been having difficulty standing up from the toilet recently.     Hand Dominance   Dominant Hand: Right    Extremity/Trunk Assessment   Upper Extremity Assessment Upper Extremity Assessment: Defer to OT evaluation    Lower Extremity Assessment Lower Extremity Assessment: Generalized weakness    Cervical / Trunk Assessment Cervical / Trunk Assessment: Normal  Communication   Communication: No difficulties  Cognition Arousal/Alertness: Awake/alert Behavior During Therapy: WFL for tasks assessed/performed Overall Cognitive Status: Within Functional Limits for tasks assessed                                          General Comments General comments (skin integrity, edema, etc.): pt on 3.5L [place] upon PT arrival, weaned to 2L [place] with mobility, sats ranging from 90-92% with ambulation. Pt returned to 3.5 L [place] at end of session    Exercises     Assessment/Plan    PT Assessment Patient needs continued PT services  PT Problem List Decreased strength;Decreased activity tolerance;Decreased balance;Decreased mobility;Cardiopulmonary status limiting activity       PT Treatment Interventions DME instruction;Gait training;Functional mobility training;Therapeutic activities;Therapeutic exercise;Balance training;Stair training;Patient/family education    PT Goals (Current goals can be found in the Care Plan section)  Acute Rehab PT Goals Patient Stated Goal: to improve activity tolerance PT Goal Formulation: With patient Time For Goal Achievement: 10/16/22 Potential to Achieve Goals: Fair Additional Goals Additional Goal #1: Pt will report 1/4 DOE or less when ambulating fro >250' on room air    Frequency Min 3X/week      Co-evaluation PT/OT/SLP Co-Evaluation/Treatment: Yes Reason for Co-Treatment: Complexity of the patient's impairments (multi-system involvement);To address functional/ADL transfers PT goals addressed during session: Mobility/safety with mobility;Balance OT goals addressed during session: ADL's and self-care;Strengthening/ROM;Proper use of Adaptive equipment and DME       AM-PAC PT "6 Clicks" Mobility  Outcome Measure Help needed turning from your back to your side while in a flat bed without using bedrails?: A Little Help needed moving from lying on your back to sitting on the side of a flat bed without using bedrails?: A Little Help needed moving to and from a bed to a chair (including a wheelchair)?: A Little Help needed standing up from a chair using your arms (e.g., wheelchair or bedside chair)?: A Little Help needed to walk in hospital room?: A Little Help needed climbing 3-5 steps with a railing? : A Little 6 Click Score: 18    End of Session   Activity Tolerance: Patient limited by fatigue Patient left: in chair;with call bell/phone within reach Nurse Communication: Mobility status PT Visit Diagnosis: Other abnormalities of gait and mobility (R26.89);Muscle weakness (generalized) (M62.81)    Time: 0930-1001 PT Time Calculation (min) (ACUTE ONLY): 31 min   Charges:   PT Evaluation $PT Eval Low Complexity: 1 Low          Arlyss Gandy, PT, DPT Acute Rehabilitation Office [phone]  Arlyss Gandy 10/02/2022, 11:47 AM

## 2022-10-02 NOTE — TOC Progression Note (Signed)
Transition of Care [facility]) - Progression Note    Patient Details  Name: Na Gatto MRN: [id number] Date of Birth: 10-Jun-1955  Transition of Care [facility]) CM/SW Contact  Elliot Cousin, RN Phone Number: [phone] 10/02/2022, 1:41 PM  Clinical Narrative:  [employer] [employer] for 3n1 bedside commode for home.      Expected Discharge Plan: Home w Home Health Services Barriers to Discharge: Continued Medical Work up  Expected Discharge Plan and Services In-house Referral: NA Discharge Planning Services: CM Consult   Living arrangements for the past 2 months: Single Family Home                   DME Agency: NA                   Social Determinants of Health (SDOH) Interventions SDOH Screenings   Food Insecurity: No Food Insecurity (09/27/2022)  Housing: Low Risk  (09/27/2022)  Transportation Needs: No Transportation Needs (09/27/2022)  Utilities: Not At Risk (09/27/2022)  Tobacco Use: Low Risk  (10/02/2022)    Readmission Risk Interventions     No data to display

## 2022-10-02 NOTE — TOC Benefit Eligibility Note (Signed)
Patient Product/process development scientist completed.    The patient is currently admitted and upon discharge could be taking Eliquis.  The current 30 day co-pay is $11.20.   The patient is insured through [employer]   This test claim was processed through [facility]- copay amounts may vary at other pharmacies due to pharmacy/plan contracts, or as the patient moves through the different stages of their insurance plan.

## 2022-10-02 NOTE — Inpatient Diabetes Management (Signed)
Inpatient Diabetes Program Recommendations  AACE/ADA: New Consensus Statement on Inpatient Glycemic Control (2015)  Target Ranges:  Prepandial:   less than 140 mg/dL      Peak postprandial:   less than 180 mg/dL (1-2 hours)      Critically ill patients:  140 - 180 mg/dL   Lab Results  Component Value Date   GLUCAP 207 (H) 10/02/2022   HGBA1C 5.4 09/08/2022    Review of Glycemic Control  Latest Reference Range & Units 10/01/22 11:25 10/01/22 16:29 10/01/22 21:44 10/02/22 06:23  Glucose-Capillary 70 - 99 mg/dL 161 (H) 096 (H) 045 (H) 207 (H)  (H): Data is abnormally high Diabetes history: Type 2 DM Outpatient Diabetes medications: Metformin 500 mg BID Current orders for Inpatient glycemic control: Novolog 0-15 units TID  Inpatient Diabetes Program Recommendations:    Consider adding Semglee 10 units QD.   Thanks, Lujean Rave, MSN, RNC-OB Diabetes Coordinator [phone] (8a-5p)

## 2022-10-02 NOTE — Telephone Encounter (Signed)
Pharmacy Patient Advocate Encounter  Insurance verification completed.    The patient is insured through [employer]   The patient is currently admitted and ran test claims for the following: Eliquis.  Copays and coinsurance results were relayed to Inpatient clinical team.   

## 2022-10-02 NOTE — Evaluation (Signed)
Occupational Therapy Evaluation Patient Details Name: Sandra Schneider MRN: [id number] DOB: November 12, 1955 Today's Date: 10/02/2022   History of Present Illness 67 y/o female with the past medical history of heart failure, CKD stage 3b, coronary artery disease, breast cancer, T2DM, and hypertension who presented with dyspnea. Patient had several weeks or worsening dyspnea and lower extremity edema. Out patient follow up with cardiology on the day of admission she was found volume overloaded and recommended ER evaluation.   Clinical Impression   Pt admitted with the above diagnosis. Pt currently with functional limitations due to the deficits listed below (see OT Problem List). Prior to admit, pt reports living at home with her sister and older son and was independent with all ADL tasks and functional mobility. Reports that a month ago began to experience increased fatigue and low activity tolerance. Pt will benefit from acute skilled OT to increase their safety and independence with ADL and functional mobility for ADL to facilitate discharge. Recommend follow up HHOT services to work on mentioned deficits. OT will continue to follow patient acutely.   Patient is confined to a single room and not able to walk the distance required to go the bathroom, or he/she is unable to safely negotiate stairs required to access the bathroom.  A 3in1 BSC will alleviate this problem        Recommendations for follow up therapy are one component of a multi-disciplinary discharge planning process, led by the attending physician.  Recommendations may be updated based on patient status, additional functional criteria and insurance authorization.   Assistance Recommended at Discharge Set up Supervision/Assistance  Patient can return home with the following A little help with walking and/or transfers;A little help with bathing/dressing/bathroom;Help with stairs or ramp for entrance;Assistance with  cooking/housework;Assist for transportation    Functional Status Assessment  Patient has had a recent decline in their functional status and demonstrates the ability to make significant improvements in function in a reasonable and predictable amount of time.  Equipment Recommendations  BSC/3in1       Precautions / Restrictions Precautions Precautions: None Precaution Comments: low endurance Restrictions Weight Bearing Restrictions: No      Mobility Bed Mobility Overal bed mobility:  (up in recliner upon therapy arrival)    Patient Response: Cooperative  Transfers Overall transfer level: Needs assistance Equipment used: 1 person hand held assist Transfers: Sit to/from Stand, Bed to chair/wheelchair/BSC Sit to Stand: Supervision     Step pivot transfers: Min guard     General transfer comment: Slow moving requiring increased time. Walked in hallway initially holding IV pole lightly for support. After turning around to return to room, pt required a seated rest break with reports of lightheadedness. When able to walk back to room, pt was provided 1 person HHA in addition to holding onto IV pole.      Balance Overall balance assessment: Needs assistance Sitting-balance support: Feet supported, No upper extremity supported Sitting balance-Leahy Scale: Good Sitting balance - Comments: sitting in recliner     Standing balance-Leahy Scale: Poor Standing balance comment: preferred to use IV pole initially for support then additional 1 person HHA upon returning to room.        ADL either performed or assessed with clinical judgement   ADL       General ADL Comments: Due to decreased endurance and activity tolerance pt required SBA-Min A for BADL tasks.     Vision Baseline Vision/History: 1 Wears glasses (reading) Ability to See in Adequate Light:  0 Adequate Patient Visual Report: No change from baseline Vision Assessment?: No apparent visual deficits             Pertinent Vitals/Pain Pain Assessment Pain Assessment: Faces Faces Pain Scale: No hurt     Hand Dominance Right   Extremity/Trunk Assessment Upper Extremity Assessment Upper Extremity Assessment: Generalized weakness   Lower Extremity Assessment Lower Extremity Assessment: Defer to PT evaluation   Cervical / Trunk Assessment Cervical / Trunk Assessment: Normal   Communication Communication Communication: No difficulties   Cognition Arousal/Alertness: Awake/alert Behavior During Therapy: WFL for tasks assessed/performed Overall Cognitive Status: Within Functional Limits for tasks assessed              General Comments  BP at end of session: 141/88            Home Living Family/patient expects to be discharged to:: Private residence Living Arrangements: Other relatives (sister and older son) Available Help at Discharge: Family;Available PRN/intermittently Type of Home: Mobile home (double wide) Home Access: Stairs to enter;Ramped entrance Entrance Stairs-Number of Steps: ramp - front entrance, 5 step -back entrance Entrance Stairs-Rails: Can reach both;Left;Right Home Layout: One level     Bathroom Shower/Tub: Chief Strategy Officer: Standard     Home Equipment: None          Prior Functioning/Environment Prior Level of Function : Independent/Modified Independent;Driving             Mobility Comments: Reports that over the past month, she has been experiencing decrease endurance and has been limited with her walking distance. Uses electric cart at grocery store. ADLs Comments: independent. Reports that she has been having difficulty standing up from the toilet recently.        OT Problem List: Cardiopulmonary status limiting activity;Decreased strength;Decreased activity tolerance;Impaired balance (sitting and/or standing)      OT Treatment/Interventions: Self-care/ADL training;Therapeutic exercise;Therapeutic  activities;Neuromuscular education;Energy conservation;Patient/family education;DME and/or AE instruction;Balance training    OT Goals(Current goals can be found in the care plan section) Acute Rehab OT Goals Patient Stated Goal: to stand up OT Goal Formulation: Patient unable to participate in goal setting Time For Goal Achievement: 10/16/22 Potential to Achieve Goals: Good  OT Frequency: Min 2X/week    Co-evaluation PT/OT/SLP Co-Evaluation/Treatment: Yes Reason for Co-Treatment: To address functional/ADL transfers   OT goals addressed during session: ADL's and self-care;Strengthening/ROM;Proper use of Adaptive equipment and DME      AM-PAC OT "6 Clicks" Daily Activity     Outcome Measure Help from another person eating meals?: None Help from another person taking care of personal grooming?: A Little Help from another person toileting, which includes using toliet, bedpan, or urinal?: Total (using purewick) Help from another person bathing (including washing, rinsing, drying)?: A Little Help from another person to put on and taking off regular upper body clothing?: A Little Help from another person to put on and taking off regular lower body clothing?: A Little 6 Click Score: 17   End of Session Equipment Utilized During Treatment: Oxygen  Activity Tolerance: Patient tolerated treatment well;Patient limited by fatigue Patient left: in chair;with call bell/phone within reach  OT Visit Diagnosis: Unsteadiness on feet (R26.81);Muscle weakness (generalized) (M62.81)                Time: 0930-1000 OT Time Calculation (min): 30 min Charges:  OT General Charges $OT Visit: 1 Visit OT Evaluation $OT Eval Moderate Complexity: 1 Mod OT Treatments $Therapeutic Activity: 8-22 mins  [employer], OTR/L,CBIS  Supplemental OT - [facility] and [facility] Secure Chat Preferred    Beretta Ginsberg, Charisse March 10/02/2022, 10:12 AM

## 2022-10-02 NOTE — Progress Notes (Addendum)
Patient ID: Sandra Schneider, female   DOB: [birth year], 67 y.o.   MRN: [id number]     Advanced Heart Failure Rounding Note  PCP-Cardiologist: Dina Rich, MD   Subjective:   Admitted with marked volume overload and new atrial flutter. 5/15 had syncopal episode. Heart rate 50. Unresponsive. Had brief CPR.  5/16 RHC markedly elevated RH filling pressures w/ low but not markedly low PAPi, mildly elevated PCWP, severe PAH and low CO w/ CI of 1.73 =>started on milrinone and Lasix gtt  5/17 TEE-guided DCCV to NSR but atrial flutter recurred later that day.   CO-OX 72% on milrinone 0.25.   CVP 17-18. Continues on lasix gtt at 15/hr + 250 mg diamox BID + 5 mg metolazone. Is/Os not accurate d/t unmeasured voids but weight down another 5 lb.   Refusing CPAP at night.   Has a hard time getting out of bed.   RHC 5/16 Hemodynamics (mmHg) RA 32 RV 95/32 PA 93/44, mean 66 PCWP mean 19 Oxygen saturations: PA 49% AO 96% Cardiac Output (Fick) 3.98  Cardiac Index (Fick) 1.73 PVR 11.8 PAPi 1.5   VQ -low probability for PE.   TEE - LV EF 60-65%, D-shaped septum, moderate RV dilation with moderate RV dysfunction, mod-severe TR.    Objective:   Weight Range: 122.8 kg Body mass index is 45.05 kg/m.   Vital Signs:   Temp:  [98 F (36.7 C)-98.3 F (36.8 C)] 98.1 F (36.7 C) (05/20 0342) Pulse Rate:  [104-129] 104 (05/20 0342) Resp:  [14-20] 16 (05/20 0342) BP: (111-147)/(52-98) 111/52 (05/20 0342) SpO2:  [93 %-95 %] 93 % (05/20 0342) Weight:  [122.8 kg] 122.8 kg (05/20 0520) Last BM Date :  (09/25/2022)  Weight change: Filed Weights   09/30/22 0655 10/01/22 0200 10/02/22 0520  Weight: 126.7 kg 125.1 kg 122.8 kg    Intake/Output:   Intake/Output Summary (Last 24 hours) at 10/02/2022 0702 Last data filed at 10/01/2022 2230 Gross per 24 hour  Intake 1221 ml  Output 1950 ml  Net -729 ml      Physical Exam   General:  Lying comfortably in bed HEENT: normal Neck:  Thick neck.  JVP to jaw.  Cor: PMI nondisplaced. Irregular rhythm, tachy. No rubs, gallops or murmurs. Lungs: diminished Abdomen: obese, soft, nontender, nondistended.  Extremities: no cyanosis, clubbing, rash, 1+ edema Neuro: alert & orientedx3, cranial nerves grossly intact. moves all 4 extremities w/o difficulty. Affect pleasant   Telemetry   AFL 100s-110s  EKG    N./A  Labs    CBC Recent Labs    10/01/22 0454 10/02/22 0532  WBC 7.3 7.5  HGB 11.2* 10.5*  HCT 36.1 33.4*  MCV 100.6* 100.0  PLT 177 163   Basic Metabolic Panel Recent Labs    40/10/27 1600 10/02/22 0532  NA 129* 132*  K 3.5 3.5  CL 87* 90*  CO2 28 29  GLUCOSE 322* 211*  BUN 46* 45*  CREATININE 2.05* 2.18*  CALCIUM 9.1 9.1  MG  --  1.8   Liver Function Tests No results for input(s): "AST", "ALT", "ALKPHOS", "BILITOT", "PROT", "ALBUMIN" in the last 72 hours. No results for input(s): "LIPASE", "AMYLASE" in the last 72 hours. Cardiac Enzymes No results for input(s): "CKTOTAL", "CKMB", "CKMBINDEX", "TROPONINI" in the last 72 hours.   BNP: BNP (last 3 results) Recent Labs    09/06/22 1611 09/20/22 1105 09/26/22 1318  BNP 1,807.9* 2,885.7* 2,617.1*    ProBNP (last 3 results) No results for input(s): "PROBNP"  in the last 8760 hours.   D-Dimer No results for input(s): "DDIMER" in the last 72 hours. Hemoglobin A1C No results for input(s): "HGBA1C" in the last 72 hours. Fasting Lipid Panel No results for input(s): "CHOL", "HDL", "LDLCALC", "TRIG", "CHOLHDL", "LDLDIRECT" in the last 72 hours. Thyroid Function Tests No results for input(s): "TSH", "T4TOTAL", "T3FREE", "THYROIDAB" in the last 72 hours.  Invalid input(s): "FREET3"   Other results:   Imaging    No results found.   Medications:     Scheduled Medications:  apixaban  5 mg Oral BID   atorvastatin  40 mg Oral Daily   Chlorhexidine Gluconate Cloth  6 each Topical Daily   dorzolamide-timolol  1 drop Both Eyes BID    famotidine  20 mg Oral Daily   insulin aspart  0-15 Units Subcutaneous TID WC   loratadine  10 mg Oral Daily   polyethylene glycol  17 g Oral Daily   sildenafil  20 mg Oral TID   sodium chloride flush  10-40 mL Intracatheter Q12H   sodium chloride flush  3 mL Intravenous Q12H   sodium chloride flush  3 mL Intravenous Q12H    Infusions:  amiodarone 30 mg/hr (10/02/22 0519)   furosemide (LASIX) 200 mg in dextrose 5 % 100 mL (2 mg/mL) infusion 15 mg/hr (10/02/22 0516)   milrinone 0.25 mcg/kg/min (10/02/22 0515)    PRN Medications: acetaminophen **OR** acetaminophen, benzonatate, hydrALAZINE, loperamide, ondansetron **OR** ondansetron (ZOFRAN) IV, mouth rinse, sodium chloride flush    Patient Profile     [name] is a 67 year old with a history of CAD (NSTEMI 06/2012 w/ PCI to LAD and Lcx) LVEF 55% at that time, OSA, HTN, hyperlipidemia, hx of breast ca s/p lumpectomy and HFpEF.    Admitted with A/C HFpEF--RV Failure and new onset Atrial Flutter.   Assessment/Plan   1. RV failure/pulmonary hypertension:   - RHC (9/23) with RA 30, RV 107/20-30, PA 107/48 (69), unable to wedge; LVEDP 15. AO 140/64. LVEDP ~16. Assuming a PCWP of 20, TPG would be 49 w/ PVR of 7.  Echo 3/24 with RV severely enlarged w/ severely reduced RV function.  Cardiac MRI in 5/24 with EF 75%, moderate RV dilation with RV EF 50%, moderate TR, mid-wall basal septal/basal inferolateral LGE.  Could be seen with cardiac sarcoidosis (vs prior myocarditis).  CT chest showed RUL nodule (negative on PET) and mediastinal LAN.  V/Q scan not suggestive of chronic PEs.  She has been on sildenafil 20 tid as outpatient. RHC done 5/16 showed severe PAH with low CI 1.73, PAPi 1.5, RA pressure 32 (R>>L heart failure). TEE this admission with LV EF 60-65%, D-shaped septum, moderate RV dilation with moderate RV dysfunction, mod-severe TR.  Possible group 1 PH with OSA contributing.  - Now on Milrinone 0.25 with co-ox 72%.  CVP 17-18.  Continue lasix gtt at 15/hr. Give 5 mg metolazone BID today. -Continue milrinone 0.25 mcg/kg/min for RV support until fully diuresed - BMET in pm.   - Can continue current sildenafil.  - Sent autoimmune serologies => RF elevated, ANA/anti-centromere/SCL-70 negative.  - Needs eventual cardiac PET to assess for cardiac sarcoidosis.   - Severe OSA, needs CPAP. Continues to refuse.  2. Syncope:  - Suspect this was due to severe PH/RV failure.  No arrhythmias noted on telemetry.   3. Atrial flutter:  - New diagnosis.  V-rates mildly elevated low 100s.  TEE-guided DCCV to NSR 05/17 but AFL recurred.  Amiodarone gtt started for rate  control and to keep her in NSR after repeat DCCV.   - Will need to get her fully diuresed and off milrinone, then repeat DCCV.  - Continue apixaban. - Continue amiodarone gtt   4. CAD:  - Remote PCI LAD and LCx.  - Continue statin.  - Stopped ASA with anticoagulation.   5. CKD stage 3:  - creatinine stable 2.18.  Watch with diuresis.   6. Hypokalemia/hypomagnesemia: - K 3.5, mag 1.8 - Supp aggressively with diuresis - labs this afternoon  Needs to mobilize. Has a hard time getting out of bed. PT/OT eval.  FINCH, LINDSAY N 10/02/2022 7:02 AM  Patient seen and examined with the above-signed Advanced Practice Provider and/or Housestaff. I personally reviewed laboratory data, imaging studies and relevant notes. I independently examined the patient and formulated the important aspects of the plan. I have edited the note to reflect any of my changes or salient points. I have personally discussed the plan with the patient and/or family.  Remains on milrinone and IV lasix. Co-ox and CVP remain high. Remains in AFL on amio and Eliquis.   + weak and SOB  General:  Obese woman lying in bed No resp difficulty HEENT: normal Neck: supple. Unable to see jvp  Carotids 2+ bilat; no bruits. No lymphadenopathy or thryomegaly appreciated. Cor: PMI nondisplaced. Regular  tachy Lungs: clear Abdomen: obese soft, nontender, nondistended. Good bowel sounds. Extremities: no cyanosis, clubbing, rash, 1-2+ edema Neuro: alert & orientedx3, cranial nerves grossly intact. moves all 4 extremities w/o difficulty. Affect pleasant  Suspect PAH due to OHS/OSA. Continue milrinone and IV diuresis. Increase lasix gtt to 20.   Eventual repeat DC-CV once better diuresed.   Arvilla Meres, MD  7:36 PM

## 2022-10-02 NOTE — Progress Notes (Signed)
Progress Note   Patient: Sandra Schneider AVW:[id number] DOB: 23-Jun-1955 DOA: 09/26/2022     6 DOS: the patient was seen and examined on 10/02/2022   Brief hospital course: Sandra Schneider was admitted to the hospital with the working diagnosis of heart failure exacerbation.   67 yo female with the past medical history of heart failure, CKD stage 3b, coronary artery disease, breast cancer, T2DM, and hypertension who presented with dyspnea. Patient had several weeks or worsening dyspnea and lower extremity edema. Out patient follow up with cardiology on the day of admission she was found volume overloaded and recommended ER evaluation. On her initial physical examination her blood pressure was 120/80, HR 80, RR 23 and 02 saturation 95%, lungs with no wheezing or rales, heart with S1 and S2 present, irregularly irregular with no gallops, rubs or murmurs, abdomen with no distention, and bilateral lower extremity edema.   Na 137, K 4,5 Cl 98, bicarbonate 26, glucose 153, bun 59, cr 2.2  BNP 2,671  High sensitive troponin 48 and 60  Wbc 7,0 hgb 12,5 plt 250   Chest radiograph with cardiomegaly, bilateral hilar vascular congestion, bilateral interstitial infiltrates. Right upper lobe nodule.   EKG 80 bpm, normal axis, normal intervals, atrial flutter (typical) 3:1 conduction, with no significant ST segment changes, negative T wave lead II, III, AvF, V4 to V6.   05/14 bradycardic episode, 50 bpm, with decreased responsiveness/ syncope. Improved with chest compression. (No cardiac arrest).  05/16 right heart catheterization with severe pulmonary hypertension.  05/17 TEE cardioversion.  05/18 patient back in atrial flutter, she has been placed on milrinone for RV failure with low output.  05/19 continue atrial flutter.  05/20 atrial fibrillation on telemetry monitor.   Assessment and Plan: * New onset atrial flutter (HCC) Rate controlled atrial flutter.  Anticoagulation with heparin  drip 05/18 failed direct current cardioversion. (TEE).   Patient is back in atrial fibrillation on telemetry. Continue with IV amiodarone.  Continue anticoagulation with IV heparin.  May need second cardioversion when more euvolemic and off milrinone drip.  Acute on chronic diastolic CHF (congestive heart failure) (HCC) Echocardiogram with preserved LV systolic function EF >75%, interventricular septum is flattened in systole and diastole. RV with severe enlargement, RV systolic function with moderate reduction, RVSP 45.9 mmHg. TR mild to moderate.   Acute on chronic core pulmonale.  Pulmonary hypertension (probably type 5).  RV failure   V/Q scan with low probability for pulmonary embolism.   05/16 cardiac catheterization  PA 93/44 mean 66 PCWP mean 19 Cardiac output 3,9 and index 1,73 PVR 11,8   Mainly precapillary pulmonary hypertension, with mild left heart failure.   SV02 71,8  Urine output 1,950  ml Systolic blood pressure 97 to 829 mmHg.   Sequential nephron blockade with:   Sp Acetazolamide.  Furosemide drip 15 mg per hr.   Metolazone on 05/16, 05/17,05/18, 05/20   Inotropic support with milrinone for low output heart failure.   Sildenafil for pulmonary hypertension.   Stage 4 chronic kidney disease (HCC) AKI, hyponatremia.   Renal function today with serum cr at 2,18 with K at 3,5 and serum bicarbonate at 29. Na 132 and Mg 1,8   Add kcl and  mag today.  Follow up renal function in am.   Essential (primary) hypertension Blood pressure has improved, continue milrinone for inotropic support.   Type 2 diabetes mellitus with hyperglycemia (HCC) Uncontrolled T2Dm with hyperglycemia.  Fasting glucose today is 211 mg/dl. Continue glucose cover and  monitoring with insulin sliding scale.    Malignant neoplasm of upper-outer quadrant of right breast in female, estrogen receptor positive (HCC) Follow up as outpatient.   Class 3 obesity (HCC) Calculated BMI is  47,3   Solitary pulmonary nodule on lung CT Follow up as outpatient.         Subjective: Patient with no chest pain or dyspnea, continue weak and deconditioned.   Physical Exam: Vitals:   10/02/22 0520 10/02/22 0741 10/02/22 1053 10/02/22 1546  BP:  (!) 153/96 97/76 123/68  Pulse:  (!) 114 96 88  Resp:  17 18 16   Temp:  98.7 F (37.1 C) 98 F (36.7 C) 97.7 F (36.5 C)  TempSrc:  Oral Oral Oral  SpO2:      Weight: 122.8 kg     Height:       Neurology awake and alert ENT with mild pallor Cardiovascular with mild JVD, heart with S1 and S2 present, irregularly irregular with no gallops, Respiratory with no rales or wheezing, no rhonchi Abdomen with no distention  Data Reviewed:    Family Communication: no family at the bedside   Disposition: Status is: Inpatient Remains inpatient appropriate because: right heart failure on IV inotropic support   Planned Discharge Destination: Home     Author: Coralie Keens, MD 10/02/2022 4:13 PM  For on call review [email or web address].

## 2022-10-03 DIAGNOSIS — I10 Essential (primary) hypertension: Secondary | ICD-10-CM | POA: Diagnosis not present

## 2022-10-03 DIAGNOSIS — I4819 Other persistent atrial fibrillation: Secondary | ICD-10-CM

## 2022-10-03 DIAGNOSIS — I4892 Unspecified atrial flutter: Secondary | ICD-10-CM | POA: Diagnosis not present

## 2022-10-03 DIAGNOSIS — I5033 Acute on chronic diastolic (congestive) heart failure: Secondary | ICD-10-CM | POA: Diagnosis not present

## 2022-10-03 DIAGNOSIS — N184 Chronic kidney disease, stage 4 (severe): Secondary | ICD-10-CM | POA: Diagnosis not present

## 2022-10-03 LAB — COOXEMETRY PANEL
Carboxyhemoglobin: 1.9 % — ABNORMAL HIGH (ref 0.5–1.5)
Methemoglobin: 0.8 % (ref 0.0–1.5)
O2 Saturation: 66.3 %
Total hemoglobin: 10.8 g/dL — ABNORMAL LOW (ref 12.0–16.0)

## 2022-10-03 LAB — CBC
HCT: 33.2 % — ABNORMAL LOW (ref 36.0–46.0)
Hemoglobin: 10.2 g/dL — ABNORMAL LOW (ref 12.0–15.0)
MCH: 31 pg (ref 26.0–34.0)
MCHC: 30.7 g/dL (ref 30.0–36.0)
MCV: 100.9 fL — ABNORMAL HIGH (ref 80.0–100.0)
Platelets: 152 10*3/uL (ref 150–400)
RBC: 3.29 MIL/uL — ABNORMAL LOW (ref 3.87–5.11)
RDW: 15.2 % (ref 11.5–15.5)
WBC: 6.3 10*3/uL (ref 4.0–10.5)
nRBC: 0 % (ref 0.0–0.2)

## 2022-10-03 LAB — GLUCOSE, CAPILLARY
Glucose-Capillary: 186 mg/dL — ABNORMAL HIGH (ref 70–99)
Glucose-Capillary: 234 mg/dL — ABNORMAL HIGH (ref 70–99)
Glucose-Capillary: 202 mg/dL — ABNORMAL HIGH (ref 70–99)
Glucose-Capillary: 230 mg/dL — ABNORMAL HIGH (ref 70–99)

## 2022-10-03 LAB — BASIC METABOLIC PANEL
Anion gap: 12 (ref 5–15)
BUN: 46 mg/dL — ABNORMAL HIGH (ref 8–23)
CO2: 30 mmol/L (ref 22–32)
Calcium: 9.1 mg/dL (ref 8.9–10.3)
Chloride: 87 mmol/L — ABNORMAL LOW (ref 98–111)
Creatinine, Ser: 2.22 mg/dL — ABNORMAL HIGH (ref 0.44–1.00)
GFR, Estimated: 24 mL/min — ABNORMAL LOW (ref >60)
Glucose, Bld: 218 mg/dL — ABNORMAL HIGH (ref 70–99)
Potassium: 3.8 mmol/L (ref 3.5–5.1)
Sodium: 129 mmol/L — ABNORMAL LOW (ref 135–145)

## 2022-10-03 LAB — MAGNESIUM: Magnesium: 2.2 mg/dL (ref 1.7–2.4)

## 2022-10-03 MED ORDER — MILRINONE LACTATE IN DEXTROSE 20-5 MG/100ML-% IV SOLN
0.1250 ug/kg/min | INTRAVENOUS | Status: DC
  Administered 2022-10-04 – 2022-10-05 (×2): 0.125 ug/kg/min via INTRAVENOUS
  Filled 2022-10-03 (×2): qty 100

## 2022-10-03 MED ORDER — POTASSIUM CHLORIDE CRYS ER 20 MEQ PO TBCR
40.0000 meq | EXTENDED_RELEASE_TABLET | ORAL | Status: AC
  Administered 2022-10-03 (×3): 40 meq via ORAL
  Filled 2022-10-03 (×3): qty 2

## 2022-10-03 MED ORDER — SILDENAFIL CITRATE 20 MG PO TABS
40.0000 mg | ORAL_TABLET | Freq: Three times a day (TID) | ORAL | Status: DC
  Administered 2022-10-03 – 2022-10-07 (×11): 40 mg via ORAL
  Filled 2022-10-03 (×11): qty 2

## 2022-10-03 MED ORDER — METOLAZONE 2.5 MG PO TABS
5.0000 mg | ORAL_TABLET | Freq: Two times a day (BID) | ORAL | Status: AC
  Administered 2022-10-03 (×2): 5 mg via ORAL
  Filled 2022-10-03 (×2): qty 2

## 2022-10-03 MED ORDER — INSULIN GLARGINE-YFGN 100 UNIT/ML ~~LOC~~ SOLN
5.0000 [IU] | Freq: Every day | SUBCUTANEOUS | Status: DC
  Administered 2022-10-03: 5 [IU] via SUBCUTANEOUS
  Filled 2022-10-03 (×2): qty 0.05

## 2022-10-03 MED ORDER — INSULIN ASPART 100 UNIT/ML IJ SOLN
0.0000 [IU] | Freq: Every day | INTRAMUSCULAR | Status: DC
  Administered 2022-10-03 – 2022-10-04 (×2): 2 [IU] via SUBCUTANEOUS

## 2022-10-03 NOTE — Progress Notes (Signed)
Physical Therapy Treatment Patient Details Name: Sandra Schneider MRN: [id number] DOB: [birth year] Today's Date: 10/03/2022   History of Present Illness 67 y/o female adm 09/26/22 with dyspnea, volume overload and new Aflutter. 5/15 syncope in ED with brief CPR. 5/17 TEE with DCCV with return of Aflutter same day. PMhx: CHF, CKD, CAD, breast CA, T2DM, HTN    PT Comments    Pt pleasant and eager to walk, able to maintain SPO2 on 2L throughout with attempt at RA briefly at rest and pt with desaturation to 88%. HR 99-110. Pt educated for rollator use, weighing daily, low sodium diet and sticking to heart healthy diet to prevent readmission. Pt reports not understanding full gravity of CHF and needing to follow diet and weigh appropriately. Pt also educated for walking program and activity progression.     Recommendations for follow up therapy are one component of a multi-disciplinary discharge planning process, led by the attending physician.  Recommendations may be updated based on patient status, additional functional criteria and insurance authorization.  Follow Up Recommendations       Assistance Recommended at Discharge PRN  Patient can return home with the following A little help with walking and/or transfers;A little help with bathing/dressing/bathroom;Assistance with cooking/housework;Help with stairs or ramp for entrance   Equipment Recommendations  Rollator (4 wheels)    Recommendations for Other Services       Precautions / Restrictions Precautions Precautions: Fall;Other (comment) Precaution Comments: watch SPO2 Restrictions Weight Bearing Restrictions: No     Mobility  Bed Mobility               General bed mobility comments: in recliner on arrival and end of session    Transfers Overall transfer level: Modified independent                 General transfer comment: pt able to stand from chair without assist    Ambulation/Gait Ambulation/Gait  assistance: Min guard Gait Distance (Feet): 400 Feet Assistive device: Rollator (4 wheels) Gait Pattern/deviations: Step-through pattern, Decreased stride length   Gait velocity interpretation: 1.31 - 2.62 ft/sec, indicative of limited community ambulator   General Gait Details: cues for proximity to RW, pt with increased gait tolerance with use of rollator with 4 brief standing rest breaks during gait with SPO2 90-93% on 2L   Stairs             Wheelchair Mobility    Modified Rankin (Stroke Patients Only)       Balance Overall balance assessment: Mild deficits observed, not formally tested Sitting-balance support: No upper extremity supported, Feet supported Sitting balance-Leahy Scale: Good     Standing balance support: Bilateral upper extremity supported Standing balance-Leahy Scale: Poor Standing balance comment: rollator use for gait                            Cognition Arousal/Alertness: Awake/alert Behavior During Therapy: WFL for tasks assessed/performed Overall Cognitive Status: Within Functional Limits for tasks assessed                                          Exercises      General Comments        Pertinent Vitals/Pain Pain Assessment Pain Assessment: No/denies pain    Home Living  Prior Function            PT Goals (current goals can now be found in the care plan section) Progress towards PT goals: Progressing toward goals    Frequency    Min 1X/week      PT Plan Current plan remains appropriate    Co-evaluation              AM-PAC PT "6 Clicks" Mobility   Outcome Measure  Help needed turning from your back to your side while in a flat bed without using bedrails?: None Help needed moving from lying on your back to sitting on the side of a flat bed without using bedrails?: None Help needed moving to and from a bed to a chair (including a wheelchair)?:  None Help needed standing up from a chair using your arms (e.g., wheelchair or bedside chair)?: A Little Help needed to walk in hospital room?: A Little Help needed climbing 3-5 steps with a railing? : A Little 6 Click Score: 21    End of Session Equipment Utilized During Treatment: Oxygen Activity Tolerance: Patient tolerated treatment well Patient left: in chair;with call bell/phone within reach Nurse Communication: Mobility status PT Visit Diagnosis: Other abnormalities of gait and mobility (R26.89);Muscle weakness (generalized) (M62.81)     Time: 8469-6295 PT Time Calculation (min) (ACUTE ONLY): 25 min  Charges:  $Gait Training: 8-22 mins $Therapeutic Activity: 8-22 mins                     Merryl Hacker, PT Acute Rehabilitation Services Office: [phone]    Sandra Schneider 10/03/2022, 11:33 AM

## 2022-10-03 NOTE — Progress Notes (Addendum)
Patient ID: Sandra Schneider, female   DOB: 05-15-1956, 67 y.o.   MRN: [id number]     Advanced Heart Failure Rounding Note  PCP-Cardiologist: Dina Rich, MD   Subjective:   Admitted with marked volume overload and new atrial flutter. 5/15 had syncopal episode. Heart rate 50. Unresponsive. Had brief CPR.  5/16 RHC markedly elevated RH filling pressures w/ low but not markedly low PAPi, mildly elevated PCWP, severe PAH and low CO w/ CI of 1.73 =>started on milrinone and Lasix gtt  5/17 TEE-guided DCCV to NSR but atrial flutter recurred later that day.  5/20 Diuresed with Lasix drip 15 mg per hour and given 5 mg metolazonex2.   Remains on milrinone 0.25 mcg. CO-OX 66%.  Diuresing with lasix drip @ 15 mg per hour + metolazone. I/O not accurate. Weight down a couple pounds.   Got light headed when she was walking. Walked 100 feet. Denies SOB.   RHC 5/16 Hemodynamics (mmHg) RA 32 RV 95/32 PA 93/44, mean 66 PCWP mean 19 Oxygen saturations: PA 49% AO 96% Cardiac Output (Fick) 3.98  Cardiac Index (Fick) 1.73 PVR 11.8 PAPi 1.5   VQ -low probability for PE.   TEE - LV EF 60-65%, D-shaped septum, moderate RV dilation with moderate RV dysfunction, mod-severe TR.    Objective:   Weight Range: 121.6 kg Body mass index is 44.61 kg/m.   Vital Signs:   Temp:  [97.7 F (36.5 C)-98.7 F (37.1 C)] 97.9 F (36.6 C) (05/21 0425) Pulse Rate:  [88-114] 91 (05/21 0425) Resp:  [16-24] 24 (05/21 0425) BP: (97-153)/(68-96) 128/69 (05/21 0425) SpO2:  [87 %-92 %] 87 % (05/21 0425) Weight:  [121.6 kg] 121.6 kg (05/21 0425) Last BM Date : 10/01/22  Weight change: Filed Weights   10/01/22 0200 10/02/22 0520 10/03/22 0425  Weight: 125.1 kg 122.8 kg 121.6 kg    Intake/Output:   Intake/Output Summary (Last 24 hours) at 10/03/2022 0708 Last data filed at 10/03/2022 0300 Gross per 24 hour  Intake 867.62 ml  Output 1325 ml  Net -457.38 ml     CVP 13-14  Physical Exam   General: Sitting in the chair.  No resp difficulty HEENT: normal Neck: supple. JVP difficult to assess. . Carotids 2+ bilat; no bruits. No lymphadenopathy or thryomegaly appreciated. Cor: PMI nondisplaced. Tachy Regular rate & rhythm. No rubs, gallops or murmurs. Lungs: clear Abdomen: soft, nontender, nondistended. No hepatosplenomegaly. No bruits or masses. Good bowel sounds. Extremities: no cyanosis, clubbing, rash, edema. LUE PICC ecchymotic above dressing. Neuro: alert & orientedx3, cranial nerves grossly intact. moves all 4 extremities w/o difficulty. Affect pleasant  Telemetry    AFL 80-110s   EKG    N./A  Labs    CBC Recent Labs    10/02/22 0532 10/03/22 0415  WBC 7.5 6.3  HGB 10.5* 10.2*  HCT 33.4* 33.2*  MCV 100.0 100.9*  PLT 163 152   Basic Metabolic Panel Recent Labs    78/29/56 0532 10/02/22 1550 10/03/22 0415  NA 132* 128* 129*  K 3.5 3.4* 3.8  CL 90* 86* 87*  CO2 29 29 30   GLUCOSE 211* 320* 218*  BUN 45* 43* 46*  CREATININE 2.18* 2.10* 2.22*  CALCIUM 9.1 9.1 9.1  MG 1.8  --   --    Liver Function Tests No results for input(s): "AST", "ALT", "ALKPHOS", "BILITOT", "PROT", "ALBUMIN" in the last 72 hours. No results for input(s): "LIPASE", "AMYLASE" in the last 72 hours. Cardiac Enzymes No results for input(s): "CKTOTAL", "  CKMB", "CKMBINDEX", "TROPONINI" in the last 72 hours.   BNP: BNP (last 3 results) Recent Labs    09/06/22 1611 09/20/22 1105 09/26/22 1318  BNP 1,807.9* 2,885.7* 2,617.1*    ProBNP (last 3 results) No results for input(s): "PROBNP" in the last 8760 hours.   D-Dimer No results for input(s): "DDIMER" in the last 72 hours. Hemoglobin A1C No results for input(s): "HGBA1C" in the last 72 hours. Fasting Lipid Panel No results for input(s): "CHOL", "HDL", "LDLCALC", "TRIG", "CHOLHDL", "LDLDIRECT" in the last 72 hours. Thyroid Function Tests No results for input(s): "TSH", "T4TOTAL", "T3FREE", "THYROIDAB" in the last 72  hours.  Invalid input(s): "FREET3"   Other results:   Imaging    No results found.   Medications:     Scheduled Medications:  apixaban  5 mg Oral BID   atorvastatin  40 mg Oral Daily   Chlorhexidine Gluconate Cloth  6 each Topical Daily   dorzolamide-timolol  1 drop Both Eyes BID   famotidine  20 mg Oral Daily   insulin aspart  0-15 Units Subcutaneous TID WC   loratadine  10 mg Oral Daily   polyethylene glycol  17 g Oral Daily   sildenafil  20 mg Oral TID   sodium chloride flush  10-40 mL Intracatheter Q12H   sodium chloride flush  3 mL Intravenous Q12H   sodium chloride flush  3 mL Intravenous Q12H    Infusions:  amiodarone 30 mg/hr (10/03/22 0405)   furosemide (LASIX) 200 mg in dextrose 5 % 100 mL (2 mg/mL) infusion 15 mg/hr (10/03/22 0643)   milrinone 0.25 mcg/kg/min (10/03/22 0300)    PRN Medications: acetaminophen **OR** acetaminophen, benzonatate, hydrALAZINE, loperamide, ondansetron **OR** ondansetron (ZOFRAN) IV, mouth rinse, sodium chloride flush    Patient Profile     Sandra Schneider is a 67 year old with a history of CAD (NSTEMI 06/2012 w/ PCI to LAD and Lcx) LVEF 55% at that time, OSA, HTN, hyperlipidemia, hx of breast ca s/p lumpectomy and HFpEF.    Admitted with A/C HFpEF--RV Failure and new onset Atrial Flutter.   Assessment/Plan   1. RV failure/pulmonary hypertension:   - RHC (9/23) with RA 30, RV 107/20-30, PA 107/48 (69), unable to wedge; LVEDP 15. AO 140/64. LVEDP ~16. Assuming a PCWP of 20, TPG would be 49 w/ PVR of 7.  Echo 3/24 with RV severely enlarged w/ severely reduced RV function.  Cardiac MRI in 5/24 with EF 75%, moderate RV dilation with RV EF 50%, moderate TR, mid-wall basal septal/basal inferolateral LGE.  Could be seen with cardiac sarcoidosis (vs prior myocarditis).  CT chest showed RUL nodule (negative on PET) and mediastinal LAN.  V/Q scan not suggestive of chronic PEs.  She has been on sildenafil 20 tid as outpatient. RHC done 5/16  showed severe PAH with low CI 1.73, PAPi 1.5, RA pressure 32 (R>>L heart failure). TEE this admission with LV EF 60-65%, D-shaped septum, moderate RV dilation with moderate RV dysfunction, mod-severe TR.  Possible group 1 PH with OSA contributing.  - Now on Milrinone 0.25 with co-ox 66%.   - CVP trending down 13-14 today.  Continue lasix gtt at 15 + 5 mg metolazone BID today.  -Continue milrinone 0.25 mcg/kg/min for RV support until fully diuresed - Continue sildenafil 20 tid. .  - Sent autoimmune serologies => RF elevated, ANA/anti-centromere/SCL-70 negative.  - Needs eventual cardiac PET to assess for cardiac sarcoidosis.   - Severe OSA, needs CPAP. Continues to refuse.  2. Syncope:  -  Suspect this was due to severe PH/RV failure.  No arrhythmias noted on telemetry.   3. Atrial flutter:  - New diagnosis.  V-rates mildly elevated low 100s.  TEE-guided DCCV to NSR 05/17 but AFL recurred.  Amiodarone gtt started for rate control and to keep her in NSR after repeat DCCV.   - Will need to get her fully diuresed and off milrinone, then repeat DCCV.  - Set up later this week.  - Continue apixaban. - Continue amiodarone gtt   4. CAD:  - Remote PCI LAD and LCx.  - Continue statin.  - Stopped ASA with anticoagulation.   5. CKD stage 3:  Creatinine baseline previously ~1.4 - creatinine stable 2.2.  Watch with diuresis.   6. Hypokalemia/hypomagnesemia: Supp K. Check Mag     Tonye Becket NP-C  10/03/2022 7:08 AM  Patient seen and examined with the above-signed Advanced Practice Provider and/or Housestaff. I personally reviewed laboratory data, imaging studies and relevant notes. I independently examined the patient and formulated the important aspects of the plan. I have edited the note to reflect any of my changes or salient points. I have personally discussed the plan with the patient and/or family.  Remains on lasix gtt and milrinone. As well as IV amio.   Remains in AFL. Rates improved.    CVP 13-14. Got a bit lightheaded when walking  Fluid intake > 2.5 L  General:  Obese woman sitting in chair No resp difficulty HEENT: normal Neck: supple. Thick unable to see JVP . Carotids 2+ bilat; no bruits. No lymphadenopathy or thryomegaly appreciated. Cor: irrregular 2/6 TR Lungs: clear Abdomen: obese soft, nontender, nondistended. Good bowel sounds. Extremities: no cyanosis, clubbing, rash, 1+  edema Neuro: alert & orientedx3, cranial nerves grossly intact. moves all 4 extremities w/o difficulty. Affect pleasant  She has end-stage PH/cor pulmonale due to OHS/OSA and Group 1 PAH.  Would continue IV lasix one more day then.switch to po torsemide. Increase sildenafil to 40 tid. Drop milrinone to 0.125. Repeat DC-CV later this week.   Would add ERA as tolerated as outpatient. Drastically needs weight loss and treatment of OSA. She had recent outpatient sleep study and is pending CPAP but has refused in house. I have encourged her to use. Will need GLP1RA for weight loss. Referred to outpatient PharmD.   Arvilla Meres, MD  11:38 AM

## 2022-10-03 NOTE — Progress Notes (Addendum)
Progress Note   Patient: Sandra Schneider ZOX:[id number] DOB: [birth year] DOA: 09/26/2022     7 DOS: the patient was seen and examined on 10/03/2022   Brief hospital course: Sandra Schneider was admitted to the hospital with the working diagnosis of heart failure exacerbation.   67 yo female with the past medical history of heart failure, CKD stage 3b, coronary artery disease, breast cancer, T2DM, and hypertension who presented with dyspnea. Patient had several weeks or worsening dyspnea and lower extremity edema. Out patient follow up with cardiology on the day of admission she was found volume overloaded and recommended ER evaluation. On her initial physical examination her blood pressure was 120/80, HR 80, RR 23 and 02 saturation 95%, lungs with no wheezing or rales, heart with S1 and S2 present, irregularly irregular with no gallops, rubs or murmurs, abdomen with no distention, and bilateral lower extremity edema.   Na 137, K 4,5 Cl 98, bicarbonate 26, glucose 153, bun 59, cr 2.2  BNP 2,671  High sensitive troponin 48 and 60  Wbc 7,0 hgb 12,5 plt 250   Chest radiograph with cardiomegaly, bilateral hilar vascular congestion, bilateral interstitial infiltrates. Right upper lobe nodule.   EKG 80 bpm, normal axis, normal intervals, atrial flutter (typical) 3:1 conduction, with no significant ST segment changes, negative T wave lead II, III, AvF, V4 to V6.   05/14 bradycardic episode, 50 bpm, with decreased responsiveness/ syncope. Improved with chest compression. (No cardiac arrest).  05/16 right heart catheterization with severe pulmonary hypertension.  05/17 TEE cardioversion.  05/18 patient back in atrial flutter, she has been placed on milrinone for RV failure with low output.  05/19 continue atrial flutter.  05/20 atrial fibrillation on telemetry monitor.  05/21 continue diuresing, will likely need repeat direct current cardioversion when more euvolemic.   Assessment and Plan: *  Acute on chronic diastolic CHF (congestive heart failure) (HCC) Echocardiogram with preserved LV systolic function EF >75%, interventricular septum is flattened in systole and diastole. RV with severe enlargement, RV systolic function with moderate reduction, RVSP 45.9 mmHg. TR mild to moderate.   Acute on chronic core pulmonale.  Pulmonary hypertension (probably type 5).  RV failure   V/Q scan with low probability for pulmonary embolism.   05/16 cardiac catheterization  PA 93/44 mean 66 PCWP mean 19 Cardiac output 3,9 and index 1,73 PVR 11,8   Mainly precapillary pulmonary hypertension, with mild left heart failure.   SV02 66.3  Urine output 1,325  ml Systolic blood pressure 151 to 126 mmHg.   Sequential nephron blockade with:   Sp Acetazolamide.  Furosemide drip 15 mg per hr.   Metolazone on 05/16, 05/17,05/18, 05/20, 05/21.   Inotropic support with milrinone for low output heart failure.   Sildenafil for pulmonary hypertension.   Persistent atrial fibrillation (HCC) Rate controlled atrial flutter.  Anticoagulation with heparin drip 05/18 failed direct current cardioversion. (TEE).   Patient is back in atrial fibrillation on telemetry. Continue with IV amiodarone.  Continue anticoagulation apixaban.  May need second cardioversion when more euvolemic and off milrinone drip.  Stage 4 chronic kidney disease (HCC) AKI, hyponatremia.   Renal function with serum cr at 2,2 with K at 3,8 and serum bicarbonate at 30. Na is 129 and Mg 2.2   Add kcl and  mag today.  Follow up renal function in am.   Essential (primary) hypertension Blood pressure has improved, continue milrinone for inotropic support.   Type 2 diabetes mellitus with hyperglycemia (HCC) Uncontrolled T2Dm with  hyperglycemia.  Fasting glucose today is 218 mg/dl. Continue glucose cover and monitoring with insulin sliding scale.  Add basal insulin 5 units for now, patient on aggressive diuresis and risk of  worsening GFR.   Malignant neoplasm of upper-outer quadrant of right breast in female, estrogen receptor positive (HCC) Follow up as outpatient.   Class 3 obesity (HCC) Calculated BMI is 47,3   Solitary pulmonary nodule on lung CT Follow up as outpatient.         Subjective: Patient with improvement of her symptoms but not back yet to her baseline.   Physical Exam: Vitals:   10/03/22 1300 10/03/22 1400 10/03/22 1500 10/03/22 1513  BP:    (!) 151/77  Pulse: 99 (!) 101 96 98  Resp: 17 17 16 16   Temp:    97.8 F (36.6 C)  TempSrc:    Oral  SpO2: 94% 92% 94%   Weight:      Height:       Neurology awake and alert ENT with mild pallor Cardiovascular with S1 and S2 present irregularly irregular with no gallops, rubs or murmurs Mild JVD Trace lower extremity edema Respiratory with mild rales at bases with no wheezing or rhonchi Abdomen with no distention  Data Reviewed:    Family Communication: no family at the bedside   Disposition: Status is: Inpatient Remains inpatient appropriate because: heart failure   Planned Discharge Destination: Home    Author: Coralie Keens, MD 10/03/2022 3:34 PM  For on call review [email or web address].

## 2022-10-03 NOTE — Progress Notes (Signed)
Placed patient on CPAP for the night with oxygen set at 4lpm ° °

## 2022-10-04 DIAGNOSIS — I10 Essential (primary) hypertension: Secondary | ICD-10-CM | POA: Diagnosis not present

## 2022-10-04 DIAGNOSIS — I4819 Other persistent atrial fibrillation: Secondary | ICD-10-CM | POA: Diagnosis not present

## 2022-10-04 DIAGNOSIS — I5033 Acute on chronic diastolic (congestive) heart failure: Secondary | ICD-10-CM | POA: Diagnosis not present

## 2022-10-04 DIAGNOSIS — N184 Chronic kidney disease, stage 4 (severe): Secondary | ICD-10-CM | POA: Diagnosis not present

## 2022-10-04 LAB — BASIC METABOLIC PANEL
Anion gap: 12 (ref 5–15)
BUN: 45 mg/dL — ABNORMAL HIGH (ref 8–23)
CO2: 29 mmol/L (ref 22–32)
Calcium: 9.2 mg/dL (ref 8.9–10.3)
Chloride: 88 mmol/L — ABNORMAL LOW (ref 98–111)
Creatinine, Ser: 2.21 mg/dL — ABNORMAL HIGH (ref 0.44–1.00)
GFR, Estimated: 24 mL/min — ABNORMAL LOW (ref >60)
Glucose, Bld: 213 mg/dL — ABNORMAL HIGH (ref 70–99)
Potassium: 3.8 mmol/L (ref 3.5–5.1)
Sodium: 129 mmol/L — ABNORMAL LOW (ref 135–145)

## 2022-10-04 LAB — COOXEMETRY PANEL
Carboxyhemoglobin: 2.1 % — ABNORMAL HIGH (ref 0.5–1.5)
Methemoglobin: <0.7 % (ref 0.0–1.5)
O2 Saturation: 70.8 %
Total hemoglobin: 11.3 g/dL — ABNORMAL LOW (ref 12.0–16.0)

## 2022-10-04 LAB — CBC
HCT: 33.3 % — ABNORMAL LOW (ref 36.0–46.0)
Hemoglobin: 10.6 g/dL — ABNORMAL LOW (ref 12.0–15.0)
MCH: 31.4 pg (ref 26.0–34.0)
MCHC: 31.8 g/dL (ref 30.0–36.0)
MCV: 98.5 fL (ref 80.0–100.0)
Platelets: 156 10*3/uL (ref 150–400)
RBC: 3.38 MIL/uL — ABNORMAL LOW (ref 3.87–5.11)
RDW: 14.9 % (ref 11.5–15.5)
WBC: 6.2 10*3/uL (ref 4.0–10.5)
nRBC: 0 % (ref 0.0–0.2)

## 2022-10-04 LAB — GLUCOSE, CAPILLARY
Glucose-Capillary: 195 mg/dL — ABNORMAL HIGH (ref 70–99)
Glucose-Capillary: 257 mg/dL — ABNORMAL HIGH (ref 70–99)
Glucose-Capillary: 191 mg/dL — ABNORMAL HIGH (ref 70–99)
Glucose-Capillary: 185 mg/dL — ABNORMAL HIGH (ref 70–99)
Glucose-Capillary: 221 mg/dL — ABNORMAL HIGH (ref 70–99)

## 2022-10-04 LAB — MAGNESIUM: Magnesium: 2.2 mg/dL (ref 1.7–2.4)

## 2022-10-04 MED ORDER — POTASSIUM CHLORIDE CRYS ER 20 MEQ PO TBCR
40.0000 meq | EXTENDED_RELEASE_TABLET | ORAL | Status: AC
  Administered 2022-10-04 (×3): 40 meq via ORAL
  Filled 2022-10-04 (×3): qty 2

## 2022-10-04 MED ORDER — INSULIN GLARGINE-YFGN 100 UNIT/ML ~~LOC~~ SOLN
10.0000 [IU] | Freq: Every day | SUBCUTANEOUS | Status: DC
  Administered 2022-10-04 – 2022-10-06 (×3): 10 [IU] via SUBCUTANEOUS
  Filled 2022-10-04 (×5): qty 0.1

## 2022-10-04 MED ORDER — INSULIN ASPART 100 UNIT/ML IJ SOLN
0.0000 [IU] | Freq: Three times a day (TID) | INTRAMUSCULAR | Status: DC
  Administered 2022-10-04 – 2022-10-05 (×4): 2 [IU] via SUBCUTANEOUS
  Administered 2022-10-05: 1 [IU] via SUBCUTANEOUS
  Administered 2022-10-06: 2 [IU] via SUBCUTANEOUS
  Administered 2022-10-06: 3 [IU] via SUBCUTANEOUS
  Administered 2022-10-06: 2 [IU] via SUBCUTANEOUS

## 2022-10-04 MED ORDER — ROPINIROLE HCL 1 MG PO TABS
0.5000 mg | ORAL_TABLET | Freq: Three times a day (TID) | ORAL | Status: AC
  Administered 2022-10-04 (×3): 0.5 mg via ORAL
  Filled 2022-10-04 (×3): qty 1

## 2022-10-04 MED ORDER — METOLAZONE 2.5 MG PO TABS
5.0000 mg | ORAL_TABLET | Freq: Two times a day (BID) | ORAL | Status: AC
  Administered 2022-10-04 (×2): 5 mg via ORAL
  Filled 2022-10-04 (×3): qty 2

## 2022-10-04 MED ORDER — SODIUM CHLORIDE 0.9 % IV SOLN: INTRAVENOUS | Status: DC

## 2022-10-04 NOTE — Progress Notes (Signed)
TR[facility] night cross cover note:   I was notified by RN that patient slide out of her chair on to her backside.  Did not hit her head and did not lose consciousness.  No associated any mental status changes.  Not complaining of any acute arthralgias or myalgias. Does not appear to be on any blood thinners.   As the patient did not hit her head or have any mental status changes/ LOC associated with this, and has not visible injury while also not c/o any acute arthralgias or myalgias, will refrain from pursuit of imaging at this time.  I placed order for fall precautions.    Newton Pigg, DO Hospitalist

## 2022-10-04 NOTE — Progress Notes (Addendum)
Progress Note   Patient: Sandra Schneider NWG:[id number] DOB: 01-Aug-1955 DOA: 09/26/2022     8 DOS: the patient was seen and examined on 10/04/2022   Brief hospital course: Sandra Schneider was admitted to the hospital with the working diagnosis of heart failure exacerbation.   67 yo female with the past medical history of heart failure, CKD stage 3b, coronary artery disease, breast cancer, T2DM, and hypertension who presented with dyspnea. Patient had several weeks or worsening dyspnea and lower extremity edema. Out patient follow up with cardiology on the day of admission she was found volume overloaded and recommended ER evaluation. On her initial physical examination her blood pressure was 120/80, HR 80, RR 23 and 02 saturation 95%, lungs with no wheezing or rales, heart with S1 and S2 present, irregularly irregular with no gallops, rubs or murmurs, abdomen with no distention, and bilateral lower extremity edema.   Na 137, K 4,5 Cl 98, bicarbonate 26, glucose 153, bun 59, cr 2.2  BNP 2,671  High sensitive troponin 48 and 60  Wbc 7,0 hgb 12,5 plt 250   Chest radiograph with cardiomegaly, bilateral hilar vascular congestion, bilateral interstitial infiltrates. Right upper lobe nodule.   EKG 80 bpm, normal axis, normal intervals, atrial flutter (typical) 3:1 conduction, with no significant ST segment changes, negative T wave lead II, III, AvF, V4 to V6.   05/14 bradycardic episode, 50 bpm, with decreased responsiveness/ syncope. Improved with chest compression. (No cardiac arrest).  05/16 right heart catheterization with severe pulmonary hypertension.  05/17 TEE cardioversion.  05/18 patient back in atrial flutter, she has been placed on milrinone for RV failure with low output.  05/19 continue atrial flutter.  05/20 atrial fibrillation on telemetry monitor.  05/21 continue diuresing, will likely need repeat direct current cardioversion when more euvolemic.  05/22 continue diuresis with  furosemide and inotropic support with milrinone. She fell out of the chair yesterday with no head trauma. No syncope.   Assessment and Plan: * Acute on chronic diastolic CHF (congestive heart failure) (HCC) Echocardiogram with preserved LV systolic function EF >75%, interventricular septum is flattened in systole and diastole. RV with severe enlargement, RV systolic function with moderate reduction, RVSP 45.9 mmHg. TR mild to moderate.   Acute on chronic core pulmonale.  Pulmonary hypertension (probably type 5 or type 1).  RV failure   V/Q scan with low probability for pulmonary embolism.   05/16 cardiac catheterization  PA 93/44 mean 66 PCWP mean 19 Cardiac output 3,9 and index 1,73 PVR 11,8   Mainly precapillary pulmonary hypertension, with mild left heart failure.   SV02 70 Urine output 2,250  ml Systolic blood pressure 155 to 147 mmHg.   Sequential nephron blockade with:  Sp Acetazolamide.  Furosemide drip 15 mg per hr.   Metolazone on 05/16, 05/17,05/18, 05/20, 05/21/ 05/22.   Inotropic support with milrinone for low output heart failure.   Sildenafil for pulmonary hypertension.   Persistent atrial fibrillation (HCC) Rate controlled atrial flutter -fibrillation.  Anticoagulation with heparin drip 05/18 failed direct current cardioversion. (TEE).   Currently still in atrial fibrillation rhythm.  Continue with IV amiodarone.  Continue anticoagulation apixaban.  May need second cardioversion when more euvolemic and off milrinone drip.  Stage 4 chronic kidney disease (HCC) AKI, hyponatremia.   Renal function today with serum cr at 2,21 with K at 3,8 and serum bicarbonate at 29, Na 129. Mg 2.2   Continue K correction with Kcl. Follow up renal function in am.   Essential (  primary) hypertension Blood pressure has improved, continue milrinone for inotropic support.   Type 2 diabetes mellitus with hyperglycemia (HCC) Uncontrolled T2Dm with hyperglycemia.  Fasting  glucose today is 213 mg/dl. Continue glucose cover and monitoring with insulin sliding scale.  Increase basal insulin to 10 units for now, patient on aggressive diuresis and risk of worsening GFR.   Malignant neoplasm of upper-outer quadrant of right breast in female, estrogen receptor positive (HCC) Follow up as outpatient.   Class 3 obesity (HCC) Calculated BMI is 47,3   Restless legs syndrome, will do a trial or ropinarole.   Solitary pulmonary nodule on lung CT Follow up as outpatient.         Subjective: Patient is feeling better, she fell from her chair yesterday with no syncope or head trauma. She is having persistent restless legs.   Physical Exam: Vitals:   10/04/22 0504 10/04/22 0531 10/04/22 0750 10/04/22 0817  BP:  113/79 (!) 155/101 (!) 147/71  Pulse: 96 92 100 95  Resp: 16 19 (!) 31 16  Temp:  97.7 F (36.5 C)  97.9 F (36.6 C)  TempSrc:  Oral Oral   SpO2: 95% 92%  91%  Weight:      Height:       Neurology awake and alert ENT with mild pallor Cardiovascular with S1 and S2 present, irregularly irregular with no gallops, rubs or murmurs Mild JVD Trace pitting lower extremity edema Respiratory with mild rales at bases with no wheezing Abdomen with no distention  Data Reviewed:    Family Communication: no family at the bedside   Disposition: Status is: Inpatient Remains inpatient appropriate because: heart failure   Planned Discharge Destination: Home     Author: Coralie Keens, MD 10/04/2022 9:42 AM  For on call review [email or web address].

## 2022-10-04 NOTE — Inpatient Diabetes Management (Signed)
Inpatient Diabetes Program Recommendations  AACE/ADA: New Consensus Statement on Inpatient Glycemic Control (2015)  Target Ranges:  Prepandial:   less than 140 mg/dL      Peak postprandial:   less than 180 mg/dL (1-2 hours)      Critically ill patients:  140 - 180 mg/dL   Lab Results  Component Value Date   GLUCAP 257 (H) 10/04/2022   HGBA1C 5.4 09/08/2022    Latest Reference Range & Units 10/03/22 06:27 10/03/22 11:21 10/03/22 16:10 10/03/22 21:33 10/04/22 06:09 10/04/22 08:22  Glucose-Capillary 70 - 99 mg/dL 161 (H) Novolog 3 units 234 (H) Novolog 5 units 202 (H) Novolog 5 units 230 (H) Novolog 2 units 195 (H) 257 (H) Novolog 8 units  (H): Data is abnormally high  Diabetes history: DM2 Outpatient Diabetes medications: Lantus 10 units Current orders for Inpatient glycemic control: Semglee 5 units q hs, Novolog 0-15 units tid, 0-5 units hs  Inpatient Diabetes Program Recommendations:   Patient has received Novolog 20 units correction over the past 24 hrs. Please consider: -Increase Semglee to 10 units -Decrease Novolog 0-9 units tid, 0-5 units hs   Thank you, Darel Hong E. Bailey Kolbe, RN, MSN, CDE  Diabetes Coordinator Inpatient Glycemic Control Team Team Pager [phone] (8am-5pm) 10/04/2022 9:14 AM

## 2022-10-04 NOTE — Progress Notes (Addendum)
Patient ID: Sandra Schneider, female   DOB: 1955-05-26, 67 y.o.   MRN: [id number]     Advanced Heart Failure Rounding Note  PCP-Cardiologist: Dina Rich, MD   Subjective:   Admitted with marked volume overload and new atrial flutter. 5/15 had syncopal episode. Heart rate 50. Unresponsive. Had brief CPR.  5/16 RHC markedly elevated RH filling pressures w/ low but not markedly low PAPi, mildly elevated PCWP, severe PAH and low CO w/ CI of 1.73 =>started on milrinone and Lasix gtt  5/17 TEE-guided DCCV to NSR but atrial flutter recurred later that day.  5/21 Milrinone decreased to 0.125. Sildenafil increased to 40 TID  CO-OX 71% on milrinone 0.125.  CVP 20. Continues to diurese with lasix gtt at 15/hr + 5 mg metolazone BID. Is/Os not accurate. Wt down another 7 lb, 26 lb total.  Ambulated 400 feet with PT yesterday.  Used CPAP about 8 hrs last night.  RHC 5/16 Hemodynamics (mmHg) RA 32 RV 95/32 PA 93/44, mean 66 PCWP mean 19 Oxygen saturations: PA 49% AO 96% Cardiac Output (Fick) 3.98  Cardiac Index (Fick) 1.73 PVR 11.8 PAPi 1.5   VQ -low probability for PE.   TEE - LV EF 60-65%, D-shaped septum, moderate RV dilation with moderate RV dysfunction, mod-severe TR.    Objective:   Weight Range: 118.6 kg Body mass index is 43.51 kg/m.   Vital Signs:   Temp:  [97.1 F (36.2 C)-98.1 F (36.7 C)] 97.7 F (36.5 C) (05/22 0531) Pulse Rate:  [81-111] 92 (05/22 0531) Resp:  [15-20] 19 (05/22 0531) BP: (110-154)/(61-102) 113/79 (05/22 0531) SpO2:  [92 %-100 %] 92 % (05/22 0531) Weight:  [118.6 kg] 118.6 kg (05/22 0326) Last BM Date : 10/03/22  Weight change: Filed Weights   10/02/22 0520 10/03/22 0425 10/04/22 0326  Weight: 122.8 kg 121.6 kg 118.6 kg    Intake/Output:   Intake/Output Summary (Last 24 hours) at 10/04/2022 0658 Last data filed at 10/04/2022 0600 Gross per 24 hour  Intake 1307.25 ml  Output 2550 ml  Net -1242.75 ml     Physical Exam   General:  Sitting up in chair.  HEENT: normal Neck: supple. JVP to jaw. Carotids 2+ bilat; no bruits.  Cor: PMI nondisplaced. Irregular rate & rhythm. No rubs, gallops or murmurs. Lungs: clear Abdomen: obese, soft, nontender, nondistended.  Extremities: no cyanosis, clubbing, rash, 1-2+ edema Neuro: alert & orientedx3, cranial nerves grossly intact. moves all 4 extremities w/o difficulty. Affect pleasant   Telemetry   AFL 80s-90s  EKG    N./A  Labs    CBC Recent Labs    10/03/22 0415 10/04/22 0405  WBC 6.3 6.2  HGB 10.2* 10.6*  HCT 33.2* 33.3*  MCV 100.9* 98.5  PLT 152 156   Basic Metabolic Panel Recent Labs    78/29/56 0415 10/04/22 0405  NA 129* 129*  K 3.8 3.8  CL 87* 88*  CO2 30 29  GLUCOSE 218* 213*  BUN 46* 45*  CREATININE 2.22* 2.21*  CALCIUM 9.1 9.2  MG 2.2 2.2   Liver Function Tests No results for input(s): "AST", "ALT", "ALKPHOS", "BILITOT", "PROT", "ALBUMIN" in the last 72 hours. No results for input(s): "LIPASE", "AMYLASE" in the last 72 hours. Cardiac Enzymes No results for input(s): "CKTOTAL", "CKMB", "CKMBINDEX", "TROPONINI" in the last 72 hours.   BNP: BNP (last 3 results) Recent Labs    09/06/22 1611 09/20/22 1105 09/26/22 1318  BNP 1,807.9* 2,885.7* 2,617.1*    ProBNP (last 3 results) No  results for input(s): "PROBNP" in the last 8760 hours.   D-Dimer No results for input(s): "DDIMER" in the last 72 hours. Hemoglobin A1C No results for input(s): "HGBA1C" in the last 72 hours. Fasting Lipid Panel No results for input(s): "CHOL", "HDL", "LDLCALC", "TRIG", "CHOLHDL", "LDLDIRECT" in the last 72 hours. Thyroid Function Tests No results for input(s): "TSH", "T4TOTAL", "T3FREE", "THYROIDAB" in the last 72 hours.  Invalid input(s): "FREET3"   Other results:   Imaging    No results found.   Medications:     Scheduled Medications:  apixaban  5 mg Oral BID   atorvastatin  40 mg Oral Daily   Chlorhexidine Gluconate  Cloth  6 each Topical Daily   dorzolamide-timolol  1 drop Both Eyes BID   famotidine  20 mg Oral Daily   insulin aspart  0-15 Units Subcutaneous TID WC   insulin aspart  0-5 Units Subcutaneous QHS   insulin glargine-yfgn  5 Units Subcutaneous QHS   loratadine  10 mg Oral Daily   polyethylene glycol  17 g Oral Daily   sildenafil  40 mg Oral TID   sodium chloride flush  10-40 mL Intracatheter Q12H   sodium chloride flush  3 mL Intravenous Q12H   sodium chloride flush  3 mL Intravenous Q12H    Infusions:  amiodarone 30 mg/hr (10/04/22 0433)   furosemide (LASIX) 200 mg in dextrose 5 % 100 mL (2 mg/mL) infusion 15 mg/hr (10/03/22 1900)   milrinone 0.125 mcg/kg/min (10/03/22 1900)    PRN Medications: acetaminophen **OR** acetaminophen, benzonatate, hydrALAZINE, loperamide, ondansetron **OR** ondansetron (ZOFRAN) IV, mouth rinse, sodium chloride flush    Patient Profile     Sandra Schneider is a 67 year old with a history of CAD (NSTEMI 06/2012 w/ PCI to LAD and Lcx) LVEF 55% at that time, OSA, HTN, hyperlipidemia, hx of breast ca s/p lumpectomy and HFpEF.    Admitted with A/C HFpEF--RV Failure and new onset Atrial Flutter.   Assessment/Plan   1. RV failure/pulmonary hypertension:   - RHC (9/23) with RA 30, RV 107/20-30, PA 107/48 (69), unable to wedge; LVEDP 15. AO 140/64. LVEDP ~16. Assuming a PCWP of 20, TPG would be 49 w/ PVR of 7.  Echo 3/24 with RV severely enlarged w/ severely reduced RV function.  Cardiac MRI in 5/24 with EF 75%, moderate RV dilation with RV EF 50%, moderate TR, mid-wall basal septal/basal inferolateral LGE.  Could be seen with cardiac sarcoidosis (vs prior myocarditis).  CT chest showed RUL nodule (negative on PET) and mediastinal LAN.  V/Q scan not suggestive of chronic PEs.  She has been on sildenafil 20 tid as outpatient. RHC done 5/16 showed severe PAH with low CI 1.73, PAPi 1.5, RA pressure 32 (R>>L heart failure). TEE this admission with LV EF 60-65%, D-shaped  septum, moderate RV dilation with moderate RV dysfunction, mod-severe TR.  Possible group 1 PH with OSA contributing.  - CO-OX stable on milrinone 0.125 - volume looks better and down 26 lb. Still has some fluid on board but ? CVP still 20.  - Continue lasix gtt at 15hr + 5 mg metolazone BID. Hopefully can convert to po Torsemide tomorrow - Continue sildenafil 40 TID  - Consider ERA as outpatient - Sent autoimmune serologies => RF elevated, ANA/anti-centromere/SCL-70 negative.  - Needs eventual cardiac PET to assess for cardiac sarcoidosis.   - Moderate OSA on recent sleep study with severe O2 desats (low 70s), needs CPAP. Used for 8 hrs last night.  2. Syncope:  -  Suspect this was due to severe PH/RV failure.  No arrhythmias noted on telemetry.   3. Atrial flutter:  - New diagnosis.  V-rates mildly elevated low 100s.  TEE-guided DCCV to NSR 05/17 but AFL recurred.  Amiodarone gtt started for rate control and to keep her in NSR after repeat DCCV.   - Will need to get her fully diuresed and off milrinone, then repeat DCCV.  - Tentatively scheduled for tomorrow afternoon - Continue apixaban. - Continue amiodarone gtt   4. CAD:  - Remote PCI LAD and LCx.  - Continue statin.  - Stopped ASA with anticoagulation.   5. CKD stage 3:  - Creatinine baseline previously ~1.4 - Creatinine stable at 2.21. Follow closely with diuretics and RV failure.  6. Hypokalemia/hypomagnesemia: - Supp K today - Mag okay  7. Obesity: - BMI 43 - Referred to outpatient PharmD for GLP1RA to assist with weight loss  [facility], LINDSAY N NP-C  10/04/2022 6:58 AM  Patient seen and examined with the above-signed Advanced Practice Provider and/or Housestaff. I personally reviewed laboratory data, imaging studies and relevant notes. I independently examined the patient and formulated the important aspects of the plan. I have edited the note to reflect any of my changes or salient points. I have personally discussed  the plan with the patient and/or family.  Continue to diurese well on IV milrinone, lasix gtt and metolazone. Weight down 26 pounds. BP and renal function tolerating. CVP still high. Co-ox ok.   Remains in AFL despite IV amio. Wore CPAP last night.   General:  Sitting up. No resp difficulty HEENT: normal Neck: supple. JVP to ear  Carotids 2+ bilat; no bruits. No lymphadenopathy or thryomegaly appreciated. Cor: PMI nondisplaced. Irreg 2/6 TR Lungs: clear Abdomen: obese soft, nontender, nondistended. No hepatosplenomegaly. No bruits or masses. Good bowel sounds. Extremities: no cyanosis, clubbing, rash, 2+ edema Neuro: alert & orientedx3, cranial nerves grossly intact. moves all 4 extremities w/o difficulty. Affect pleasant  She has end-stage PH/cor pulmonale due to OHS/OSA and Group 1 PAH.   Continue diuresis as long as RV tolerates. Continue sildenafil and CPAP. Plan DC-CV tomorrow.   As outpatient focus on weight loss and CPAP/Bipap use.   Arvilla Meres, MD  8:42 AM

## 2022-10-04 NOTE — TOC Progression Note (Signed)
Transition of Care [facility]) - Progression Note    Patient Details  Name: Sandra Schneider MRN: [id number] Date of Birth: 02-08-1956  Transition of Care [facility]) CM/SW Contact  Elliot Cousin, RN Phone Number: [phone] 10/04/2022, 2:44 PM  Clinical Narrative:   HF TOC CM spoke to pt and sister at bedside. Offered choice for [facility], pt agreeable to [facility] for HH. Pt has 3n1 bedside in room delivered by [employer]. Will need HHRN order with F2F. Pt states her CPAP is being managed by [facility] in [place]. That put a rush on CPAP and states is should be deliver to her home soon. Pt may need oxygen for home. Will need walking oxygen saturation closer to time of dc for home oxygen.     Expected Discharge Plan: Home w Home Health Services Barriers to Discharge: Continued Medical Work up  Expected Discharge Plan and Services In-house Referral: NA Discharge Planning Services: CM Consult   Living arrangements for the past 2 months: Single Family Home                 DME Arranged: 3-N-1 DME Agency: [employer] Date DME Agency Contacted: 10/04/22 Time DME Agency Contacted: [phone] Representative spoke with at DME Agency: Vaughan Basta HH Arranged: RN HH Agency: [facility] Date [facility] Agency Contacted: 10/04/22 Time HH Agency Contacted: [phone] Representative spoke with at [facility] Agency: Tresa Endo   Social Determinants of Health (SDOH) Interventions SDOH Screenings   Food Insecurity: No Food Insecurity (09/27/2022)  Housing: Low Risk  (09/27/2022)  Transportation Needs: No Transportation Needs (09/27/2022)  Utilities: Not At Risk (09/27/2022)  Tobacco Use: Low Risk  (10/02/2022)    Readmission Risk Interventions     No data to display

## 2022-10-04 NOTE — Plan of Care (Signed)
  Problem: Education: Goal: Ability to demonstrate management of disease process will improve Outcome: Progressing Goal: Ability to verbalize understanding of medication therapies will improve Outcome: Progressing Goal: Individualized Educational Video(s) Outcome: Progressing   Problem: Activity: Goal: Capacity to carry out activities will improve Outcome: Progressing   Problem: Cardiac: Goal: Ability to achieve and maintain adequate cardiopulmonary perfusion will improve Outcome: Progressing   Problem: Education: Goal: Knowledge of disease or condition will improve Outcome: Progressing Goal: Understanding of medication regimen will improve Outcome: Progressing Goal: Individualized Educational Video(s) Outcome: Progressing   Problem: Activity: Goal: Ability to tolerate increased activity will improve Outcome: Progressing   Problem: Cardiac: Goal: Ability to achieve and maintain adequate cardiopulmonary perfusion will improve Outcome: Progressing   Problem: Health Behavior/Discharge Planning: Goal: Ability to safely manage health-related needs after discharge will improve Outcome: Progressing   Problem: Education: Goal: Knowledge of General Education information will improve Description: Including pain rating scale, medication(s)/side effects and non-pharmacologic comfort measures Outcome: Progressing   Problem: Health Behavior/Discharge Planning: Goal: Ability to manage health-related needs will improve Outcome: Progressing   Problem: Clinical Measurements: Goal: Ability to maintain clinical measurements within normal limits will improve Outcome: Progressing Goal: Diagnostic test results will improve Outcome: Progressing Goal: Respiratory complications will improve Outcome: Progressing Goal: Cardiovascular complication will be avoided Outcome: Progressing   Problem: Activity: Goal: Risk for activity intolerance will decrease Outcome: Progressing   Problem:  Nutrition: Goal: Adequate nutrition will be maintained Outcome: Progressing   Problem: Coping: Goal: Level of anxiety will decrease Outcome: Progressing   Problem: Elimination: Goal: Will not experience complications related to bowel motility Outcome: Progressing Goal: Will not experience complications related to urinary retention Outcome: Progressing   Problem: Pain Managment: Goal: General experience of comfort will improve Outcome: Progressing   Problem: Skin Integrity: Goal: Risk for impaired skin integrity will decrease Outcome: Progressing   Problem: Health Behavior/Discharge Planning: Goal: Ability to manage health-related needs will improve Outcome: Progressing   Problem: Clinical Measurements: Goal: Ability to maintain clinical measurements within normal limits will improve Outcome: Progressing Goal: Will remain free from infection Outcome: Progressing Goal: Diagnostic test results will improve Outcome: Progressing Goal: Respiratory complications will improve Outcome: Progressing Goal: Cardiovascular complication will be avoided Outcome: Progressing

## 2022-10-04 NOTE — Progress Notes (Signed)
Occupational Therapy Treatment Patient Details Name: Sandra Schneider MRN: [id number] DOB: 10-17-1955 Today's Date: 10/04/2022   History of present illness 67 y/o female adm 09/26/22 with dyspnea, volume overload and new Aflutter. 5/15 syncope in ED with brief CPR. 5/17 TEE with DCCV with return of Aflutter same day. PMhx: CHF, CKD, CAD, breast CA, T2DM, HTN   OT comments  Pt progressing towards goals this session, needing min guard- min A for ADLs, min guard for transfers with RW. SpO2 down to mid 80's when attempting to decrease O2 to 2L, remained in 90's on 4L O2. Pt presenting with impairments listed below, will follow acutely. Continue to recommend HHOT at d/c.     Recommendations for follow up therapy are one component of a multi-disciplinary discharge planning process, led by the attending physician.  Recommendations may be updated based on patient status, additional functional criteria and insurance authorization.    Assistance Recommended at Discharge Set up Supervision/Assistance  Patient can return home with the following  A little help with walking and/or transfers;A little help with bathing/dressing/bathroom;Help with stairs or ramp for entrance;Assistance with cooking/housework;Assist for transportation   Equipment Recommendations  BSC/3in1    Recommendations for Other Services      Precautions / Restrictions Precautions Precautions: Fall;Other (comment) Precaution Comments: watch SPO2 Restrictions Weight Bearing Restrictions: No       Mobility Bed Mobility               General bed mobility comments: in recliner upon arrival, left seated EOB with alarm on at end of session    Transfers Overall transfer level: Needs assistance Equipment used: Rolling walker (2 wheels) Transfers: Sit to/from Stand Sit to Stand: Min guard                 Balance Overall balance assessment: Mild deficits observed, not formally tested Sitting-balance support:  Bilateral upper extremity supported Sitting balance-Leahy Scale: Good     Standing balance support: Bilateral upper extremity supported Standing balance-Leahy Scale: Poor Standing balance comment: reliant on RW                           ADL either performed or assessed with clinical judgement   ADL Overall ADL's : Needs assistance/impaired     Grooming: Wash/dry hands;Set up;Standing Grooming Details (indicate cue type and reason): standing at sink         Upper Body Dressing : Standing;Minimal assistance Upper Body Dressing Details (indicate cue type and reason): min A for lines/leads Lower Body Dressing: Maximal assistance;Sitting/lateral leans   Toilet Transfer: Min guard;BSC/3in1;Ambulation;Rolling walker (2 wheels)           Functional mobility during ADLs: Min guard;Rolling walker (2 wheels)      Extremity/Trunk Assessment Upper Extremity Assessment Upper Extremity Assessment: Generalized weakness   Lower Extremity Assessment Lower Extremity Assessment: Defer to PT evaluation        Vision   Vision Assessment?: No apparent visual deficits   Perception Perception Perception: Not tested   Praxis Praxis Praxis: Not tested    Cognition Arousal/Alertness: Awake/alert Behavior During Therapy: WFL for tasks assessed/performed Overall Cognitive Status: Within Functional Limits for tasks assessed                                          Exercises      Shoulder Instructions  General Comments VSS on 4L O2    Pertinent Vitals/ Pain       Pain Assessment Pain Assessment: No/denies pain  Home Living                                          Prior Functioning/Environment              Frequency  Min 1X/week        Progress Toward Goals  OT Goals(current goals can now be found in the care plan section)  Progress towards OT goals: Progressing toward goals  Acute Rehab OT Goals Patient  Stated Goal: none stated OT Goal Formulation: With patient Time For Goal Achievement: 10/16/22 Potential to Achieve Goals: Good ADL Goals Pt Will Perform Grooming: with modified independence;standing Pt Will Perform Upper Body Bathing: with modified independence;sitting Pt Will Perform Lower Body Bathing: with modified independence;sit to/from stand;sitting/lateral leans Pt Will Transfer to Toilet: with modified independence;regular height toilet;bedside commode;ambulating Pt Will Perform Toileting - Clothing Manipulation and hygiene: with modified independence;sitting/lateral leans;sit to/from stand Pt/caregiver will Perform Home Exercise Program: Increased strength;Both right and left upper extremity;With theraband;Independently;With written HEP provided Additional ADL Goal #1: Pt will be educated and verbalize understanding of energy conservation techniques to utilize at home to increase her ability to complete BADL with less difficulty.  Plan Discharge plan remains appropriate;Frequency needs to be updated    Co-evaluation                 AM-PAC OT "6 Clicks" Daily Activity     Outcome Measure   Help from another person eating meals?: None Help from another person taking care of personal grooming?: A Little Help from another person toileting, which includes using toliet, bedpan, or urinal?: A Little Help from another person bathing (including washing, rinsing, drying)?: A Little Help from another person to put on and taking off regular upper body clothing?: A Little Help from another person to put on and taking off regular lower body clothing?: A Lot 6 Click Score: 18    End of Session Equipment Utilized During Treatment: Oxygen;Rolling walker (2 wheels);Gait belt  OT Visit Diagnosis: Unsteadiness on feet (R26.81);Muscle weakness (generalized) (M62.81)   Activity Tolerance Patient tolerated treatment well;Patient limited by fatigue   Patient Left in bed;with call  bell/phone within reach;with bed alarm set   Nurse Communication Mobility status        Time: 1610-9604 OT Time Calculation (min): 36 min  Charges: OT General Charges $OT Visit: 1 Visit OT Treatments $Self Care/Home Management : 8-22 mins $Therapeutic Activity: 8-22 mins  Carver Fila, OTD, OTR/L SecureChat Preferred Acute Rehab [phone]   [name] 10/04/2022, 10:39 AM

## 2022-10-04 NOTE — Progress Notes (Signed)
Pt placed on CPAP for night rest tolerating well  

## 2022-10-05 ENCOUNTER — Encounter (HOSPITAL_COMMUNITY)
Admission: EM | Disposition: A | Discharge status: Home / Self Care | Payer: Self-pay | Source: Home / Self Care | Attending: Internal Medicine

## 2022-10-05 ENCOUNTER — Inpatient Hospital Stay (HOSPITAL_COMMUNITY): Payer: Medicare Other | Admitting: Anesthesiology

## 2022-10-05 DIAGNOSIS — N189 Chronic kidney disease, unspecified: Secondary | ICD-10-CM

## 2022-10-05 DIAGNOSIS — Z794 Long term (current) use of insulin: Secondary | ICD-10-CM

## 2022-10-05 DIAGNOSIS — I2510 Atherosclerotic heart disease of native coronary artery without angina pectoris: Secondary | ICD-10-CM

## 2022-10-05 DIAGNOSIS — I4819 Other persistent atrial fibrillation: Secondary | ICD-10-CM | POA: Diagnosis not present

## 2022-10-05 DIAGNOSIS — I10 Essential (primary) hypertension: Secondary | ICD-10-CM | POA: Diagnosis not present

## 2022-10-05 DIAGNOSIS — E1122 Type 2 diabetes mellitus with diabetic chronic kidney disease: Secondary | ICD-10-CM

## 2022-10-05 DIAGNOSIS — N184 Chronic kidney disease, stage 4 (severe): Secondary | ICD-10-CM | POA: Diagnosis not present

## 2022-10-05 DIAGNOSIS — I130 Hypertensive heart and chronic kidney disease with heart failure and stage 1 through stage 4 chronic kidney disease, or unspecified chronic kidney disease: Secondary | ICD-10-CM

## 2022-10-05 DIAGNOSIS — I5033 Acute on chronic diastolic (congestive) heart failure: Secondary | ICD-10-CM | POA: Diagnosis not present

## 2022-10-05 DIAGNOSIS — I4892 Unspecified atrial flutter: Secondary | ICD-10-CM

## 2022-10-05 DIAGNOSIS — D631 Anemia in chronic kidney disease: Secondary | ICD-10-CM

## 2022-10-05 DIAGNOSIS — I509 Heart failure, unspecified: Secondary | ICD-10-CM

## 2022-10-05 HISTORY — PX: CARDIOVERSION: SHX1299

## 2022-10-05 LAB — CBC
HCT: 33.9 % — ABNORMAL LOW (ref 36.0–46.0)
Hemoglobin: 10.9 g/dL — ABNORMAL LOW (ref 12.0–15.0)
MCH: 31.6 pg (ref 26.0–34.0)
MCHC: 32.2 g/dL (ref 30.0–36.0)
MCV: 98.3 fL (ref 80.0–100.0)
Platelets: 155 10*3/uL (ref 150–400)
RBC: 3.45 MIL/uL — ABNORMAL LOW (ref 3.87–5.11)
RDW: 14.9 % (ref 11.5–15.5)
WBC: 5.2 10*3/uL (ref 4.0–10.5)
nRBC: 0 % (ref 0.0–0.2)

## 2022-10-05 LAB — COOXEMETRY PANEL
Carboxyhemoglobin: 2.4 % — ABNORMAL HIGH (ref 0.5–1.5)
Methemoglobin: <0.7 % (ref 0.0–1.5)
O2 Saturation: 72.4 %
Total hemoglobin: 11.3 g/dL — ABNORMAL LOW (ref 12.0–16.0)

## 2022-10-05 LAB — GLUCOSE, CAPILLARY
Glucose-Capillary: 165 mg/dL — ABNORMAL HIGH (ref 70–99)
Glucose-Capillary: 156 mg/dL — ABNORMAL HIGH (ref 70–99)
Glucose-Capillary: 136 mg/dL — ABNORMAL HIGH (ref 70–99)
Glucose-Capillary: 165 mg/dL — ABNORMAL HIGH (ref 70–99)
Glucose-Capillary: 152 mg/dL — ABNORMAL HIGH (ref 70–99)

## 2022-10-05 LAB — BASIC METABOLIC PANEL
Anion gap: 13 (ref 5–15)
BUN: 44 mg/dL — ABNORMAL HIGH (ref 8–23)
CO2: 31 mmol/L (ref 22–32)
Calcium: 9.5 mg/dL (ref 8.9–10.3)
Chloride: 88 mmol/L — ABNORMAL LOW (ref 98–111)
Creatinine, Ser: 2.38 mg/dL — ABNORMAL HIGH (ref 0.44–1.00)
GFR, Estimated: 22 mL/min — ABNORMAL LOW (ref >60)
Glucose, Bld: 161 mg/dL — ABNORMAL HIGH (ref 70–99)
Potassium: 3.9 mmol/L (ref 3.5–5.1)
Sodium: 132 mmol/L — ABNORMAL LOW (ref 135–145)

## 2022-10-05 LAB — MAGNESIUM: Magnesium: 2.1 mg/dL (ref 1.7–2.4)

## 2022-10-05 LAB — PROTIME-INR
INR: 1.9 — ABNORMAL HIGH (ref 0.8–1.2)
Prothrombin Time: 22.2 seconds — ABNORMAL HIGH (ref 11.4–15.2)

## 2022-10-05 SURGERY — CARDIOVERSION
Anesthesia: General

## 2022-10-05 MED ORDER — PHENYLEPHRINE HCL (PRESSORS) 10 MG/ML IV SOLN
INTRAVENOUS | Status: DC | PRN
  Administered 2022-10-05: 80 ug via INTRAVENOUS
  Administered 2022-10-05 (×2): 160 ug via INTRAVENOUS
  Administered 2022-10-05: 80 ug via INTRAVENOUS

## 2022-10-05 MED ORDER — POTASSIUM CHLORIDE CRYS ER 20 MEQ PO TBCR
40.0000 meq | EXTENDED_RELEASE_TABLET | Freq: Two times a day (BID) | ORAL | Status: AC
  Administered 2022-10-05 (×2): 40 meq via ORAL
  Filled 2022-10-05 (×2): qty 2

## 2022-10-05 MED ORDER — PROPOFOL 10 MG/ML IV BOLUS
INTRAVENOUS | Status: DC | PRN
  Administered 2022-10-05 (×3): 10 mg via INTRAVENOUS
  Administered 2022-10-05: 50 mg via INTRAVENOUS
  Administered 2022-10-05 (×2): 10 mg via INTRAVENOUS

## 2022-10-05 MED ORDER — LIDOCAINE 2% (20 MG/ML) 5 ML SYRINGE
INTRAMUSCULAR | Status: DC | PRN
  Administered 2022-10-05: 50 mg via INTRAVENOUS

## 2022-10-05 MED ORDER — TORSEMIDE 20 MG PO TABS
40.0000 mg | ORAL_TABLET | Freq: Two times a day (BID) | ORAL | Status: DC
  Administered 2022-10-05 (×2): 40 mg via ORAL
  Filled 2022-10-05 (×2): qty 2

## 2022-10-05 MED ORDER — ROPINIROLE HCL 1 MG PO TABS
0.5000 mg | ORAL_TABLET | Freq: Once | ORAL | Status: AC
  Administered 2022-10-05: 0.5 mg via ORAL
  Filled 2022-10-05: qty 1

## 2022-10-05 SURGICAL SUPPLY — 1 items: ELECT DEFIB PAD ADLT CADENCE (PAD) ×1 IMPLANT

## 2022-10-05 NOTE — Interval H&P Note (Signed)
History and Physical Interval Note:  10/05/2022 1:04 PM  Sandra Schneider  has presented today for surgery, with the diagnosis of AFIB.  The various methods of treatment have been discussed with the patient and family. After consideration of risks, benefits and other options for treatment, the patient has consented to  Procedure(s): CARDIOVERSION (N/A) as a surgical intervention.  The patient's history has been reviewed, patient examined, no change in status, stable for surgery.  I have reviewed the patient's chart and labs.  Questions were answered to the patient's satisfaction.     Elias Bordner

## 2022-10-05 NOTE — Transfer of Care (Signed)
Immediate Anesthesia Transfer of Care Note  Patient: Sandra Schneider Mask  Procedure(s) Performed: CARDIOVERSION  Patient Location: Cath Lab  Anesthesia Type:General  Level of Consciousness: awake, alert , and oriented  Airway & Oxygen Therapy: Patient Spontanous Breathing and Patient connected to nasal cannula oxygen  Post-op Assessment: Report given to RN, Post -op Vital signs reviewed and stable, Patient moving all extremities X 4, and Patient able to stick tongue midline  Post vital signs: Reviewed  Last Vitals:  Vitals Value Taken Time  BP 143/74 10/05/22 1332  Temp 98.6   Pulse 70 10/05/22 1334  Resp 18 10/05/22 1334  SpO2 88 % 10/05/22 1334  Vitals shown include unvalidated device data.  Last Pain:  Vitals:   10/05/22 1330  TempSrc:   PainSc: 0-No pain      Patients Stated Pain Goal: 0 (09/29/22 1740)  Complications: No notable events documented.

## 2022-10-05 NOTE — Anesthesia Preprocedure Evaluation (Signed)
Anesthesia Evaluation  Patient identified by MRN, date of birth, ID band Patient awake    Reviewed: Allergy & Precautions, NPO status , Patient's Chart, lab work & pertinent test results  History of Anesthesia Complications Negative for: history of anesthetic complications  Airway Mallampati: III  TM Distance: >3 FB Neck ROM: Full    Dental  (+) Upper Dentures, Lower Dentures   Pulmonary neg shortness of breath, sleep apnea (has not received CPAP yet) , neg COPD, neg recent URI Pulmonary nodule   Pulmonary exam normal breath sounds clear to auscultation       Cardiovascular hypertension, Pt. on medications and Pt. on home beta blockers pulmonary hypertension(-) angina + CAD, + Past MI (2014), + Cardiac Stents, +CHF and + DOE  + dysrhythmias Atrial Fibrillation  Rhythm:Regular Rate:Normal  HLD  RHC 09/28/2022: 1. Markedly elevated right heart filling pressures with low but not markedly low PAPi.   2. Mildly elevated PCWP 3. Severe pulmonary arterial hypertension.  4. Low cardiac output.   TTE 09/08/2022: IMPRESSIONS     1. Limited study.   2. Left ventricular ejection fraction, by estimation, is >75%. The left  ventricle has hyperdynamic function. The left ventricle has no regional  wall motion abnormalities. There is the interventricular septum is  flattened in systole and diastole,  consistent with right ventricular pressure and volume overload.   3. Right ventricular systolic function is moderately reduced. The right  ventricular size is severely enlarged. There is moderately elevated  pulmonary artery systolic pressure. The estimated right ventricular  systolic pressure is 45.9 mmHg.   4. The mitral valve is degenerative. Trivial mitral valve regurgitation.  Moderate mitral annular calcification.   5. Tricuspid valve regurgitation is mild to moderate.   6. The aortic valve is tricuspid. There is mild calcification of  the  aortic valve. Aortic valve regurgitation is not visualized.   7. The inferior vena cava is dilated in size with <50% respiratory  variability, suggesting right atrial pressure of 15 mmHg.   R/LHC 02/07/2022:   Prox Cx to Dist Cx lesion is 25% stenosed.   Mid LAD lesion is 25% stenosed.   2nd Mrg lesion is 100% stenosed.  Right to left collaterals.  Left to left collaterals.   The left ventricular systolic function is normal.   LV end diastolic pressure is mildly elevated.   The left ventricular ejection fraction is greater than 65% by visual estimate.   Hemodynamic findings consistent with severe pulmonary hypertension.   There is no aortic valve stenosis.   Aortic saturation 96% on 3 L nasal cannula, PA saturation 69% on 3 L, mean RA pressure 32 mm Hg; PA pressure 107/48, mean PA pressure 69 mmHg, pulmonary capillary wedge pressure difficult to assess due to significant V waves, and difficulty wedging.  Cardiac output 7.03 L/min, cardiac index 3.07.   Patent stents with mild restenosis in the LAD and circumflex.  Small obtuse marginal appears occluded distally with some right to left collaterals.  No target for PCI.      Neuro/Psych neg Seizures Glaucoma    GI/Hepatic Neg liver ROS,GERD  ,,  Endo/Other  diabetes, Type 2, Insulin Dependent  Morbid obesity  Renal/GU CRFRenal disease  negative genitourinary   Musculoskeletal   Abdominal  (+) + obese  Peds  Hematology  (+) Blood dyscrasia, anemia Eliquis therapy- last dose this am   Anesthesia Other Findings   Reproductive/Obstetrics  Anesthesia Physical Anesthesia Plan  ASA: 4  Anesthesia Plan: General   Post-op Pain Management:    Induction: Intravenous  PONV Risk Score and Plan: 3 and Treatment may vary due to age or medical condition and Propofol infusion  Airway Management Planned: Natural Airway and Nasal Cannula  Additional Equipment:  None  Intra-op Plan:   Post-operative Plan:   Informed Consent: I have reviewed the patients History and Physical, chart, labs and discussed the procedure including the risks, benefits and alternatives for the proposed anesthesia with the patient or authorized representative who has indicated his/her understanding and acceptance.     Dental advisory given  Plan Discussed with: Anesthesiologist and CRNA  Anesthesia Plan Comments: (  )        Anesthesia Quick Evaluation

## 2022-10-05 NOTE — Progress Notes (Addendum)
Patient ID: Waylynn Baltes, female   DOB: 19-Oct-1955, 67 y.o.   MRN: [id number]     Advanced Heart Failure Rounding Note  PCP-Cardiologist: Dina Rich, MD   Subjective:   Admitted with marked volume overload and new atrial flutter. 5/15 had syncopal episode. Heart rate 50. Unresponsive. Had brief CPR.  5/16 RHC markedly elevated RH filling pressures w/ low but not markedly low PAPi, mildly elevated PCWP, severe PAH and low CO w/ CI of 1.73 =>started on milrinone and Lasix gtt  5/17 TEE-guided DCCV to NSR but atrial flutter recurred later that day.  5/21 Milrinone decreased to 0.125. Sildenafil increased to 40 TID  CO-OX 72% on milrinone 0.125  CVP 14. Is/Os incomplete. Weight down another 5 lb, 31 lb total.  Scr [id number]  Dyspnea and leg edema improved. Tired of getting up frequently to void.  RHC 5/16 Hemodynamics (mmHg) RA 32 RV 95/32 PA 93/44, mean 66 PCWP mean 19 Oxygen saturations: PA 49% AO 96% Cardiac Output (Fick) 3.98  Cardiac Index (Fick) 1.73 PVR 11.8 PAPi 1.5   VQ -low probability for PE.   TEE - LV EF 60-65%, D-shaped septum, moderate RV dilation with moderate RV dysfunction, mod-severe TR.    Objective:   Weight Range: 116.2 kg Body mass index is 42.63 kg/m.   Vital Signs:   Temp:  [97.6 F (36.4 C)-98.3 F (36.8 C)] 97.9 F (36.6 C) (05/23 0721) Pulse Rate:  [78-96] 80 (05/23 0721) Resp:  [14-20] 20 (05/23 0721) BP: (117-153)/(43-93) 117/58 (05/23 0721) SpO2:  [91 %-98 %] 98 % (05/23 0416) Weight:  [116.2 kg] 116.2 kg (05/23 0540) Last BM Date : 10/04/22  Weight change: Filed Weights   10/03/22 0425 10/04/22 0326 10/05/22 0540  Weight: 121.6 kg 118.6 kg 116.2 kg    Intake/Output:   Intake/Output Summary (Last 24 hours) at 10/05/2022 0752 Last data filed at 10/05/2022 0600 Gross per 24 hour  Intake 646.84 ml  Output 801 ml  Net -154.16 ml     Physical Exam  General:  Well appearing. Lying flat in bed. HEENT:  normal Neck: supple. JVP difficult d/t neck size. Carotids 2+ bilat; no bruits.  Cor: PMI nondisplaced. Irregular rhythm. No rubs, gallops or murmurs. Lungs: clear Abdomen: obese, soft, nontender, nondistended. Extremities: no cyanosis, clubbing, rash, trace edema Neuro: alert & orientedx3. Affect pleasant    Telemetry   AFL 80s  EKG    N./A  Labs    CBC Recent Labs    10/04/22 0405 10/05/22 0530  WBC 6.2 5.2  HGB 10.6* 10.9*  HCT 33.3* 33.9*  MCV 98.5 98.3  PLT 156 155   Basic Metabolic Panel Recent Labs    21/30/86 0405 10/05/22 0530  NA 129* 132*  K 3.8 3.9  CL 88* 88*  CO2 29 31  GLUCOSE 213* 161*  BUN 45* 44*  CREATININE 2.21* 2.38*  CALCIUM 9.2 9.5  MG 2.2 2.1   Liver Function Tests No results for input(s): "AST", "ALT", "ALKPHOS", "BILITOT", "PROT", "ALBUMIN" in the last 72 hours. No results for input(s): "LIPASE", "AMYLASE" in the last 72 hours. Cardiac Enzymes No results for input(s): "CKTOTAL", "CKMB", "CKMBINDEX", "TROPONINI" in the last 72 hours.   BNP: BNP (last 3 results) Recent Labs    09/06/22 1611 09/20/22 1105 09/26/22 1318  BNP 1,807.9* 2,885.7* 2,617.1*    ProBNP (last 3 results) No results for input(s): "PROBNP" in the last 8760 hours.   D-Dimer No results for input(s): "DDIMER" in the last 72 hours.  Hemoglobin A1C No results for input(s): "HGBA1C" in the last 72 hours. Fasting Lipid Panel No results for input(s): "CHOL", "HDL", "LDLCALC", "TRIG", "CHOLHDL", "LDLDIRECT" in the last 72 hours. Thyroid Function Tests No results for input(s): "TSH", "T4TOTAL", "T3FREE", "THYROIDAB" in the last 72 hours.  Invalid input(s): "FREET3"   Other results:   Imaging    No results found.   Medications:     Scheduled Medications:  apixaban  5 mg Oral BID   atorvastatin  40 mg Oral Daily   Chlorhexidine Gluconate Cloth  6 each Topical Daily   dorzolamide-timolol  1 drop Both Eyes BID   famotidine  20 mg Oral Daily    insulin aspart  0-5 Units Subcutaneous QHS   insulin aspart  0-9 Units Subcutaneous TID WC   insulin glargine-yfgn  10 Units Subcutaneous QHS   loratadine  10 mg Oral Daily   polyethylene glycol  17 g Oral Daily   sildenafil  40 mg Oral TID   sodium chloride flush  10-40 mL Intracatheter Q12H   sodium chloride flush  3 mL Intravenous Q12H   sodium chloride flush  3 mL Intravenous Q12H    Infusions:  sodium chloride     amiodarone 30 mg/hr (10/05/22 0445)   furosemide (LASIX) 200 mg in dextrose 5 % 100 mL (2 mg/mL) infusion 15 mg/hr (10/05/22 0438)   milrinone 0.125 mcg/kg/min (10/05/22 0444)    PRN Medications: acetaminophen **OR** acetaminophen, benzonatate, hydrALAZINE, loperamide, ondansetron **OR** ondansetron (ZOFRAN) IV, mouth rinse, sodium chloride flush    Patient Profile     Ms Gratzer is a 67 year old with a history of CAD (NSTEMI 06/2012 w/ PCI to LAD and Lcx) LVEF 55% at that time, OSA, HTN, hyperlipidemia, hx of breast ca s/p lumpectomy and HFpEF.    Admitted with A/C HFpEF--RV Failure and new onset Atrial Flutter.   Assessment/Plan   1. RV failure/pulmonary hypertension:   - RHC (9/23) with RA 30, RV 107/20-30, PA 107/48 (69), unable to wedge; LVEDP 15. AO 140/64. LVEDP ~16. Assuming a PCWP of 20, TPG would be 49 w/ PVR of 7.  Echo 3/24 with RV severely enlarged w/ severely reduced RV function.  Cardiac MRI in 5/24 with EF 75%, moderate RV dilation with RV EF 50%, moderate TR, mid-wall basal septal/basal inferolateral LGE.  Could be seen with cardiac sarcoidosis (vs prior myocarditis).  CT chest showed RUL nodule (negative on PET) and mediastinal LAN.  V/Q scan not suggestive of chronic PEs.  RHC 5/16 showed severe PAH with low CI 1.73, PAPi 1.5, RA pressure 32 (R>>L heart failure). TEE this admission with LV EF 60-65%, D-shaped septum, moderate RV dilation with moderate RV dysfunction, mod-severe TR.  Likely end-stage PH/cor pulmonale d/t OHS/OSA and group 1 PAH. -  CO-OX stable on milrinone 0.125. Stop milrinone. CVP down to 14, diuresed 31 lb. Doubt will tolerate CVP much lower with severe RV failure. Stop lasix gtt. Start Torsemide 40 mg BID (on lasix 60 BID at home). May need intermittent metolazone. - Continue sildenafil 40 TID  - Consider ERA as outpatient - Sent autoimmune serologies => RF elevated, ANA/anti-centromere/SCL-70 negative.  - Needs eventual cardiac PET to assess for cardiac sarcoidosis.   - Moderate OSA on recent sleep study with severe O2 desats (low 70s), now using CPAP at night  2. Syncope:  - Suspect this was due to severe PH/RV failure.  No arrhythmias noted on telemetry.   3. Atrial flutter:  - New diagnosis.  V-rates mildly  elevated low 100s.  TEE-guided DCCV to NSR 05/17 but AFL recurred.  Amiodarone gtt started for rate control and to keep her in NSR after repeat DCCV.   - DCCV this afternoon. Continue amiodarone gtt until after cardioversion. - Continue apixaban.  4. CAD:  - Remote PCI LAD and LCx.  - Continue statin.  - Stopped ASA with anticoagulation.   5. CKD stage 3:  - Creatinine baseline previously ~1.4 - Creatinine up slightly today, 2.21>2.38. Follow closely with diuretics and RV failure.  6. Hypokalemia/hypomagnesemia: - Supp K today - Mag okay  7. Obesity: - BMI 43 - Referred to outpatient PharmD for GLP1RA to assist with weight loss  Maximino Sarin PA-C 10/05/2022 7:52 AM  Patient seen and examined with the above-signed Advanced Practice Provider and/or Housestaff. I personally reviewed laboratory data, imaging studies and relevant notes. I independently examined the patient and formulated the important aspects of the plan. I have edited the note to reflect any of my changes or salient points. I have personally discussed the plan with the patient and/or family.  She has diuresed another 5 pounds. Now 31 pounds today. Co-ox stable on milrinone. CVP 14.   SCr up slightly. Remains in AFL despite IV  amio. On eliquis  Denies CP or SOB.   General:  Sitting up in bed No resp difficulty HEENT: normal Neck: supple.JVP to jaw . Carotids 2+ bilat; no bruits. No lymphadenopathy or thryomegaly appreciated. Cor: . Irregular tachy . 2/6 TR Lungs: clear Abdomen: obese soft, nontender, nondistended. No hepatosplenomegaly. No bruits or masses. Good bowel sounds. Extremities: no cyanosis, clubbing, rash, tr edema Neuro: alert & orientedx3, cranial nerves grossly intact. moves all 4 extremities w/o difficulty. Affect pleasant  Volume status much improved. Agree with stopping IV lasix and milrinone. Plan repeat DC-CV today. Continue CPAP. Supp K.  Arvilla Meres, MD  1:03 PM

## 2022-10-05 NOTE — H&P (View-Only) (Signed)
Patient ID: Sandra Schneider, female   DOB: 03/01/1956, 67 y.o.   MRN: [id number]     Advanced Heart Failure Rounding Note  PCP-Cardiologist: Branch, Jonathan, MD   Subjective:   Admitted with marked volume overload and new atrial flutter. 5/15 had syncopal episode. Heart rate 50. Unresponsive. Had brief CPR.  5/16 RHC markedly elevated RH filling pressures w/ low but not markedly low PAPi, mildly elevated PCWP, severe PAH and low CO w/ CI of 1.73 =>started on milrinone and Lasix gtt  5/17 TEE-guided DCCV to NSR but atrial flutter recurred later that day.  5/21 Milrinone decreased to 0.125. Sildenafil increased to 40 TID  CO-OX 72% on milrinone 0.125  CVP 14. Is/Os incomplete. Weight down another 5 lb, 31 lb total.  Scr 2.21.2.38  Dyspnea and leg edema improved. Tired of getting up frequently to void.  RHC 5/16 Hemodynamics (mmHg) RA 32 RV 95/32 PA 93/44, mean 66 PCWP mean 19 Oxygen saturations: PA 49% AO 96% Cardiac Output (Fick) 3.98  Cardiac Index (Fick) 1.73 PVR 11.8 PAPi 1.5   VQ -low probability for PE.   TEE - LV EF 60-65%, D-shaped septum, moderate RV dilation with moderate RV dysfunction, mod-severe TR.    Objective:   Weight Range: 116.2 kg Body mass index is 42.63 kg/m.   Vital Signs:   Temp:  [97.6 F (36.4 C)-98.3 F (36.8 C)] 97.9 F (36.6 C) (05/23 0721) Pulse Rate:  [78-96] 80 (05/23 0721) Resp:  [14-20] 20 (05/23 0721) BP: (117-153)/(43-93) 117/58 (05/23 0721) SpO2:  [91 %-98 %] 98 % (05/23 0416) Weight:  [116.2 kg] 116.2 kg (05/23 0540) Last BM Date : 10/04/22  Weight change: Filed Weights   10/03/22 0425 10/04/22 0326 10/05/22 0540  Weight: 121.6 kg 118.6 kg 116.2 kg    Intake/Output:   Intake/Output Summary (Last 24 hours) at 10/05/2022 0752 Last data filed at 10/05/2022 0600 Gross per 24 hour  Intake 646.84 ml  Output 801 ml  Net -154.16 ml     Physical Exam  General:  Well appearing. Lying flat in bed. HEENT:  normal Neck: supple. JVP difficult d/t neck size. Carotids 2+ bilat; no bruits.  Cor: PMI nondisplaced. Irregular rhythm. No rubs, gallops or murmurs. Lungs: clear Abdomen: obese, soft, nontender, nondistended. Extremities: no cyanosis, clubbing, rash, trace edema Neuro: alert & orientedx3. Affect pleasant    Telemetry   AFL 80s  EKG    N./A  Labs    CBC Recent Labs    10/04/22 0405 10/05/22 0530  WBC 6.2 5.2  HGB 10.6* 10.9*  HCT 33.3* 33.9*  MCV 98.5 98.3  PLT 156 155   Basic Metabolic Panel Recent Labs    10/04/22 0405 10/05/22 0530  NA 129* 132*  K 3.8 3.9  CL 88* 88*  CO2 29 31  GLUCOSE 213* 161*  BUN 45* 44*  CREATININE 2.21* 2.38*  CALCIUM 9.2 9.5  MG 2.2 2.1   Liver Function Tests No results for input(s): "AST", "ALT", "ALKPHOS", "BILITOT", "PROT", "ALBUMIN" in the last 72 hours. No results for input(s): "LIPASE", "AMYLASE" in the last 72 hours. Cardiac Enzymes No results for input(s): "CKTOTAL", "CKMB", "CKMBINDEX", "TROPONINI" in the last 72 hours.   BNP: BNP (last 3 results) Recent Labs    09/06/22 1611 09/20/22 1105 09/26/22 1318  BNP 1,807.9* 2,885.7* 2,617.1*    ProBNP (last 3 results) No results for input(s): "PROBNP" in the last 8760 hours.   D-Dimer No results for input(s): "DDIMER" in the last 72 hours.   Hemoglobin A1C No results for input(s): "HGBA1C" in the last 72 hours. Fasting Lipid Panel No results for input(s): "CHOL", "HDL", "LDLCALC", "TRIG", "CHOLHDL", "LDLDIRECT" in the last 72 hours. Thyroid Function Tests No results for input(s): "TSH", "T4TOTAL", "T3FREE", "THYROIDAB" in the last 72 hours.  Invalid input(s): "FREET3"   Other results:   Imaging    No results found.   Medications:     Scheduled Medications:  apixaban  5 mg Oral BID   atorvastatin  40 mg Oral Daily   Chlorhexidine Gluconate Cloth  6 each Topical Daily   dorzolamide-timolol  1 drop Both Eyes BID   famotidine  20 mg Oral Daily    insulin aspart  0-5 Units Subcutaneous QHS   insulin aspart  0-9 Units Subcutaneous TID WC   insulin glargine-yfgn  10 Units Subcutaneous QHS   loratadine  10 mg Oral Daily   polyethylene glycol  17 g Oral Daily   sildenafil  40 mg Oral TID   sodium chloride flush  10-40 mL Intracatheter Q12H   sodium chloride flush  3 mL Intravenous Q12H   sodium chloride flush  3 mL Intravenous Q12H    Infusions:  sodium chloride     amiodarone 30 mg/hr (10/05/22 0445)   furosemide (LASIX) 200 mg in dextrose 5 % 100 mL (2 mg/mL) infusion 15 mg/hr (10/05/22 0438)   milrinone 0.125 mcg/kg/min (10/05/22 0444)    PRN Medications: acetaminophen **OR** acetaminophen, benzonatate, hydrALAZINE, loperamide, ondansetron **OR** ondansetron (ZOFRAN) IV, mouth rinse, sodium chloride flush    Patient Profile     Sandra Schneider is a 67 year old with a history of CAD (NSTEMI 06/2012 w/ PCI to LAD and Lcx) LVEF 55% at that time, OSA, HTN, hyperlipidemia, hx of breast ca s/p lumpectomy and HFpEF.    Admitted with A/C HFpEF--RV Failure and new onset Atrial Flutter.   Assessment/Plan   1. RV failure/pulmonary hypertension:   - RHC (9/23) with RA 30, RV 107/20-30, PA 107/48 (69), unable to wedge; LVEDP 15. AO 140/64. LVEDP ~16. Assuming a PCWP of 20, TPG would be 49 w/ PVR of 7.  Echo 3/24 with RV severely enlarged w/ severely reduced RV function.  Cardiac MRI in 5/24 with EF 75%, moderate RV dilation with RV EF 50%, moderate TR, mid-wall basal septal/basal inferolateral LGE.  Could be seen with cardiac sarcoidosis (vs prior myocarditis).  CT chest showed RUL nodule (negative on PET) and mediastinal LAN.  V/Q scan not suggestive of chronic PEs.  RHC 5/16 showed severe PAH with low CI 1.73, PAPi 1.5, RA pressure 32 (R>>L heart failure). TEE this admission with LV EF 60-65%, D-shaped septum, moderate RV dilation with moderate RV dysfunction, mod-severe TR.  Likely end-stage PH/cor pulmonale d/t OHS/OSA and group 1 PAH. -  CO-OX stable on milrinone 0.125. Stop milrinone. CVP down to 14, diuresed 31 lb. Doubt will tolerate CVP much lower with severe RV failure. Stop lasix gtt. Start Torsemide 40 mg BID (on lasix 60 BID at home). May need intermittent metolazone. - Continue sildenafil 40 TID  - Consider ERA as outpatient - Sent autoimmune serologies => RF elevated, ANA/anti-centromere/SCL-70 negative.  - Needs eventual cardiac PET to assess for cardiac sarcoidosis.   - Moderate OSA on recent sleep study with severe O2 desats (low 70s), now using CPAP at night  2. Syncope:  - Suspect this was due to severe PH/RV failure.  No arrhythmias noted on telemetry.   3. Atrial flutter:  - New diagnosis.  V-rates mildly   elevated low 100s.  TEE-guided DCCV to NSR 05/17 but AFL recurred.  Amiodarone gtt started for rate control and to keep her in NSR after repeat DCCV.   - DCCV this afternoon. Continue amiodarone gtt until after cardioversion. - Continue apixaban.  4. CAD:  - Remote PCI LAD and LCx.  - Continue statin.  - Stopped ASA with anticoagulation.   5. CKD stage 3:  - Creatinine baseline previously ~1.4 - Creatinine up slightly today, 2.21>2.38. Follow closely with diuretics and RV failure.  6. Hypokalemia/hypomagnesemia: - Supp K today - Mag okay  7. Obesity: - BMI 43 - Referred to outpatient PharmD for GLP1RA to assist with weight loss  FINCH, LINDSAY N PA-C 10/05/2022 7:52 AM  Patient seen and examined with the above-signed Advanced Practice Provider and/or Housestaff. I personally reviewed laboratory data, imaging studies and relevant notes. I independently examined the patient and formulated the important aspects of the plan. I have edited the note to reflect any of my changes or salient points. I have personally discussed the plan with the patient and/or family.  She has diuresed another 5 pounds. Now 31 pounds today. Co-ox stable on milrinone. CVP 14.   SCr up slightly. Remains in AFL despite IV  amio. On eliquis  Denies CP or SOB.   General:  Sitting up in bed No resp difficulty HEENT: normal Neck: supple.JVP to jaw . Carotids 2+ bilat; no bruits. No lymphadenopathy or thryomegaly appreciated. Cor: . Irregular tachy . 2/6 TR Lungs: clear Abdomen: obese soft, nontender, nondistended. No hepatosplenomegaly. No bruits or masses. Good bowel sounds. Extremities: no cyanosis, clubbing, rash, tr edema Neuro: alert & orientedx3, cranial nerves grossly intact. moves all 4 extremities w/o difficulty. Affect pleasant  Volume status much improved. Agree with stopping IV lasix and milrinone. Plan repeat DC-CV today. Continue CPAP. Supp K.  Chanin Frumkin, MD  1:03 PM        

## 2022-10-05 NOTE — Progress Notes (Addendum)
Progress Note   Patient: Sandra Schneider ZOX:[id number] DOB: 19-Nov-1955 DOA: 09/26/2022     9 DOS: the patient was seen and examined on 10/05/2022   Brief hospital course: Sandra Schneider was admitted to the hospital with the working diagnosis of heart failure exacerbation.   67 yo female with the past medical history of heart failure, CKD stage 3b, coronary artery disease, breast cancer, T2DM, and hypertension who presented with dyspnea. Patient had several weeks or worsening dyspnea and lower extremity edema. Out patient follow up with cardiology on the day of admission she was found volume overloaded and recommended ER evaluation. On her initial physical examination her blood pressure was 120/80, HR 80, RR 23 and 02 saturation 95%, lungs with no wheezing or rales, heart with S1 and S2 present, irregularly irregular with no gallops, rubs or murmurs, abdomen with no distention, and bilateral lower extremity edema.   Na 137, K 4,5 Cl 98, bicarbonate 26, glucose 153, bun 59, cr 2.2  BNP 2,671  High sensitive troponin 48 and 60  Wbc 7,0 hgb 12,5 plt 250   Chest radiograph with cardiomegaly, bilateral hilar vascular congestion, bilateral interstitial infiltrates. Right upper lobe nodule.   EKG 80 bpm, normal axis, normal intervals, atrial flutter (typical) 3:1 conduction, with no significant ST segment changes, negative T wave lead II, III, AvF, V4 to V6.   05/14 bradycardic episode, 50 bpm, with decreased responsiveness/ syncope. Improved with chest compression. (No cardiac arrest).  05/16 right heart catheterization with severe pulmonary hypertension.  05/17 TEE cardioversion.  05/18 patient back in atrial flutter, she has been placed on milrinone for RV failure with low output.  05/19 continue atrial flutter.  05/20 atrial fibrillation on telemetry monitor.  05/21 continue diuresing, will likely need repeat direct current cardioversion when more euvolemic.  05/22 continue diuresis with  furosemide and inotropic support with milrinone. She fell out of the chair yesterday with no head trauma. No syncope.  05/23 milrinone infusion has been discontinued today.   Assessment and Plan: * Acute on chronic diastolic CHF (congestive heart failure) (HCC) Echocardiogram with preserved LV systolic function EF >75%, interventricular septum is flattened in systole and diastole. RV with severe enlargement, RV systolic function with moderate reduction, RVSP 45.9 mmHg. TR mild to moderate.   Acute on chronic core pulmonale.  Pulmonary hypertension (probably type 5 or type 1).  RV failure   V/Q scan with low probability for pulmonary embolism.   05/16 cardiac catheterization  PA 93/44 mean 66 PCWP mean 19 Cardiac output 3,9 and index 1,73 PVR 11,8   Mainly precapillary pulmonary hypertension, with mild left heart failure.   SV02 72 Urine output 1,100  ml Systolic blood pressure 139 to 117 mmHg.   Sequential nephron blockade with:  Sp Acetazolamide.  Furosemide drip 15 mg per hr discontinued.  Metolazone on 05/16, 05/17,05/18, 05/20, 05/21/ 05/22.    Sildenafil for pulmonary hypertension.   Persistent atrial fibrillation (HCC) Rate controlled atrial flutter -fibrillation.  Anticoagulation with heparin drip 05/18 failed direct current cardioversion. (TEE).   Plan for direct current cardioversion today. Continue amiodarone drip, possible transition to PO after cardioversion.  Continue telemetry monitoring.   Stage 4 chronic kidney disease (HCC) AKI, hyponatremia.   Renal function has been stable with serum cr today at 2,38 with K at 3,9 and serum bicarbonate at 31.  Na 132. Mg 2.1   Continue close renal function monitoring.   Essential (primary) hypertension Today off milrinone.   Type 2 diabetes mellitus  with hyperglycemia (HCC) Uncontrolled T2Dm with hyperglycemia.  Fasting glucose today is 161 mg/dl. Continue glucose cover and monitoring with insulin sliding  scale.  Basal insulin to 10 units.    Malignant neoplasm of upper-outer quadrant of right breast in female, estrogen receptor positive (HCC) Follow up as outpatient.   Class 3 obesity (HCC) Calculated BMI is 47,3   Restless legs syndrome, had ropinarole.   Solitary pulmonary nodule on lung CT Follow up as outpatient.         Subjective: patient is feeling better, dyspnea has improved, no PND or orthopnea, he has been out of bed   Physical Exam: Vitals:   10/04/22 2319 10/05/22 0416 10/05/22 0540 10/05/22 0721  BP: 121/72 139/75  (!) 117/58  Pulse:    80  Resp: 15 14 17 20   Temp: 98.3 F (36.8 C) 97.8 F (36.6 C)  97.9 F (36.6 C)  TempSrc: Axillary Axillary  Oral  SpO2: 98% 98%    Weight:   116.2 kg   Height:       Neurology awake and alert ENT with mild pallor Cardiovascular with S1 and S2 present, irregularly irregular, positive systolic murmur at the right lower sternal border No JVD Trace lower extremity edema Respiratory with no rales or wheezing, no rhonchi Abdomen with no distention  Data Reviewed:    Family Communication: no family at the bedside   Disposition: Status is: Inpatient Remains inpatient appropriate because: heart failure, for direct current cardioversion (second attempt).   Planned Discharge Destination: Home      Author: Coralie Keens, MD 10/05/2022 9:41 AM  For on call review [email or web address].

## 2022-10-05 NOTE — Discharge Instructions (Signed)

## 2022-10-05 NOTE — Progress Notes (Signed)
Physical Therapy Treatment Patient Details Name: Sandra Schneider MRN: [id number] DOB: [birth year] Today's Date: 10/05/2022   History of Present Illness 67 y/o female adm 09/26/22 with dyspnea, volume overload and new Aflutter. 5/15 syncope in ED with brief CPR. 5/17 TEE with DCCV with return of Aflutter same day. PMhx: CHF, CKD, CAD, breast CA, T2DM, HTN    PT Comments    Pt pleasant and able to increased gait distance with decreased standing rest breaks this session. Pt continues to benefit from rollator for improved activity and with current functional mobility OPPT is appropriate with cues for breathing technique, rollator use, energy conservation and CHF education. Encouraged walking program and OOB to chair but pt denied recliner and returned to bed prior to procedure.   HR 82-92 SpO2 drop to 87% on RA at rest, on 2L able to maintain 92% with gait    Recommendations for follow up therapy are one component of a multi-disciplinary discharge planning process, led by the attending physician.  Recommendations may be updated based on patient status, additional functional criteria and insurance authorization.  Follow Up Recommendations       Assistance Recommended at Discharge PRN  Patient can return home with the following A little help with bathing/dressing/bathroom;Assistance with cooking/housework;Help with stairs or ramp for entrance   Equipment Recommendations  Rollator (4 wheels)    Recommendations for Other Services       Precautions / Restrictions Precautions Precautions: Fall;Other (comment) Precaution Comments: watch SPO2     Mobility  Bed Mobility Overal bed mobility: Modified Independent             General bed mobility comments: supine to sit with bed flat, pt able to scoot to [facility] with rails    Transfers Overall transfer level: Modified independent                 General transfer comment: pt on BSC on arrival, stand pivot to EOB without  assist, rise from bed    Ambulation/Gait Ambulation/Gait assistance: Supervision Gait Distance (Feet): 550 Feet Assistive device: Rollator (4 wheels) Gait Pattern/deviations: Step-through pattern, Decreased stride length   Gait velocity interpretation: 1.31 - 2.62 ft/sec, indicative of limited community ambulator   General Gait Details: cues for proximity to RW, 2 brief standing rest breaks with pt on 2L throughout 92-94%   Stairs             Wheelchair Mobility    Modified Rankin (Stroke Patients Only)       Balance Overall balance assessment: Mild deficits observed, not formally tested                                          Cognition Arousal/Alertness: Awake/alert Behavior During Therapy: WFL for tasks assessed/performed Overall Cognitive Status: Within Functional Limits for tasks assessed                                          Exercises      General Comments        Pertinent Vitals/Pain Pain Assessment Pain Assessment: 0-10 Pain Score: 4  Pain Location: dorsal portion of left foot Pain Descriptors / Indicators: Aching, Sore Pain Intervention(s): Limited activity within patient's tolerance, Monitored during session, Repositioned    Home Living  Prior Function            PT Goals (current goals can now be found in the care plan section) Progress towards PT goals: Progressing toward goals    Frequency    Min 1X/week      PT Plan Discharge plan needs to be updated    Co-evaluation              AM-PAC PT "6 Clicks" Mobility   Outcome Measure  Help needed turning from your back to your side while in a flat bed without using bedrails?: None Help needed moving from lying on your back to sitting on the side of a flat bed without using bedrails?: None Help needed moving to and from a bed to a chair (including a wheelchair)?: None Help needed standing up from a  chair using your arms (e.g., wheelchair or bedside chair)?: None Help needed to walk in hospital room?: A Little Help needed climbing 3-5 steps with a railing? : A Little 6 Click Score: 22    End of Session Equipment Utilized During Treatment: Oxygen Activity Tolerance: Patient tolerated treatment well Patient left: in bed;with call bell/phone within reach Nurse Communication: Mobility status PT Visit Diagnosis: Other abnormalities of gait and mobility (R26.89);Muscle weakness (generalized) (M62.81)     Time: 1308-6578 PT Time Calculation (min) (ACUTE ONLY): 36 min  Charges:  $Gait Training: 23-37 mins                     Merryl Hacker, PT Acute Rehabilitation Services Office: [phone]    Enedina Finner Tamari Redwine 10/05/2022, 1:17 PM

## 2022-10-05 NOTE — CV Procedure (Signed)
    DIRECT CURRENT CARDIOVERSION  NAME:  Sandra Schneider   MRN: [id number] DOB:  15-Oct-1955   ADMIT DATE: 09/26/2022   INDICATIONS: Atrial flutter   PROCEDURE:   Informed consent was obtained prior to the procedure. The risks, benefits and alternatives for the procedure were discussed and the patient comprehended these risks. Once an appropriate time out was taken, the patient had the defibrillator pads placed in the anterior and posterior position. The patient then underwent sedation by the anesthesia service. Once an appropriate level of sedation was achieved, the patient received a single biphasic, synchronized 200J shock with prompt conversion to sinus rhythm. No apparent complications.  Arvilla Meres, MD  1:25 PM

## 2022-10-05 NOTE — Anesthesia Postprocedure Evaluation (Signed)
Anesthesia Post Note  Patient: Roylene Mahi Viglione  Procedure(s) Performed: CARDIOVERSION     Patient location during evaluation: PACU Anesthesia Type: General Level of consciousness: awake and alert and oriented Pain management: pain level controlled Vital Signs Assessment: post-procedure vital signs reviewed and stable Respiratory status: spontaneous breathing, nonlabored ventilation, respiratory function stable and patient connected to nasal cannula oxygen Cardiovascular status: blood pressure returned to baseline and stable Postop Assessment: no apparent nausea or vomiting Anesthetic complications: no   No notable events documented.  Last Vitals:  Vitals:   10/05/22 1340 10/05/22 1350  BP: (!) 146/72 131/64  Pulse: 75 74  Resp: 13 17  Temp:    SpO2: 90% 91%    Last Pain:  Vitals:   10/05/22 1350  TempSrc:   PainSc: 0-No pain                 Shalynn Jorstad A.

## 2022-10-05 NOTE — Plan of Care (Signed)
  Problem: Education: Goal: Ability to demonstrate management of disease process will improve Outcome: Progressing Goal: Ability to verbalize understanding of medication therapies will improve Outcome: Progressing Goal: Individualized Educational Video(s) Outcome: Progressing   Problem: Activity: Goal: Capacity to carry out activities will improve Outcome: Progressing   Problem: Cardiac: Goal: Ability to achieve and maintain adequate cardiopulmonary perfusion will improve Outcome: Progressing   Problem: Education: Goal: Knowledge of disease or condition will improve Outcome: Progressing Goal: Understanding of medication regimen will improve Outcome: Progressing Goal: Individualized Educational Video(s) Outcome: Progressing   Problem: Activity: Goal: Ability to tolerate increased activity will improve Outcome: Progressing   Problem: Cardiac: Goal: Ability to achieve and maintain adequate cardiopulmonary perfusion will improve Outcome: Progressing   Problem: Health Behavior/Discharge Planning: Goal: Ability to safely manage health-related needs after discharge will improve Outcome: Progressing   Problem: Education: Goal: Knowledge of General Education information will improve Description: Including pain rating scale, medication(s)/side effects and non-pharmacologic comfort measures Outcome: Progressing   Problem: Health Behavior/Discharge Planning: Goal: Ability to manage health-related needs will improve Outcome: Progressing   Problem: Clinical Measurements: Goal: Ability to maintain clinical measurements within normal limits will improve Outcome: Progressing Goal: Diagnostic test results will improve Outcome: Progressing Goal: Respiratory complications will improve Outcome: Progressing Goal: Cardiovascular complication will be avoided Outcome: Progressing   Problem: Activity: Goal: Risk for activity intolerance will decrease Outcome: Progressing   Problem:  Nutrition: Goal: Adequate nutrition will be maintained Outcome: Progressing   Problem: Coping: Goal: Level of anxiety will decrease Outcome: Progressing   Problem: Elimination: Goal: Will not experience complications related to bowel motility Outcome: Progressing Goal: Will not experience complications related to urinary retention Outcome: Progressing   Problem: Pain Managment: Goal: General experience of comfort will improve Outcome: Progressing   Problem: Safety: Goal: Ability to remain free from injury will improve Outcome: Progressing   Problem: Skin Integrity: Goal: Risk for impaired skin integrity will decrease Outcome: Progressing   Problem: Education: Goal: Knowledge of General Education information will improve Description: Including pain rating scale, medication(s)/side effects and non-pharmacologic comfort measures Outcome: Progressing   Problem: Health Behavior/Discharge Planning: Goal: Ability to manage health-related needs will improve Outcome: Progressing   Problem: Clinical Measurements: Goal: Ability to maintain clinical measurements within normal limits will improve Outcome: Progressing Goal: Will remain free from infection Outcome: Progressing Goal: Diagnostic test results will improve Outcome: Progressing Goal: Respiratory complications will improve Outcome: Progressing Goal: Cardiovascular complication will be avoided Outcome: Progressing   Problem: Activity: Goal: Risk for activity intolerance will decrease Outcome: Progressing   Problem: Nutrition: Goal: Adequate nutrition will be maintained Outcome: Progressing   Problem: Coping: Goal: Level of anxiety will decrease Outcome: Progressing   Problem: Elimination: Goal: Will not experience complications related to bowel motility Outcome: Progressing Goal: Will not experience complications related to urinary retention Outcome: Progressing   Problem: Pain Managment: Goal: General  experience of comfort will improve Outcome: Progressing   Problem: Safety: Goal: Ability to remain free from injury will improve Outcome: Progressing   Problem: Skin Integrity: Goal: Risk for impaired skin integrity will decrease Outcome: Progressing   Problem: Education: Goal: Understanding of CV disease, CV risk reduction, and recovery process will improve Outcome: Progressing Goal: Individualized Educational Video(s) Outcome: Progressing

## 2022-10-06 ENCOUNTER — Other Ambulatory Visit (HOSPITAL_COMMUNITY): Payer: Self-pay

## 2022-10-06 ENCOUNTER — Telehealth (HOSPITAL_COMMUNITY): Payer: Self-pay | Admitting: Pharmacy Technician

## 2022-10-06 ENCOUNTER — Encounter (HOSPITAL_COMMUNITY): Payer: Self-pay | Admitting: Internal Medicine

## 2022-10-06 DIAGNOSIS — N184 Chronic kidney disease, stage 4 (severe): Secondary | ICD-10-CM | POA: Diagnosis not present

## 2022-10-06 DIAGNOSIS — I10 Essential (primary) hypertension: Secondary | ICD-10-CM | POA: Diagnosis not present

## 2022-10-06 DIAGNOSIS — I4819 Other persistent atrial fibrillation: Secondary | ICD-10-CM | POA: Diagnosis not present

## 2022-10-06 DIAGNOSIS — I5033 Acute on chronic diastolic (congestive) heart failure: Secondary | ICD-10-CM | POA: Diagnosis not present

## 2022-10-06 LAB — BASIC METABOLIC PANEL
Anion gap: 12 (ref 5–15)
BUN: 43 mg/dL — ABNORMAL HIGH (ref 8–23)
CO2: 31 mmol/L (ref 22–32)
Calcium: 9.6 mg/dL (ref 8.9–10.3)
Chloride: 88 mmol/L — ABNORMAL LOW (ref 98–111)
Creatinine, Ser: 2.57 mg/dL — ABNORMAL HIGH (ref 0.44–1.00)
GFR, Estimated: 20 mL/min — ABNORMAL LOW (ref >60)
Glucose, Bld: 143 mg/dL — ABNORMAL HIGH (ref 70–99)
Potassium: 3.9 mmol/L (ref 3.5–5.1)
Sodium: 131 mmol/L — ABNORMAL LOW (ref 135–145)

## 2022-10-06 LAB — COOXEMETRY PANEL
Carboxyhemoglobin: 1.4 % (ref 0.5–1.5)
Methemoglobin: <0.7 % (ref 0.0–1.5)
O2 Saturation: 65.2 %
Total hemoglobin: 11.6 g/dL — ABNORMAL LOW (ref 12.0–16.0)

## 2022-10-06 LAB — CBC
HCT: 35.5 % — ABNORMAL LOW (ref 36.0–46.0)
Hemoglobin: 11.2 g/dL — ABNORMAL LOW (ref 12.0–15.0)
MCH: 30.9 pg (ref 26.0–34.0)
MCHC: 31.5 g/dL (ref 30.0–36.0)
MCV: 97.8 fL (ref 80.0–100.0)
Platelets: 154 10*3/uL (ref 150–400)
RBC: 3.63 MIL/uL — ABNORMAL LOW (ref 3.87–5.11)
RDW: 14.8 % (ref 11.5–15.5)
WBC: 5.1 10*3/uL (ref 4.0–10.5)
nRBC: 0 % (ref 0.0–0.2)

## 2022-10-06 LAB — GLUCOSE, CAPILLARY
Glucose-Capillary: 133 mg/dL — ABNORMAL HIGH (ref 70–99)
Glucose-Capillary: 167 mg/dL — ABNORMAL HIGH (ref 70–99)
Glucose-Capillary: 206 mg/dL — ABNORMAL HIGH (ref 70–99)
Glucose-Capillary: 189 mg/dL — ABNORMAL HIGH (ref 70–99)

## 2022-10-06 LAB — MAGNESIUM: Magnesium: 2.1 mg/dL (ref 1.7–2.4)

## 2022-10-06 MED ORDER — AMIODARONE HCL 200 MG PO TABS
200.0000 mg | ORAL_TABLET | Freq: Two times a day (BID) | ORAL | Status: DC
  Administered 2022-10-06 – 2022-10-07 (×3): 200 mg via ORAL
  Filled 2022-10-06 (×3): qty 1

## 2022-10-06 NOTE — Progress Notes (Signed)
Mobility Specialist Progress Note:   10/06/22 1019  Mobility  Activity Ambulated with assistance in hallway  Level of Assistance Contact guard assist, steadying assist  Assistive Device Four wheel walker  Distance Ambulated (ft) 370 ft  Activity Response Tolerated well  Mobility Referral Yes  $Mobility charge 1 Mobility  Mobility Specialist Start Time (ACUTE ONLY) [id number]  Mobility Specialist Stop Time (ACUTE ONLY) 1018  Mobility Specialist Time Calculation (min) (ACUTE ONLY) 41 min   Pt received in bed, agreeable to ambulate. Pt attempted to ambulate on RA, pt desat on RA so increased flow to 2L/min  with sats maintaining >90% SPO2 throughout. Pt took one standing break d/t fatigue, c/o pain in L foot no rating given. Pt left sitting EOB w/ call light at side.    Pre Mobility on RA Hr 64 SPO2 88% During Mobility     On RA SPO2 82%     On 2L/min SPO2 90% Post Mobility  Hr 69 SPO2 94%  Thompson Grayer Mobility Specialist  Please contact vis Secure Chat or  Rehab Office [phone]

## 2022-10-06 NOTE — TOC Progression Note (Addendum)
Transition of Care [facility]) - Progression Note    Patient Details  Name: Sandra Schneider MRN: [id number] Date of Birth: 11-27-1955  Transition of Care [facility]) CM/SW Contact  Elliot Cousin, RN Phone Number: [phone] 10/06/2022, 4:31 PM  Clinical Narrative:  [facility] Pulm and her CPAP was sent to [facility]. Contacted [facility] for follow up on CPAP.  Per documentation, pt will need oxygen for home. Contacted [employer] rep, Jermaine and he will be following to see when pt dc home. Will deliver portable to room and set up home concentrator.     Expected Discharge Plan: Home w Home Health Services Barriers to Discharge: Continued Medical Work up  Expected Discharge Plan and Services In-house Referral: NA Discharge Planning Services: CM Consult   Living arrangements for the past 2 months: Single Family Home                 DME Arranged: 3-N-1 DME Agency: [employer] Date DME Agency Contacted: 10/04/22 Time DME Agency Contacted: [phone] Representative spoke with at DME Agency: Vaughan Basta HH Arranged: RN HH Agency: [facility] Date [facility] Agency Contacted: 10/04/22 Time HH Agency Contacted: [phone] Representative spoke with at [facility] Agency: Tresa Endo   Social Determinants of Health (SDOH) Interventions SDOH Screenings   Food Insecurity: No Food Insecurity (09/27/2022)  Housing: Low Risk  (09/27/2022)  Transportation Needs: No Transportation Needs (09/27/2022)  Utilities: Not At Risk (09/27/2022)  Tobacco Use: Low Risk  (10/06/2022)    Readmission Risk Interventions     No data to display

## 2022-10-06 NOTE — Plan of Care (Signed)
Problem: Education: Goal: Ability to demonstrate management of disease process will improve Outcome: Progressing Goal: Ability to verbalize understanding of medication therapies will improve Outcome: Progressing Goal: Individualized Educational Video(s) Outcome: Progressing   Problem: Activity: Goal: Capacity to carry out activities will improve Outcome: Progressing   Problem: Cardiac: Goal: Ability to achieve and maintain adequate cardiopulmonary perfusion will improve Outcome: Progressing   Problem: Education: Goal: Knowledge of disease or condition will improve Outcome: Progressing Goal: Understanding of medication regimen will improve Outcome: Progressing Goal: Individualized Educational Video(s) Outcome: Progressing   Problem: Activity: Goal: Ability to tolerate increased activity will improve Outcome: Progressing   Problem: Cardiac: Goal: Ability to achieve and maintain adequate cardiopulmonary perfusion will improve Outcome: Progressing   Problem: Health Behavior/Discharge Planning: Goal: Ability to safely manage health-related needs after discharge will improve Outcome: Progressing   Problem: Education: Goal: Knowledge of General Education information will improve Description: Including pain rating scale, medication(s)/side effects and non-pharmacologic comfort measures Outcome: Progressing   Problem: Health Behavior/Discharge Planning: Goal: Ability to manage health-related needs will improve Outcome: Progressing   Problem: Clinical Measurements: Goal: Ability to maintain clinical measurements within normal limits will improve Outcome: Progressing Goal: Diagnostic test results will improve Outcome: Progressing Goal: Respiratory complications will improve Outcome: Progressing Goal: Cardiovascular complication will be avoided Outcome: Progressing   Problem: Activity: Goal: Risk for activity intolerance will decrease Outcome: Progressing   Problem:  Nutrition: Goal: Adequate nutrition will be maintained Outcome: Progressing   Problem: Coping: Goal: Level of anxiety will decrease Outcome: Progressing   Problem: Elimination: Goal: Will not experience complications related to bowel motility Outcome: Progressing Goal: Will not experience complications related to urinary retention Outcome: Progressing   Problem: Pain Managment: Goal: General experience of comfort will improve Outcome: Progressing   Problem: Safety: Goal: Ability to remain free from injury will improve Outcome: Progressing   Problem: Skin Integrity: Goal: Risk for impaired skin integrity will decrease Outcome: Progressing   Problem: Education: Goal: Knowledge of General Education information will improve Description: Including pain rating scale, medication(s)/side effects and non-pharmacologic comfort measures Outcome: Progressing   Problem: Health Behavior/Discharge Planning: Goal: Ability to manage health-related needs will improve Outcome: Progressing   Problem: Clinical Measurements: Goal: Ability to maintain clinical measurements within normal limits will improve Outcome: Progressing Goal: Will remain free from infection Outcome: Progressing Goal: Diagnostic test results will improve Outcome: Progressing Goal: Respiratory complications will improve Outcome: Progressing Goal: Cardiovascular complication will be avoided Outcome: Progressing   Problem: Activity: Goal: Risk for activity intolerance will decrease Outcome: Progressing   Problem: Nutrition: Goal: Adequate nutrition will be maintained Outcome: Progressing   Problem: Coping: Goal: Level of anxiety will decrease Outcome: Progressing   Problem: Elimination: Goal: Will not experience complications related to bowel motility Outcome: Progressing Goal: Will not experience complications related to urinary retention Outcome: Progressing   Problem: Pain Managment: Goal: General  experience of comfort will improve Outcome: Progressing   Problem: Safety: Goal: Ability to remain free from injury will improve Outcome: Progressing   Problem: Skin Integrity: Goal: Risk for impaired skin integrity will decrease Outcome: Progressing   Problem: Education: Goal: Understanding of CV disease, CV risk reduction, and recovery process will improve Outcome: Progressing Goal: Individualized Educational Video(s) Outcome: Progressing   Problem: Activity: Goal: Ability to return to baseline activity level will improve Outcome: Progressing   Problem: Cardiovascular: Goal: Ability to achieve and maintain adequate cardiovascular perfusion will improve Outcome: Progressing Goal: Vascular access site(s) Level 0-1 will be maintained Outcome: Progressing

## 2022-10-06 NOTE — Progress Notes (Signed)
Nurse requested Mobility Specialist to perform oxygen saturation test with pt which includes removing pt from oxygen both at rest and while ambulating.  Below are the results from that testing.     Patient Saturations on Room Air at Rest = spO2 88%  Patient Saturations on Room Air while Ambulating = sp02 82% .  Rested and performed pursed lip breathing for 1 minute with sp02 at 82%.  Patient Saturations on 2 Liters of oxygen while Ambulating = sp02 90%  At end of testing pt left in room on 2  Liters of oxygen.  Reported results to nurse.

## 2022-10-06 NOTE — TOC CM/SW Note (Signed)
Patient Saturations on Room Air at Rest = spO2 88%   Patient Saturations on Room Air while Ambulating = sp02 82% .  Rested and performed pursed lip breathing for 1 minute with sp02 at 82%.   Patient Saturations on 2 Liters of oxygen while Ambulating = sp02 90%   At end of testing pt left in room on 2  Liters of oxygen.  Obtained during her walk, and documented in chart. Will have attending review and cosign.

## 2022-10-06 NOTE — Progress Notes (Signed)
Progress Note   Patient: Sandra Schneider ZOX:[id number] DOB: 03-28-1956 DOA: 09/26/2022     10 DOS: the patient was seen and examined on 10/06/2022   Brief hospital course: [name] was admitted to the hospital with the working diagnosis of heart failure exacerbation.   67 yo female with the past medical history of heart failure, CKD stage 3b, coronary artery disease, breast cancer, T2DM, and hypertension who presented with dyspnea. Patient had several weeks or worsening dyspnea and lower extremity edema. Out patient follow up with cardiology on the day of admission she was found volume overloaded and recommended ER evaluation. On her initial physical examination her blood pressure was 120/80, HR 80, RR 23 and 02 saturation 95%, lungs with no wheezing or rales, heart with S1 and S2 present, irregularly irregular with no gallops, rubs or murmurs, abdomen with no distention, and bilateral lower extremity edema.   Na 137, K 4,5 Cl 98, bicarbonate 26, glucose 153, bun 59, cr 2.2  BNP 2,671  High sensitive troponin 48 and 60  Wbc 7,0 hgb 12,5 plt 250   Chest radiograph with cardiomegaly, bilateral hilar vascular congestion, bilateral interstitial infiltrates. Right upper lobe nodule.   EKG 80 bpm, normal axis, normal intervals, atrial flutter (typical) 3:1 conduction, with no significant ST segment changes, negative T wave lead II, III, AvF, V4 to V6.   05/14 bradycardic episode, 50 bpm, with decreased responsiveness/ syncope. Improved with chest compression. (No cardiac arrest).  05/16 right heart catheterization with severe pulmonary hypertension.  05/17 TEE cardioversion.  05/18 patient back in atrial flutter, she has been placed on milrinone for RV failure with low output.  05/19 continue atrial flutter.  05/20 atrial fibrillation on telemetry monitor.  05/21 continue diuresing, will likely need repeat direct current cardioversion when more euvolemic.  05/22 continue diuresis  with furosemide and inotropic support with milrinone. She fell out of the chair yesterday with no head trauma. No syncope.  05/23 milrinone infusion has been discontinued today.  05/24 renal function is not yet stable per serum cr.  Transitioned from IV amiodarone to po formulation.   Assessment and Plan: * Acute on chronic diastolic CHF (congestive heart failure) (HCC) Echocardiogram with preserved LV systolic function EF >75%, interventricular septum is flattened in systole and diastole. RV with severe enlargement, RV systolic function with moderate reduction, RVSP 45.9 mmHg. TR mild to moderate.   Acute on chronic core pulmonale.  Pulmonary hypertension (probably type 5 or type 1).  RV failure   V/Q scan with low probability for pulmonary embolism.   05/16 cardiac catheterization  PA 93/44 mean 66 PCWP mean 19 Cardiac output 3,9 and index 1,73 PVR 11,8   Mainly precapillary pulmonary hypertension, with mild left heart failure.   SV02 65,2  Urine output 800  ml Systolic blood pressure 145 to 149 mmHg.   SP sequential nephron blockade with:  Sp Acetazolamide.  Furosemide drip 15 mg per hr discontinued.  Metolazone on 05/16, 05/17,05/18, 05/20, 05/21/ 05/22.   Now off milrinone, limited pharmacologic options due to low GFR.   Sildenafil for pulmonary hypertension.   Persistent atrial fibrillation (HCC) Rate controlled atrial flutter -fibrillation.  05/18 failed direct current cardioversion. (TEE).  05/24 direct current cardioversion.   Patient continue on sinus rhythm.  Plan to continue amiodarone, transition to po today.  Continue telemetry monitoring.   Stage 4 chronic kidney disease (HCC) AKI, hyponatremia.   Renal function worsening per serum cr, today at 2,57 with K at 3,9 and  serum bicarbonate at 31. Na 131 and Mg 2.1   Diuretic therapy is on hold  Continue close renal function monitoring.   Essential (primary) hypertension Blood pressure has been stable.    Type 2 diabetes mellitus with hyperglycemia (HCC) Uncontrolled T2Dm with hyperglycemia.  Fasting glucose today is 143 mg/dl. Continue glucose cover and monitoring with insulin sliding scale.  Basal insulin to 10 units.    Malignant neoplasm of upper-outer quadrant of right breast in female, estrogen receptor positive (HCC) Follow up as outpatient.   Class 3 obesity (HCC) Calculated BMI is 47,3   Restless legs syndrome, had ropinarole.   Solitary pulmonary nodule on lung CT Follow up as outpatient.         Subjective: Patient with no chest pain, dyspnea continue to improve, she has been weak and deconditioned.   Physical Exam: Vitals:   10/05/22 2339 10/06/22 0144 10/06/22 0428 10/06/22 0712  BP: (!) 127/50  (!) 149/49 (!) 145/47  Pulse: 65  64 63  Resp: 19  15   Temp:   97.8 F (36.6 C) 98 F (36.7 C)  TempSrc:   Oral Oral  SpO2: 99%  100%   Weight:  114.7 kg    Height:       Neurology awake and alert ENT with mild pallor Cardiovascular with S1 and S2 present and regular with no gallops or rubs No JVD Trace lower extremity edema Respiratory with no rales or wheezing, no rhonchi Abdomen with no distention  Data Reviewed:    Family Communication: no family at the bedside   Disposition: Status is: Inpatient Remains inpatient appropriate because: heart and renal failure   Planned Discharge Destination: Home    Author: Coralie Keens, MD 10/06/2022 2:44 PM  For on call review [email or web address].

## 2022-10-06 NOTE — Telephone Encounter (Signed)
Patient Advocate Encounter  Prior Authorization for Sildenafil Citrate 20MG  tablets  has been approved.    PA# [id number] Insurance [employer] Medicare Part D Electronic Prior Authorization Form  Effective dates: 10/06/2022 through 05/15/2023  Patients co-pay is $4.50.     Roland Earl, CPhT Pharmacy Patient Advocate Specialist [facility] Patient Advocate Team Direct Number: [phone]  Fax: [phone]

## 2022-10-06 NOTE — Telephone Encounter (Signed)
Patient Advocate Encounter   Received notification that prior authorization for Sildenafil Citrate 20MG  tablets is required.   PA submitted on 10/06/2022 Key [id number] Insurance [employer] Medicare Part D Electronic Prior Authorization Form Status is pending       Roland Earl, CPhT Pharmacy Patient Advocate Specialist [facility] Patient Advocate Team Direct Number: [phone]  Fax: [phone]

## 2022-10-06 NOTE — Progress Notes (Addendum)
Patient ID: Sandra Schneider, female   DOB: 12/06/1955, 67 y.o.   MRN: [id number]     Advanced Heart Failure Rounding Note  PCP-Cardiologist: Sandra Rich, MD   Subjective:   Admitted with marked volume overload and new atrial flutter. 5/15 had syncopal episode. Heart rate 50. Unresponsive. Had brief CPR.  5/16 RHC markedly elevated RH filling pressures w/ low but not markedly low PAPi, mildly elevated PCWP, severe PAH and low CO w/ CI of 1.73 =>started on milrinone and Lasix gtt  5/17 TEE-guided DCCV to NSR but atrial flutter recurred later that day.  5/21 Milrinone decreased to 0.125. Sildenafil increased to 40 TID 5/23 Milrinone stopped. S/P DC-CV -->SR.    Overnight maintained SR and continued on amio drip 30 mg per hour.    Weight down another 4 lb, 35 lb total.  Scr 2.21>.2.38>2.6    Denies SOB. Ambulated 550 feet.   RHC 5/16 Hemodynamics (mmHg) RA 32 RV 95/32 PA 93/44, mean 66 PCWP mean 19 Oxygen saturations: PA 49% AO 96% Cardiac Output (Fick) 3.98  Cardiac Index (Fick) 1.73 PVR 11.8 PAPi 1.5   VQ -low probability for PE.   TEE - LV EF 60-65%, D-shaped septum, moderate RV dilation with moderate RV dysfunction, mod-severe TR.    Objective:   Weight Range: 114.7 kg Body mass index is 42.08 kg/m.   Vital Signs:   Temp:  [97.6 F (36.4 C)-97.9 F (36.6 C)] 97.8 F (36.6 C) (05/24 0428) Pulse Rate:  [64-85] 64 (05/24 0428) Resp:  [12-20] 15 (05/24 0428) BP: (53-149)/(12-120) 149/49 (05/24 0428) SpO2:  [90 %-100 %] 100 % (05/24 0428) FiO2 (%):  [32 %] 32 % (05/23 2339) Weight:  [114.7 kg] 114.7 kg (05/24 0144) Last BM Date : 10/05/22  Weight change: Filed Weights   10/04/22 0326 10/05/22 0540 10/06/22 0144  Weight: 118.6 kg 116.2 kg 114.7 kg    Intake/Output:   Intake/Output Summary (Last 24 hours) at 10/06/2022 0704 Last data filed at 10/06/2022 0035 Gross per 24 hour  Intake 452.32 ml  Output 800 ml  Net -347.68 ml   CVp 13     Physical Exam  General:  In bed.  No resp difficulty HEENT: normal Neck: supple. JVP difficult to assess due to body habitus. Carotids 2+ bilat; no bruits. No lymphadenopathy or thryomegaly appreciated. Cor: PMI nondisplaced. Regular rate & rhythm. No rubs, gallops or murmurs. Lungs: clear Abdomen: soft, nontender, nondistended. No hepatosplenomegaly. No bruits or masses. Good bowel sounds. Extremities: no cyanosis, clubbing, rash, edema. LUE PICC  Neuro: alert & orientedx3, cranial nerves grossly intact. moves all 4 extremities w/o difficulty. Affect pleasant    Telemetry   SR 60-80s   EKG    N./A  Labs    CBC Recent Labs    10/05/22 0530 10/06/22 0500  WBC 5.2 5.1  HGB 10.9* 11.2*  HCT 33.9* 35.5*  MCV 98.3 97.8  PLT 155 154   Basic Metabolic Panel Recent Labs    40/98/11 0530 10/06/22 0500  NA 132* 131*  K 3.9 3.9  CL 88* 88*  CO2 31 31  GLUCOSE 161* 143*  BUN 44* 43*  CREATININE 2.38* 2.57*  CALCIUM 9.5 9.6  MG 2.1 2.1   Liver Function Tests No results for input(s): "AST", "ALT", "ALKPHOS", "BILITOT", "PROT", "ALBUMIN" in the last 72 hours. No results for input(s): "LIPASE", "AMYLASE" in the last 72 hours. Cardiac Enzymes No results for input(s): "CKTOTAL", "CKMB", "CKMBINDEX", "TROPONINI" in the last 72 hours.  BNP: BNP (last 3 results) Recent Labs    09/06/22 1611 09/20/22 1105 09/26/22 1318  BNP 1,807.9* 2,885.7* 2,617.1*    ProBNP (last 3 results) No results for input(s): "PROBNP" in the last 8760 hours.   D-Dimer No results for input(s): "DDIMER" in the last 72 hours. Hemoglobin A1C No results for input(s): "HGBA1C" in the last 72 hours. Fasting Lipid Panel No results for input(s): "CHOL", "HDL", "LDLCALC", "TRIG", "CHOLHDL", "LDLDIRECT" in the last 72 hours. Thyroid Function Tests No results for input(s): "TSH", "T4TOTAL", "T3FREE", "THYROIDAB" in the last 72 hours.  Invalid input(s): "FREET3"   Other  results:   Imaging    EP STUDY  Result Date: 10/05/2022 See surgical note for result.    Medications:     Scheduled Medications:  apixaban  5 mg Oral BID   atorvastatin  40 mg Oral Daily   Chlorhexidine Gluconate Cloth  6 each Topical Daily   dorzolamide-timolol  1 drop Both Eyes BID   famotidine  20 mg Oral Daily   insulin aspart  0-5 Units Subcutaneous QHS   insulin aspart  0-9 Units Subcutaneous TID WC   insulin glargine-yfgn  10 Units Subcutaneous QHS   loratadine  10 mg Oral Daily   polyethylene glycol  17 g Oral Daily   sildenafil  40 mg Oral TID   sodium chloride flush  10-40 mL Intracatheter Q12H   sodium chloride flush  3 mL Intravenous Q12H   sodium chloride flush  3 mL Intravenous Q12H   torsemide  40 mg Oral BID    Infusions:  amiodarone 30 mg/hr (10/06/22 0506)    PRN Medications: acetaminophen **OR** acetaminophen, benzonatate, hydrALAZINE, loperamide, ondansetron **OR** ondansetron (ZOFRAN) IV, mouth rinse, sodium chloride flush    Patient Profile     Sandra Schneider is a 67 year old with a history of CAD (NSTEMI 06/2012 w/ PCI to LAD and Lcx) LVEF 55% at that time, OSA, HTN, hyperlipidemia, hx of breast ca s/p lumpectomy and HFpEF.    Admitted with A/C HFpEF--RV Failure and new onset Atrial Flutter.   Assessment/Plan   1. RV failure/pulmonary hypertension:   - RHC (9/23) with RA 30, RV 107/20-30, PA 107/48 (69), unable to wedge; LVEDP 15. AO 140/64. LVEDP ~16. Assuming a PCWP of 20, TPG would be 49 w/ PVR of 7.  Echo 3/24 with RV severely enlarged w/ severely reduced RV function.  Cardiac MRI in 5/24 with EF 75%, moderate RV dilation with RV EF 50%, moderate TR, mid-wall basal septal/basal inferolateral LGE.  Could be seen with cardiac sarcoidosis (vs prior myocarditis).  CT chest showed RUL nodule (negative on PET) and mediastinal LAN.  V/Q scan not suggestive of chronic PEs.  RHC 5/16 showed severe PAH with low CI 1.73, PAPi 1.5, RA pressure 32 (R>>L  heart failure). TEE this admission with LV EF 60-65%, D-shaped septum, moderate RV dilation with moderate RV dysfunction, mod-severe TR.  Likely end-stage PH/cor pulmonale d/t OHS/OSA and group 1 PAH. - Now off milrinone. CO-OX stable .  -Overall diuresed 35 pounds. CVP 13. Looks like this is as dry at we can get her.  - Worsening renal function. Hold torsemide today - Continue sildenafil 40 TID  - Consider ERA as outpatient - Sent autoimmune serologies => RF elevated, ANA/anti-centromere/SCL-70 negative.  - Needs eventual cardiac PET to assess for cardiac sarcoidosis.   - Moderate OSA on recent sleep study with severe O2 desats (low 70s), now using CPAP at night  2. Syncope:  -  Suspect this was due to severe PH/RV failure.  No arrhythmias noted on telemetry.   3. Atrial flutter:  - New diagnosis.  V-rates mildly elevated low 100s.  TEE-guided DCCV to NSR 05/17 but AFL recurred.  Amiodarone gtt started for rate control and to keep her in NSR after repeat DCCV.   - 5/23 S/P DCCV --> SR  - Stop IV amio and will transition to amio 200 mg twice a day  - Continue apixaban.  4. CAD:  - Remote PCI LAD and LCx.  - Continue statin.  - Stopped ASA with anticoagulation.   5. CKD stage 3:  - Creatinine baseline previously ~1.4 - Creatinine up slightly today, 2.21>2.38>2.67. Hold diuretics.  - Follow closely with diuretics and RV failure.  6. Hypokalemia/hypomagnesemia: - Replaced as needed.   7. Obesity: - BMI 43 - Referred to outpatient PharmD for GLP1RA to assist with weight loss  Ambulate   Amy Clegg NP-C  10/06/2022 7:04 AM  Patient seen and examined with the above-signed Advanced Practice Provider and/or Housestaff. I personally reviewed laboratory data, imaging studies and relevant notes. I independently examined the patient and formulated the important aspects of the plan. I have edited the note to reflect any of my changes or salient points. I have personally discussed the plan  with the patient and/or family.  Remains in NSR on IV amio.   Off diuretics. Weight down another 4 pounds. Almost 35 pound total. Denies CP or SOB  Edema much improved.   Scr trending up   General:  Obese woman No resp difficulty HEENT: normal Neck: supple. no JVD. Carotids 2+ bilat; no bruits. No lymphadenopathy or thryomegaly appreciated. Cor: PMI nondisplaced. Regular rate & rhythm. 2/6 TR Lungs: clear/decreased Abdomen: obese  soft, nontender, nondistended. No hepatosplenomegaly. No bruits or masses. Good bowel sounds. Extremities: no cyanosis, clubbing, rash, edema Neuro: alert & orientedx3, cranial nerves grossly intact. moves all 4 extremities w/o difficulty. Affect pleasant  Volume status much improved. Remains in NSR after DC-CV. Renal function worse   Continue to hold diuretics until SCr improves.   Continue sildenafil. Application put in for ERA as outpatient.   Continue Eliquis. Switch amio to po  Reinforced need for CPAP.   Arvilla Meres, MD  2:20 PM

## 2022-10-07 ENCOUNTER — Other Ambulatory Visit (HOSPITAL_COMMUNITY): Payer: Self-pay

## 2022-10-07 ENCOUNTER — Other Ambulatory Visit: Payer: Self-pay

## 2022-10-07 DIAGNOSIS — I5033 Acute on chronic diastolic (congestive) heart failure: Secondary | ICD-10-CM | POA: Diagnosis not present

## 2022-10-07 LAB — CBC
HCT: 34.2 % — ABNORMAL LOW (ref 36.0–46.0)
Hemoglobin: 11.1 g/dL — ABNORMAL LOW (ref 12.0–15.0)
MCH: 31.4 pg (ref 26.0–34.0)
MCHC: 32.5 g/dL (ref 30.0–36.0)
MCV: 96.9 fL (ref 80.0–100.0)
Platelets: 159 10*3/uL (ref 150–400)
RBC: 3.53 MIL/uL — ABNORMAL LOW (ref 3.87–5.11)
RDW: 14.6 % (ref 11.5–15.5)
WBC: 5.1 10*3/uL (ref 4.0–10.5)
nRBC: 0 % (ref 0.0–0.2)

## 2022-10-07 LAB — COOXEMETRY PANEL
Carboxyhemoglobin: 2.2 % — ABNORMAL HIGH (ref 0.5–1.5)
Methemoglobin: <0.7 % (ref 0.0–1.5)
O2 Saturation: 73 %
Total hemoglobin: 11.6 g/dL — ABNORMAL LOW (ref 12.0–16.0)

## 2022-10-07 LAB — RENAL FUNCTION PANEL
Albumin: 3.5 g/dL (ref 3.5–5.0)
Anion gap: 12 (ref 5–15)
BUN: 42 mg/dL — ABNORMAL HIGH (ref 8–23)
CO2: 30 mmol/L (ref 22–32)
Calcium: 9.1 mg/dL (ref 8.9–10.3)
Chloride: 91 mmol/L — ABNORMAL LOW (ref 98–111)
Creatinine, Ser: 2.61 mg/dL — ABNORMAL HIGH (ref 0.44–1.00)
GFR, Estimated: 19 mL/min — ABNORMAL LOW (ref >60)
Glucose, Bld: 103 mg/dL — ABNORMAL HIGH (ref 70–99)
Phosphorus: 4.2 mg/dL (ref 2.5–4.6)
Potassium: 3.2 mmol/L — ABNORMAL LOW (ref 3.5–5.1)
Sodium: 133 mmol/L — ABNORMAL LOW (ref 135–145)

## 2022-10-07 LAB — MAGNESIUM: Magnesium: 2 mg/dL (ref 1.7–2.4)

## 2022-10-07 LAB — GLUCOSE, CAPILLARY
Glucose-Capillary: 111 mg/dL — ABNORMAL HIGH (ref 70–99)
Glucose-Capillary: 183 mg/dL — ABNORMAL HIGH (ref 70–99)

## 2022-10-07 MED ORDER — POTASSIUM CHLORIDE CRYS ER 20 MEQ PO TBCR
40.0000 meq | EXTENDED_RELEASE_TABLET | Freq: Two times a day (BID) | ORAL | Status: DC
  Administered 2022-10-07: 40 meq via ORAL
  Filled 2022-10-07: qty 2

## 2022-10-07 MED ORDER — POTASSIUM CHLORIDE CRYS ER 10 MEQ PO TBCR
20.0000 meq | EXTENDED_RELEASE_TABLET | Freq: Every day | ORAL | 11 refills | Status: DC
  Filled 2022-10-07: qty 60, 30d supply, fill #0

## 2022-10-07 MED ORDER — TORSEMIDE 20 MG PO TABS
40.0000 mg | ORAL_TABLET | Freq: Two times a day (BID) | ORAL | 5 refills | Status: DC
  Filled 2022-10-07: qty 120, 30d supply, fill #0

## 2022-10-07 MED ORDER — SILDENAFIL CITRATE 20 MG PO TABS
40.0000 mg | ORAL_TABLET | Freq: Three times a day (TID) | ORAL | 5 refills | Status: DC
  Filled 2022-10-07: qty 180, 30d supply, fill #0

## 2022-10-07 MED ORDER — APIXABAN 5 MG PO TABS
5.0000 mg | ORAL_TABLET | Freq: Two times a day (BID) | ORAL | 5 refills | Status: DC
  Filled 2022-10-07: qty 60, 30d supply, fill #0

## 2022-10-07 MED ORDER — AMIODARONE HCL 200 MG PO TABS
200.0000 mg | ORAL_TABLET | Freq: Two times a day (BID) | ORAL | 5 refills | Status: DC
  Filled 2022-10-07: qty 60, 30d supply, fill #0

## 2022-10-07 NOTE — Progress Notes (Signed)
Mobility Specialist Progress Note    10/07/22 1039  Mobility  Activity Ambulated with assistance in hallway  Level of Assistance Contact guard assist, steadying assist  Assistive Device Four wheel walker  Distance Ambulated (ft) 420 ft  Activity Response Tolerated well  Mobility Referral Yes  $Mobility charge 1 Mobility  Mobility Specialist Start Time (ACUTE ONLY) 1012  Mobility Specialist Stop Time (ACUTE ONLY) 1039  Mobility Specialist Time Calculation (min) (ACUTE ONLY) 27 min   Pre-Mobility: 67 HR During Mobility: 72 HR Post-Mobility: 72 HR  Pt received in bed and agreeable. C/o 9-10/10 R foot pain. Returned to standing at sink for personal care. RN notified.   [place] Mobility Specialist  Please Neurosurgeon or Rehab Office at [phone]

## 2022-10-07 NOTE — Progress Notes (Signed)
PROGRESS NOTE    Sandra Schneider  ZOX:[id number] DOB: [birth year] DOA: 09/26/2022 PCP: [facility]  67/F w/ diastolic CHF, CKD 3b, CAD, breast cancer, type 2 diabetes mellitus, hypertension presented to the ED with worsening dyspnea and lower extremity edema X few weeks. -In the ED creatinine was 2.2, BNP 2671, troponin 48, hemoglobin 12.5, chest x-ray noted cardiomegaly pulmonary vascular congestion, bilateral interstitial infiltrates and right upper lobe nodule  -Volume overloaded with atrial flutter on admission  5/15 had syncopal episode. Heart rate 50. Unresponsive. Had brief CPR.  5/16 RHC markedly elevated RH filling pressures w/ low but not markedly low PAPi, mildly elevated PCWP, severe PAH and low CO w/ CI of 1.73 =>started on milrinone and Lasix gtt  5/17 TEE-guided DCCV to NSR but atrial flutter recurred  5/21 Milrinone decreased, Sildenafil increased  5/23 Milrinone stopped. S/P DCCV -->SR. 5/24: Mild uptrend in creatinine, diuretics held  Subjective: -feels ok, no other complaints, wants to go home  Assessment and Plan:  Acute on chronic diastolic CHF  RV failure, pulmonary hypertension  -Echo this admission noted EF > 75%, severe RV dilation with moderate reduction in RV function  -V/Q scan with low probability for pulmonary embolism.  -RHC 5/16-severe PAH, low cardiac index -Started on Lasix and milrinone for end-stage PAH/cor pulmonale due to OSA/OHS and Group 1 PAH-diuresed aggressively, down 35 LB -Now off milrinone and Lasix gtt. -GDMT limited by CKD, creatinine higher than baseline, diuretics on hold now -Started on sildenafil -Needs CPAP at discharge, TOC following  Persistent atrial fibrillation (HCC) Rate controlled atrial flutter -fibrillation.  05/18 failed direct current cardioversion. (TEE).  05/24 -DCCV > SR -Amio changed to p.o.,  Continue apixaban  Stage 4 chronic kidney disease (HCC) AKI, hyponatremia.  -Uptrend in  creatinine to 2.6 today, 2.1 on admission -Holding diuretics  Type 2 diabetes mellitus with hyperglycemia (HCC) Uncontrolled T2Dm with hyperglycemia.  -Improving, continue glargine and sliding scale  Malignant neoplasm of upper-outer quadrant of right breast in female, estrogen receptor positive (HCC) Follow up as outpatient.   Class 3 obesity (HCC) Calculated BMI is 47,3   Restless legs syndrome, had ropinarole.   Solitary pulmonary nodule on lung CT Follow up as outpatient.   DVT prophylaxis: Apixaban Code Status: Full code Family Communication: None present at Disposition Plan: Home tomorrow if stable  Consultants:    Procedures:   Antimicrobials:    Objective: Vitals:   10/06/22 1550 10/06/22 2014 10/06/22 2330 10/07/22 0230  BP: (!) 151/59 (!) 139/50 (!) 142/45 (!) 160/72  Pulse: 67 67 60 65  Resp: (!) 22 16 18 17   Temp: 98 F (36.7 C) 97.6 F (36.4 C) 97.8 F (36.6 C) 97.6 F (36.4 C)  TempSrc: Oral Oral Oral Oral  SpO2:  94% 100% 98%  Weight:      Height:        Intake/Output Summary (Last 24 hours) at 10/07/2022 0528 Last data filed at 10/07/2022 0300 Gross per 24 hour  Intake 436.04 ml  Output 1000 ml  Net -563.96 ml   Filed Weights   10/04/22 0326 10/05/22 0540 10/06/22 0144  Weight: 118.6 kg 116.2 kg 114.7 kg    Examination:  General exam: Appears calm and comfortable  Respiratory system: Clear to auscultation Cardiovascular system: S1 & S2 heard, RRR.  Abd: nondistended, soft and nontender.Normal bowel sounds heard. Central nervous system: Alert and oriented. No focal neurological deficits. Extremities: Trace edema Skin: No rashes Psychiatry:  Mood & affect appropriate.  Data Reviewed:   CBC: Recent Labs  Lab 10/02/22 0532 10/03/22 0415 10/04/22 0405 10/05/22 0530 10/06/22 0500  WBC 7.5 6.3 6.2 5.2 5.1  HGB 10.5* 10.2* 10.6* 10.9* 11.2*  HCT 33.4* 33.2* 33.3* 33.9* 35.5*  MCV 100.0 100.9* 98.5 98.3 97.8  PLT 163 152  156 155 154   Basic Metabolic Panel: Recent Labs  Lab 10/02/22 0532 10/02/22 1550 10/03/22 0415 10/04/22 0405 10/05/22 0530 10/06/22 0500  NA 132* 128* 129* 129* 132* 131*  K 3.5 3.4* 3.8 3.8 3.9 3.9  CL 90* 86* 87* 88* 88* 88*  CO2 29 29 30 29 31 31   GLUCOSE 211* 320* 218* 213* 161* 143*  BUN 45* 43* 46* 45* 44* 43*  CREATININE 2.18* 2.10* 2.22* 2.21* 2.38* 2.57*  CALCIUM 9.1 9.1 9.1 9.2 9.5 9.6  MG 1.8  --  2.2 2.2 2.1 2.1   GFR: Estimated Creatinine Clearance: 26.5 mL/min (A) (by C-G formula based on SCr of 2.57 mg/dL (H)). Liver Function Tests: No results for input(s): "AST", "ALT", "ALKPHOS", "BILITOT", "PROT", "ALBUMIN" in the last 168 hours. No results for input(s): "LIPASE", "AMYLASE" in the last 168 hours. No results for input(s): "AMMONIA" in the last 168 hours. Coagulation Profile: Recent Labs  Lab 10/05/22 0530  INR 1.9*   Cardiac Enzymes: No results for input(s): "CKTOTAL", "CKMB", "CKMBINDEX", "TROPONINI" in the last 168 hours. BNP (last 3 results) No results for input(s): "PROBNP" in the last 8760 hours. HbA1C: No results for input(s): "HGBA1C" in the last 72 hours. CBG: Recent Labs  Lab 10/05/22 2112 10/06/22 0616 10/06/22 1134 10/06/22 1601 10/06/22 2110  GLUCAP 152* 133* 167* 206* 189*   Lipid Profile: No results for input(s): "CHOL", "HDL", "LDLCALC", "TRIG", "CHOLHDL", "LDLDIRECT" in the last 72 hours. Thyroid Function Tests: No results for input(s): "TSH", "T4TOTAL", "FREET4", "T3FREE", "THYROIDAB" in the last 72 hours. Anemia Panel: No results for input(s): "VITAMINB12", "FOLATE", "FERRITIN", "TIBC", "IRON", "RETICCTPCT" in the last 72 hours. Urine analysis: No results found for: "COLORURINE", "APPEARANCEUR", "LABSPEC", "PHURINE", "GLUCOSEU", "HGBUR", "BILIRUBINUR", "KETONESUR", "PROTEINUR", "UROBILINOGEN", "NITRITE", "LEUKOCYTESUR" Sepsis Labs: @LABRCNTIP (procalcitonin:4,lacticidven:4)  ) Recent Results (from the past 240 hour(s))   MRSA Next Gen by PCR, Nasal     Status: None   Collection Time: 09/29/22 10:35 PM   Specimen: Nasal Mucosa; Nasal Swab  Result Value Ref Range Status   MRSA by PCR Next Gen NOT DETECTED NOT DETECTED Final    Comment: (NOTE) The GeneXpert MRSA Assay (FDA approved for NASAL specimens only), is one component of a comprehensive MRSA colonization surveillance program. It is not intended to diagnose MRSA infection nor to guide or monitor treatment for MRSA infections. Test performance is not FDA approved in patients less than 61 years old. Performed at [facility], [address], [place], [place] [address]      Radiology Studies: EP STUDY  Result Date: 10/05/2022 See surgical note for result.    Scheduled Meds:  amiodarone  200 mg Oral BID   apixaban  5 mg Oral BID   atorvastatin  40 mg Oral Daily   Chlorhexidine Gluconate Cloth  6 each Topical Daily   dorzolamide-timolol  1 drop Both Eyes BID   famotidine  20 mg Oral Daily   insulin aspart  0-5 Units Subcutaneous QHS   insulin aspart  0-9 Units Subcutaneous TID WC   insulin glargine-yfgn  10 Units Subcutaneous QHS   loratadine  10 mg Oral Daily   polyethylene glycol  17 g Oral Daily  sildenafil  40 mg Oral TID   sodium chloride flush  10-40 mL Intracatheter Q12H   sodium chloride flush  3 mL Intravenous Q12H   sodium chloride flush  3 mL Intravenous Q12H   Continuous Infusions:   LOS: 11 days    Time spent:    Zannie Cove, MD [facility]   10/07/2022, 5:28 AM

## 2022-10-07 NOTE — Discharge Summary (Signed)
Physician Discharge Summary  Sandra Schneider ZOX:[id number] DOB: 1956/04/07 DOA: 09/26/2022  PCP: [facility]  Admit date: 09/26/2022 Discharge date: 10/07/2022  Time spent: 35 minutes  Recommendations for Outpatient Follow-up:  Advanced heart failure team in 10 days, please check BMP at follow-up Resume torsemide 40 Mg twice daily on Monday 5/27 PCP in 1 week Pulmonary nodule noted on prior lung CT needs follow-up   Discharge Diagnoses:  Principal Problem:   Acute on chronic diastolic CHF Severe right ventricular failure Pulmonary hypertension AKI on CKD stage IV   Persistent atrial fibrillation (HCC)   Stage 4 chronic kidney disease (HCC)   Essential (primary) hypertension   Malignant neoplasm of upper-outer quadrant of right breast in female, estrogen receptor positive (HCC)   Type 2 diabetes mellitus with hyperglycemia (HCC)   Class 3 obesity (HCC)   Solitary pulmonary nodule on lung CT   RVF (right ventricular failure) (HCC)   Discharge Condition: Improved  Diet recommendation: Low so DM, diabetic  Filed Weights   10/05/22 0540 10/06/22 0144 10/07/22 0630  Weight: 116.2 kg 114.7 kg 113.4 kg    History of present illness:  67/F w/ diastolic CHF, CKD 3b, CAD, breast cancer, type 2 diabetes mellitus, hypertension presented to the ED with worsening dyspnea and lower extremity edema X few weeks. -In the ED creatinine was 2.2, BNP 2671, troponin 48, hemoglobin 12.5, chest x-ray noted cardiomegaly pulmonary vascular congestion, bilateral interstitial infiltrates and right upper lobe nodule   Hospital Course:   Acute on chronic diastolic CHF  Severe RV failure, pulmonary hypertension  -Echo this admission noted EF > 75%, severe RV dilation with moderate reduction in RV function  -V/Q scan with low probability for pulmonary embolism.  -RHC 5/16-severe PAH, low cardiac index -Started on Lasix and milrinone for end-stage PAH/cor pulmonale due  to OSA/OHS and Group 1 PAH-diuresed aggressively, down 35 LB -Now off milrinone and Lasix gtt. -GDMT limited by CKD, creatinine higher than baseline, plan to resume torsemide 40 Mg twice daily on Monday 5/27 -Started on sildenafil -CPAP set up at discharge, follow-up with heart failure clinic   Persistent atrial fibrillation (HCC) Rate controlled atrial flutter -fibrillation.  -5/18 failed direct current cardioversion. (TEE).  -/24 -DCCV > SR -Amio changed to p.o.,  Continue apixaban   Stage 4 chronic kidney disease (HCC) AKI, hyponatremia.  -Uptrend in creatinine to 2.6 today, 2.1 on admission -Plan to resume diuretics in 2 days   Type 2 diabetes mellitus with hyperglycemia (HCC) -Was on low-dose glargine this admission -Metformin discontinued on account of CKD -Last HbA1c was 5.4, discharged home without diabetic meds, recommend monitor CBG as outpatient, could consider low-dose glipizide if glucose trends up   Malignant neoplasm of upper-outer quadrant of right breast in female, estrogen receptor positive (HCC) Follow up as outpatient.    Class 3 obesity (HCC) Calculated BMI is 47,3    Restless legs syndrome, had ropinarole.    Solitary pulmonary nodule on lung CT Follow up as outpatient.    Consultants Advanced heart failure team  Discharge Exam: Vitals:   10/07/22 0737 10/07/22 1124  BP: 134/88 (!) 157/62  Pulse: 67 66  Resp: 16 20  Temp: 97.6 F (36.4 C) (!) 97.4 F (36.3 C)  SpO2: 90% 91%    Gen: Awake, Alert, Oriented X 3,  HEENT: no JVD Lungs: Good air movement bilaterally, CTAB CVS: S1S2/RRR Abd: soft, Non tender, non distended, BS present Extremities: No edema Skin: no new rashes on exposed skin  Discharge Instructions   Discharge Instructions     AMB Referral to [facility] Pharm-D   Complete by: As directed    Can you please evaluate for Semaglutide   Reason For Referral: Medication Management   Amb referral to AFIB Clinic   Complete by: As  directed    Diet - low sodium heart healthy   Complete by: As directed    Diet Carb Modified   Complete by: As directed    Increase activity slowly   Complete by: As directed       Allergies as of 10/07/2022   No Known Allergies      Medication List     STOP taking these medications    amLODipine 10 MG tablet Commonly known as: NORVASC   aspirin EC 81 MG tablet   furosemide 20 MG tablet Commonly known as: LASIX   isosorbide-hydrALAZINE 20-37.5 MG tablet Commonly known as: BIDIL   metFORMIN 1000 MG tablet Commonly known as: GLUCOPHAGE   metoprolol tartrate 50 MG tablet Commonly known as: LOPRESSOR       TAKE these medications    acetaminophen 500 MG tablet Commonly known as: TYLENOL Take 1,000 mg by mouth daily as needed for moderate pain, fever or headache.   amiodarone 200 MG tablet Commonly known as: PACERONE Take 1 tablet (200 mg total) by mouth 2 (two) times daily.   atorvastatin 40 MG tablet Commonly known as: LIPITOR Take 1 tablet by mouth once daily   cetirizine 10 MG tablet Commonly known as: ZYRTEC Take 10 mg by mouth daily.   dorzolamide-timolol 2-0.5 % ophthalmic solution Commonly known as: COSOPT Place 1 drop into both eyes 2 (two) times daily.   Eliquis 5 MG Tabs tablet Generic drug: apixaban Take 1 tablet (5 mg total) by mouth 2 (two) times daily.   letrozole 2.5 MG tablet Commonly known as: FEMARA Take 2.5 mg by mouth daily.   multivitamin tablet Take 1 tablet by mouth daily.   potassium chloride 10 MEQ tablet Commonly known as: KLOR-CON M Take 2 tablets (20 mEq total) by mouth daily. What changed: how much to take   sildenafil 20 MG tablet Commonly known as: REVATIO Take 2 tablets (40 mg total) by mouth 3 (three) times daily. What changed: how much to take   torsemide 20 MG tablet Commonly known as: DEMADEX Take 2 tablets (40 mg total) by mouth 2 (two) times daily. Start taking on: Oct 09, 2022   VITAMIN C  PO Take 1 tablet by mouth daily.   VITAMIN D-3 PO Take 1 capsule by mouth daily.               Durable Medical Equipment  (From admission, onward)           Start     Ordered   10/06/22 1641  For home use only DME oxygen  Once       Comments: Please evaluate for light weight POC  Question Answer Comment  Length of Need Lifetime   Mode or (Route) Nasal cannula   Liters per Minute 2   Frequency Continuous (stationary and portable oxygen unit needed)   Oxygen delivery system Gas      10/06/22 1640   10/04/22 1127  For home use only DME 4 wheeled rolling walker with seat  Once       Question:  Patient needs a walker to treat with the following condition  Answer:  Heart failure (HCC)   10/04/22 1126   10/02/22  1015  For home use only DME 3 n 1  Once        10/02/22 1014           No Known Allergies  Follow-up Information     [facility] Follow up on 10/18/2022.   Specialty: Cardiology Why: Advanced Heart Failure Clinic 10:40 am Entrance C, Free Valet Parking Please bring all medications to appointment Contact information: [address] [id number] mc [place] [address] [phone]        [facility] Follow up.   Specialty: Pulmonology Why: please call to follow up on CPAP with provider Contact information: [address] [place] [address] [phone]        [facility] Follow up.   Specialty: Home Health Services Why: Home Health RN, physical therapy and occupational therapy-agency will call with appt Contact information: [address] [place] [address] [phone]                  The results of significant diagnostics from this hospitalization (including imaging, microbiology, ancillary and laboratory) are listed below for reference.    Significant Diagnostic Studies: EP STUDY  Result Date:  02-Nov-2022 See surgical note for result.  ECHO TEE  Result Date: 09/29/2022    TRANSESOPHOGEAL ECHO REPORT   Patient Name:   AARIN FRYLING Date of Exam: 09/29/2022 Medical Rec #:  [id number]              Height:       65.0 in Accession #:    [id number]             Weight:       282.0 lb Date of Birth:  1955/10/17               BSA:          2.289 m Patient Age:    67 years               BP:           183/77 mmHg Patient Gender: F                      HR:           112 bpm. Exam Location:  Inpatient Procedure: Transesophageal Echo, Color Doppler and Cardiac Doppler Indications:     atrial flutter  History:         Patient has prior history of Echocardiogram examinations, most                  recent 09/08/2022. CHF, chronic kidney disease; Risk                  Factors:Hypertension, Diabetes, Dyslipidemia and Sleep Apnea.  Sonographer:     Delcie Roch RDCS Referring Phys:  [id number] MCLEAN Diagnosing Phys: Wilfred Lacy PROCEDURE: After discussion of the risks and benefits of a TEE, an informed consent was obtained from the patient. The transesophogeal probe was passed without difficulty through the esophogus of the patient. Imaged were obtained with the patient in a left lateral decubitus position. Sedation performed by different physician. The patient was monitored while under deep sedation. Anesthestetic sedation was provided intravenously by Anesthesiology: 200mg  of Propofol. The patient developed no complications during the procedure. A direct current cardioversion was performed.  IMPRESSIONS  1. Left ventricular ejection fraction, by estimation,  is 60 to 65%. The left ventricle has normal function. The left ventricle has no regional wall motion abnormalities.  2. D-shaped septum suggesting RV pressure/volume overload. Peak RV-RA gradient 83 mmHg. Right ventricular systolic function is moderately reduced. The right ventricular size is moderately enlarged. Mildly increased right ventricular  wall thickness.  3. No left atrial/left atrial appendage thrombus was detected.  4. Right atrial size was mild to moderately dilated. Interatrial septum was shifted to the left.  5. No PFO or ASD by color doppler.  6. The tricuspid valve is abnormal. Tricuspid valve regurgitation is moderate to severe.  7. The aortic valve is tricuspid. Aortic valve regurgitation is not visualized. No aortic stenosis is present.  8. The mitral valve is normal in structure. Trivial mitral valve regurgitation. No evidence of mitral stenosis.  9. Normal caliber thoracic aorta with mild plaque. FINDINGS  Left Ventricle: Left ventricular ejection fraction, by estimation, is 60 to 65%. The left ventricle has normal function. The left ventricle has no regional wall motion abnormalities. The left ventricular internal cavity size was small. Right Ventricle: D-shaped septum suggesting RV pressure/volume overload. Peak RV-RA gradient 83 mmHg. The right ventricular size is moderately enlarged. Mildly increased right ventricular wall thickness. Right ventricular systolic function is moderately reduced. Left Atrium: Left atrial size was normal in size. No left atrial/left atrial appendage thrombus was detected. Right Atrium: Right atrial size was mild to moderately dilated. Pericardium: Trivial pericardial effusion is present. Mitral Valve: The mitral valve is normal in structure. Trivial mitral valve regurgitation. No evidence of mitral valve stenosis. Tricuspid Valve: The tricuspid valve is abnormal. Tricuspid valve regurgitation is moderate to severe. Aortic Valve: The aortic valve is tricuspid. Aortic valve regurgitation is not visualized. No aortic stenosis is present. Pulmonic Valve: The pulmonic valve was normal in structure. Pulmonic valve regurgitation is not visualized. Aorta: Normal caliber thoracic aorta with mild plaque. The aortic root is normal in size and structure. IAS/Shunts: No PFO or ASD by color doppler. Dalton [employer]el  Electronically signed by Wilfred Lacy Signature Date/Time: 09/29/2022/5:38:06 PM    Final    EP STUDY  Result Date: 09/29/2022 See surgical note for result.  DG Chest Port 1 View  Result Date: 09/28/2022 CLINICAL DATA:  PICC placement EXAM: PORTABLE CHEST 1 VIEW COMPARISON:  09/26/2022 FINDINGS: Interim placement of left upper extremity central venous catheter with tip projecting over the upper SVC. Low lung volumes. Cardiomegaly. Patchy basilar opacities likely atelectasis. No pneumothorax. IMPRESSION: Interim placement of left upper extremity central venous catheter with tip projecting over the upper SVC. Hypoventilatory changes with cardiomegaly and patchy basilar atelectasis. Electronically Signed   By: Jasmine Pang M.D.   On: 09/28/2022 20:55   CARDIAC CATHETERIZATION  Result Date: 09/28/2022 1. Markedly elevated right heart filling pressures with low but not markedly low PAPi.  2. Mildly elevated PCWP 3. Severe pulmonary arterial hypertension. 4. Low cardiac output.   [place] EKG SITE RITE  Result Date: 09/28/2022 If Site Rite image not attached, placement could not be confirmed due to current cardiac rhythm.  NM Pulmonary Perfusion  Result Date: 09/27/2022 CLINICAL DATA:  Chest pain EXAM: NUCLEAR MEDICINE PERFUSION LUNG SCAN TECHNIQUE: Perfusion images were obtained in multiple projections after intravenous injection of radiopharmaceutical. Ventilation scans intentionally deferred if perfusion scan and chest x-ray adequate for interpretation during COVID 19 epidemic. RADIOPHARMACEUTICALS:  4.4 mCi Tc-40m MAA IV COMPARISON:  Chest radiographs done on 09/26/2022. FINDINGS: There are no wedge-shaped segmental or subsegmental perfusion defects. In LPO projection,  there is subtle ill-defined decreased tracer activity in the posterior left mid lung field. This finding could not be definitely localized in the rest of the images. IMPRESSION: Imaging findings suggest low probability for pulmonary  embolism. Electronically Signed   By: Ernie Avena M.D.   On: 09/27/2022 15:52   DG Chest 2 View  Result Date: 09/26/2022 CLINICAL DATA:  Shortness of breath EXAM: CHEST - 2 VIEW COMPARISON:  09/25/2022 and prior radiographs FINDINGS: The cardiomediastinal silhouette is unremarkable. RIGHT UPPER lobe nodule and mild peribronchial thickening are again identified. There is no evidence of airspace disease, consolidation, mass, pleural effusion or pneumothorax. No acute bony abnormalities are identified. IMPRESSION: 1. No evidence of acute cardiopulmonary disease. 2. RIGHT UPPER lobe nodule, chest CT follow-up recommended as per 03/30/2022 PET CT report. Electronically Signed   By: Harmon Pier M.D.   On: 09/26/2022 12:37   DG Chest 2 View  Result Date: 09/25/2022 CLINICAL DATA:  SOB EXAM: CHEST - 2 VIEW COMPARISON:  09/08/2022 FINDINGS: Cardiomediastinal silhouette and pulmonary vasculature are within normal limits. 9 mm right upper lobe lung nodule is again seen, but better appreciated on prior CT from 03/02/2022. Follow-up chest CT should be performed when patient condition allows as suggested by PET-CT from 03/30/2022. Lungs are otherwise clear. Calcifications adjacent to the right greater tuberosity consistent with calcific rotator cuff tendinosis. IMPRESSION: 1. No acute cardiopulmonary process. 2. 9 mm right upper lobe pulmonary nodule again seen. Follow-up chest CT should be performed when patient condition allows as suggested by PET-CT from 03/30/2022. Electronically Signed   By: Acquanetta Belling M.D.   On: 09/25/2022 11:56   US Venous Img Lower Bilateral (DVT)  Result Date: 09/25/2022 CLINICAL DATA:  Worsening shortness of breath Edema History of breast cancer EXAM: Bilateral lower Extremity Venous Doppler Ultrasound TECHNIQUE: Gray-scale sonography with compression, as well as color and duplex ultrasound, were performed to evaluate the deep venous system(s) from the level of the common femoral  vein through the popliteal and proximal calf veins. COMPARISON:  None available FINDINGS: VENOUS Normal compressibility of the common femoral, superficial femoral, and popliteal veins, as well as the visualized calf veins. Visualized portions of profunda femoral vein and great saphenous vein unremarkable. No filling defects to suggest DVT on grayscale or color Doppler imaging. Doppler waveforms show normal direction of venous flow, normal respiratory plasticity and response to augmentation. OTHER None. Limitations: none IMPRESSION: No  lower extremity DVT. Electronically Signed   By: Acquanetta Belling M.D.   On: 09/25/2022 11:53   Cpap titration  Result Date: 09/18/2022 Oretha Milch, MD     09/19/2022  8:51 AM Patient Name: Lillianne, Keddy Date: 09/18/2022 Gender: Female D.O.B: 28-Nov-1955 Age (years): 55 Referring Provider: Cyril Mourning MD, ABSM Height (inches): 65 Interpreting Physician: Cyril Mourning MD, ABSM Weight (lbs): 287 RPSGT: Alfonso Ellis BMI: 48 MRN: [id number] Neck Size: 17.50 <br> <br> CLINICAL INFORMATION The patient is referred for a CPAP titration to treat sleep apnea. Date of HST: 06/2022 severe  OSA with AHI 40/ hr and lowest: 50%! SLEEP STUDY TECHNIQUE As per the AASM Manual for the Scoring of Sleep and Associated Events v2.3 (April 2016) with a hypopnea requiring 4% desaturations. The channels recorded and monitored were frontal, central and occipital EEG, electrooculogram (EOG), submentalis EMG (chin), nasal and oral airflow, thoracic and abdominal wall motion, anterior tibialis EMG, snore microphone, electrocardiogram, and pulse oximetry. Continuous positive airway pressure (CPAP) was initiated at the beginning of the study and titrated to  treat sleep-disordered breathing. MEDICATIONS Medications self-administered by patient taken the night of the study : FUROSEMIDE, isosorbide hydralazine, METFORMIN, METOPROLOL TARTRATE TECHNICIAN COMMENTS Comments added by technician: Patient had  difficulty initiating sleep. Patient C/O leg pain and movement; Patient walked around, after lights out time. She stated her legs was restless. Patient tolerated CPAP pressure fairly well. CPAP therapy started at 4 CWP, adding heated humidification. Titration incresed to 15 CWP in REM stage; Patient awakened out of REM stage, stating pressure was too high. Tech. then applied an EPR of 3. Optimal pressure of 15 CWP in lateral positions but supine positon was not observed (patient stated she couldn't sleep on her back). ECG = PVC, VT; please note PRINT OUT of page 529. ALTHOUGH, an EPR of 3 used during final CPAP pressure of 15 CWP, the EPR was not used at final pressure in REM stages, mainly due to obvious events. O2 supplement of 2 lpm added to CPAP therapy due to the sleep labs' protocol Comments added by scorer: N/A RESPIRATORY PARAMETERS Optimal PAP Pressure (cm): 15 AHI at Optimal Pressure (/hr): 1.9 Overall Minimal O2 (%): 73.00 Supine % at Optimal Pressure (%): 0 Minimal O2 at Optimal Pressure (%): 84.0 SLEEP ARCHITECTURE The study was initiated at 10:55:37 PM and ended at 5:37:39 AM. Sleep onset time was 96.8 minutes and the sleep efficiency was 61.9%. The total sleep time was 249 minutes. The patient spent 5.82% of the night in stage N1 sleep, 41.97% in stage N2 sleep, 9.44% in stage N3 and 42.8% in REM.Stage REM latency was 74.5 minutes Wake after sleep onset was 56.3. Alpha intrusion was absent. Supine sleep was 0.00%. CARDIAC DATA The 2 lead EKG demonstrated sinus rhythm. The mean heart rate was 74.74 beats per minute. Other EKG findings include: Ventricular Tachycardia, PVCs. LEG MOVEMENT DATA The total Periodic Limb Movements of Sleep (PLMS) were 364. The PLMS index was 87.71. A PLMS index of <15 is considered normal in adults. IMPRESSIONS - The optimal PAP pressure was 15 cm of water. - Central sleep apnea was not noted during this titration (CAI = 0.2/h). - Severe oxygen desaturations were observed  during this titration (min O2 = 73.00%).2 L O2 was added due to persistent desaturations on final CPAP level - The patient snored with moderate snoring volume during this titration study. - 2-lead EKG demonstrated: Ventricular Tachycardia, PVCs - Severe periodic limb movements were observed during this study. Arousals associated with PLMs were significant. DIAGNOSIS - Obstructive Sleep Apnea (G47.33) - Nocturnal hypoxia - Periodic Limb Movement During Sleep (G47.61) RECOMMENDATIONS - Trial of CPAP therapy on 15 cm H2O with a Small size Fisher&Paykel Full Face Simplus mask and heated humidification. - 2L O2 was blended into CPAP - Avoid alcohol, sedatives and other CNS depressants that may worsen sleep apnea and disrupt normal sleep architecture. - Sleep hygiene should be reviewed to assess factors that may improve sleep quality. - Weight management and regular exercise should be initiated or continued. - Return to Sleep Center for re-evaluation after 4 weeks of therapy Cyril Mourning MD Board Certified in Sleep medicine   MR CARDIAC VELOCITY FLOW MAP  Result Date: 09/14/2022 CLINICAL DATA:  RV failure EXAM: CARDIAC MRI TECHNIQUE: The patient was scanned on a 1.5 Tesla GE magnet. A dedicated cardiac coil was used. Functional imaging was done using Fiesta sequences. 2,3, and 4 chamber views were done to assess for RWMA's. Modified Simpson's rule using a short axis stack was used to calculate an ejection fraction on a  dedicated work [employer] officer, [employer]. The patient received 9 cc of Gadavist. After 10 minutes inversion recovery sequences were used to assess for infiltration and scar tissue. FINDINGS: Limited images of the lung fields showed no gross abnormalities. Small pericardial effusion. Normal left ventricular size and wall thickness. Vigorous systolic function and wall motion, EF 75%. Visually, the right ventricle looks moderately dilated, but it measures mildly dilated. Low normal RV systolic function  with EF 50%. Mildly dilated left atrium, moderately dilated right atrium. Interatrial septum shifts left suggestive of RA pressure overload. Trileaflet aortic valve with trivial regurgitation (regurgitant fraction 5%) and no significant stenosis. Mild mitral regurgitation, regurgitant fraction 11%. Moderate tricuspid regurgitation, regurgitant fraction 26%. Delayed enhancement images were technically difficult. On delayed enhancement imaging, there was mid-wall late gadolinium enhancement (LGE) in the basal anteroseptal and inferoseptal wall segments and the mid inferoseptal wall segment. There is patchy LGE in the basal to mid inferolateral wall. MEASUREMENTS: MEASUREMENTS LVEDV 128 mL LVEDVi 52 mL/m2 LVSV 97 mL LVEF 75% RVEDV 234 mL RVEDVi 95 mL/m2 RVSV 117 mL RVEF 50% Aortic forward volume 86 mL Aortic regurgitant fraction 5% Pulmonary forward volume 87 mL Pulmonary regurgitant fraction 0% T1 1098, ECV 38% IMPRESSION: 1. Normal LV size with vigorous systolic function, EF 75%. No wall motion abnormalities. 2. Visually, the RV appears moderately dilated but it measures mildly dilated. Low normal systolic function, EF 50%. 3.  Moderate TR with regurgitant fraction 26%. 4. Difficult LGE images, but there appears to be a noncoronary-type mid-wall LGE pattern involving the basal anteroseptal, basal to mid inferoseptal, and basal to mid inferolateral wall segments. This suggests prior myocarditis versus possible cardiac sarcoidosis and merits further workup. 5. Elevated extracellular volume percentage suggesting increased myocardial fibrotic content. Not quite to the level that suggests cardiac amyloidosis. Dalton Mclean Electronically Signed   By: Marca Ancona M.D.   On: 09/14/2022 13:54   MR CARDIAC VELOCITY FLOW MAP  Result Date: 09/14/2022 CLINICAL DATA:  RV failure EXAM: CARDIAC MRI TECHNIQUE: The patient was scanned on a 1.5 Tesla GE magnet. A dedicated cardiac coil was used. Functional imaging was done using  Fiesta sequences. 2,3, and 4 chamber views were done to assess for RWMA's. Modified Simpson's rule using a short axis stack was used to calculate an ejection fraction on a dedicated work [employer] officer, [employer]. The patient received 9 cc of Gadavist. After 10 minutes inversion recovery sequences were used to assess for infiltration and scar tissue. FINDINGS: Limited images of the lung fields showed no gross abnormalities. Small pericardial effusion. Normal left ventricular size and wall thickness. Vigorous systolic function and wall motion, EF 75%. Visually, the right ventricle looks moderately dilated, but it measures mildly dilated. Low normal RV systolic function with EF 50%. Mildly dilated left atrium, moderately dilated right atrium. Interatrial septum shifts left suggestive of RA pressure overload. Trileaflet aortic valve with trivial regurgitation (regurgitant fraction 5%) and no significant stenosis. Mild mitral regurgitation, regurgitant fraction 11%. Moderate tricuspid regurgitation, regurgitant fraction 26%. Delayed enhancement images were technically difficult. On delayed enhancement imaging, there was mid-wall late gadolinium enhancement (LGE) in the basal anteroseptal and inferoseptal wall segments and the mid inferoseptal wall segment. There is patchy LGE in the basal to mid inferolateral wall. MEASUREMENTS: MEASUREMENTS LVEDV 128 mL LVEDVi 52 mL/m2 LVSV 97 mL LVEF 75% RVEDV 234 mL RVEDVi 95 mL/m2 RVSV 117 mL RVEF 50% Aortic forward volume 86 mL Aortic regurgitant fraction 5% Pulmonary forward volume 87 mL Pulmonary regurgitant fraction 0%  T1 1098, ECV 38% IMPRESSION: 1. Normal LV size with vigorous systolic function, EF 75%. No wall motion abnormalities. 2. Visually, the RV appears moderately dilated but it measures mildly dilated. Low normal systolic function, EF 50%. 3.  Moderate TR with regurgitant fraction 26%. 4. Difficult LGE images, but there appears to be a noncoronary-type mid-wall LGE  pattern involving the basal anteroseptal, basal to mid inferoseptal, and basal to mid inferolateral wall segments. This suggests prior myocarditis versus possible cardiac sarcoidosis and merits further workup. 5. Elevated extracellular volume percentage suggesting increased myocardial fibrotic content. Not quite to the level that suggests cardiac amyloidosis. Dalton Mclean Electronically Signed   By: Marca Ancona M.D.   On: 09/14/2022 13:54   MR CARDIAC MORPHOLOGY W WO CONTRAST  Result Date: 09/14/2022 CLINICAL DATA:  RV failure EXAM: CARDIAC MRI TECHNIQUE: The patient was scanned on a 1.5 Tesla GE magnet. A dedicated cardiac coil was used. Functional imaging was done using Fiesta sequences. 2,3, and 4 chamber views were done to assess for RWMA's. Modified Simpson's rule using a short axis stack was used to calculate an ejection fraction on a dedicated work [employer]. The patient received 9 cc of Gadavist. After 10 minutes inversion recovery sequences were used to assess for infiltration and scar tissue. FINDINGS: Limited images of the lung fields showed no gross abnormalities. Small pericardial effusion. Normal left ventricular size and wall thickness. Vigorous systolic function and wall motion, EF 75%. Visually, the right ventricle looks moderately dilated, but it measures mildly dilated. Low normal RV systolic function with EF 50%. Mildly dilated left atrium, moderately dilated right atrium. Interatrial septum shifts left suggestive of RA pressure overload. Trileaflet aortic valve with trivial regurgitation (regurgitant fraction 5%) and no significant stenosis. Mild mitral regurgitation, regurgitant fraction 11%. Moderate tricuspid regurgitation, regurgitant fraction 26%. Delayed enhancement images were technically difficult. On delayed enhancement imaging, there was mid-wall late gadolinium enhancement (LGE) in the basal anteroseptal and inferoseptal wall segments and the mid  inferoseptal wall segment. There is patchy LGE in the basal to mid inferolateral wall. MEASUREMENTS: MEASUREMENTS LVEDV 128 mL LVEDVi 52 mL/m2 LVSV 97 mL LVEF 75% RVEDV 234 mL RVEDVi 95 mL/m2 RVSV 117 mL RVEF 50% Aortic forward volume 86 mL Aortic regurgitant fraction 5% Pulmonary forward volume 87 mL Pulmonary regurgitant fraction 0% T1 1098, ECV 38% IMPRESSION: 1. Normal LV size with vigorous systolic function, EF 75%. No wall motion abnormalities. 2. Visually, the RV appears moderately dilated but it measures mildly dilated. Low normal systolic function, EF 50%. 3.  Moderate TR with regurgitant fraction 26%. 4. Difficult LGE images, but there appears to be a noncoronary-type mid-wall LGE pattern involving the basal anteroseptal, basal to mid inferoseptal, and basal to mid inferolateral wall segments. This suggests prior myocarditis versus possible cardiac sarcoidosis and merits further workup. 5. Elevated extracellular volume percentage suggesting increased myocardial fibrotic content. Not quite to the level that suggests cardiac amyloidosis. Dalton Mclean Electronically Signed   By: Marca Ancona M.D.   On: 09/14/2022 13:53   ECHOCARDIOGRAM LIMITED  Result Date: 09/08/2022    ECHOCARDIOGRAM LIMITED REPORT   Patient Name:   ANDRIEL LEONHART Date of Exam: 09/08/2022 Medical Rec #:  [id number]              Height:       65.0 in Accession #:    [id number]             Weight:       291.0 lb  Date of Birth:  14-Sep-1955               BSA:          2.320 m Patient Age:    67 years               BP:           126/62 mmHg Patient Gender: F                      HR:           67 bpm. [facility] Procedure: Limited Echo, Cardiac Doppler and Limited Color Doppler Indications:    Congestive Heart Failure I50.9  History:        Patient has prior history of Echocardiogram examinations, most                 recent 07/25/2022. CHF, CAD and Previous Myocardial Infarction,                 Pulmonary HTN,  Signs/Symptoms:Shortness of Breath; Risk                 Factors:Hypertension, Dyslipidemia, Diabetes and Non-Smoker.  Sonographer:    Aron Baba Referring Phys: [id number] ASIA B ZIERLE-GHOSH  Sonographer Comments: Image acquisition challenging due to patient body habitus and Image acquisition challenging due to respiratory motion. IMPRESSIONS  1. Limited study.  2. Left ventricular ejection fraction, by estimation, is >75%. The left ventricle has hyperdynamic function. The left ventricle has no regional wall motion abnormalities. There is the interventricular septum is flattened in systole and diastole, consistent with right ventricular pressure and volume overload.  3. Right ventricular systolic function is moderately reduced. The right ventricular size is severely enlarged. There is moderately elevated pulmonary artery systolic pressure. The estimated right ventricular systolic pressure is 45.9 mmHg.  4. The mitral valve is degenerative. Trivial mitral valve regurgitation. Moderate mitral annular calcification.  5. Tricuspid valve regurgitation is mild to moderate.  6. The aortic valve is tricuspid. There is mild calcification of the aortic valve. Aortic valve regurgitation is not visualized.  7. The inferior vena cava is dilated in size with <50% respiratory variability, suggesting right atrial pressure of 15 mmHg. Comparison(s): Prior images reviewed side by side. LVEF is hyperdynamic at >75%. RV dysfunction persists with evidence of moderate pulmonary hypertension. FINDINGS  Left Ventricle: Left ventricular ejection fraction, by estimation, is >75%. The left ventricle has hyperdynamic function. The left ventricle has no regional wall motion abnormalities. The interventricular septum is flattened in systole and diastole, consistent with right ventricular pressure and volume overload. Right Ventricle: The right ventricular size is severely enlarged. No increase in right ventricular wall thickness. Right  ventricular systolic function is moderately reduced. There is moderately elevated pulmonary artery systolic pressure. The tricuspid regurgitant velocity is 2.78 m/s, and with an assumed right atrial pressure of 15 mmHg, the estimated right ventricular systolic pressure is 45.9 mmHg. Pericardium: There is no evidence of pericardial effusion. Mitral Valve: The mitral valve is degenerative in appearance. There is mild thickening of the mitral valve leaflet(s). There is mild calcification of the mitral valve leaflet(s). Moderate mitral annular calcification. Trivial mitral valve regurgitation. Tricuspid Valve: The tricuspid valve is grossly normal. Tricuspid valve regurgitation is mild to moderate. Aortic Valve: The aortic valve is tricuspid. There is mild calcification of the aortic valve. There is mild aortic valve annular calcification. Aortic valve regurgitation is not visualized. Pulmonic Valve:  The pulmonic valve was not well visualized. Pulmonic valve regurgitation is trivial. Venous: The inferior vena cava is dilated in size with less than 50% respiratory variability, suggesting right atrial pressure of 15 mmHg. Additional Comments: Spectral Doppler performed. Color Doppler performed.  LEFT VENTRICLE PLAX 2D LVIDd:         3.80 cm LVIDs:         1.50 cm LV PW:         2.10 cm LV IVS:        0.70 cm  LEFT ATRIUM         Index LA diam:    3.50 cm 1.51 cm/m  TRICUSPID VALVE TR Peak grad:   30.9 mmHg TR Vmax:        278.00 cm/s Nona Dell MD Electronically signed by Nona Dell MD Signature Date/Time: 09/08/2022/2:18:57 PM    Final    DG CHEST PORT 1 VIEW  Result Date: 09/08/2022 CLINICAL DATA:  Dyspnea. EXAM: PORTABLE CHEST 1 VIEW COMPARISON:  02/24/2022 FINDINGS: The cardio pericardial silhouette is enlarged. There is pulmonary vascular congestion. Component of mild interstitial edema not excluded. No focal consolidation or substantial pleural effusion. IMPRESSION: Enlargement of the cardiopericardial  silhouette with pulmonary vascular congestion and possible mild interstitial edema. 9 mm right upper lobe pulmonary nodule seen on chest CT 03/02/2022 not discernible on this chest x-ray. Electronically Signed   By: Kennith Center M.D.   On: 09/08/2022 05:11    Microbiology: Recent Results (from the past 240 hour(s))  MRSA Next Gen by PCR, Nasal     Status: None   Collection Time: 09/29/22 10:35 PM   Specimen: Nasal Mucosa; Nasal Swab  Result Value Ref Range Status   MRSA by PCR Next Gen NOT DETECTED NOT DETECTED Final    Comment: (NOTE) The GeneXpert MRSA Assay (FDA approved for NASAL specimens only), is one component of a comprehensive MRSA colonization surveillance program. It is not intended to diagnose MRSA infection nor to guide or monitor treatment for MRSA infections. Test performance is not FDA approved in patients less than 55 years old. Performed at [facility], [address] [place], [place] [address]      Labs: Basic Metabolic Panel: Recent Labs  Lab 10/03/22 0415 10/04/22 0405 10/05/22 0530 10/06/22 0500 10/07/22 0535  NA 129* 129* 132* 131* 133*  K 3.8 3.8 3.9 3.9 3.2*  CL 87* 88* 88* 88* 91*  CO2 30 29 31 31 30   GLUCOSE 218* 213* 161* 143* 103*  BUN 46* 45* 44* 43* 42*  CREATININE 2.22* 2.21* 2.38* 2.57* 2.61*  CALCIUM 9.1 9.2 9.5 9.6 9.1  MG 2.2 2.2 2.1 2.1 2.0  PHOS  --   --   --   --  4.2   Liver Function Tests: Recent Labs  Lab 10/07/22 0535  ALBUMIN 3.5   No results for input(s): "LIPASE", "AMYLASE" in the last 168 hours. No results for input(s): "AMMONIA" in the last 168 hours. CBC: Recent Labs  Lab 10/03/22 0415 10/04/22 0405 10/05/22 0530 10/06/22 0500 10/07/22 0535  WBC 6.3 6.2 5.2 5.1 5.1  HGB 10.2* 10.6* 10.9* 11.2* 11.1*  HCT 33.2* 33.3* 33.9* 35.5* 34.2*  MCV 100.9* 98.5 98.3 97.8 96.9  PLT 152 156 155 154 159   Cardiac Enzymes: No results for input(s): "CKTOTAL", "CKMB", "CKMBINDEX", "TROPONINI" in the last 168  hours. BNP: BNP (last 3 results) Recent Labs    09/06/22 1611 09/20/22 1105 09/26/22 1318  BNP 1,807.9* 2,885.7* 2,617.1*  ProBNP (last 3 results) No results for input(s): "PROBNP" in the last 8760 hours.  CBG: Recent Labs  Lab 10/06/22 1134 10/06/22 1601 10/06/22 2110 10/07/22 0644 10/07/22 1115  GLUCAP 167* 206* 189* 111* 183*       Signed:  Zannie Cove MD.  [facility] 10/07/2022, 1:21 PM

## 2022-10-07 NOTE — Progress Notes (Signed)
Patient ID: Sandra Schneider, female   DOB: 1956-03-23, 67 y.o.   MRN: [id number]     Advanced Heart Failure Rounding Note  PCP-Cardiologist: Dina Rich, MD   Subjective:   Admitted with marked volume overload and new atrial flutter. 5/15 had syncopal episode. Heart rate 50. Unresponsive. Had brief CPR.  5/16 RHC markedly elevated RH filling pressures w/ low but not markedly low PAPi, mildly elevated PCWP, severe PAH and low CO w/ CI of 1.73 =>started on milrinone and Lasix gtt  5/17 TEE-guided DCCV to NSR but atrial flutter recurred later that day.  5/21 Milrinone decreased to 0.125. Sildenafil increased to 40 TID 5/23 Milrinone stopped. S/P DC-CV -->SR.    Remains in NSR on po amio  Off lasix x 2 days. Scr stable.   Denies CP or SOB  Objective:   Weight Range: 113.4 kg Body mass index is 41.6 kg/m.   Vital Signs:   Temp:  [97.6 F (36.4 C)-98 F (36.7 C)] 97.6 F (36.4 C) (05/25 0737) Pulse Rate:  [60-67] 67 (05/25 0737) Resp:  [16-22] 16 (05/25 0737) BP: (134-160)/(45-88) 134/88 (05/25 0737) SpO2:  [90 %-100 %] 90 % (05/25 0737) Weight:  [113.4 kg] 113.4 kg (05/25 0630) Last BM Date : 10/06/22  Weight change: Filed Weights   10/05/22 0540 10/06/22 0144 10/07/22 0630  Weight: 116.2 kg 114.7 kg 113.4 kg    Intake/Output:   Intake/Output Summary (Last 24 hours) at 10/07/2022 1142 Last data filed at 10/07/2022 0740 Gross per 24 hour  Intake 558.04 ml  Output 600 ml  Net -41.96 ml      Physical Exam    General: Sitting up on side of bed No resp difficulty HEENT: normal Neck: supple. JVP 9 Carotids 2+ bilat; no bruits. No lymphadenopathy or thryomegaly appreciated. Cor: PMI nondisplaced. Regular rate & rhythm. 2/6 TR Lungs: clear Abdomen: obese soft, nontender, nondistended. No hepatosplenomegaly. No bruits or masses. Good bowel sounds. Extremities: no cyanosis, clubbing, rash, edema Neuro: alert & orientedx3, cranial nerves grossly intact. moves  all 4 extremities w/o difficulty. Affect pleasan    Telemetry   SR 60-70s  Personally reviewed  Labs    CBC Recent Labs    10/06/22 0500 10/07/22 0535  WBC 5.1 5.1  HGB 11.2* 11.1*  HCT 35.5* 34.2*  MCV 97.8 96.9  PLT 154 159    Basic Metabolic Panel Recent Labs    65/78/46 0500 10/07/22 0535  NA 131* 133*  K 3.9 3.2*  CL 88* 91*  CO2 31 30  GLUCOSE 143* 103*  BUN 43* 42*  CREATININE 2.57* 2.61*  CALCIUM 9.6 9.1  MG 2.1 2.0  PHOS  --  4.2    Liver Function Tests Recent Labs    10/07/22 0535  ALBUMIN 3.5   No results for input(s): "LIPASE", "AMYLASE" in the last 72 hours. Cardiac Enzymes No results for input(s): "CKTOTAL", "CKMB", "CKMBINDEX", "TROPONINI" in the last 72 hours.   BNP: BNP (last 3 results) Recent Labs    09/06/22 1611 09/20/22 1105 09/26/22 1318  BNP 1,807.9* 2,885.7* 2,617.1*     ProBNP (last 3 results) No results for input(s): "PROBNP" in the last 8760 hours.   D-Dimer No results for input(s): "DDIMER" in the last 72 hours. Hemoglobin A1C No results for input(s): "HGBA1C" in the last 72 hours. Fasting Lipid Panel No results for input(s): "CHOL", "HDL", "LDLCALC", "TRIG", "CHOLHDL", "LDLDIRECT" in the last 72 hours. Thyroid Function Tests No results for input(s): "TSH", "T4TOTAL", "T3FREE", "THYROIDAB" in  the last 72 hours.  Invalid input(s): "FREET3"   Other results:   Imaging    No results found.   Medications:     Scheduled Medications:  amiodarone  200 mg Oral BID   apixaban  5 mg Oral BID   atorvastatin  40 mg Oral Daily   Chlorhexidine Gluconate Cloth  6 each Topical Daily   dorzolamide-timolol  1 drop Both Eyes BID   famotidine  20 mg Oral Daily   insulin aspart  0-5 Units Subcutaneous QHS   insulin aspart  0-9 Units Subcutaneous TID WC   insulin glargine-yfgn  10 Units Subcutaneous QHS   loratadine  10 mg Oral Daily   polyethylene glycol  17 g Oral Daily   potassium chloride  40 mEq Oral BID    sildenafil  40 mg Oral TID   sodium chloride flush  10-40 mL Intracatheter Q12H   sodium chloride flush  3 mL Intravenous Q12H   sodium chloride flush  3 mL Intravenous Q12H    Infusions:    PRN Medications: acetaminophen **OR** acetaminophen, benzonatate, hydrALAZINE, loperamide, ondansetron **OR** ondansetron (ZOFRAN) IV, mouth rinse, sodium chloride flush    Patient Profile     Sandra Schneider is a 67 year old with a history of CAD (NSTEMI 06/2012 w/ PCI to LAD and Lcx) LVEF 55% at that time, OSA, HTN, hyperlipidemia, hx of breast ca s/p lumpectomy and HFpEF.    Admitted with A/C HFpEF--RV Failure and new onset Atrial Flutter.   Assessment/Plan   1. RV failure/pulmonary hypertension:   - RHC (9/23) with RA 30, RV 107/20-30, PA 107/48 (69), unable to wedge; LVEDP 15. AO 140/64. LVEDP ~16. Assuming a PCWP of 20, TPG would be 49 w/ PVR of 7.  Echo 3/24 with RV severely enlarged w/ severely reduced RV function.  Cardiac MRI in 5/24 with EF 75%, moderate RV dilation with RV EF 50%, moderate TR, mid-wall basal septal/basal inferolateral LGE.  Could be seen with cardiac sarcoidosis (vs prior myocarditis).  CT chest showed RUL nodule (negative on PET) and mediastinal LAN.  V/Q scan not suggestive of chronic PEs.  RHC 5/16 showed severe PAH with low CI 1.73, PAPi 1.5, RA pressure 32 (R>>L heart failure). TEE this admission with LV EF 60-65%, D-shaped septum, moderate RV dilation with moderate RV dysfunction, mod-severe TR.  Likely end-stage PH/cor pulmonale d/t OHS/OSA and group 1 PAH. - Co-ox stable off milrinone.  -Overall diuresed 35 pounds.  -Would restart torsemide 40 bid on Monday - Continue sildenafil 40 TID  - Consider ERA as outpatient - Sent autoimmune serologies => RF elevated, ANA/anti-centromere/SCL-70 negative.  - Needs eventual cardiac PET to assess for cardiac sarcoidosis.   - Moderate OSA on recent sleep study with severe O2 desats (low 70s), now using CPAP at night.  2.  Syncope:  - Suspect this was due to severe PH/RV failure.  No arrhythmias noted on telemetry.   3. Atrial flutter:  - New diagnosis.  V-rates mildly elevated low 100s.  TEE-guided DCCV to NSR 05/17 but AFL recurred.  Amiodarone gtt started for rate control and to keep her in NSR after repeat DCCV.   - 5/23 S/P DCCV --> SR  - Stop IV amio and will transition to amio 200 mg twice a day  - Continue apixaban.  4. CAD:  - Remote PCI LAD and LCx.  - Continue statin.  - Stopped ASA with anticoagulation.  - No s/s angina  5. CKD stage 3:  - Creatinine  baseline previously ~1.4 - Creatinine up slightly today, 2.21>2.38>2.67 -> 2.61  - Restart torsemide Monday  6. Hypokalemia/hypomagnesemia: - Replaced as needed.   7. Obesity: - BMI 43 - Referred to outpatient PharmD for GLP1RA to assist with weight loss  8. OSA - has CPAP equipment being shipped to her house - encouraged her to use  [facility] for d/c today on current meds. Start torsemide 40 bid on Monday. D/w Dr. Jomarie Longs ([facility])   Arvilla Meres MD 10/07/2022 11:42 AM

## 2022-10-10 ENCOUNTER — Telehealth (HOSPITAL_COMMUNITY): Payer: Self-pay

## 2022-10-10 ENCOUNTER — Telehealth: Payer: Self-pay | Admitting: Internal Medicine

## 2022-10-10 DIAGNOSIS — E1122 Type 2 diabetes mellitus with diabetic chronic kidney disease: Secondary | ICD-10-CM | POA: Diagnosis not present

## 2022-10-10 DIAGNOSIS — Z7901 Long term (current) use of anticoagulants: Secondary | ICD-10-CM | POA: Diagnosis not present

## 2022-10-10 DIAGNOSIS — E785 Hyperlipidemia, unspecified: Secondary | ICD-10-CM | POA: Diagnosis not present

## 2022-10-10 DIAGNOSIS — G4733 Obstructive sleep apnea (adult) (pediatric): Secondary | ICD-10-CM | POA: Diagnosis not present

## 2022-10-10 DIAGNOSIS — E1165 Type 2 diabetes mellitus with hyperglycemia: Secondary | ICD-10-CM | POA: Diagnosis not present

## 2022-10-10 DIAGNOSIS — I5033 Acute on chronic diastolic (congestive) heart failure: Secondary | ICD-10-CM | POA: Diagnosis not present

## 2022-10-10 DIAGNOSIS — I4819 Other persistent atrial fibrillation: Secondary | ICD-10-CM | POA: Diagnosis not present

## 2022-10-10 DIAGNOSIS — C50411 Malignant neoplasm of upper-outer quadrant of right female breast: Secondary | ICD-10-CM | POA: Diagnosis not present

## 2022-10-10 DIAGNOSIS — D630 Anemia in neoplastic disease: Secondary | ICD-10-CM | POA: Diagnosis not present

## 2022-10-10 DIAGNOSIS — N179 Acute kidney failure, unspecified: Secondary | ICD-10-CM | POA: Diagnosis not present

## 2022-10-10 DIAGNOSIS — D631 Anemia in chronic kidney disease: Secondary | ICD-10-CM | POA: Diagnosis not present

## 2022-10-10 DIAGNOSIS — I4892 Unspecified atrial flutter: Secondary | ICD-10-CM | POA: Diagnosis not present

## 2022-10-10 DIAGNOSIS — R911 Solitary pulmonary nodule: Secondary | ICD-10-CM | POA: Diagnosis not present

## 2022-10-10 DIAGNOSIS — I50813 Acute on chronic right heart failure: Secondary | ICD-10-CM | POA: Diagnosis not present

## 2022-10-10 DIAGNOSIS — I272 Pulmonary hypertension, unspecified: Secondary | ICD-10-CM | POA: Diagnosis not present

## 2022-10-10 DIAGNOSIS — H409 Unspecified glaucoma: Secondary | ICD-10-CM | POA: Diagnosis not present

## 2022-10-10 DIAGNOSIS — I2510 Atherosclerotic heart disease of native coronary artery without angina pectoris: Secondary | ICD-10-CM | POA: Diagnosis not present

## 2022-10-10 DIAGNOSIS — N184 Chronic kidney disease, stage 4 (severe): Secondary | ICD-10-CM | POA: Diagnosis not present

## 2022-10-10 DIAGNOSIS — K219 Gastro-esophageal reflux disease without esophagitis: Secondary | ICD-10-CM | POA: Diagnosis not present

## 2022-10-10 DIAGNOSIS — Z9981 Dependence on supplemental oxygen: Secondary | ICD-10-CM | POA: Diagnosis not present

## 2022-10-10 DIAGNOSIS — I252 Old myocardial infarction: Secondary | ICD-10-CM | POA: Diagnosis not present

## 2022-10-10 DIAGNOSIS — I130 Hypertensive heart and chronic kidney disease with heart failure and stage 1 through stage 4 chronic kidney disease, or unspecified chronic kidney disease: Secondary | ICD-10-CM | POA: Diagnosis not present

## 2022-10-10 NOTE — Telephone Encounter (Signed)
Pt calling in bc she was given a oxygen tank and a CPAP machine but the CPAP machine face mask is too small for PT

## 2022-10-10 NOTE — Telephone Encounter (Signed)
She called and wants to know if ok for her to drive after procedure. Can she do her normal activities?

## 2022-10-11 NOTE — Telephone Encounter (Signed)
I spoke to [place] and updated her.

## 2022-10-12 DIAGNOSIS — Z9981 Dependence on supplemental oxygen: Secondary | ICD-10-CM | POA: Diagnosis not present

## 2022-10-12 DIAGNOSIS — D630 Anemia in neoplastic disease: Secondary | ICD-10-CM | POA: Diagnosis not present

## 2022-10-12 DIAGNOSIS — E113311 Type 2 diabetes mellitus with moderate nonproliferative diabetic retinopathy with macular edema, right eye: Secondary | ICD-10-CM | POA: Diagnosis not present

## 2022-10-12 DIAGNOSIS — C50411 Malignant neoplasm of upper-outer quadrant of right female breast: Secondary | ICD-10-CM | POA: Diagnosis not present

## 2022-10-12 DIAGNOSIS — E1122 Type 2 diabetes mellitus with diabetic chronic kidney disease: Secondary | ICD-10-CM | POA: Diagnosis not present

## 2022-10-12 DIAGNOSIS — N179 Acute kidney failure, unspecified: Secondary | ICD-10-CM | POA: Diagnosis not present

## 2022-10-12 DIAGNOSIS — E113392 Type 2 diabetes mellitus with moderate nonproliferative diabetic retinopathy without macular edema, left eye: Secondary | ICD-10-CM | POA: Diagnosis not present

## 2022-10-12 DIAGNOSIS — G4733 Obstructive sleep apnea (adult) (pediatric): Secondary | ICD-10-CM | POA: Diagnosis not present

## 2022-10-12 DIAGNOSIS — K219 Gastro-esophageal reflux disease without esophagitis: Secondary | ICD-10-CM | POA: Diagnosis not present

## 2022-10-12 DIAGNOSIS — H35033 Hypertensive retinopathy, bilateral: Secondary | ICD-10-CM | POA: Diagnosis not present

## 2022-10-12 DIAGNOSIS — I50813 Acute on chronic right heart failure: Secondary | ICD-10-CM | POA: Diagnosis not present

## 2022-10-12 DIAGNOSIS — E785 Hyperlipidemia, unspecified: Secondary | ICD-10-CM | POA: Diagnosis not present

## 2022-10-12 DIAGNOSIS — N184 Chronic kidney disease, stage 4 (severe): Secondary | ICD-10-CM | POA: Diagnosis not present

## 2022-10-12 DIAGNOSIS — I5033 Acute on chronic diastolic (congestive) heart failure: Secondary | ICD-10-CM | POA: Diagnosis not present

## 2022-10-12 DIAGNOSIS — I130 Hypertensive heart and chronic kidney disease with heart failure and stage 1 through stage 4 chronic kidney disease, or unspecified chronic kidney disease: Secondary | ICD-10-CM | POA: Diagnosis not present

## 2022-10-12 DIAGNOSIS — R911 Solitary pulmonary nodule: Secondary | ICD-10-CM | POA: Diagnosis not present

## 2022-10-12 DIAGNOSIS — D631 Anemia in chronic kidney disease: Secondary | ICD-10-CM | POA: Diagnosis not present

## 2022-10-12 DIAGNOSIS — I4892 Unspecified atrial flutter: Secondary | ICD-10-CM | POA: Diagnosis not present

## 2022-10-12 DIAGNOSIS — H35371 Puckering of macula, right eye: Secondary | ICD-10-CM | POA: Diagnosis not present

## 2022-10-12 DIAGNOSIS — Z7901 Long term (current) use of anticoagulants: Secondary | ICD-10-CM | POA: Diagnosis not present

## 2022-10-12 DIAGNOSIS — I252 Old myocardial infarction: Secondary | ICD-10-CM | POA: Diagnosis not present

## 2022-10-12 DIAGNOSIS — I272 Pulmonary hypertension, unspecified: Secondary | ICD-10-CM | POA: Diagnosis not present

## 2022-10-12 DIAGNOSIS — H409 Unspecified glaucoma: Secondary | ICD-10-CM | POA: Diagnosis not present

## 2022-10-12 DIAGNOSIS — I4819 Other persistent atrial fibrillation: Secondary | ICD-10-CM | POA: Diagnosis not present

## 2022-10-12 DIAGNOSIS — E1165 Type 2 diabetes mellitus with hyperglycemia: Secondary | ICD-10-CM | POA: Diagnosis not present

## 2022-10-12 DIAGNOSIS — I2510 Atherosclerotic heart disease of native coronary artery without angina pectoris: Secondary | ICD-10-CM | POA: Diagnosis not present

## 2022-10-13 ENCOUNTER — Telehealth: Payer: Self-pay | Admitting: *Deleted

## 2022-10-13 DIAGNOSIS — G4733 Obstructive sleep apnea (adult) (pediatric): Secondary | ICD-10-CM

## 2022-10-13 NOTE — Telephone Encounter (Signed)
Pt hasn't received any CPAP equipment from DME company. Please follow up with DME company [facility])  to see what exactly do they need to send out order.

## 2022-10-13 NOTE — Telephone Encounter (Addendum)
TOC CM contacted pt to follow up on DME, and CPAP. States she has not received CPAP machine from [employer]. States she has called the Pulm office to follow up. She received her oxygen and 3n1 from [employer]. Contacted [employer] rep, Jermaine to follow up on Rollator for home. States her HH PT has been coming out to the home. Contacted Vinie Sill NP to follow up on CPAP. Spoke to triage nurse states she will send message back to office coordinator to review and follow up.    Isidoro Donning RN3 CCM, Heart Failure TOC CM [phone]

## 2022-10-13 NOTE — Telephone Encounter (Signed)
I called and spoke with [place] with [employer]. Patient spoke with Lurena Joiner with [facility] on 07/24/22 and 08/03/22 patient stated that she has HST done 07/19/22 but needed to do 2nd sleep study. Patient wanted to wait until 2nd sleep study was done so Rebecca CXL the order.  Cpap Titration Study was done 09/18/22 but no notified [employer].  Per Archie Patten they will need new Cpap order since patient had another sleep study

## 2022-10-13 NOTE — Telephone Encounter (Signed)
Spoke with patient regarding prior message . Advised patient she will need to contact [employer] to see where the face mask is that she never received.   Patient's voice was understanding.Nothing else further needed.

## 2022-10-16 ENCOUNTER — Telehealth: Payer: Self-pay | Admitting: Internal Medicine

## 2022-10-16 DIAGNOSIS — G4733 Obstructive sleep apnea (adult) (pediatric): Secondary | ICD-10-CM

## 2022-10-16 NOTE — Telephone Encounter (Signed)
Routing to Dr. Vassie Loll and [facility] clincial pool so we can find out settings that is needed for the order since pt had another sleep study.

## 2022-10-16 NOTE — Telephone Encounter (Signed)
Patient called stating [employer] did not have her in the system for CPAP order. I do not see where CPAP order was sent. I also called [employer] and they did not have a record of the patient. Patient is needing a new mask for her CPAP machine. Please follow up.

## 2022-10-16 NOTE — Telephone Encounter (Signed)
Order has been put in.  Nothing further needed.

## 2022-10-17 DIAGNOSIS — D631 Anemia in chronic kidney disease: Secondary | ICD-10-CM | POA: Diagnosis not present

## 2022-10-17 DIAGNOSIS — I5033 Acute on chronic diastolic (congestive) heart failure: Secondary | ICD-10-CM | POA: Diagnosis not present

## 2022-10-17 DIAGNOSIS — Z7901 Long term (current) use of anticoagulants: Secondary | ICD-10-CM | POA: Diagnosis not present

## 2022-10-17 DIAGNOSIS — I252 Old myocardial infarction: Secondary | ICD-10-CM | POA: Diagnosis not present

## 2022-10-17 DIAGNOSIS — I130 Hypertensive heart and chronic kidney disease with heart failure and stage 1 through stage 4 chronic kidney disease, or unspecified chronic kidney disease: Secondary | ICD-10-CM | POA: Diagnosis not present

## 2022-10-17 DIAGNOSIS — H409 Unspecified glaucoma: Secondary | ICD-10-CM | POA: Diagnosis not present

## 2022-10-17 DIAGNOSIS — D630 Anemia in neoplastic disease: Secondary | ICD-10-CM | POA: Diagnosis not present

## 2022-10-17 DIAGNOSIS — N184 Chronic kidney disease, stage 4 (severe): Secondary | ICD-10-CM | POA: Diagnosis not present

## 2022-10-17 DIAGNOSIS — N179 Acute kidney failure, unspecified: Secondary | ICD-10-CM | POA: Diagnosis not present

## 2022-10-17 DIAGNOSIS — Z713 Dietary counseling and surveillance: Secondary | ICD-10-CM | POA: Diagnosis not present

## 2022-10-17 DIAGNOSIS — E785 Hyperlipidemia, unspecified: Secondary | ICD-10-CM | POA: Diagnosis not present

## 2022-10-17 DIAGNOSIS — Z9981 Dependence on supplemental oxygen: Secondary | ICD-10-CM | POA: Diagnosis not present

## 2022-10-17 DIAGNOSIS — E119 Type 2 diabetes mellitus without complications: Secondary | ICD-10-CM | POA: Diagnosis not present

## 2022-10-17 DIAGNOSIS — I2510 Atherosclerotic heart disease of native coronary artery without angina pectoris: Secondary | ICD-10-CM | POA: Diagnosis not present

## 2022-10-17 DIAGNOSIS — C50411 Malignant neoplasm of upper-outer quadrant of right female breast: Secondary | ICD-10-CM | POA: Diagnosis not present

## 2022-10-17 DIAGNOSIS — E1122 Type 2 diabetes mellitus with diabetic chronic kidney disease: Secondary | ICD-10-CM | POA: Diagnosis not present

## 2022-10-17 DIAGNOSIS — Z7182 Exercise counseling: Secondary | ICD-10-CM | POA: Diagnosis not present

## 2022-10-17 DIAGNOSIS — I509 Heart failure, unspecified: Secondary | ICD-10-CM | POA: Diagnosis not present

## 2022-10-17 DIAGNOSIS — I272 Pulmonary hypertension, unspecified: Secondary | ICD-10-CM | POA: Diagnosis not present

## 2022-10-17 DIAGNOSIS — I4819 Other persistent atrial fibrillation: Secondary | ICD-10-CM | POA: Diagnosis not present

## 2022-10-17 DIAGNOSIS — I50813 Acute on chronic right heart failure: Secondary | ICD-10-CM | POA: Diagnosis not present

## 2022-10-17 DIAGNOSIS — R911 Solitary pulmonary nodule: Secondary | ICD-10-CM | POA: Diagnosis not present

## 2022-10-17 DIAGNOSIS — G4733 Obstructive sleep apnea (adult) (pediatric): Secondary | ICD-10-CM | POA: Diagnosis not present

## 2022-10-17 DIAGNOSIS — E1165 Type 2 diabetes mellitus with hyperglycemia: Secondary | ICD-10-CM | POA: Diagnosis not present

## 2022-10-17 DIAGNOSIS — I4892 Unspecified atrial flutter: Secondary | ICD-10-CM | POA: Diagnosis not present

## 2022-10-17 DIAGNOSIS — K219 Gastro-esophageal reflux disease without esophagitis: Secondary | ICD-10-CM | POA: Diagnosis not present

## 2022-10-17 DIAGNOSIS — R0602 Shortness of breath: Secondary | ICD-10-CM | POA: Diagnosis not present

## 2022-10-18 ENCOUNTER — Other Ambulatory Visit (HOSPITAL_COMMUNITY): Payer: Self-pay

## 2022-10-18 ENCOUNTER — Encounter (HOSPITAL_COMMUNITY): Payer: Self-pay | Admitting: Cardiology

## 2022-10-18 ENCOUNTER — Ambulatory Visit (HOSPITAL_COMMUNITY)
Admit: 2022-10-18 | Discharge: 2022-10-18 | Disposition: A | Discharge status: Home / Self Care | Payer: Medicare Other | Source: Ambulatory Visit | Attending: Cardiology | Admitting: Cardiology

## 2022-10-18 VITALS — BP 150/60 | HR 54 | Wt 242.4 lb

## 2022-10-18 DIAGNOSIS — I252 Old myocardial infarction: Secondary | ICD-10-CM | POA: Diagnosis not present

## 2022-10-18 DIAGNOSIS — I272 Pulmonary hypertension, unspecified: Secondary | ICD-10-CM

## 2022-10-18 DIAGNOSIS — Z79899 Other long term (current) drug therapy: Secondary | ICD-10-CM | POA: Diagnosis not present

## 2022-10-18 DIAGNOSIS — I50810 Right heart failure, unspecified: Secondary | ICD-10-CM

## 2022-10-18 DIAGNOSIS — R0602 Shortness of breath: Secondary | ICD-10-CM | POA: Diagnosis not present

## 2022-10-18 DIAGNOSIS — I2510 Atherosclerotic heart disease of native coronary artery without angina pectoris: Secondary | ICD-10-CM | POA: Diagnosis not present

## 2022-10-18 DIAGNOSIS — G4733 Obstructive sleep apnea (adult) (pediatric): Secondary | ICD-10-CM

## 2022-10-18 DIAGNOSIS — Z955 Presence of coronary angioplasty implant and graft: Secondary | ICD-10-CM | POA: Insufficient documentation

## 2022-10-18 DIAGNOSIS — I10 Essential (primary) hypertension: Secondary | ICD-10-CM | POA: Insufficient documentation

## 2022-10-18 DIAGNOSIS — Z6841 Body Mass Index (BMI) 40.0 and over, adult: Secondary | ICD-10-CM | POA: Insufficient documentation

## 2022-10-18 DIAGNOSIS — R600 Localized edema: Secondary | ICD-10-CM | POA: Diagnosis not present

## 2022-10-18 DIAGNOSIS — Z91148 Patient's other noncompliance with medication regimen for other reason: Secondary | ICD-10-CM | POA: Insufficient documentation

## 2022-10-18 DIAGNOSIS — E785 Hyperlipidemia, unspecified: Secondary | ICD-10-CM | POA: Insufficient documentation

## 2022-10-18 LAB — BRAIN NATRIURETIC PEPTIDE: B Natriuretic Peptide: 608.3 pg/mL — ABNORMAL HIGH (ref 0.0–100.0)

## 2022-10-18 LAB — BASIC METABOLIC PANEL
Anion gap: 11 (ref 5–15)
BUN: 30 mg/dL — ABNORMAL HIGH (ref 8–23)
CO2: 30 mmol/L (ref 22–32)
Calcium: 9.7 mg/dL (ref 8.9–10.3)
Chloride: 94 mmol/L — ABNORMAL LOW (ref 98–111)
Creatinine, Ser: 1.41 mg/dL — ABNORMAL HIGH (ref 0.44–1.00)
GFR, Estimated: 41 mL/min — ABNORMAL LOW (ref >60)
Glucose, Bld: 145 mg/dL — ABNORMAL HIGH (ref 70–99)
Potassium: 3.7 mmol/L (ref 3.5–5.1)
Sodium: 135 mmol/L (ref 135–145)

## 2022-10-18 MED ORDER — AMIODARONE HCL 200 MG PO TABS: 200.0000 mg | ORAL_TABLET | Freq: Every day | ORAL | 5 refills | Status: DC

## 2022-10-18 MED ORDER — AMLODIPINE BESYLATE 5 MG PO TABS: 5.0000 mg | ORAL_TABLET | Freq: Every day | ORAL | 3 refills | Status: DC

## 2022-10-18 NOTE — Patient Instructions (Signed)
START Amlodipine 5 mg daily  Change Amiodarone 200 mg daily.  Take 60 mg of Torsemide Twice daily for 3 days and then go back to normal dose.  Take 60 mg Twice daily 3 days before your procedure, starting on  10/28/22.  Labs done today, your results will be available in MyChart, we will contact you for abnormal readings.  You are scheduled for a Cardiac Catheterization on Tuesday, June 18 with Dr.  Gasper Lloyd .  1. Please arrive at the [facility] (Main Entrance A) at [facility]: [address] [place], [place] [address] at 8:30 AM (This time is 2 hour(s) before your procedure to ensure your preparation). Free valet parking service is available. You will check in at ADMITTING. The support person will be asked to wait in the waiting room.  It is OK to have someone drop you off and come back when you are ready to be discharged.    Special note: Every effort is made to have your procedure done on time. Please understand that emergencies sometimes delay scheduled procedures.  2. Diet: Do not eat solid foods after midnight.  The patient may have clear liquids until 5am upon the day of the procedure.  3. Medication instructions in preparation for your procedure:   Contrast Allergy: No  HOLD  TORSEMIDE AND GLIPIZIDE THE MORNING OF YOUR PROCEDURE  On the morning of your procedure, take any morning medicines NOT listed above.  You may use sips of water.  5. Plan to go home the same day, you will only stay overnight if medically necessary. 6. Bring a current list of your medications and current insurance cards. 7. You MUST have a responsible person to drive you home. 8. Someone MUST be with you the first 24 hours after you arrive home or your discharge will be delayed. 9. Please wear clothes that are easy to get on and off and wear slip-on shoes.  Your physician recommends that you schedule a follow-up appointment in: 1 month  If you have any questions or concerns before your next  appointment please send [place] a message through [place] or call our office at [phone].    TO LEAVE A MESSAGE FOR THE NURSE SELECT OPTION 2, PLEASE LEAVE A MESSAGE INCLUDING: YOUR NAME DATE OF BIRTH CALL BACK NUMBER REASON FOR CALL**this is important as we prioritize the call backs  YOU WILL RECEIVE A CALL BACK THE SAME DAY AS LONG AS YOU CALL BEFORE 4:00 PM  At the [facility], you and your health needs are our priority. As part of our continuing mission to provide you with exceptional heart care, we have created designated Provider Care Teams. These Care Teams include your primary Cardiologist (physician) and Advanced Practice Providers (APPs- Physician Assistants and Nurse Practitioners) who all work together to provide you with the care you need, when you need it.   You may see any of the following providers on your designated Care Team at your next follow up: Dr Arvilla Meres Dr Marca Ancona Dr. Marcos Eke, NP Robbie Lis, [place] [facility] [place], [place] Brynda Peon, NP Karle Plumber, PharmD   Please be sure to bring in all your medications bottles to every appointment.    Thank you for choosing [facility]

## 2022-10-18 NOTE — Progress Notes (Signed)
ReDS Vest / Clip - 10/18/22 1040       ReDS Vest / Clip   Station Marker B    Ruler Value 35    ReDS Value Range Moderate volume overload    ReDS Actual Value 40

## 2022-10-18 NOTE — H&P (View-Only) (Signed)
 ADVANCED HEART FAILURE CLINIC NOTE  Referring Physician: [facility] [place] [facility]  Primary Care: [facility] Primary Cardiologist: Dr. Jonathan Branch  HPI: Solei Brown Caisse is a 67 y.o. female with CAD (NSTEMI 06/2012 w/ PCI to LAD and Lcx) LVEF 55% at that time, OSA, HTN, hyperlipidemia, hx of breast ca s/p lumpectomy and persistent shortness of breath and lower extremity edema presenting today for follow up. She also follows Dr. Alva for OSA.  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  She has noticed that over the past several months she has had worsening lower extremity edema and difficulty ambulating due to shortness of breath.  She had a echocardiogram concerning for pulmonary hypertension followed by right heart cath with a mean PA of 69 mmHg.  Her cardiac output was 7 L/min during that case.  She had a workup for interstitial lung disease by pulmonology with CT chest that was negative.  However there was a right upper lobe lung nodule with negative PET scan.  In addition she has had a colonoscopy and mammogram due to hypermetabolic areas in the colon and breast both of which were also negative.   Since her initial appointment we started her on sildenafil 20 mg 3 times daily.  Unfortunately due to continued dietary indiscretion, significant volume intake and medication noncompliance she presented severely hypervolemic during her last appointment in May 2024.  She was directly admitted to the hospital and diuresed 35 pounds during that admission.  Right heart cath confirmed severe precapillary pulmonary hypertension.  Sildenafil was increased to 40 mg 3 times daily.  Interval hx:  Today Ms. Jaffe reports significant improvement in her symptoms after 35 pounds IV diuresis while in hospital.  She is able to walk longer distances without needing to stop with the assistance of her walker.  She is however starting to gain lower  extremity edema once again, reports compliance with all medications and is attempting to decrease sodium intake.  Activity level/exercise tolerance: NYHA IIIb Orthopnea:  Sleeps on 3 pillows Paroxysmal noctural dyspnea: Yes Chest pain/pressure:  No Orthostatic lightheadedness:  No Palpitations:  No Lower extremity edema:  Yes, 1-2+ pretibial pitting edema Presyncope/syncope:  No Cough:  Infrequent but yes  Past Medical History:  Diagnosis Date   Breast cancer (HCC)    Coronary artery disease    Diabetes mellitus    GERD (gastroesophageal reflux disease)    Hypertension    Myocardial abscess    Myocardial infarct (HCC) 06/24/2012   Pulmonary hypertension (HCC)     Current Outpatient Medications  Medication Sig Dispense Refill   acetaminophen (TYLENOL) 500 MG tablet Take 1,000 mg by mouth daily as needed for moderate pain, fever or headache.     amiodarone (PACERONE) 200 MG tablet Take 1 tablet (200 mg total) by mouth 2 (two) times daily. 60 tablet 5   apixaban (ELIQUIS) 5 MG TABS tablet Take 1 tablet (5 mg total) by mouth 2 (two) times daily. 60 tablet 5   Ascorbic Acid (VITAMIN C PO) Take 1 tablet by mouth daily.     atorvastatin (LIPITOR) 40 MG tablet Take 1 tablet by mouth once daily 90 tablet 0   cetirizine (ZYRTEC) 10 MG tablet Take 10 mg by mouth daily.     Cholecalciferol (VITAMIN D-3 PO) Take 1 capsule by mouth daily.     dorzolamide-timolol (COSOPT) 22.3-6.8 MG/ML ophthalmic solution Place 1 drop into both eyes 2 (two) times daily.     glipiZIDE (  GLUCOTROL XL) 5 MG 24 hr tablet Take 5 mg by mouth daily with breakfast.     letrozole (FEMARA) 2.5 MG tablet Take 2.5 mg by mouth daily.     Multiple Vitamin (MULTIVITAMIN) tablet Take 1 tablet by mouth daily.     potassium chloride (KLOR-CON M) 10 MEQ tablet Take 2 tablets (20 mEq total) by mouth daily. 60 tablet 11   sildenafil (REVATIO) 20 MG tablet Take 2 tablets (40 mg total) by mouth 3 (three) times daily. 180 tablet 5    torsemide (DEMADEX) 20 MG tablet Take 2 tablets (40 mg total) by mouth 2 (two) times daily. 120 tablet 5   Current Facility-Administered Medications  Medication Dose Route Frequency Provider Last Rate Last Admin   sodium chloride flush (NS) 0.9 % injection 3 mL  3 mL Intravenous Q12H Branch, Jonathan F, MD        No Known Allergies    Social History   Socioeconomic History   Marital status: Widowed    Spouse name: Not on file   Number of children: Not on file   Years of education: Not on file   Highest education level: Not on file  Occupational History   Not on file  Tobacco Use   Smoking status: Never    Passive exposure: Never   Smokeless tobacco: Never  Vaping Use   Vaping Use: Never used  Substance and Sexual Activity   Alcohol use: No   Drug use: No   Sexual activity: Yes    Birth control/protection: Post-menopausal  Other Topics Concern   Not on file  Social History Narrative   Not on file   Social Determinants of Health   Financial Resource Strain: Not on file  Food Insecurity: No Food Insecurity (09/27/2022)   Hunger Vital Sign    Worried About Running Out of Food in the Last Year: Never true    Ran Out of Food in the Last Year: Never true  Transportation Needs: No Transportation Needs (09/27/2022)   PRAPARE - Transportation    Lack of Transportation (Medical): No    Lack of Transportation (Non-Medical): No  Physical Activity: Not on file  Stress: Not on file  Social Connections: Not on file  Intimate Partner Violence: Not At Risk (09/27/2022)   Humiliation, Afraid, Rape, and Kick questionnaire    Fear of Current or Ex-Partner: No    Emotionally Abused: No    Physically Abused: No    Sexually Abused: No      Family History  Problem Relation Age of Onset   Heart disease Father    Diabetes Other    Hypertension Other    Cancer Other    Colon cancer Son 22       alive, doing well.    PHYSICAL EXAM: Vitals:   10/18/22 1040  BP: (!) 150/60   Pulse: (!) 54  SpO2: 99%   GENERAL: chronically ill appearing AAF with walker on 4L [place] HEENT: Negative for arcus senilis or xanthelasma. There is no scleral icterus.  The mucous membranes are pink and moist.   NECK: Supple, No masses. Normal carotid upstrokes without bruits. No masses or thyromegaly.    CHEST: There are no chest wall deformities. There is no chest wall tenderness. Respirations are unlabored.  Lungs- decreased b/l, no crackles CARDIAC:  JVP: 10 cm          Normal rate with regular rhythm. No murmurs, rubs or gallops.  Pulses are 2+ and symmetrical in upper   and lower extremities. 1-2+ pretibial edema.  ABDOMEN: Soft, non-tender, non-distended. There are no masses or hepatomegaly. There are normal bowel sounds.  EXTREMITIES: Warm and well perfused with no cyanosis, clubbing.  LYMPHATIC: No axillary or supraclavicular lymphadenopathy.  NEUROLOGIC: Patient is oriented x3 with no focal or lateralizing neurologic deficits.  PSYCH: Patients affect is appropriate, there is no evidence of anxiety or depression.  SKIN: Warm and dry; no lesions or wounds.    DATA REVIEW  ECG:06/13/22: NSR with 1AVB  ECHO: 01/18/22: LVBEF 60-65%. Moderately dilated RV with mild reduction in function. Dilated RA.  CATH: 02/08/23:   Prox Cx to Dist Cx lesion is 25% stenosed.   Mid LAD lesion is 25% stenosed.   2nd Mrg lesion is 100% stenosed.  Right to left collaterals.  Left to left collaterals.   The left ventricular systolic function is normal.   LV end diastolic pressure is mildly elevated.   The left ventricular ejection fraction is greater than 65% by visual estimate.   Hemodynamic findings consistent with severe pulmonary hypertension.   There is no aortic valve stenosis.   Aortic saturation 96% on 3 L nasal cannula, PA saturation 69% on 3 L, mean RA pressure 32 mm Hg; PA pressure 107/48, mean PA pressure 69 mmHg, pulmonary capillary wedge pressure difficult to assess due to significant V  waves, and difficulty wedging.  Cardiac output 7.03 L/min, cardiac index 3.07.   Patent stents with mild restenosis in the LAD and circumflex.  Small obtuse marginal appears occluded distally with some right to left collaterals.  No target for PCI.  RHC 09/28/22: RA 32 RV 95/32 PA 93/44, mean 66 PCWP mean 19  Oxygen saturations: PA 49% AO 96%  Cardiac Output (Fick) 3.98  Cardiac Index (Fick) 1.73 PVR 11.8 PAPi 1.5   PFTS: 03/21/22: - No obstructive lung disease.   ASSESSMENT & PLAN:  Pulmonary hypertension, Group I + III - Initial RHC waveforms reviewed personally. RA 30, RV 107/20-30, PA 107/48 (69), unable to wedge; LVEDP 15. AO 140/64. LVEDP ~16. Assuming a PCWP of 20, TPG would be 49 w/ PVR of 7.  - Hemodynamics consistent with severe pulmonary hypertension largely due to pre-capillary PH.  -Cardiac MRI in 5/24 with EF 75%, moderate RV dilation with RV EF 50%, moderate TR, mid-wall basal septal/basal inferolateral LGE. Could be seen with cardiac sarcoidosis (vs prior myocarditis). CT chest showed RUL nodule (negative on PET) and mediastinal LAN. V/Q scan not suggestive of chronic PEs. RHC 5/16 showed severe PAH with low CI 1.73, PAPi 1.5, RA pressure 32 (R>>L heart failure). TEE this admission with LV EF 60-65%, D-shaped septum, moderate RV dilation with moderate RV dysfunction, mod-severe TR. Likely end-stage PH/cor pulmonale d/t OHS/OSA and group 1 PAH.  - Labs: ANA, HCV negative. HIV pending. LFTs mostly unremarkable.  - PFTs w/ moderate restriction.  - BNP previously elevated.  - CT chest w/ pulmonary nodules, however, follow up PET mostly unremarkable.  - Currently on sildenafil 40mg TID.  - Repeat RHC to evaluate candidacy for ERA.  - Increase torsemide to 60mg BID for x 3 days.   2. OSA - followed by pulmonology.  - reports compliance with bipap  3. CAD - LHC as above - followed by general cardiology.   4. Severe obesity - BMI 48.54 today - discussed importance  of weight loss.    Chantella Creech Advanced Heart Failure Mechanical Circulatory Support 

## 2022-10-18 NOTE — Telephone Encounter (Signed)
Called patient.  Patient states she has oxygen with [employer] but has never had a CPAP.  She has only done the home sleep study on 07/09/2022 and CPAP titration study on 09/18/2022.  It looks like the DME order was sent in as patient currently has a CPAP and her DME is [facility].  Will notify [facility] team of this and get correct information. Patient states she has never been with [employer]. Will place new order for CPAP per Dr. Sherene Sires with all supplies.  Patient notified.  Nothing further needed.

## 2022-10-18 NOTE — Progress Notes (Signed)
ADVANCED HEART FAILURE CLINIC NOTE  Referring Physician: [facility]  Primary Care: [facility] Primary Cardiologist: Dr. Dina Rich  HPI: Sandra Schneider is a 67 y.o. female with CAD (NSTEMI 06/2012 w/ PCI to LAD and Lcx) LVEF 55% at that time, OSA, HTN, hyperlipidemia, hx of breast ca s/p lumpectomy and persistent shortness of breath and lower extremity edema presenting today for follow up. She also follows Dr. Vassie Loll for OSA.  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  She has noticed that over the past several months she has had worsening lower extremity edema and difficulty ambulating due to shortness of breath.  She had a echocardiogram concerning for pulmonary hypertension followed by right heart cath with a mean PA of 69 mmHg.  Her cardiac output was 7 L/min during that case.  She had a workup for interstitial lung disease by pulmonology with CT chest that was negative.  However there was a right upper lobe lung nodule with negative PET scan.  In addition she has had a colonoscopy and mammogram due to hypermetabolic areas in the colon and breast both of which were also negative.   Since her initial appointment we started her on sildenafil 20 mg 3 times daily.  Unfortunately due to continued dietary indiscretion, significant volume intake and medication noncompliance she presented severely hypervolemic during her last appointment in May 2024.  She was directly admitted to the hospital and diuresed 35 pounds during that admission.  Right heart cath confirmed severe precapillary pulmonary hypertension.  Sildenafil was increased to 40 mg 3 times daily.  Interval hx:  Today Sandra Schneider reports significant improvement in her symptoms after 35 pounds IV diuresis while in hospital.  She is able to walk longer distances without needing to stop with the assistance of her walker.  She is however starting to gain lower  extremity edema once again, reports compliance with all medications and is attempting to decrease sodium intake.  Activity level/exercise tolerance: NYHA IIIb Orthopnea:  Sleeps on 3 pillows Paroxysmal noctural dyspnea: Yes Chest pain/pressure:  No Orthostatic lightheadedness:  No Palpitations:  No Lower extremity edema:  Yes, 1-2+ pretibial pitting edema Presyncope/syncope:  No Cough:  Infrequent but yes  Past Medical History:  Diagnosis Date   Breast cancer (HCC)    Coronary artery disease    Diabetes mellitus    GERD (gastroesophageal reflux disease)    Hypertension    Myocardial abscess    Myocardial infarct (HCC) 06/24/2012   Pulmonary hypertension (HCC)     Current Outpatient Medications  Medication Sig Dispense Refill   acetaminophen (TYLENOL) 500 MG tablet Take 1,000 mg by mouth daily as needed for moderate pain, fever or headache.     amiodarone (PACERONE) 200 MG tablet Take 1 tablet (200 mg total) by mouth 2 (two) times daily. 60 tablet 5   apixaban (ELIQUIS) 5 MG TABS tablet Take 1 tablet (5 mg total) by mouth 2 (two) times daily. 60 tablet 5   Ascorbic Acid (VITAMIN C PO) Take 1 tablet by mouth daily.     atorvastatin (LIPITOR) 40 MG tablet Take 1 tablet by mouth once daily 90 tablet 0   cetirizine (ZYRTEC) 10 MG tablet Take 10 mg by mouth daily.     Cholecalciferol (VITAMIN D-3 PO) Take 1 capsule by mouth daily.     dorzolamide-timolol (COSOPT) 22.3-6.8 MG/ML ophthalmic solution Place 1 drop into both eyes 2 (two) times daily.     glipiZIDE (  GLUCOTROL XL) 5 MG 24 hr tablet Take 5 mg by mouth daily with breakfast.     letrozole (FEMARA) 2.5 MG tablet Take 2.5 mg by mouth daily.     Multiple Vitamin (MULTIVITAMIN) tablet Take 1 tablet by mouth daily.     potassium chloride (KLOR-CON M) 10 MEQ tablet Take 2 tablets (20 mEq total) by mouth daily. 60 tablet 11   sildenafil (REVATIO) 20 MG tablet Take 2 tablets (40 mg total) by mouth 3 (three) times daily. 180 tablet 5    torsemide (DEMADEX) 20 MG tablet Take 2 tablets (40 mg total) by mouth 2 (two) times daily. 120 tablet 5   Current Facility-Administered Medications  Medication Dose Route Frequency Provider Last Rate Last Admin   sodium chloride flush (NS) 0.9 % injection 3 mL  3 mL Intravenous Q12H Branch, Dorothe Pea, MD        No Known Allergies    Social History   Socioeconomic History   Marital status: Widowed    Spouse name: Not on file   Number of children: Not on file   Years of education: Not on file   Highest education level: Not on file  Occupational History   Not on file  Tobacco Use   Smoking status: Never    Passive exposure: Never   Smokeless tobacco: Never  Vaping Use   Vaping Use: Never used  Substance and Sexual Activity   Alcohol use: No   Drug use: No   Sexual activity: Yes    Birth control/protection: Post-menopausal  Other Topics Concern   Not on file  Social History Narrative   Not on file   Social Determinants of Health   Financial Resource Strain: Not on file  Food Insecurity: No Food Insecurity (09/27/2022)   Hunger Vital Sign    Worried About Running Out of Food in the Last Year: Never true    Ran Out of Food in the Last Year: Never true  Transportation Needs: No Transportation Needs (09/27/2022)   PRAPARE - Administrator, Civil Service (Medical): No    Lack of Transportation (Non-Medical): No  Physical Activity: Not on file  Stress: Not on file  Social Connections: Not on file  Intimate Partner Violence: Not At Risk (09/27/2022)   Humiliation, Afraid, Rape, and Kick questionnaire    Fear of Current or Ex-Partner: No    Emotionally Abused: No    Physically Abused: No    Sexually Abused: No      Family History  Problem Relation Age of Onset   Heart disease Father    Diabetes Other    Hypertension Other    Cancer Other    Colon cancer Son 22       alive, doing well.    PHYSICAL EXAM: Vitals:   10/18/22 1040  BP: (!) 150/60   Pulse: (!) 54  SpO2: 99%   GENERAL: chronically ill appearing AAF with walker on 4L  HEENT: Negative for arcus senilis or xanthelasma. There is no scleral icterus.  The mucous membranes are pink and moist.   NECK: Supple, No masses. Normal carotid upstrokes without bruits. No masses or thyromegaly.    CHEST: There are no chest wall deformities. There is no chest wall tenderness. Respirations are unlabored.  Lungs- decreased b/l, no crackles CARDIAC:  JVP: 10 cm          Normal rate with regular rhythm. No murmurs, rubs or gallops.  Pulses are 2+ and symmetrical in upper  and lower extremities. 1-2+ pretibial edema.  ABDOMEN: Soft, non-tender, non-distended. There are no masses or hepatomegaly. There are normal bowel sounds.  EXTREMITIES: Warm and well perfused with no cyanosis, clubbing.  LYMPHATIC: No axillary or supraclavicular lymphadenopathy.  NEUROLOGIC: Patient is oriented x3 with no focal or lateralizing neurologic deficits.  PSYCH: Patients affect is appropriate, there is no evidence of anxiety or depression.  SKIN: Warm and dry; no lesions or wounds.    DATA REVIEW  ECG:06/13/22: NSR with 1AVB  ECHO: 01/18/22: LVBEF 60-65%. Moderately dilated RV with mild reduction in function. Dilated RA.  CATH: 02/08/23:   Prox Cx to Dist Cx lesion is 25% stenosed.   Mid LAD lesion is 25% stenosed.   2nd Mrg lesion is 100% stenosed.  Right to left collaterals.  Left to left collaterals.   The left ventricular systolic function is normal.   LV end diastolic pressure is mildly elevated.   The left ventricular ejection fraction is greater than 65% by visual estimate.   Hemodynamic findings consistent with severe pulmonary hypertension.   There is no aortic valve stenosis.   Aortic saturation 96% on 3 L nasal cannula, PA saturation 69% on 3 L, mean RA pressure 32 mm Hg; PA pressure 107/48, mean PA pressure 69 mmHg, pulmonary capillary wedge pressure difficult to assess due to significant V  waves, and difficulty wedging.  Cardiac output 7.03 L/min, cardiac index 3.07.   Patent stents with mild restenosis in the LAD and circumflex.  Small obtuse marginal appears occluded distally with some right to left collaterals.  No target for PCI.  RHC 09/28/22: RA 32 RV 95/32 PA 93/44, mean 66 PCWP mean 19  Oxygen saturations: PA 49% AO 96%  Cardiac Output (Fick) 3.98  Cardiac Index (Fick) 1.73 PVR 11.8 PAPi 1.5   PFTS: 03/21/22: - No obstructive lung disease.   ASSESSMENT & PLAN:  Pulmonary hypertension, Group I + III - Initial RHC waveforms reviewed personally. RA 30, RV 107/20-30, PA 107/48 (69), unable to wedge; LVEDP 15. AO 140/64. LVEDP ~16. Assuming a PCWP of 20, TPG would be 49 w/ PVR of 7.  - Hemodynamics consistent with severe pulmonary hypertension largely due to pre-capillary PH.  -Cardiac MRI in 5/24 with EF 75%, moderate RV dilation with RV EF 50%, moderate TR, mid-wall basal septal/basal inferolateral LGE. Could be seen with cardiac sarcoidosis (vs prior myocarditis). CT chest showed RUL nodule (negative on PET) and mediastinal LAN. V/Q scan not suggestive of chronic PEs. RHC 5/16 showed severe PAH with low CI 1.73, PAPi 1.5, RA pressure 32 (R>>L heart failure). TEE this admission with LV EF 60-65%, D-shaped septum, moderate RV dilation with moderate RV dysfunction, mod-severe TR. Likely end-stage PH/cor pulmonale d/t OHS/OSA and group 1 PAH.  - Labs: ANA, HCV negative. HIV pending. LFTs mostly unremarkable.  - PFTs w/ moderate restriction.  - BNP previously elevated.  - CT chest w/ pulmonary nodules, however, follow up PET mostly unremarkable.  - Currently on sildenafil 40mg  TID.  - Repeat RHC to evaluate candidacy for ERA.  - Increase torsemide to 60mg  BID for x 3 days.   2. OSA - followed by pulmonology.  - reports compliance with bipap  3. CAD - LHC as above - followed by general cardiology.   4. Severe obesity - BMI 48.54 today - discussed importance  of weight loss.    Ellanie Oppedisano Advanced Heart Failure Mechanical Circulatory Support

## 2022-10-19 ENCOUNTER — Telehealth: Payer: Self-pay | Admitting: *Deleted

## 2022-10-19 NOTE — Telephone Encounter (Signed)
Patient calling back to check on status of CPAP. Please see previous encounters. Please call and advise patient.  [phone]

## 2022-10-20 DIAGNOSIS — N184 Chronic kidney disease, stage 4 (severe): Secondary | ICD-10-CM | POA: Diagnosis not present

## 2022-10-20 DIAGNOSIS — I5033 Acute on chronic diastolic (congestive) heart failure: Secondary | ICD-10-CM | POA: Diagnosis not present

## 2022-10-20 DIAGNOSIS — I252 Old myocardial infarction: Secondary | ICD-10-CM | POA: Diagnosis not present

## 2022-10-20 DIAGNOSIS — D631 Anemia in chronic kidney disease: Secondary | ICD-10-CM | POA: Diagnosis not present

## 2022-10-20 DIAGNOSIS — Z7901 Long term (current) use of anticoagulants: Secondary | ICD-10-CM | POA: Diagnosis not present

## 2022-10-20 DIAGNOSIS — Z9981 Dependence on supplemental oxygen: Secondary | ICD-10-CM | POA: Diagnosis not present

## 2022-10-20 DIAGNOSIS — I50813 Acute on chronic right heart failure: Secondary | ICD-10-CM | POA: Diagnosis not present

## 2022-10-20 DIAGNOSIS — D630 Anemia in neoplastic disease: Secondary | ICD-10-CM | POA: Diagnosis not present

## 2022-10-20 DIAGNOSIS — E1122 Type 2 diabetes mellitus with diabetic chronic kidney disease: Secondary | ICD-10-CM | POA: Diagnosis not present

## 2022-10-20 DIAGNOSIS — I130 Hypertensive heart and chronic kidney disease with heart failure and stage 1 through stage 4 chronic kidney disease, or unspecified chronic kidney disease: Secondary | ICD-10-CM | POA: Diagnosis not present

## 2022-10-20 DIAGNOSIS — G4733 Obstructive sleep apnea (adult) (pediatric): Secondary | ICD-10-CM | POA: Diagnosis not present

## 2022-10-20 DIAGNOSIS — E785 Hyperlipidemia, unspecified: Secondary | ICD-10-CM | POA: Diagnosis not present

## 2022-10-20 DIAGNOSIS — N179 Acute kidney failure, unspecified: Secondary | ICD-10-CM | POA: Diagnosis not present

## 2022-10-20 DIAGNOSIS — I272 Pulmonary hypertension, unspecified: Secondary | ICD-10-CM | POA: Diagnosis not present

## 2022-10-20 DIAGNOSIS — C50411 Malignant neoplasm of upper-outer quadrant of right female breast: Secondary | ICD-10-CM | POA: Diagnosis not present

## 2022-10-20 DIAGNOSIS — E1165 Type 2 diabetes mellitus with hyperglycemia: Secondary | ICD-10-CM | POA: Diagnosis not present

## 2022-10-20 DIAGNOSIS — H409 Unspecified glaucoma: Secondary | ICD-10-CM | POA: Diagnosis not present

## 2022-10-20 DIAGNOSIS — I4819 Other persistent atrial fibrillation: Secondary | ICD-10-CM | POA: Diagnosis not present

## 2022-10-20 DIAGNOSIS — I4892 Unspecified atrial flutter: Secondary | ICD-10-CM | POA: Diagnosis not present

## 2022-10-20 DIAGNOSIS — R911 Solitary pulmonary nodule: Secondary | ICD-10-CM | POA: Diagnosis not present

## 2022-10-20 DIAGNOSIS — K219 Gastro-esophageal reflux disease without esophagitis: Secondary | ICD-10-CM | POA: Diagnosis not present

## 2022-10-20 DIAGNOSIS — I2510 Atherosclerotic heart disease of native coronary artery without angina pectoris: Secondary | ICD-10-CM | POA: Diagnosis not present

## 2022-10-20 NOTE — Telephone Encounter (Signed)
I have spoke with Brittney with [employer] because of the patients insurance they can't bill for machine but can [employer] for supplies. Since the patient has 02 with [employer] I will faxed the order to them

## 2022-10-21 DIAGNOSIS — Z5189 Encounter for other specified aftercare: Secondary | ICD-10-CM | POA: Diagnosis not present

## 2022-10-23 DIAGNOSIS — E1165 Type 2 diabetes mellitus with hyperglycemia: Secondary | ICD-10-CM | POA: Diagnosis not present

## 2022-10-23 DIAGNOSIS — H409 Unspecified glaucoma: Secondary | ICD-10-CM | POA: Diagnosis not present

## 2022-10-23 DIAGNOSIS — I5033 Acute on chronic diastolic (congestive) heart failure: Secondary | ICD-10-CM | POA: Diagnosis not present

## 2022-10-23 DIAGNOSIS — Z7901 Long term (current) use of anticoagulants: Secondary | ICD-10-CM | POA: Diagnosis not present

## 2022-10-23 DIAGNOSIS — I50813 Acute on chronic right heart failure: Secondary | ICD-10-CM | POA: Diagnosis not present

## 2022-10-23 DIAGNOSIS — I130 Hypertensive heart and chronic kidney disease with heart failure and stage 1 through stage 4 chronic kidney disease, or unspecified chronic kidney disease: Secondary | ICD-10-CM | POA: Diagnosis not present

## 2022-10-23 DIAGNOSIS — C50411 Malignant neoplasm of upper-outer quadrant of right female breast: Secondary | ICD-10-CM | POA: Diagnosis not present

## 2022-10-23 DIAGNOSIS — I272 Pulmonary hypertension, unspecified: Secondary | ICD-10-CM | POA: Diagnosis not present

## 2022-10-23 DIAGNOSIS — I4892 Unspecified atrial flutter: Secondary | ICD-10-CM | POA: Diagnosis not present

## 2022-10-23 DIAGNOSIS — Z9981 Dependence on supplemental oxygen: Secondary | ICD-10-CM | POA: Diagnosis not present

## 2022-10-23 DIAGNOSIS — D630 Anemia in neoplastic disease: Secondary | ICD-10-CM | POA: Diagnosis not present

## 2022-10-23 DIAGNOSIS — N179 Acute kidney failure, unspecified: Secondary | ICD-10-CM | POA: Diagnosis not present

## 2022-10-23 DIAGNOSIS — I4819 Other persistent atrial fibrillation: Secondary | ICD-10-CM | POA: Diagnosis not present

## 2022-10-23 DIAGNOSIS — E1122 Type 2 diabetes mellitus with diabetic chronic kidney disease: Secondary | ICD-10-CM | POA: Diagnosis not present

## 2022-10-23 DIAGNOSIS — I252 Old myocardial infarction: Secondary | ICD-10-CM | POA: Diagnosis not present

## 2022-10-23 DIAGNOSIS — R911 Solitary pulmonary nodule: Secondary | ICD-10-CM | POA: Diagnosis not present

## 2022-10-23 DIAGNOSIS — N184 Chronic kidney disease, stage 4 (severe): Secondary | ICD-10-CM | POA: Diagnosis not present

## 2022-10-23 DIAGNOSIS — G4733 Obstructive sleep apnea (adult) (pediatric): Secondary | ICD-10-CM | POA: Diagnosis not present

## 2022-10-23 DIAGNOSIS — K219 Gastro-esophageal reflux disease without esophagitis: Secondary | ICD-10-CM | POA: Diagnosis not present

## 2022-10-23 DIAGNOSIS — E785 Hyperlipidemia, unspecified: Secondary | ICD-10-CM | POA: Diagnosis not present

## 2022-10-23 DIAGNOSIS — I2510 Atherosclerotic heart disease of native coronary artery without angina pectoris: Secondary | ICD-10-CM | POA: Diagnosis not present

## 2022-10-23 DIAGNOSIS — D631 Anemia in chronic kidney disease: Secondary | ICD-10-CM | POA: Diagnosis not present

## 2022-10-24 DIAGNOSIS — K219 Gastro-esophageal reflux disease without esophagitis: Secondary | ICD-10-CM | POA: Diagnosis not present

## 2022-10-24 DIAGNOSIS — E1165 Type 2 diabetes mellitus with hyperglycemia: Secondary | ICD-10-CM | POA: Diagnosis not present

## 2022-10-24 DIAGNOSIS — Z7901 Long term (current) use of anticoagulants: Secondary | ICD-10-CM | POA: Diagnosis not present

## 2022-10-24 DIAGNOSIS — I5033 Acute on chronic diastolic (congestive) heart failure: Secondary | ICD-10-CM | POA: Diagnosis not present

## 2022-10-24 DIAGNOSIS — C50411 Malignant neoplasm of upper-outer quadrant of right female breast: Secondary | ICD-10-CM | POA: Diagnosis not present

## 2022-10-24 DIAGNOSIS — G4733 Obstructive sleep apnea (adult) (pediatric): Secondary | ICD-10-CM | POA: Diagnosis not present

## 2022-10-24 DIAGNOSIS — E785 Hyperlipidemia, unspecified: Secondary | ICD-10-CM | POA: Diagnosis not present

## 2022-10-24 DIAGNOSIS — I4892 Unspecified atrial flutter: Secondary | ICD-10-CM | POA: Diagnosis not present

## 2022-10-24 DIAGNOSIS — D630 Anemia in neoplastic disease: Secondary | ICD-10-CM | POA: Diagnosis not present

## 2022-10-24 DIAGNOSIS — N179 Acute kidney failure, unspecified: Secondary | ICD-10-CM | POA: Diagnosis not present

## 2022-10-24 DIAGNOSIS — H409 Unspecified glaucoma: Secondary | ICD-10-CM | POA: Diagnosis not present

## 2022-10-24 DIAGNOSIS — I130 Hypertensive heart and chronic kidney disease with heart failure and stage 1 through stage 4 chronic kidney disease, or unspecified chronic kidney disease: Secondary | ICD-10-CM | POA: Diagnosis not present

## 2022-10-24 DIAGNOSIS — I252 Old myocardial infarction: Secondary | ICD-10-CM | POA: Diagnosis not present

## 2022-10-24 DIAGNOSIS — I272 Pulmonary hypertension, unspecified: Secondary | ICD-10-CM | POA: Diagnosis not present

## 2022-10-24 DIAGNOSIS — R911 Solitary pulmonary nodule: Secondary | ICD-10-CM | POA: Diagnosis not present

## 2022-10-24 DIAGNOSIS — N184 Chronic kidney disease, stage 4 (severe): Secondary | ICD-10-CM | POA: Diagnosis not present

## 2022-10-24 DIAGNOSIS — I50813 Acute on chronic right heart failure: Secondary | ICD-10-CM | POA: Diagnosis not present

## 2022-10-24 DIAGNOSIS — Z9981 Dependence on supplemental oxygen: Secondary | ICD-10-CM | POA: Diagnosis not present

## 2022-10-24 DIAGNOSIS — D631 Anemia in chronic kidney disease: Secondary | ICD-10-CM | POA: Diagnosis not present

## 2022-10-24 DIAGNOSIS — I2510 Atherosclerotic heart disease of native coronary artery without angina pectoris: Secondary | ICD-10-CM | POA: Diagnosis not present

## 2022-10-24 DIAGNOSIS — I4819 Other persistent atrial fibrillation: Secondary | ICD-10-CM | POA: Diagnosis not present

## 2022-10-24 DIAGNOSIS — E1122 Type 2 diabetes mellitus with diabetic chronic kidney disease: Secondary | ICD-10-CM | POA: Diagnosis not present

## 2022-10-25 DIAGNOSIS — Z853 Personal history of malignant neoplasm of breast: Secondary | ICD-10-CM | POA: Diagnosis not present

## 2022-10-25 DIAGNOSIS — Z9889 Other specified postprocedural states: Secondary | ICD-10-CM | POA: Diagnosis not present

## 2022-10-25 DIAGNOSIS — Z08 Encounter for follow-up examination after completed treatment for malignant neoplasm: Secondary | ICD-10-CM | POA: Diagnosis not present

## 2022-10-25 DIAGNOSIS — G4733 Obstructive sleep apnea (adult) (pediatric): Secondary | ICD-10-CM | POA: Diagnosis not present

## 2022-10-25 DIAGNOSIS — R92323 Mammographic fibroglandular density, bilateral breasts: Secondary | ICD-10-CM | POA: Diagnosis not present

## 2022-10-25 NOTE — Telephone Encounter (Signed)
I spoke with Monisha with [employer] and she confirmed that they have this order and it is being processed

## 2022-10-26 DIAGNOSIS — Z170 Estrogen receptor positive status [ER+]: Secondary | ICD-10-CM | POA: Diagnosis not present

## 2022-10-26 DIAGNOSIS — C50411 Malignant neoplasm of upper-outer quadrant of right female breast: Secondary | ICD-10-CM | POA: Diagnosis not present

## 2022-10-27 ENCOUNTER — Other Ambulatory Visit: Payer: Self-pay | Admitting: Cardiology

## 2022-10-30 ENCOUNTER — Telehealth (HOSPITAL_COMMUNITY): Payer: Self-pay

## 2022-10-30 DIAGNOSIS — G4733 Obstructive sleep apnea (adult) (pediatric): Secondary | ICD-10-CM | POA: Diagnosis not present

## 2022-10-30 DIAGNOSIS — I50813 Acute on chronic right heart failure: Secondary | ICD-10-CM | POA: Diagnosis not present

## 2022-10-30 DIAGNOSIS — D631 Anemia in chronic kidney disease: Secondary | ICD-10-CM | POA: Diagnosis not present

## 2022-10-30 DIAGNOSIS — R911 Solitary pulmonary nodule: Secondary | ICD-10-CM | POA: Diagnosis not present

## 2022-10-30 DIAGNOSIS — Z7901 Long term (current) use of anticoagulants: Secondary | ICD-10-CM | POA: Diagnosis not present

## 2022-10-30 DIAGNOSIS — I4819 Other persistent atrial fibrillation: Secondary | ICD-10-CM | POA: Diagnosis not present

## 2022-10-30 DIAGNOSIS — I130 Hypertensive heart and chronic kidney disease with heart failure and stage 1 through stage 4 chronic kidney disease, or unspecified chronic kidney disease: Secondary | ICD-10-CM | POA: Diagnosis not present

## 2022-10-30 DIAGNOSIS — I2510 Atherosclerotic heart disease of native coronary artery without angina pectoris: Secondary | ICD-10-CM | POA: Diagnosis not present

## 2022-10-30 DIAGNOSIS — I5033 Acute on chronic diastolic (congestive) heart failure: Secondary | ICD-10-CM | POA: Diagnosis not present

## 2022-10-30 DIAGNOSIS — E1165 Type 2 diabetes mellitus with hyperglycemia: Secondary | ICD-10-CM | POA: Diagnosis not present

## 2022-10-30 DIAGNOSIS — C50411 Malignant neoplasm of upper-outer quadrant of right female breast: Secondary | ICD-10-CM | POA: Diagnosis not present

## 2022-10-30 DIAGNOSIS — H409 Unspecified glaucoma: Secondary | ICD-10-CM | POA: Diagnosis not present

## 2022-10-30 DIAGNOSIS — I4892 Unspecified atrial flutter: Secondary | ICD-10-CM | POA: Diagnosis not present

## 2022-10-30 DIAGNOSIS — I272 Pulmonary hypertension, unspecified: Secondary | ICD-10-CM | POA: Diagnosis not present

## 2022-10-30 DIAGNOSIS — I252 Old myocardial infarction: Secondary | ICD-10-CM | POA: Diagnosis not present

## 2022-10-30 DIAGNOSIS — E1122 Type 2 diabetes mellitus with diabetic chronic kidney disease: Secondary | ICD-10-CM | POA: Diagnosis not present

## 2022-10-30 DIAGNOSIS — N184 Chronic kidney disease, stage 4 (severe): Secondary | ICD-10-CM | POA: Diagnosis not present

## 2022-10-30 DIAGNOSIS — K219 Gastro-esophageal reflux disease without esophagitis: Secondary | ICD-10-CM | POA: Diagnosis not present

## 2022-10-30 DIAGNOSIS — N179 Acute kidney failure, unspecified: Secondary | ICD-10-CM | POA: Diagnosis not present

## 2022-10-30 DIAGNOSIS — Z9981 Dependence on supplemental oxygen: Secondary | ICD-10-CM | POA: Diagnosis not present

## 2022-10-30 DIAGNOSIS — E785 Hyperlipidemia, unspecified: Secondary | ICD-10-CM | POA: Diagnosis not present

## 2022-10-30 DIAGNOSIS — D630 Anemia in neoplastic disease: Secondary | ICD-10-CM | POA: Diagnosis not present

## 2022-10-30 NOTE — Telephone Encounter (Signed)
Spoke to patient about procedure scheduled for tomorrow. Patient aware of time and place. Nothing to eat or drink after midnight. Hold Glipizide and torsemide am of procedure. Has transportation to and from procedure.

## 2022-10-31 ENCOUNTER — Ambulatory Visit (HOSPITAL_COMMUNITY)
Admission: RE | Admit: 2022-10-31 | Discharge: 2022-10-31 | Disposition: A | Discharge status: Home / Self Care | Payer: Medicare Other | Attending: Cardiology | Admitting: Cardiology

## 2022-10-31 ENCOUNTER — Encounter (HOSPITAL_COMMUNITY)
Admission: RE | Disposition: A | Discharge status: Home / Self Care | Payer: Self-pay | Source: Home / Self Care | Attending: Cardiology

## 2022-10-31 DIAGNOSIS — G4733 Obstructive sleep apnea (adult) (pediatric): Secondary | ICD-10-CM | POA: Insufficient documentation

## 2022-10-31 DIAGNOSIS — Z79899 Other long term (current) drug therapy: Secondary | ICD-10-CM | POA: Insufficient documentation

## 2022-10-31 DIAGNOSIS — R600 Localized edema: Secondary | ICD-10-CM | POA: Diagnosis not present

## 2022-10-31 DIAGNOSIS — Z955 Presence of coronary angioplasty implant and graft: Secondary | ICD-10-CM | POA: Diagnosis not present

## 2022-10-31 DIAGNOSIS — E669 Obesity, unspecified: Secondary | ICD-10-CM | POA: Diagnosis not present

## 2022-10-31 DIAGNOSIS — I2721 Secondary pulmonary arterial hypertension: Secondary | ICD-10-CM | POA: Diagnosis not present

## 2022-10-31 DIAGNOSIS — I252 Old myocardial infarction: Secondary | ICD-10-CM | POA: Diagnosis not present

## 2022-10-31 DIAGNOSIS — I2510 Atherosclerotic heart disease of native coronary artery without angina pectoris: Secondary | ICD-10-CM | POA: Diagnosis not present

## 2022-10-31 DIAGNOSIS — I272 Pulmonary hypertension, unspecified: Secondary | ICD-10-CM

## 2022-10-31 DIAGNOSIS — I10 Essential (primary) hypertension: Secondary | ICD-10-CM | POA: Insufficient documentation

## 2022-10-31 DIAGNOSIS — Z6841 Body Mass Index (BMI) 40.0 and over, adult: Secondary | ICD-10-CM | POA: Insufficient documentation

## 2022-10-31 DIAGNOSIS — E785 Hyperlipidemia, unspecified: Secondary | ICD-10-CM | POA: Insufficient documentation

## 2022-10-31 DIAGNOSIS — Z853 Personal history of malignant neoplasm of breast: Secondary | ICD-10-CM | POA: Insufficient documentation

## 2022-10-31 HISTORY — PX: RIGHT HEART CATH: CATH118263

## 2022-10-31 LAB — POCT I-STAT EG7
Acid-Base Excess: 2 mmol/L (ref 0.0–2.0)
Acid-Base Excess: 3 mmol/L — ABNORMAL HIGH (ref 0.0–2.0)
Bicarbonate: 28.4 mmol/L — ABNORMAL HIGH (ref 20.0–28.0)
Bicarbonate: 29.2 mmol/L — ABNORMAL HIGH (ref 20.0–28.0)
Calcium, Ion: 1.22 mmol/L (ref 1.15–1.40)
Calcium, Ion: 1.24 mmol/L (ref 1.15–1.40)
HCT: 37 % (ref 36.0–46.0)
HCT: 37 % (ref 36.0–46.0)
Hemoglobin: 12.6 g/dL (ref 12.0–15.0)
Hemoglobin: 12.6 g/dL (ref 12.0–15.0)
O2 Saturation: 59 %
O2 Saturation: 57 %
Potassium: 3.7 mmol/L (ref 3.5–5.1)
Potassium: 3.7 mmol/L (ref 3.5–5.1)
Sodium: 138 mmol/L (ref 135–145)
Sodium: 138 mmol/L (ref 135–145)
TCO2: 30 mmol/L (ref 22–32)
TCO2: 31 mmol/L (ref 22–32)
pCO2, Ven: 48.1 mmHg (ref 44–60)
pCO2, Ven: 49.1 mmHg (ref 44–60)
pH, Ven: 7.378 (ref 7.25–7.43)
pH, Ven: 7.382 (ref 7.25–7.43)
pO2, Ven: 32 mmHg (ref 32–45)
pO2, Ven: 31 mmHg — CL (ref 32–45)

## 2022-10-31 LAB — GLUCOSE, CAPILLARY: Glucose-Capillary: 94 mg/dL (ref 70–99)

## 2022-10-31 SURGERY — RIGHT HEART CATH
Anesthesia: LOCAL

## 2022-10-31 MED ORDER — HEPARIN (PORCINE) IN NACL 1000-0.9 UT/500ML-% IV SOLN
INTRAVENOUS | Status: DC | PRN
  Administered 2022-10-31: 500 mL

## 2022-10-31 MED ORDER — SODIUM CHLORIDE 0.9% FLUSH: 3.0000 mL | INTRAVENOUS | Status: DC | PRN

## 2022-10-31 MED ORDER — SODIUM CHLORIDE 0.9% FLUSH: 3.0000 mL | Freq: Two times a day (BID) | INTRAVENOUS | Status: DC

## 2022-10-31 MED ORDER — SODIUM CHLORIDE 0.9 % IV SOLN: 250.0000 mL | INTRAVENOUS | Status: DC | PRN

## 2022-10-31 MED ORDER — LIDOCAINE HCL (PF) 1 % IJ SOLN
INTRAMUSCULAR | Status: AC
  Filled 2022-10-31: qty 30

## 2022-10-31 MED ORDER — SODIUM CHLORIDE 0.9 % IV SOLN: INTRAVENOUS | Status: DC

## 2022-10-31 MED ORDER — LIDOCAINE HCL (PF) 1 % IJ SOLN
INTRAMUSCULAR | Status: DC | PRN
  Administered 2022-10-31: 1 mL

## 2022-10-31 SURGICAL SUPPLY — 6 items
CATH BALLN WEDGE 5F 110CM (CATHETERS) IMPLANT
GUIDEWIRE .025 260CM (WIRE) IMPLANT
PACK CARDIAC CATHETERIZATION (CUSTOM PROCEDURE TRAY) ×1 IMPLANT
SHEATH GLIDE SLENDER 4/5FR (SHEATH) IMPLANT
TRANSDUCER W/STOPCOCK (MISCELLANEOUS) ×1 IMPLANT
TUBING ART PRESS 72 MALE/FEM (TUBING) IMPLANT

## 2022-10-31 NOTE — Interval H&P Note (Signed)
History and Physical Interval Note:  10/31/2022 10:17 AM  Sandra Schneider  has presented today for surgery, with the diagnosis of hp.  The various methods of treatment have been discussed with the patient and family. After consideration of risks, benefits and other options for treatment, the patient has consented to  Procedure(s): RIGHT HEART CATH (N/A) as a surgical intervention.  The patient's history has been reviewed, patient examined, no change in status, stable for surgery.  I have reviewed the patient's chart and labs.  Questions were answered to the patient's satisfaction.     Rosalyn Archambault

## 2022-10-31 NOTE — Discharge Instructions (Signed)

## 2022-11-01 ENCOUNTER — Other Ambulatory Visit (HOSPITAL_COMMUNITY): Payer: Self-pay

## 2022-11-01 ENCOUNTER — Encounter (HOSPITAL_COMMUNITY): Payer: Self-pay | Admitting: Cardiology

## 2022-11-01 ENCOUNTER — Telehealth (HOSPITAL_COMMUNITY): Payer: Self-pay | Admitting: Pharmacy Technician

## 2022-11-01 NOTE — Telephone Encounter (Signed)
Advanced Heart Failure Patient Advocate Encounter  Prior Authorization for Opsumit has been approved.    PA# [id number] Effective dates: 11/01/22 through 05/15/23  Patients co-pay is $11.20

## 2022-11-01 NOTE — Telephone Encounter (Signed)
Advanced Heart Failure Patient Advocate Encounter  Spoke with patient regarding affordability and paperwork required. Sent docusign link for signatures.

## 2022-11-01 NOTE — Telephone Encounter (Signed)
Advanced Heart Failure Patient Advocate Encounter  Left patient vm regarding Opsumit PSMN and affordability.

## 2022-11-01 NOTE — Telephone Encounter (Signed)
Patient Advocate Encounter   Received notification from [facility] that prior authorization for Opsumit is required.   PA submitted on CoverMyMeds Key [id number] Status is pending   Will continue to follow.

## 2022-11-02 DIAGNOSIS — R911 Solitary pulmonary nodule: Secondary | ICD-10-CM | POA: Diagnosis not present

## 2022-11-02 DIAGNOSIS — Z9981 Dependence on supplemental oxygen: Secondary | ICD-10-CM | POA: Diagnosis not present

## 2022-11-02 DIAGNOSIS — I272 Pulmonary hypertension, unspecified: Secondary | ICD-10-CM | POA: Diagnosis not present

## 2022-11-02 DIAGNOSIS — I4892 Unspecified atrial flutter: Secondary | ICD-10-CM | POA: Diagnosis not present

## 2022-11-02 DIAGNOSIS — D630 Anemia in neoplastic disease: Secondary | ICD-10-CM | POA: Diagnosis not present

## 2022-11-02 DIAGNOSIS — I2510 Atherosclerotic heart disease of native coronary artery without angina pectoris: Secondary | ICD-10-CM | POA: Diagnosis not present

## 2022-11-02 DIAGNOSIS — N179 Acute kidney failure, unspecified: Secondary | ICD-10-CM | POA: Diagnosis not present

## 2022-11-02 DIAGNOSIS — I50813 Acute on chronic right heart failure: Secondary | ICD-10-CM | POA: Diagnosis not present

## 2022-11-02 DIAGNOSIS — I130 Hypertensive heart and chronic kidney disease with heart failure and stage 1 through stage 4 chronic kidney disease, or unspecified chronic kidney disease: Secondary | ICD-10-CM | POA: Diagnosis not present

## 2022-11-02 DIAGNOSIS — I252 Old myocardial infarction: Secondary | ICD-10-CM | POA: Diagnosis not present

## 2022-11-02 DIAGNOSIS — C50411 Malignant neoplasm of upper-outer quadrant of right female breast: Secondary | ICD-10-CM | POA: Diagnosis not present

## 2022-11-02 DIAGNOSIS — E785 Hyperlipidemia, unspecified: Secondary | ICD-10-CM | POA: Diagnosis not present

## 2022-11-02 DIAGNOSIS — I5033 Acute on chronic diastolic (congestive) heart failure: Secondary | ICD-10-CM | POA: Diagnosis not present

## 2022-11-02 DIAGNOSIS — K219 Gastro-esophageal reflux disease without esophagitis: Secondary | ICD-10-CM | POA: Diagnosis not present

## 2022-11-02 DIAGNOSIS — G4733 Obstructive sleep apnea (adult) (pediatric): Secondary | ICD-10-CM | POA: Diagnosis not present

## 2022-11-02 DIAGNOSIS — E1165 Type 2 diabetes mellitus with hyperglycemia: Secondary | ICD-10-CM | POA: Diagnosis not present

## 2022-11-02 DIAGNOSIS — H409 Unspecified glaucoma: Secondary | ICD-10-CM | POA: Diagnosis not present

## 2022-11-02 DIAGNOSIS — I4819 Other persistent atrial fibrillation: Secondary | ICD-10-CM | POA: Diagnosis not present

## 2022-11-02 DIAGNOSIS — Z7901 Long term (current) use of anticoagulants: Secondary | ICD-10-CM | POA: Diagnosis not present

## 2022-11-02 DIAGNOSIS — D631 Anemia in chronic kidney disease: Secondary | ICD-10-CM | POA: Diagnosis not present

## 2022-11-02 DIAGNOSIS — E1122 Type 2 diabetes mellitus with diabetic chronic kidney disease: Secondary | ICD-10-CM | POA: Diagnosis not present

## 2022-11-02 DIAGNOSIS — N184 Chronic kidney disease, stage 4 (severe): Secondary | ICD-10-CM | POA: Diagnosis not present

## 2022-11-03 NOTE — Telephone Encounter (Signed)
Advanced Heart Failure Patient Advocate Encounter  Sent in PSMN, REMS and PA approval to [place]. [facility] will be the dispensing pharmacy.   The patient was approved for a [employer] grant that will help cover the cost of Opsumit. Total amount awarded, $10,000. Eligibility, 10/04/22 - 10/03/23.  ID [id number]  BIN [id number]  PCN [id number]  Group [id number]

## 2022-11-06 ENCOUNTER — Other Ambulatory Visit (HOSPITAL_COMMUNITY): Payer: Self-pay

## 2022-11-06 DIAGNOSIS — E1165 Type 2 diabetes mellitus with hyperglycemia: Secondary | ICD-10-CM | POA: Diagnosis not present

## 2022-11-06 DIAGNOSIS — C50411 Malignant neoplasm of upper-outer quadrant of right female breast: Secondary | ICD-10-CM | POA: Diagnosis not present

## 2022-11-06 DIAGNOSIS — N179 Acute kidney failure, unspecified: Secondary | ICD-10-CM | POA: Diagnosis not present

## 2022-11-06 DIAGNOSIS — Z7901 Long term (current) use of anticoagulants: Secondary | ICD-10-CM | POA: Diagnosis not present

## 2022-11-06 DIAGNOSIS — G4733 Obstructive sleep apnea (adult) (pediatric): Secondary | ICD-10-CM | POA: Diagnosis not present

## 2022-11-06 DIAGNOSIS — I130 Hypertensive heart and chronic kidney disease with heart failure and stage 1 through stage 4 chronic kidney disease, or unspecified chronic kidney disease: Secondary | ICD-10-CM | POA: Diagnosis not present

## 2022-11-06 DIAGNOSIS — R911 Solitary pulmonary nodule: Secondary | ICD-10-CM | POA: Diagnosis not present

## 2022-11-06 DIAGNOSIS — I4892 Unspecified atrial flutter: Secondary | ICD-10-CM | POA: Diagnosis not present

## 2022-11-06 DIAGNOSIS — H409 Unspecified glaucoma: Secondary | ICD-10-CM | POA: Diagnosis not present

## 2022-11-06 DIAGNOSIS — D631 Anemia in chronic kidney disease: Secondary | ICD-10-CM | POA: Diagnosis not present

## 2022-11-06 DIAGNOSIS — Z9981 Dependence on supplemental oxygen: Secondary | ICD-10-CM | POA: Diagnosis not present

## 2022-11-06 DIAGNOSIS — I272 Pulmonary hypertension, unspecified: Secondary | ICD-10-CM | POA: Diagnosis not present

## 2022-11-06 DIAGNOSIS — I4819 Other persistent atrial fibrillation: Secondary | ICD-10-CM | POA: Diagnosis not present

## 2022-11-06 DIAGNOSIS — E785 Hyperlipidemia, unspecified: Secondary | ICD-10-CM | POA: Diagnosis not present

## 2022-11-06 DIAGNOSIS — D630 Anemia in neoplastic disease: Secondary | ICD-10-CM | POA: Diagnosis not present

## 2022-11-06 DIAGNOSIS — I252 Old myocardial infarction: Secondary | ICD-10-CM | POA: Diagnosis not present

## 2022-11-06 DIAGNOSIS — N184 Chronic kidney disease, stage 4 (severe): Secondary | ICD-10-CM | POA: Diagnosis not present

## 2022-11-06 DIAGNOSIS — I50813 Acute on chronic right heart failure: Secondary | ICD-10-CM | POA: Diagnosis not present

## 2022-11-06 DIAGNOSIS — K219 Gastro-esophageal reflux disease without esophagitis: Secondary | ICD-10-CM | POA: Diagnosis not present

## 2022-11-06 DIAGNOSIS — I5033 Acute on chronic diastolic (congestive) heart failure: Secondary | ICD-10-CM | POA: Diagnosis not present

## 2022-11-06 DIAGNOSIS — E1122 Type 2 diabetes mellitus with diabetic chronic kidney disease: Secondary | ICD-10-CM | POA: Diagnosis not present

## 2022-11-06 DIAGNOSIS — I2510 Atherosclerotic heart disease of native coronary artery without angina pectoris: Secondary | ICD-10-CM | POA: Diagnosis not present

## 2022-11-07 ENCOUNTER — Other Ambulatory Visit (HOSPITAL_COMMUNITY): Payer: Self-pay

## 2022-11-07 DIAGNOSIS — R911 Solitary pulmonary nodule: Secondary | ICD-10-CM | POA: Diagnosis not present

## 2022-11-07 DIAGNOSIS — I50810 Right heart failure, unspecified: Secondary | ICD-10-CM | POA: Diagnosis not present

## 2022-11-07 DIAGNOSIS — I4819 Other persistent atrial fibrillation: Secondary | ICD-10-CM | POA: Diagnosis not present

## 2022-11-07 DIAGNOSIS — I272 Pulmonary hypertension, unspecified: Secondary | ICD-10-CM | POA: Diagnosis not present

## 2022-11-07 NOTE — Telephone Encounter (Signed)
Advanced Heart Failure Patient Advocate Encounter  Received enrollment confirmation from [employer] REMS.   Patient ID [id number]

## 2022-11-08 ENCOUNTER — Other Ambulatory Visit (HOSPITAL_COMMUNITY): Payer: Self-pay | Admitting: Cardiology

## 2022-11-08 DIAGNOSIS — E1122 Type 2 diabetes mellitus with diabetic chronic kidney disease: Secondary | ICD-10-CM | POA: Diagnosis not present

## 2022-11-08 DIAGNOSIS — D631 Anemia in chronic kidney disease: Secondary | ICD-10-CM | POA: Diagnosis not present

## 2022-11-08 DIAGNOSIS — I252 Old myocardial infarction: Secondary | ICD-10-CM | POA: Diagnosis not present

## 2022-11-08 DIAGNOSIS — I272 Pulmonary hypertension, unspecified: Secondary | ICD-10-CM | POA: Diagnosis not present

## 2022-11-08 DIAGNOSIS — I130 Hypertensive heart and chronic kidney disease with heart failure and stage 1 through stage 4 chronic kidney disease, or unspecified chronic kidney disease: Secondary | ICD-10-CM | POA: Diagnosis not present

## 2022-11-08 DIAGNOSIS — N184 Chronic kidney disease, stage 4 (severe): Secondary | ICD-10-CM | POA: Diagnosis not present

## 2022-11-08 DIAGNOSIS — Z9981 Dependence on supplemental oxygen: Secondary | ICD-10-CM | POA: Diagnosis not present

## 2022-11-08 DIAGNOSIS — I5033 Acute on chronic diastolic (congestive) heart failure: Secondary | ICD-10-CM | POA: Diagnosis not present

## 2022-11-08 DIAGNOSIS — D630 Anemia in neoplastic disease: Secondary | ICD-10-CM | POA: Diagnosis not present

## 2022-11-08 DIAGNOSIS — Z7901 Long term (current) use of anticoagulants: Secondary | ICD-10-CM | POA: Diagnosis not present

## 2022-11-08 DIAGNOSIS — E1165 Type 2 diabetes mellitus with hyperglycemia: Secondary | ICD-10-CM | POA: Diagnosis not present

## 2022-11-08 DIAGNOSIS — I4819 Other persistent atrial fibrillation: Secondary | ICD-10-CM | POA: Diagnosis not present

## 2022-11-08 DIAGNOSIS — H409 Unspecified glaucoma: Secondary | ICD-10-CM | POA: Diagnosis not present

## 2022-11-08 DIAGNOSIS — E785 Hyperlipidemia, unspecified: Secondary | ICD-10-CM | POA: Diagnosis not present

## 2022-11-08 DIAGNOSIS — I2510 Atherosclerotic heart disease of native coronary artery without angina pectoris: Secondary | ICD-10-CM | POA: Diagnosis not present

## 2022-11-08 DIAGNOSIS — I50813 Acute on chronic right heart failure: Secondary | ICD-10-CM | POA: Diagnosis not present

## 2022-11-08 DIAGNOSIS — C50411 Malignant neoplasm of upper-outer quadrant of right female breast: Secondary | ICD-10-CM | POA: Diagnosis not present

## 2022-11-08 DIAGNOSIS — R911 Solitary pulmonary nodule: Secondary | ICD-10-CM | POA: Diagnosis not present

## 2022-11-08 DIAGNOSIS — G4733 Obstructive sleep apnea (adult) (pediatric): Secondary | ICD-10-CM | POA: Diagnosis not present

## 2022-11-08 DIAGNOSIS — I4892 Unspecified atrial flutter: Secondary | ICD-10-CM | POA: Diagnosis not present

## 2022-11-08 DIAGNOSIS — K219 Gastro-esophageal reflux disease without esophagitis: Secondary | ICD-10-CM | POA: Diagnosis not present

## 2022-11-08 DIAGNOSIS — N179 Acute kidney failure, unspecified: Secondary | ICD-10-CM | POA: Diagnosis not present

## 2022-11-08 MED ORDER — AMIODARONE HCL 200 MG PO TABS: 200.0000 mg | ORAL_TABLET | Freq: Every day | ORAL | 5 refills | Status: DC

## 2022-11-09 ENCOUNTER — Telehealth (HOSPITAL_COMMUNITY): Payer: Self-pay | Admitting: Pharmacist

## 2022-11-09 NOTE — Telephone Encounter (Signed)
Per pt Shipment received Aware ok to start in addition to current medication regimen

## 2022-11-09 NOTE — Telephone Encounter (Signed)
RHC from 10/31/22 with mPAP 57 mmHg, PCWP 9 mmHg and PVR 7 WU. Discussed RHC results with Dr. Gasper Lloyd. Plan is to start Opsumit for pulmonary hypertension.  Will add to medication list.   Karle Plumber, PharmD, BCPS, BCCP, CPP Heart Failure Clinic Pharmacist [phone]

## 2022-11-09 NOTE — Telephone Encounter (Signed)
Advanced Heart Failure Patient Advocate Encounter  Received notification from [employer] that Opsumit free trial was shipped 06/26. [facility] should be dispensing the medication moving forward.

## 2022-11-14 ENCOUNTER — Telehealth: Payer: Self-pay | Admitting: Pharmacist

## 2022-11-15 ENCOUNTER — Encounter: Payer: Self-pay | Admitting: Student

## 2022-11-15 ENCOUNTER — Ambulatory Visit: Payer: Medicare Other | Attending: Internal Medicine | Admitting: Student

## 2022-11-15 VITALS — Ht 65.0 in | Wt 236.0 lb

## 2022-11-15 NOTE — Progress Notes (Signed)
HPI: Sandra Schneider is a 67 y.o. female patient referred to pharmacy clinic by Dr. Erin Hearing to initiate weight loss therapy with GLP1-RA.  Most recent BMI 39.weight 236 lbs. PMH significant for hx of NSTEMI, CAD, PAF, HTN, OSA on CPAP, T2DM, CKD stage 4, class 3 obesity,    Today we discussed diet in detail, and suggested some modification so that when she go on GLP1 treatment it will improve tolerability. Discussed dose titration and administration technique for GLP1.    Current weight management medications: none   Previously tried meds: none   Current meds that may affect weight: none   Baseline weight/BMI: 236lbs 39 kg/m2   Diet:  -Breakfast: cereals, boiled egg, oats  -Lunch: peanut butter sandwich or skip most day  -Dinner: meat- baked or green, salads -Snacks:fruits and sweets - once a day  -Drinks: diet drink I can per day and water rest of the day  Low sodium heart healthy diet     Exercise: leg strengthening exercise, on oxygen limits exercise capacity walks 10-15 min per day    Family History:   Confirmed patient hs not personal or family history of medullary thyroid carcinoma (MTC) or Multiple Endocrine Neoplasia syndrome type 2 (MEN 2)  Labs: Lab Results  Component Value Date   HGBA1C 5.4 09/08/2022    Wt Readings from Last 1 Encounters:  11/15/22 236 lb (107 kg)    BP Readings from Last 1 Encounters:  10/31/22 (!) 165/62   Pulse Readings from Last 1 Encounters:  10/31/22 66       Component Value Date/Time   CHOL 96 08/09/2022 0933   TRIG 60 08/09/2022 0933   HDL 40 (L) 08/09/2022 0933   CHOLHDL 2.4 08/09/2022 0933   VLDL 12 08/09/2022 0933   LDLCALC 44 08/09/2022 0933    Past Medical History:  Diagnosis Date   Breast cancer (HCC)    Coronary artery disease    Diabetes mellitus    GERD (gastroesophageal reflux disease)    Hypertension    Myocardial abscess    Myocardial infarct (HCC) 06/24/2012   Pulmonary  hypertension (HCC)     Current Outpatient Medications on File Prior to Visit  Medication Sig Dispense Refill   acetaminophen (TYLENOL) 325 MG tablet Take 650 mg by mouth every 6 (six) hours as needed for moderate pain.     amiodarone (PACERONE) 200 MG tablet Take 1 tablet (200 mg total) by mouth daily. 30 tablet 5   amLODipine (NORVASC) 5 MG tablet Take 1 tablet (5 mg total) by mouth daily. 90 tablet 3   apixaban (ELIQUIS) 5 MG TABS tablet Take 1 tablet (5 mg total) by mouth 2 (two) times daily. 60 tablet 5   Ascorbic Acid (VITAMIN C PO) Take 1 tablet by mouth daily.     atorvastatin (LIPITOR) 40 MG tablet Take 1 tablet by mouth once daily 90 tablet 2   carboxymethylcellulose (REFRESH TEARS) 0.5 % SOLN Place 1 drop into both eyes daily as needed (dry eyes).     cetirizine (ZYRTEC) 10 MG tablet Take 10 mg by mouth daily.     Cholecalciferol (VITAMIN D) 50 MCG (2000 UT) tablet Take 2,000 Units by mouth daily.     dorzolamide-timolol (COSOPT) 22.3-6.8 MG/ML ophthalmic solution Place 1 drop into both eyes 2 (two) times daily.     famotidine (PEPCID) 20 MG tablet Take 20 mg by mouth daily.     letrozole (FEMARA) 2.5 MG tablet Take 2.5 mg by mouth  daily.     macitentan (OPSUMIT) 10 MG tablet Take 10 mg by mouth daily.     Multiple Vitamin (MULTIVITAMIN) tablet Take 1 tablet by mouth daily.     potassium chloride (KLOR-CON M) 10 MEQ tablet Take 2 tablets (20 mEq total) by mouth daily. 60 tablet 11   sildenafil (REVATIO) 20 MG tablet Take 2 tablets (40 mg total) by mouth 3 (three) times daily. 180 tablet 5   torsemide (DEMADEX) 20 MG tablet Take 2 tablets (40 mg total) by mouth 2 (two) times daily. 120 tablet 5   glipiZIDE (GLUCOTROL) 5 MG tablet Take 5 mg by mouth at bedtime.     Current Facility-Administered Medications on File Prior to Visit  Medication Dose Route Frequency Provider Last Rate Last Admin   sodium chloride flush (NS) 0.9 % injection 3 mL  3 mL Intravenous Q12H Branch, Dorothe Pea,  MD        No Known Allergies  Assessment/Plan   Class 3 obesity (HCC) Assessment/Plan:  Baseline BMI 39 kg/m2 and weight 236 lbs  Dicussed diet in detail suggested some modification ( cut down on carb and fat intake)  Dicussed GLP1- for BG control and weight management- dose titration, administration technique, common and uncommon side effects and management of common side effects  Will apply for prior authorization for insurance's preferred GLP1 When patient stat GLP1 may need adjustment to her current BG lowering agent ( glipizide 5 mg daily)   Carmela Hurt, [address]D [facility] A Division of [facility] [address] [place], [place] [address]  Phone: [phone]; Fax: [phone]

## 2022-11-15 NOTE — Patient Instructions (Signed)
GLP1 Agonist Titration Plan:  Will plan to follow the titration plan as below, pending patient is tolerating each dose before increasing to the next. Can slow titration if needed for tolerability.    Ozempic:  -Month 1: Inject Ozempic 0.25 mg SQ once weekly x 4 weeks -Month 2: Inject Ozempic 0.5 mg  SQ once weekly x 4 weeks -Month 3: Inject Ozempic 1 mg SQ once weekly x 4 weeks -Month 4+: Inject Ozempic 2 mg SQ once weekly  

## 2022-11-15 NOTE — Assessment & Plan Note (Addendum)
Assessment/Plan:  Baseline BMI 39 kg/m2 and weight 236 lbs  Dicussed diet in detail suggested some modification ( cut down on carb and fat intake)  Dicussed GLP1- for BG control and weight management- dose titration, administration technique, common and uncommon side effects and management of common side effects  Will apply for prior authorization for insurance's preferred GLP1 When patient stat GLP1 may need adjustment to her current BG lowering agent ( glipizide 5 mg daily)

## 2022-11-17 ENCOUNTER — Encounter (HOSPITAL_COMMUNITY): Payer: Self-pay | Admitting: Cardiology

## 2022-11-17 ENCOUNTER — Ambulatory Visit (HOSPITAL_COMMUNITY)
Admission: RE | Admit: 2022-11-17 | Discharge: 2022-11-17 | Disposition: A | Discharge status: Home / Self Care | Payer: Medicare Other | Source: Ambulatory Visit | Attending: Cardiology | Admitting: Cardiology

## 2022-11-17 VITALS — BP 140/62 | HR 60 | Wt 237.2 lb

## 2022-11-17 DIAGNOSIS — I2723 Pulmonary hypertension due to lung diseases and hypoxia: Secondary | ICD-10-CM | POA: Diagnosis not present

## 2022-11-17 DIAGNOSIS — Z6841 Body Mass Index (BMI) 40.0 and over, adult: Secondary | ICD-10-CM | POA: Insufficient documentation

## 2022-11-17 DIAGNOSIS — Z955 Presence of coronary angioplasty implant and graft: Secondary | ICD-10-CM | POA: Diagnosis not present

## 2022-11-17 DIAGNOSIS — I252 Old myocardial infarction: Secondary | ICD-10-CM | POA: Insufficient documentation

## 2022-11-17 DIAGNOSIS — Z79899 Other long term (current) drug therapy: Secondary | ICD-10-CM | POA: Diagnosis not present

## 2022-11-17 DIAGNOSIS — R0602 Shortness of breath: Secondary | ICD-10-CM | POA: Insufficient documentation

## 2022-11-17 DIAGNOSIS — R609 Edema, unspecified: Secondary | ICD-10-CM | POA: Insufficient documentation

## 2022-11-17 DIAGNOSIS — I50810 Right heart failure, unspecified: Secondary | ICD-10-CM

## 2022-11-17 DIAGNOSIS — I2510 Atherosclerotic heart disease of native coronary artery without angina pectoris: Secondary | ICD-10-CM | POA: Insufficient documentation

## 2022-11-17 DIAGNOSIS — I272 Pulmonary hypertension, unspecified: Secondary | ICD-10-CM | POA: Diagnosis not present

## 2022-11-17 DIAGNOSIS — I10 Essential (primary) hypertension: Secondary | ICD-10-CM | POA: Diagnosis not present

## 2022-11-17 DIAGNOSIS — Z853 Personal history of malignant neoplasm of breast: Secondary | ICD-10-CM | POA: Insufficient documentation

## 2022-11-17 DIAGNOSIS — I2721 Secondary pulmonary arterial hypertension: Secondary | ICD-10-CM | POA: Diagnosis not present

## 2022-11-17 DIAGNOSIS — E785 Hyperlipidemia, unspecified: Secondary | ICD-10-CM | POA: Diagnosis not present

## 2022-11-17 DIAGNOSIS — G4733 Obstructive sleep apnea (adult) (pediatric): Secondary | ICD-10-CM | POA: Insufficient documentation

## 2022-11-17 LAB — BASIC METABOLIC PANEL
Anion gap: 9 (ref 5–15)
BUN: 32 mg/dL — ABNORMAL HIGH (ref 8–23)
CO2: 24 mmol/L (ref 22–32)
Calcium: 9.4 mg/dL (ref 8.9–10.3)
Chloride: 103 mmol/L (ref 98–111)
Creatinine, Ser: 1.69 mg/dL — ABNORMAL HIGH (ref 0.44–1.00)
GFR, Estimated: 33 mL/min — ABNORMAL LOW (ref >60)
Glucose, Bld: 55 mg/dL — ABNORMAL LOW (ref 70–99)
Potassium: 3.7 mmol/L (ref 3.5–5.1)
Sodium: 136 mmol/L (ref 135–145)

## 2022-11-17 LAB — BRAIN NATRIURETIC PEPTIDE: B Natriuretic Peptide: 326.7 pg/mL — ABNORMAL HIGH (ref 0.0–100.0)

## 2022-11-17 NOTE — Patient Instructions (Signed)
Medication Changes:  PLEASE TAKE TORSEMIDE 60mg  TWICE DAILY TOMORROW AND THEN RESUME NORMAL DOSING   Lab Work:  .Labs done today, your results will be available in MyChart, we will contact you for abnormal readings.  Follow-Up in: 1 MONTH AS SCHEDULED  WITH DR. Gasper Lloyd  At the [facility], you and your health needs are our priority. We have a designated team specialized in the treatment of Heart Failure. This Care Team includes your primary Heart Failure Specialized Cardiologist (physician), Advanced Practice Providers (APPs- Physician Assistants and Nurse Practitioners), and Pharmacist who all work together to provide you with the care you need, when you need it.   You may see any of the following providers on your designated Care Team at your next follow up:  Dr. Arvilla Meres Dr. Marca Ancona Dr. Marcos Eke, NP Robbie Lis, [place] [facility] [place], [place] Brynda Peon, NP Karle Plumber, PharmD   Please be sure to bring in all your medications bottles to every appointment.   Need to Contact us:  If you have any questions or concerns before your next appointment please send [place] a message through [place] or call our office at [phone].    TO LEAVE A MESSAGE FOR THE NURSE SELECT OPTION 2, PLEASE LEAVE A MESSAGE INCLUDING: YOUR NAME DATE OF BIRTH CALL BACK NUMBER REASON FOR CALL**this is important as we prioritize the call backs  YOU WILL RECEIVE A CALL BACK THE SAME DAY AS LONG AS YOU CALL BEFORE 4:00 PM

## 2022-11-17 NOTE — Progress Notes (Signed)
ADVANCED HE[facility] FAILURE CLINIC NOTE  Referring Physician: [facility] [place]  Primary Care: Wilmon Pali, FNP Primary Cardiologist: Dr. Dina Rich  HPI: Sandra Schneider is a 67 y.o. female with CAD (NSTEMI 06/2012 w/ PCI to LAD and Lcx) LVEF 55% at that time, OSA, HTN, hyperlipidemia, hx of breast ca s/p lumpectomy and persistent shortness of breath and lower extremity edema presenting today for follow up. She also follows Dr. Vassie Loll for OSA.  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  She has noticed that over the past several months she has had worsening lower extremity edema and difficulty ambulating due to shortness of breath.  She had a echocardiogram concerning for pulmonary hypertension followed by right heart cath with a mean PA of 69 mmHg.  Her cardiac output was 7 L/min during that case.  She had a workup for interstitial lung disease by pulmonology with CT chest that was negative.  However there was a right upper lobe lung nodule with negative PET scan.  In addition she has had a colonoscopy and mammogram due to hypermetabolic areas in the colon and breast both of which were also negative.   Since her initial appointment we started her on sildenafil 20 mg 3 times daily.  Unfortunately due to continued dietary indiscretion, significant volume intake and medication noncompliance she presented severely hypervolemic during her last appointment in May 2024.  She was directly admitted to the hospital and diuresed 35 pounds during that admission.  Right heart cath confirmed severe precapillary pulmonary hypertension.  Sildenafil was increased to 40 mg 3 times daily.  Interval hx:  - Now S/P RHC with low PCWP but elevated PVR - Started on opsumit 10mg  roughly 1 week ago.  - She is now attempting to exercise and goes on 10 minute walks twice daily now. She is not using the motorized scooter as often at [employer].  - No diarrhea, GI symptoms or side  effects.   Activity level/exercise tolerance: NYHA III Orthopnea:  Sleeps on 3 pillows Paroxysmal noctural dyspnea: Yes Chest pain/pressure:  No Orthostatic lightheadedness:  No Palpitations:  No Lower extremity edema:  Yes, 1-2+ pretibial pitting edema Presyncope/syncope:  No Cough:  Infrequent but yes  Past Medical History:  Diagnosis Date   Breast cancer (HCC)    Coronary artery disease    Diabetes mellitus    GERD (gastroesophageal reflux disease)    Hypertension    Myocardial abscess    Myocardial infarct (HCC) 06/24/2012   Pulmonary hypertension (HCC)     Current Outpatient Medications  Medication Sig Dispense Refill   acetaminophen (TYLENOL) 325 MG tablet Take 650 mg by mouth every 6 (six) hours as needed for moderate pain.     amiodarone (PACERONE) 200 MG tablet Take 1 tablet (200 mg total) by mouth daily. 30 tablet 5   amLODipine (NORVASC) 5 MG tablet Take 1 tablet (5 mg total) by mouth daily. 90 tablet 3   apixaban (ELIQUIS) 5 MG TABS tablet Take 1 tablet (5 mg total) by mouth 2 (two) times daily. 60 tablet 5   Ascorbic Acid (VITAMIN C PO) Take 1 tablet by mouth daily.     atorvastatin (LIPITOR) 40 MG tablet Take 1 tablet by mouth once daily 90 tablet 2   carboxymethylcellulose (REFRESH TEARS) 0.5 % SOLN Place 1 drop into both eyes daily as needed (dry eyes).     cetirizine (ZYRTEC) 10 MG tablet Take 10 mg by mouth daily.  Cholecalciferol (VITAMIN D) 50 MCG (2000 UT) tablet Take 2,000 Units by mouth daily.     dorzolamide-timolol (COSOPT) 22.3-6.8 MG/ML ophthalmic solution Place 1 drop into both eyes 2 (two) times daily.     famotidine (PEPCID) 20 MG tablet Take 20 mg by mouth daily.     glipiZIDE (GLUCOTROL) 5 MG tablet Take 5 mg by mouth at bedtime.     letrozole (FEMARA) 2.5 MG tablet Take 2.5 mg by mouth daily.     macitentan (OPSUMIT) 10 MG tablet Take 10 mg by mouth daily.     Multiple Vitamin (MULTIVITAMIN) tablet Take 1 tablet by mouth daily.      potassium chloride (KLOR-CON M) 10 MEQ tablet Take 2 tablets (20 mEq total) by mouth daily. 60 tablet 11   sildenafil (REVATIO) 20 MG tablet Take 2 tablets (40 mg total) by mouth 3 (three) times daily. 180 tablet 5   torsemide (DEMADEX) 20 MG tablet Take 2 tablets (40 mg total) by mouth 2 (two) times daily. 120 tablet 5   Current Facility-Administered Medications  Medication Dose Route Frequency Provider Last Rate Last Admin   sodium chloride flush (NS) 0.9 % injection 3 mL  3 mL Intravenous Q12H Branch, Dorothe Pea, MD        No Known Allergies    Social History   Socioeconomic History   Marital status: Widowed    Spouse name: Not on file   Number of children: Not on file   Years of education: Not on file   Highest education level: Not on file  Occupational History   Not on file  Tobacco Use   Smoking status: Never    Passive exposure: Never   Smokeless tobacco: Never  Vaping Use   Vaping Use: Never used  Substance and Sexual Activity   Alcohol use: No   Drug use: No   Sexual activity: Yes    Birth control/protection: Post-menopausal  Other Topics Concern   Not on file  Social History Narrative   Not on file   Social Determinants of Health   Financial Resource Strain: Not on file  Food Insecurity: No Food Insecurity (09/27/2022)   Hunger Vital Sign    Worried About Running Out of Food in the Last Year: Never true    Ran Out of Food in the Last Year: Never true  Transportation Needs: No Transportation Needs (09/27/2022)   PRAPARE - Administrator, Civil Service (Medical): No    Lack of Transportation (Non-Medical): No  Physical Activity: Not on file  Stress: Not on file  Social Connections: Not on file  Intimate Partner Violence: Not At Risk (09/27/2022)   Humiliation, Afraid, Rape, and Kick questionnaire    Fear of Current or Ex-Partner: No    Emotionally Abused: No    Physically Abused: No    Sexually Abused: No      Family History  Problem  Relation Age of Onset   Heart disease Father    Diabetes Other    Hypertension Other    Cancer Other    Colon cancer Son 22       alive, doing well.    PHYSICAL EXAM: Vitals:   11/17/22 0859  BP: (!) 140/62  Pulse: 60  SpO2: 93%   GENERAL: Well nourished, well developed, and in no apparent distress at rest.  HEENT: Negative for arcus senilis or xanthelasma. There is no scleral icterus.  The mucous membranes are pink and moist.   NECK: Supple, No  masses. Normal carotid upstrokes without bruits. No masses or thyromegaly.    CHEST: There are no chest wall deformities. There is no chest wall tenderness. Respirations are unlabored.  Lungs- coarse lung sounds with mild inspiratory crackles CARDIAC:  JVP: 9 cm          Normal rate with regular rhythm. No murmurs, rubs or gallops.  Pulses are 2+ and symmetrical in upper and lower extremities. No edema.  ABDOMEN: Soft, non-tender, non-distended. There are no masses or hepatomegaly. There are normal bowel sounds.  EXTREMITIES: Warm and well perfused with no cyanosis, clubbing.  LYMPHATIC: No axillary or supraclavicular lymphadenopathy.  NEUROLOGIC: Patient is oriented x3 with no focal or lateralizing neurologic deficits.  PSYCH: Patients affect is appropriate, there is no evidence of anxiety or depression.  SKIN: Warm and dry; no lesions or wounds.     DATA REVIEW  ECG:06/13/22: NSR with 1AVB  ECHO: 01/18/22: LVBEF 60-65%. Moderately dilated RV with mild reduction in function. Dilated RA.  CATH: 02/08/23:   Prox Cx to Dist Cx lesion is 25% stenosed.   Mid LAD lesion is 25% stenosed.   2nd Mrg lesion is 100% stenosed.  Right to left collaterals.  Left to left collaterals.   The left ventricular systolic function is normal.   LV end diastolic pressure is mildly elevated.   The left ventricular ejection fraction is greater than 65% by visual estimate.   Hemodynamic findings consistent with severe pulmonary hypertension.   There is no  aortic valve stenosis.   Aortic saturation 96% on 3 L nasal cannula, PA saturation 69% on 3 L, mean RA pressure 32 mm Hg; PA pressure 107/48, mean PA pressure 69 mmHg, pulmonary capillary wedge pressure difficult to assess due to significant V waves, and difficulty wedging.  Cardiac output 7.03 L/min, cardiac index 3.07.   Patent stents with mild restenosis in the LAD and circumflex.  Small obtuse marginal appears occluded distally with some right to left collaterals.  No target for PCI.  RHC 09/28/22: RA 32 RV 95/32 PA 93/44, mean 66 PCWP mean 19  Oxygen saturations: PA 49% AO 96%  Cardiac Output (Fick) 3.98  Cardiac Index (Fick) 1.73 PVR 11.8 PAPi 1.5   RHC 10/31/22:  HEMODYNAMICS: RA:                  10 mmHg (mean) RV:                  104/3-10 mmHg PA:                  100/27 mmHg (57 mean) PCWP:            9 mmHg (mean)                                      Estimated Fick CO/CI   6.7 L/min, 3.15 L/min/m2                                            TPG                 48  mmHg  PVR                 7 Wood Units  PAPi                10.4    PFTS: 03/21/22: - No obstructive lung disease.   ASSESSMENT & PLAN:  Pulmonary hypertension, Group I + III - Initial RHC waveforms reviewed personally. RA 30, RV 107/20-30, PA 107/48 (69), unable to wedge; LVEDP 15. AO 140/64. LVEDP ~16. Assuming a PCWP of 20, TPG would be 49 w/ PVR of 7.  - Repeat RHC while on sildenafil 20mg  TID as above with PVR of 7WU - Hemodynamics consistent with severe pulmonary hypertension largely due to pre-capillary PH.  -Cardiac MRI in 5/24 with EF 75%, moderate RV dilation with RV EF 50%, moderate TR, mid-wall basal septal/basal inferolateral LGE. Could be seen with cardiac sarcoidosis (vs prior myocarditis). CT chest showed RUL nodule (negative on PET) and mediastinal LAN. V/Q scan not suggestive of chronic PEs. RHC 5/16 showed severe PAH with low CI 1.73, PAPi 1.5, RA  pressure 32 (R>>L heart failure). TEE this admission with LV EF 60-65%, D-shaped septum, moderate RV dilation with moderate RV dysfunction, mod-severe TR. Likely end-stage PH/cor pulmonale d/t OHS/OSA and group 1 PAH.  - Labs: ANA, HCV negative. HIV pending. LFTs mostly unremarkable.  - PFTs w/ moderate restriction.  - BNP previously elevated.  - CT chest w/ pulmonary nodules, however, follow up PET mostly unremarkable.  - Currently on sildenafil 40mg  TID; now started on  macitentan 10mg   - Repeat RHC with PVR of 7 wood units and PCWP of 9; weight at that time was 234lbs; today weight is 237lbs. Currently on torsemide 40mg  BID; will increase to 60mg  BID for tomorrow.  - Plan to start uptravi. Repeat BNP/BMP today.   2. OSA - followed by pulmonology.  - reports compliance with bipap  3. CAD - LHC as above - followed by general cardiology.   4. Severe obesity - BMI 48.54 today - discussed importance of weight loss.  - Now seen in weight loss clinic with plan to start GLP1.   Sandra Schneider Advanced [facility] Mechanical Circulatory Support

## 2022-11-20 ENCOUNTER — Other Ambulatory Visit (HOSPITAL_COMMUNITY): Payer: Self-pay

## 2022-11-20 NOTE — Telephone Encounter (Signed)
Ozempic, Mounjaro, Trulicity, and Byetta are covered without a PA. Victoza/Saxenda, and [id number], are NOT a covered benefit.

## 2022-11-21 MED ORDER — MOUNJARO 2.5 MG/0.5ML ~~LOC~~ SOAJ: 2.5000 mg | SUBCUTANEOUS | 0 refills | Status: DC

## 2022-11-21 NOTE — Telephone Encounter (Signed)
Prescription for Mounjaro 2.5 mg once week - 1 month sent to the [facility]. Patient made aware.  Follow up due in 4 weeks

## 2022-11-22 NOTE — Telephone Encounter (Signed)
Advanced Heart Failure Patient Advocate Encounter  Received notification from [facility] that Opsumit was shipped to patient on 11/21/22

## 2022-11-23 ENCOUNTER — Telehealth (HOSPITAL_COMMUNITY): Payer: Self-pay | Admitting: Cardiology

## 2022-11-23 NOTE — Telephone Encounter (Signed)
Pt left messag on triage line reporting [facility] Will not renew furoscix-application is still valid however order will be on HOLD  Will need to call [phone] if delivery is needed per [facility]

## 2022-11-24 ENCOUNTER — Telehealth (HOSPITAL_COMMUNITY): Payer: Self-pay | Admitting: Cardiology

## 2022-11-24 DIAGNOSIS — G4733 Obstructive sleep apnea (adult) (pediatric): Secondary | ICD-10-CM | POA: Diagnosis not present

## 2022-11-24 NOTE — Telephone Encounter (Signed)
Patient called regarding opsumit Reports all meds reviewed with opsumit pharmacy and was noted possible interaction between amiodarone and opsumit.  See below from Karle Plumber, Pharm D  Amiodarone may increase the serum concentration of Opsumit as it is a moderate dual inhibitor of CYP3A4 and CYP2C9. It is listed in the package insert to avoid combining if possible. However, other data has shown that amiodarone may be a weaker inhibitor of these enzymes than the medication used to conduct those PK modeling studies. Because of that, it is recommended that if combined, the patient should be monitored for increased side effects from the Opsumit. The medications are not contraindicated and can be used in combination as long as the patient is monitored appropriately.   if they still refuse to dispense we can change her to ambrisentan, but since she would already have started the 30 day trial and is theoretically not having any issues since she hasn't complained, kind of hate to do that for a theoretical interaction   Pt aware of above

## 2022-11-30 DIAGNOSIS — H1033 Unspecified acute conjunctivitis, bilateral: Secondary | ICD-10-CM | POA: Diagnosis not present

## 2022-11-30 DIAGNOSIS — E119 Type 2 diabetes mellitus without complications: Secondary | ICD-10-CM | POA: Diagnosis not present

## 2022-11-30 DIAGNOSIS — Z7984 Long term (current) use of oral hypoglycemic drugs: Secondary | ICD-10-CM | POA: Diagnosis not present

## 2022-11-30 DIAGNOSIS — Z961 Presence of intraocular lens: Secondary | ICD-10-CM | POA: Diagnosis not present

## 2022-12-04 DIAGNOSIS — H1033 Unspecified acute conjunctivitis, bilateral: Secondary | ICD-10-CM | POA: Diagnosis not present

## 2022-12-06 DIAGNOSIS — I252 Old myocardial infarction: Secondary | ICD-10-CM | POA: Diagnosis not present

## 2022-12-06 DIAGNOSIS — I509 Heart failure, unspecified: Secondary | ICD-10-CM | POA: Diagnosis not present

## 2022-12-06 DIAGNOSIS — E119 Type 2 diabetes mellitus without complications: Secondary | ICD-10-CM | POA: Diagnosis not present

## 2022-12-06 DIAGNOSIS — I2510 Atherosclerotic heart disease of native coronary artery without angina pectoris: Secondary | ICD-10-CM | POA: Diagnosis not present

## 2022-12-06 DIAGNOSIS — Z713 Dietary counseling and surveillance: Secondary | ICD-10-CM | POA: Diagnosis not present

## 2022-12-06 DIAGNOSIS — Z7182 Exercise counseling: Secondary | ICD-10-CM | POA: Diagnosis not present

## 2022-12-06 DIAGNOSIS — E785 Hyperlipidemia, unspecified: Secondary | ICD-10-CM | POA: Diagnosis not present

## 2022-12-06 DIAGNOSIS — C50919 Malignant neoplasm of unspecified site of unspecified female breast: Secondary | ICD-10-CM | POA: Diagnosis not present

## 2022-12-06 DIAGNOSIS — I10 Essential (primary) hypertension: Secondary | ICD-10-CM | POA: Diagnosis not present

## 2022-12-06 DIAGNOSIS — Z79899 Other long term (current) drug therapy: Secondary | ICD-10-CM | POA: Diagnosis not present

## 2022-12-07 DIAGNOSIS — I50810 Right heart failure, unspecified: Secondary | ICD-10-CM | POA: Diagnosis not present

## 2022-12-07 DIAGNOSIS — I272 Pulmonary hypertension, unspecified: Secondary | ICD-10-CM | POA: Diagnosis not present

## 2022-12-07 DIAGNOSIS — H1033 Unspecified acute conjunctivitis, bilateral: Secondary | ICD-10-CM | POA: Diagnosis not present

## 2022-12-07 DIAGNOSIS — I4819 Other persistent atrial fibrillation: Secondary | ICD-10-CM | POA: Diagnosis not present

## 2022-12-07 DIAGNOSIS — R911 Solitary pulmonary nodule: Secondary | ICD-10-CM | POA: Diagnosis not present

## 2022-12-11 ENCOUNTER — Telehealth: Payer: Self-pay | Admitting: Nurse Practitioner

## 2022-12-11 ENCOUNTER — Telehealth: Payer: Self-pay | Admitting: Pharmacist

## 2022-12-11 ENCOUNTER — Encounter: Payer: Self-pay | Admitting: Nurse Practitioner

## 2022-12-11 ENCOUNTER — Ambulatory Visit: Payer: Medicare Other | Attending: Nurse Practitioner | Admitting: Nurse Practitioner

## 2022-12-11 VITALS — BP 138/57 | HR 78 | Ht 65.0 in | Wt 232.6 lb

## 2022-12-11 DIAGNOSIS — I10 Essential (primary) hypertension: Secondary | ICD-10-CM

## 2022-12-11 DIAGNOSIS — I50810 Right heart failure, unspecified: Secondary | ICD-10-CM

## 2022-12-11 DIAGNOSIS — I4891 Unspecified atrial fibrillation: Secondary | ICD-10-CM | POA: Diagnosis not present

## 2022-12-11 DIAGNOSIS — Z87898 Personal history of other specified conditions: Secondary | ICD-10-CM

## 2022-12-11 DIAGNOSIS — N183 Chronic kidney disease, stage 3 unspecified: Secondary | ICD-10-CM | POA: Diagnosis not present

## 2022-12-11 DIAGNOSIS — E669 Obesity, unspecified: Secondary | ICD-10-CM

## 2022-12-11 DIAGNOSIS — I2510 Atherosclerotic heart disease of native coronary artery without angina pectoris: Secondary | ICD-10-CM

## 2022-12-11 DIAGNOSIS — E785 Hyperlipidemia, unspecified: Secondary | ICD-10-CM

## 2022-12-11 DIAGNOSIS — I2781 Cor pulmonale (chronic): Secondary | ICD-10-CM

## 2022-12-11 DIAGNOSIS — I272 Pulmonary hypertension, unspecified: Secondary | ICD-10-CM | POA: Diagnosis not present

## 2022-12-11 MED ORDER — BLOOD PRESSURE MONITOR DEVI: 1.0000 | Freq: Every day | 0 refills | Status: AC

## 2022-12-11 MED ORDER — BLOOD PRESSURE MONITOR DEVI: 1.0000 | Freq: Every day | 0 refills | Status: DC

## 2022-12-11 NOTE — Telephone Encounter (Signed)
Pt is requesting a callback regard BP cuff. Please advise

## 2022-12-11 NOTE — Telephone Encounter (Signed)
Rx for BP cuff re sent to [facility]

## 2022-12-11 NOTE — Telephone Encounter (Signed)
Sandra Schneider is out of the office until 8/1. Called pt back and left message. Of note, she should be taking Mounjaro not Ozempic.

## 2022-12-11 NOTE — Telephone Encounter (Signed)
Pt called in asking to speak to [facility], [facility]. She states she was told to c/b after the third week of her taking the ozempic injection

## 2022-12-11 NOTE — Patient Instructions (Addendum)
Medication Instructions:  Your physician recommends that you continue on your current medications as directed. Please refer to the Current Medication list given to you today.  Labwork: None  Testing/Procedures: None  Follow-Up: Your physician recommends that you schedule a follow-up appointment in: 4 Months with Philis Nettle   Any Other Special Instructions Will Be Listed Below (If Applicable).   If you need a refill on your cardiac medications before your next appointment, please call your pharmacy.

## 2022-12-11 NOTE — Progress Notes (Signed)
Cardiology Office Note:  .   Date:  12/11/2022  ID:  Sandra Schneider, DOB [birth year], MRN [id number] PCP: Wilmon Pali, FNP  [facility] Providers Cardiologist:  Dina Rich, MD    History of Present Illness: .   Sandra Schneider is a 67 y.o. female with a PMH of CAD, HTN, HLD, OSA (followed by Pulmonology), hx of breast cancer, s/p lumpectomy and SHOB, pulmonary HTN, A-fib, and severe obesity, who presents today for 4 month follow-up.   Last seen by Dr. Dina Rich on January 27, 2022. Was set up for [facility] based on severe pulmonary HTN seen on TTE. Had workup for interstitial lung disease by pulmonology with CT chest that was negative.  PET scan negative, however she did have evidence of right upper lobe lung nodule.  Colonoscopy mammogram were negative.  Has also been followed by CHF clinic. See Cardiac MRI results below.  Hospital admission 09/2022 for acute on chronic diastolic CHF, severe RV failure, pulmonary HTN, and persistent A-fib. Underwent RHC on Sep 28, 2022 that revealed markedly elevated right heart filling pressures with low but not markedly low PAPi, mildly elevated PCWP, severe PAH, low CO. Started on Lasix and Milrinone for end-stage PAH/core pulmonale d/t OSA/OHS and Group 1 PAH-diuresed aggressively. Started on sildenafil. 5/18 failed DCCV (TEE) 5/24 DCCV > SR. Underwent repeat RHC on 10/31/2022- see results below.   Last seen by Dr. Gasper Lloyd on November 17, 2022. Torsemide was increased to 60 mg BID with uptravi initiated.   Today she presents for follow-up. She states she is doing well. Her breathing status remains stable, wears 4 liters of oxygen continuously. Weight remains stable. Denies any chest pain, worsening shortness of breath, palpitations, syncope, presyncope, dizziness, orthopnea, PND, swelling or significant weight changes, acute bleeding, or claudication. Compliant with her medications. SBP runs from 111- 140's.   Studies Reviewed: Marland Kitchen      RHC 10/31/2022:  IMPRESSION: Severely elevated pre capillary filling pressures, PA mean and PVR consistent with Group 1 + 3 PH Normal post -capillary filling pressures Preserved cardiac output / cardiac index.  Preserved RV function.  CMRI 09/14/2022: IMPRESSION: 1. Normal LV size with vigorous systolic function, EF 75%. No wall motion abnormalities.   2. Visually, the RV appears moderately dilated but it measures mildly dilated. Low normal systolic function, EF 50%.   3.  Moderate TR with regurgitant fraction 26%.   4. Difficult LGE images, but there appears to be a noncoronary-type mid-wall LGE pattern involving the basal anteroseptal, basal to mid inferoseptal, and basal to mid inferolateral wall segments. This suggests prior myocarditis versus possible cardiac sarcoidosis and merits further workup.   5. Elevated extracellular volume percentage suggesting increased myocardial fibrotic content. Not quite to the level that suggests cardiac amyloidosis.  Echo complete 07/2022: 1. Left ventricular ejection fraction, by estimation, is 60 to 65%. The  left ventricle has normal function. The left ventricle has no regional  wall motion abnormalities. Left ventricular diastolic parameters are  consistent with Grade I diastolic  dysfunction (impaired relaxation).   2. Right ventricular systolic function is severely reduced. The right  ventricular size is severely enlarged. There is severely elevated  pulmonary artery systolic pressure.   3. Left atrial size was mildly dilated.   4. Right atrial size was moderately dilated.   5. The mitral valve is abnormal. Trivial mitral valve regurgitation. No  evidence of mitral stenosis. Severe mitral annular calcification.   6. Tricuspid valve regurgitation is severe.  7. The aortic valve is tricuspid. There is mild calcification of the  aortic valve. Aortic valve regurgitation is not visualized. Aortic valve  sclerosis is present, with no  evidence of aortic valve stenosis.   8. The inferior vena cava is dilated in size with <50% respiratory  variability, suggesting right atrial pressure of 15 mmHg.   9. Agitated saline contrast bubble study was negative, with no evidence  of any interatrial shunt.  Risk Assessment/Calculations:    CHA2DS2-VASc Score = 4   This indicates a 4.8% annual risk of stroke. The patient's score is based upon:  Physical Exam:   VS:  BP (!) 138/57 (BP Location: Left Arm, Patient Position: Sitting, Cuff Size: Normal)   Pulse 78   Ht 5\' 5"  (1.651 m)   Wt 232 lb 9.6 oz (105.5 kg)   LMP 01/14/2011   SpO2 96%   BMI 38.71 kg/m    Wt Readings from Last 3 Encounters:  12/11/22 232 lb 9.6 oz (105.5 kg)  11/17/22 237 lb 3.2 oz (107.6 kg)  11/15/22 236 lb (107 kg)    GEN: Obese, 67 y.o. female in no acute distress NECK: No JVD; No carotid bruits CARDIAC: S1/S2, RRR, no murmurs, rubs, gallops RESPIRATORY:  Clear to auscultation without rales, wheezing or rhonchi  ABDOMEN: Soft, non-tender, non-distended EXTREMITIES:  No edema; No deformity   ASSESSMENT AND PLAN: .    RV Failure, Cor pulmonale, Pulmonary HTN Stage C-D, NYHA class II symptoms. EF 60-65% Euvolemic and well compensated on exam. Weight appears stable. Continue Torsemide and potassium supplement. Continue revatio. Low sodium diet, fluid restriction <2L, and daily weights encouraged. Educated to contact our office for weight gain of 2 lbs overnight or 5 lbs in one week. Continue to follow-up with HF clinic and Pulmonology.  CAD Remote PCI LAD and Lcx. Stable with no anginal symptoms. No indication for ischemic evaluation. Not on Aspirin d/t being on Eliquis. Continue atorvastatin. Heart healthy diet and regular cardiovascular exercise encouraged.   HLD LDL 44 07/2022. Continue atorvastatin. Heart healthy diet encouraged.   HTN SBP ranges from 111  - 140 at home. Discussed SBP goal < 140. No medication changes at this time. Will write Rx  for BP cuff. Discussed to monitor BP at home at least 2 hours after medications and sitting for 5-10 minutes. Heart healthy diet encouraged.   A-fib Denies any tachycardia or palpitations. S/P DCCV -> SR. Continue Amiodarone and Eliquis, currently on appropriate dosage and denies any bleeding issues. Heart healthy diet encouraged.   6. CKD stage 3 Most recent labs reveal sCr 1.69 with eGFR at 33. Avoid nephrotoxic agents. Encouraged adequate hydration. Continue to follow with PCP.  7. Hx of syncope Denies any recurrence. This was suspected d/t to severe PH/RV failure without evidence of arrhythmias noted on telemetry. Care and ED precautions discussed. No medication changes at this time. Continue to follow with PCP.  8. Obesity Weight loss via diet and exercise encouraged. Discussed the impact being overweight would have on cardiovascular risk.    Dispo: Follow-up with me or APP in 4 months or sooner if anything changes.   Signed, Sharlene Dory, NP

## 2022-12-12 MED ORDER — MOUNJARO 5 MG/0.5ML ~~LOC~~ SOAJ: 5.0000 mg | SUBCUTANEOUS | 0 refills | Status: DC

## 2022-12-12 NOTE — Telephone Encounter (Signed)
Called pt again, she gives her 4th Mounjaro dose tomorrow. Tolerating med well so far, no GI upset. No major appetite changes yet, maybe down 1-2 lbs. Glucose running in the 89 range. Reports her A1c was checked at PCP office last week and was 5.5%. Will stop her glipizide. Will increase Mounjaro to 5mg  weekly for next month.

## 2022-12-13 DIAGNOSIS — H16143 Punctate keratitis, bilateral: Secondary | ICD-10-CM | POA: Diagnosis not present

## 2022-12-21 DIAGNOSIS — H16143 Punctate keratitis, bilateral: Secondary | ICD-10-CM | POA: Diagnosis not present

## 2022-12-21 DIAGNOSIS — E113311 Type 2 diabetes mellitus with moderate nonproliferative diabetic retinopathy with macular edema, right eye: Secondary | ICD-10-CM | POA: Diagnosis not present

## 2022-12-21 DIAGNOSIS — H40053 Ocular hypertension, bilateral: Secondary | ICD-10-CM | POA: Diagnosis not present

## 2022-12-21 DIAGNOSIS — E113392 Type 2 diabetes mellitus with moderate nonproliferative diabetic retinopathy without macular edema, left eye: Secondary | ICD-10-CM | POA: Diagnosis not present

## 2022-12-21 NOTE — H&P (View-Only) (Signed)
 ADVANCED HEART FAILURE CLINIC NOTE  Referring Physician: Wilmon Pali, FNP  Primary Care: Sandra Pali, FNP Primary Cardiologist: Dr. Dina Schneider  HPI: Sandra Schneider is a 67 y.o. female with CAD (NSTEMI 06/2012 w/ PCI to LAD and Lcx) LVEF 55% at that time, OSA, HTN, hyperlipidemia, hx of breast ca s/p lumpectomy and persistent shortness of breath and lower extremity edema presenting today for follow up. She also follows Sandra Schneider for OSA.  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  She has noticed that over the past several months she has had worsening lower extremity edema and difficulty ambulating due to shortness of breath.  She had a echocardiogram concerning for pulmonary hypertension followed by right heart cath with a mean PA of 69 mmHg.  Her cardiac output was 7 L/min during that case.  She had a workup for interstitial lung disease by pulmonology with CT chest that was negative.  However there was a right upper lobe lung nodule with negative PET scan.  In addition she has had a colonoscopy and mammogram due to hypermetabolic areas in the colon and breast both of which were also negative.   Since her initial appointment we started her on sildenafil 20 mg 3 times daily.  Unfortunately due to continued dietary indiscretion, significant volume intake and medication noncompliance she presented severely hypervolemic during her last appointment in May 2024.  She was directly admitted to the hospital and diuresed 35 pounds during that admission.  Right heart cath confirmed severe precapillary pulmonary hypertension.  Sildenafil was increased to 40 mg 3 times daily.  Interval hx:  Since our last appointment, we have also started opsumit 10mg  She has had continued improvement in functional status. She can now walk further and reports that she "feels like living again". She is doing light weight lifting at home and attempting to go on walks around the  neighborhood.   Activity level/exercise tolerance: NYHA IIB-III, improving.  Orthopnea:  Sleeps on 1; previously 3. Uses CPAP.  Paroxysmal noctural dyspnea: Yes Chest pain/pressure:  No Orthostatic lightheadedness:  No Palpitations:  No Lower extremity edema:  Minimal Presyncope/syncope:  No Cough:  Infrequent but yes  Past Medical History:  Diagnosis Date   Breast cancer (HCC)    Coronary artery disease    Diabetes mellitus    GERD (gastroesophageal reflux disease)    Hypertension    Myocardial abscess    Myocardial infarct (HCC) 06/24/2012   Pulmonary hypertension (HCC)     Current Outpatient Medications  Medication Sig Dispense Refill   acetaminophen (TYLENOL) 325 MG tablet Take 650 mg by mouth every 6 (six) hours as needed for moderate pain.     amiodarone (PACERONE) 200 MG tablet Take 1 tablet (200 mg total) by mouth daily. 30 tablet 5   amLODipine (NORVASC) 5 MG tablet Take 1 tablet (5 mg total) by mouth daily. 90 tablet 3   apixaban (ELIQUIS) 5 MG TABS tablet Take 1 tablet (5 mg total) by mouth 2 (two) times daily. 60 tablet 5   Ascorbic Acid (VITAMIN C PO) Take 1 tablet by mouth daily.     atorvastatin (LIPITOR) 40 MG tablet Take 1 tablet by mouth once daily 90 tablet 2   Blood Pressure Monitor DEVI 1 each by Does not apply route daily. 1 each 0   Blood Pressure Monitor DEVI 1 each by Does not apply route daily. 1 each 0   carboxymethylcellulose (REFRESH TEARS) 0.5 % SOLN Place  1 drop into both eyes daily as needed (dry eyes).     cetirizine (ZYRTEC) 10 MG tablet Take 10 mg by mouth daily.     Cholecalciferol (VITAMIN D) 50 MCG (2000 UT) tablet Take 2,000 Units by mouth daily.     dorzolamide-timolol (COSOPT) 22.3-6.8 MG/ML ophthalmic solution Place 1 drop into both eyes 2 (two) times daily.     famotidine (PEPCID) 20 MG tablet Take 20 mg by mouth daily.     glipiZIDE (GLUCOTROL) 5 MG tablet Take 5 mg by mouth at bedtime.     letrozole (FEMARA) 2.5 MG tablet Take 2.5  mg by mouth daily.     macitentan (OPSUMIT) 10 MG tablet Take 10 mg by mouth daily.     Multiple Vitamin (MULTIVITAMIN) tablet Take 1 tablet by mouth daily.     potassium chloride (KLOR-CON M) 10 MEQ tablet Take 2 tablets (20 mEq total) by mouth daily. 60 tablet 11   sildenafil (REVATIO) 20 MG tablet Take 2 tablets (40 mg total) by mouth 3 (three) times daily. 180 tablet 5   tirzepatide (MOUNJARO) 5 MG/0.5ML Pen Inject 5 mg into the skin once a week. 2 mL 0   torsemide (DEMADEX) 20 MG tablet Take 2 tablets (40 mg total) by mouth 2 (two) times daily. 120 tablet 5   Current Facility-Administered Medications  Medication Dose Route Frequency Provider Last Rate Last Admin   sodium chloride flush (NS) 0.9 % injection 3 mL  3 mL Intravenous Q12H Branch, Dorothe Pea, MD        No Known Allergies    Social History   Socioeconomic History   Marital status: Widowed    Spouse name: Not on file   Number of children: Not on file   Years of education: Not on file   Highest education level: Not on file  Occupational History   Not on file  Tobacco Use   Smoking status: Never    Passive exposure: Never   Smokeless tobacco: Never  Vaping Use   Vaping status: Never Used  Substance and Sexual Activity   Alcohol use: No   Drug use: No   Sexual activity: Yes    Birth control/protection: Post-menopausal  Other Topics Concern   Not on file  Social History Narrative   Not on file   Social Determinants of Health   Financial Resource Strain: Low Risk  (08/22/2022)   Received from [facility], [facility]   Overall Financial Resource Strain (CARDIA)    Difficulty of Paying Living Expenses: Not hard at all  Food Insecurity: No Food Insecurity (09/27/2022)   Hunger Vital Sign    Worried About Running Out of Food in the Last Year: Never true    Ran Out of Food in the Last Year: Never true  Transportation Needs: No Transportation Needs (09/27/2022)   PRAPARE - Scientist, research (physical sciences) (Medical): No    Lack of Transportation (Non-Medical): No  Physical Activity: Insufficiently Active (08/22/2022)   Received from [facility], [facility]   Exercise Vital Sign    Days of Exercise per Week: 3 days    Minutes of Exercise per Session: 30 min  Stress: No Stress Concern Present (08/22/2022)   Received from [facility], [facility] - Occupational Stress Questionnaire    Feeling of Stress : Only a little  Social Connections: Moderately Isolated (08/22/2022)   Received from [facility]  Care, [facility]   Social Connection and Isolation Panel [NHANES]    Frequency of Communication with Friends and Family: More than three times a week    Frequency of Social Gatherings with Friends and Family: More than three times a week    Attends Religious Services: More than 4 times per year    Active Member of [employer] or Organizations: No    Attends Banker Meetings: Never    Marital Status: Widowed  Intimate Partner Violence: Not At Risk (09/27/2022)   Humiliation, Afraid, Rape, and Kick questionnaire    Fear of Current or Ex-Partner: No    Emotionally Abused: No    Physically Abused: No    Sexually Abused: No      Family History  Problem Relation Age of Onset   Heart disease Father    Diabetes Other    Hypertension Other    Cancer Other    Colon cancer Son 22       alive, doing well.    PHYSICAL EXAM: Vitals:   12/22/22 0903  BP: 118/70  Pulse: 69  SpO2: 100%   GENERAL: NAD on [place] HEENT: Negative for arcus senilis or xanthelasma. There is no scleral icterus.  The mucous membranes are pink and moist.   NECK: Supple, No masses. Normal carotid upstrokes without bruits. No masses or thyromegaly.    CHEST: There are no chest wall deformities. There is no chest wall tenderness. Respirations are unlabored.  Lungs- CTA B/L CARDIAC:  JVP: 7 cm          Normal rate with regular rhythm. No murmurs,  rubs or gallops.  Pulses are 2+ and symmetrical in upper and lower extremities. minimal edema.  ABDOMEN: Soft, non-tender, non-distended. There are no masses or hepatomegaly. There are normal bowel sounds.  EXTREMITIES: Warm and well perfused with no cyanosis, clubbing.  LYMPHATIC: No axillary or supraclavicular lymphadenopathy.  NEUROLOGIC: Patient is oriented x3 with no focal or lateralizing neurologic deficits.  PSYCH: Patients affect is appropriate, there is no evidence of anxiety or depression.  SKIN: Warm and dry; no lesions or wounds.     DATA REVIEW  ECG:06/13/22: NSR with 1AVB  ECHO: 01/18/22: LVBEF 60-65%. Moderately dilated RV with mild reduction in function. Dilated RA.  CATH: 02/08/23:   Prox Cx to Dist Cx lesion is 25% stenosed.   Mid LAD lesion is 25% stenosed.   2nd Mrg lesion is 100% stenosed.  Right to left collaterals.  Left to left collaterals.   The left ventricular systolic function is normal.   LV end diastolic pressure is mildly elevated.   The left ventricular ejection fraction is greater than 65% by visual estimate.   Hemodynamic findings consistent with severe pulmonary hypertension.   There is no aortic valve stenosis.   Aortic saturation 96% on 3 L nasal cannula, PA saturation 69% on 3 L, mean RA pressure 32 mm Hg; PA pressure 107/48, mean PA pressure 69 mmHg, pulmonary capillary wedge pressure difficult to assess due to significant V waves, and difficulty wedging.  Cardiac output 7.03 L/min, cardiac index 3.07.   Patent stents with mild restenosis in the LAD and circumflex.  Small obtuse marginal appears occluded distally with some right to left collaterals.  No target for PCI.  RHC 09/28/22: RA 32 RV 95/32 PA 93/44, mean 66 PCWP mean 19  Oxygen saturations: PA 49% AO 96%  Cardiac Output (Fick) 3.98  Cardiac Index (Fick) 1.73 PVR 11.8 PAPi 1.5   10/31/22:  HEMODYNAMICS: RA:                  10 mmHg (mean) RV:                  104/3-10 mmHg PA:                   100/27 mmHg (57 mean) PCWP:            9 mmHg (mean)                                      Estimated Fick CO/CI   6.7 L/min, 3.15 L/min/m2                                            TPG                 48  mmHg                                            PVR                 7 Wood Units  PAPi                10.4    PFTS: 03/21/22: 03/2022 moderate restriction, ratio 84, FEV1 65%, FVC 59%, TLC 74%, DLCO 13.6/64%.  ERV 8% with wt 275   12/22/22: :  Pt ambulated 778ft (228.51m) O2 Sat ranged 100-95% on 4L oxygen HR ranged 71-88  ASSESSMENT & PLAN:  Pulmonary hypertension, Group I + III - Initial RHC waveforms reviewed personally. RA 30, RV 107/20-30, PA 107/48 (69), unable to wedge; LVEDP 15. AO 140/64. LVEDP ~16. Assuming a PCWP of 20, TPG would be 49 w/ PVR of 7.  - Hemodynamics consistent with severe pulmonary hypertension largely due to pre-capillary PH.  -Cardiac MRI in 5/24 with EF 75%, moderate RV dilation with RV EF 50%, moderate TR, mid-wall basal septal/basal inferolateral LGE. Could be seen with cardiac sarcoidosis (vs prior myocarditis). CT chest showed RUL nodule (negative on PET) and mediastinal LAN. V/Q scan not suggestive of chronic PEs. RHC 5/16 showed severe PAH with low CI 1.73, PAPi 1.5, RA pressure 32 (R>>L heart failure). TEE this admission with LV EF 60-65%, D-shaped septum, moderate RV dilation with moderate RV dysfunction, mod-severe TR. Likely end-stage PH/cor pulmonale d/t OHS/OSA and group 1 PAH.  - Labs: ANA, HCV negative. HIV pending. LFTs mostly unremarkable.  - PFTs w/ moderate restriction.  - BNP previously elevated.  - CT chest w/ pulmonary nodules, however, follow up PET mostly unremarkable.  - Currently on sildenafil 40mg  TID & opsumit.  -Mildy hypervolemic; will increase torsemide to 60mg  BID x 2-3 days then continue with 40mg  BID. - Follow up in 34m with pharmacy. 41m with me.  - Repeat RHC with plan to add on either uptravi or  sotatercept - today, see above.  - Trial of Anoro.   2. OSA - followed by pulmonology.  - reports compliance with bipap  3. CAD - LHC as above - followed by general cardiology.   4. Severe obesity - BMI 48.54 today - discussed importance of  weight loss.    Aditya Sabharwal Advanced Heart Failure Mechanical Circulatory Support

## 2022-12-21 NOTE — Progress Notes (Addendum)
ADVANCED HEART FAILURE CLINIC NOTE  Referring Physician: Wilmon Pali, FNP  Primary Care: Wilmon Pali, FNP Primary Cardiologist: Dr. Dina Rich  HPI: Sandra Schneider is a 67 y.o. female with CAD (NSTEMI 06/2012 w/ PCI to LAD and Lcx) LVEF 55% at that time, OSA, HTN, hyperlipidemia, hx of breast ca s/p lumpectomy and persistent shortness of breath and lower extremity edema presenting today for follow up. She also follows Dr. Vassie Loll for OSA.  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  She has noticed that over the past several months she has had worsening lower extremity edema and difficulty ambulating due to shortness of breath.  She had a echocardiogram concerning for pulmonary hypertension followed by right heart cath with a mean PA of 69 mmHg.  Her cardiac output was 7 L/min during that case.  She had a workup for interstitial lung disease by pulmonology with CT chest that was negative.  However there was a right upper lobe lung nodule with negative PET scan.  In addition she has had a colonoscopy and mammogram due to hypermetabolic areas in the colon and breast both of which were also negative.   Since her initial appointment we started her on sildenafil 20 mg 3 times daily.  Unfortunately due to continued dietary indiscretion, significant volume intake and medication noncompliance she presented severely hypervolemic during her last appointment in May 2024.  She was directly admitted to the hospital and diuresed 35 pounds during that admission.  Right heart cath confirmed severe precapillary pulmonary hypertension.  Sildenafil was increased to 40 mg 3 times daily.  Interval hx:  Since our last appointment, we have also started opsumit 10mg  She has had continued improvement in functional status. She can now walk further and reports that she "feels like living again". She is doing light weight lifting at home and attempting to go on walks around the  neighborhood.   Activity level/exercise tolerance: NYHA IIB-III, improving.  Orthopnea:  Sleeps on 1; previously 3. Uses CPAP.  Paroxysmal noctural dyspnea: Yes Chest pain/pressure:  No Orthostatic lightheadedness:  No Palpitations:  No Lower extremity edema:  Minimal Presyncope/syncope:  No Cough:  Infrequent but yes  Past Medical History:  Diagnosis Date   Breast cancer (HCC)    Coronary artery disease    Diabetes mellitus    GERD (gastroesophageal reflux disease)    Hypertension    Myocardial abscess    Myocardial infarct (HCC) 06/24/2012   Pulmonary hypertension (HCC)     Current Outpatient Medications  Medication Sig Dispense Refill   acetaminophen (TYLENOL) 325 MG tablet Take 650 mg by mouth every 6 (six) hours as needed for moderate pain.     amiodarone (PACERONE) 200 MG tablet Take 1 tablet (200 mg total) by mouth daily. 30 tablet 5   amLODipine (NORVASC) 5 MG tablet Take 1 tablet (5 mg total) by mouth daily. 90 tablet 3   apixaban (ELIQUIS) 5 MG TABS tablet Take 1 tablet (5 mg total) by mouth 2 (two) times daily. 60 tablet 5   Ascorbic Acid (VITAMIN C PO) Take 1 tablet by mouth daily.     atorvastatin (LIPITOR) 40 MG tablet Take 1 tablet by mouth once daily 90 tablet 2   Blood Pressure Monitor DEVI 1 each by Does not apply route daily. 1 each 0   Blood Pressure Monitor DEVI 1 each by Does not apply route daily. 1 each 0   carboxymethylcellulose (REFRESH TEARS) 0.5 % SOLN Place  1 drop into both eyes daily as needed (dry eyes).     cetirizine (ZYRTEC) 10 MG tablet Take 10 mg by mouth daily.     Cholecalciferol (VITAMIN D) 50 MCG (2000 UT) tablet Take 2,000 Units by mouth daily.     dorzolamide-timolol (COSOPT) 22.3-6.8 MG/ML ophthalmic solution Place 1 drop into both eyes 2 (two) times daily.     famotidine (PEPCID) 20 MG tablet Take 20 mg by mouth daily.     glipiZIDE (GLUCOTROL) 5 MG tablet Take 5 mg by mouth at bedtime.     letrozole (FEMARA) 2.5 MG tablet Take 2.5  mg by mouth daily.     macitentan (OPSUMIT) 10 MG tablet Take 10 mg by mouth daily.     Multiple Vitamin (MULTIVITAMIN) tablet Take 1 tablet by mouth daily.     potassium chloride (KLOR-CON M) 10 MEQ tablet Take 2 tablets (20 mEq total) by mouth daily. 60 tablet 11   sildenafil (REVATIO) 20 MG tablet Take 2 tablets (40 mg total) by mouth 3 (three) times daily. 180 tablet 5   tirzepatide (MOUNJARO) 5 MG/0.5ML Pen Inject 5 mg into the skin once a week. 2 mL 0   torsemide (DEMADEX) 20 MG tablet Take 2 tablets (40 mg total) by mouth 2 (two) times daily. 120 tablet 5   Current Facility-Administered Medications  Medication Dose Route Frequency Provider Last Rate Last Admin   sodium chloride flush (NS) 0.9 % injection 3 mL  3 mL Intravenous Q12H Branch, Dorothe Pea, MD        No Known Allergies    Social History   Socioeconomic History   Marital status: Widowed    Spouse name: Not on file   Number of children: Not on file   Years of education: Not on file   Highest education level: Not on file  Occupational History   Not on file  Tobacco Use   Smoking status: Never    Passive exposure: Never   Smokeless tobacco: Never  Vaping Use   Vaping status: Never Used  Substance and Sexual Activity   Alcohol use: No   Drug use: No   Sexual activity: Yes    Birth control/protection: Post-menopausal  Other Topics Concern   Not on file  Social History Narrative   Not on file   Social Determinants of Health   Financial Resource Strain: Low Risk  (08/22/2022)   Received from [facility], [facility]   Overall Financial Resource Strain (CARDIA)    Difficulty of Paying Living Expenses: Not hard at all  Food Insecurity: No Food Insecurity (09/27/2022)   Hunger Vital Sign    Worried About Running Out of Food in the Last Year: Never true    Ran Out of Food in the Last Year: Never true  Transportation Needs: No Transportation Needs (09/27/2022)   PRAPARE - Scientist, research (physical sciences) (Medical): No    Lack of Transportation (Non-Medical): No  Physical Activity: Insufficiently Active (08/22/2022)   Received from [facility], [facility]   Exercise Vital Sign    Days of Exercise per Week: 3 days    Minutes of Exercise per Session: 30 min  Stress: No Stress Concern Present (08/22/2022)   Received from [facility], [facility] - Occupational Stress Questionnaire    Feeling of Stress : Only a little  Social Connections: Moderately Isolated (08/22/2022)   Received from [facility]  Care, [facility]   Social Connection and Isolation Panel [NHANES]    Frequency of Communication with Friends and Family: More than three times a week    Frequency of Social Gatherings with Friends and Family: More than three times a week    Attends Religious Services: More than 4 times per year    Active Member of [employer] or Organizations: No    Attends Banker Meetings: Never    Marital Status: Widowed  Intimate Partner Violence: Not At Risk (09/27/2022)   Humiliation, Afraid, Rape, and Kick questionnaire    Fear of Current or Ex-Partner: No    Emotionally Abused: No    Physically Abused: No    Sexually Abused: No      Family History  Problem Relation Age of Onset   Heart disease Father    Diabetes Other    Hypertension Other    Cancer Other    Colon cancer Son 22       alive, doing well.    PHYSICAL EXAM: Vitals:   12/22/22 0903  BP: 118/70  Pulse: 69  SpO2: 100%   GENERAL: NAD on [place] HEENT: Negative for arcus senilis or xanthelasma. There is no scleral icterus.  The mucous membranes are pink and moist.   NECK: Supple, No masses. Normal carotid upstrokes without bruits. No masses or thyromegaly.    CHEST: There are no chest wall deformities. There is no chest wall tenderness. Respirations are unlabored.  Lungs- CTA B/L CARDIAC:  JVP: 7 cm          Normal rate with regular rhythm. No murmurs,  rubs or gallops.  Pulses are 2+ and symmetrical in upper and lower extremities. minimal edema.  ABDOMEN: Soft, non-tender, non-distended. There are no masses or hepatomegaly. There are normal bowel sounds.  EXTREMITIES: Warm and well perfused with no cyanosis, clubbing.  LYMPHATIC: No axillary or supraclavicular lymphadenopathy.  NEUROLOGIC: Patient is oriented x3 with no focal or lateralizing neurologic deficits.  PSYCH: Patients affect is appropriate, there is no evidence of anxiety or depression.  SKIN: Warm and dry; no lesions or wounds.     DATA REVIEW  ECG:06/13/22: NSR with 1AVB  ECHO: 01/18/22: LVBEF 60-65%. Moderately dilated RV with mild reduction in function. Dilated RA.  CATH: 02/08/23:   Prox Cx to Dist Cx lesion is 25% stenosed.   Mid LAD lesion is 25% stenosed.   2nd Mrg lesion is 100% stenosed.  Right to left collaterals.  Left to left collaterals.   The left ventricular systolic function is normal.   LV end diastolic pressure is mildly elevated.   The left ventricular ejection fraction is greater than 65% by visual estimate.   Hemodynamic findings consistent with severe pulmonary hypertension.   There is no aortic valve stenosis.   Aortic saturation 96% on 3 L nasal cannula, PA saturation 69% on 3 L, mean RA pressure 32 mm Hg; PA pressure 107/48, mean PA pressure 69 mmHg, pulmonary capillary wedge pressure difficult to assess due to significant V waves, and difficulty wedging.  Cardiac output 7.03 L/min, cardiac index 3.07.   Patent stents with mild restenosis in the LAD and circumflex.  Small obtuse marginal appears occluded distally with some right to left collaterals.  No target for PCI.  RHC 09/28/22: RA 32 RV 95/32 PA 93/44, mean 66 PCWP mean 19  Oxygen saturations: PA 49% AO 96%  Cardiac Output (Fick) 3.98  Cardiac Index (Fick) 1.73 PVR 11.8 PAPi 1.5   10/31/22:  HEMODYNAMICS: RA:                  10 mmHg (mean) RV:                  104/3-10 mmHg PA:                   100/27 mmHg (57 mean) PCWP:            9 mmHg (mean)                                      Estimated Fick CO/CI   6.7 L/min, 3.15 L/min/m2                                            TPG                 48  mmHg                                            PVR                 7 Wood Units  PAPi                10.4    PFTS: 03/21/22: 03/2022 moderate restriction, ratio 84, FEV1 65%, FVC 59%, TLC 74%, DLCO 13.6/64%.  ERV 8% with wt 275   12/22/22: :  Pt ambulated 778ft (228.51m) O2 Sat ranged 100-95% on 4L oxygen HR ranged 71-88  ASSESSMENT & PLAN:  Pulmonary hypertension, Group I + III - Initial RHC waveforms reviewed personally. RA 30, RV 107/20-30, PA 107/48 (69), unable to wedge; LVEDP 15. AO 140/64. LVEDP ~16. Assuming a PCWP of 20, TPG would be 49 w/ PVR of 7.  - Hemodynamics consistent with severe pulmonary hypertension largely due to pre-capillary PH.  -Cardiac MRI in 5/24 with EF 75%, moderate RV dilation with RV EF 50%, moderate TR, mid-wall basal septal/basal inferolateral LGE. Could be seen with cardiac sarcoidosis (vs prior myocarditis). CT chest showed RUL nodule (negative on PET) and mediastinal LAN. V/Q scan not suggestive of chronic PEs. RHC 5/16 showed severe PAH with low CI 1.73, PAPi 1.5, RA pressure 32 (R>>L heart failure). TEE this admission with LV EF 60-65%, D-shaped septum, moderate RV dilation with moderate RV dysfunction, mod-severe TR. Likely end-stage PH/cor pulmonale d/t OHS/OSA and group 1 PAH.  - Labs: ANA, HCV negative. HIV pending. LFTs mostly unremarkable.  - PFTs w/ moderate restriction.  - BNP previously elevated.  - CT chest w/ pulmonary nodules, however, follow up PET mostly unremarkable.  - Currently on sildenafil 40mg  TID & opsumit.  -Mildy hypervolemic; will increase torsemide to 60mg  BID x 2-3 days then continue with 40mg  BID. - Follow up in 34m with pharmacy. 41m with me.  - Repeat RHC with plan to add on either uptravi or  sotatercept - today, see above.  - Trial of Anoro.   2. OSA - followed by pulmonology.  - reports compliance with bipap  3. CAD - LHC as above - followed by general cardiology.   4. Severe obesity - BMI 48.54 today - discussed importance of  weight loss.      Advanced Heart Failure Mechanical Circulatory Support

## 2022-12-22 ENCOUNTER — Ambulatory Visit (HOSPITAL_COMMUNITY)
Admission: RE | Admit: 2022-12-22 | Discharge: 2022-12-22 | Disposition: A | Discharge status: Home / Self Care | Payer: Medicare Other | Source: Ambulatory Visit | Attending: Cardiology | Admitting: Cardiology

## 2022-12-22 ENCOUNTER — Encounter (HOSPITAL_COMMUNITY): Payer: Self-pay | Admitting: Cardiology

## 2022-12-22 VITALS — BP 118/70 | HR 69 | Wt 227.8 lb

## 2022-12-22 DIAGNOSIS — Z955 Presence of coronary angioplasty implant and graft: Secondary | ICD-10-CM | POA: Insufficient documentation

## 2022-12-22 DIAGNOSIS — I272 Pulmonary hypertension, unspecified: Secondary | ICD-10-CM

## 2022-12-22 DIAGNOSIS — R600 Localized edema: Secondary | ICD-10-CM | POA: Insufficient documentation

## 2022-12-22 DIAGNOSIS — Z6841 Body Mass Index (BMI) 40.0 and over, adult: Secondary | ICD-10-CM | POA: Diagnosis not present

## 2022-12-22 DIAGNOSIS — N183 Chronic kidney disease, stage 3 unspecified: Secondary | ICD-10-CM | POA: Diagnosis not present

## 2022-12-22 DIAGNOSIS — I252 Old myocardial infarction: Secondary | ICD-10-CM | POA: Insufficient documentation

## 2022-12-22 DIAGNOSIS — I110 Hypertensive heart disease with heart failure: Secondary | ICD-10-CM | POA: Diagnosis not present

## 2022-12-22 DIAGNOSIS — Z853 Personal history of malignant neoplasm of breast: Secondary | ICD-10-CM | POA: Diagnosis not present

## 2022-12-22 DIAGNOSIS — I2781 Cor pulmonale (chronic): Secondary | ICD-10-CM

## 2022-12-22 DIAGNOSIS — I509 Heart failure, unspecified: Secondary | ICD-10-CM | POA: Diagnosis not present

## 2022-12-22 DIAGNOSIS — G4733 Obstructive sleep apnea (adult) (pediatric): Secondary | ICD-10-CM | POA: Diagnosis not present

## 2022-12-22 DIAGNOSIS — I2510 Atherosclerotic heart disease of native coronary artery without angina pectoris: Secondary | ICD-10-CM | POA: Insufficient documentation

## 2022-12-22 DIAGNOSIS — I2721 Secondary pulmonary arterial hypertension: Secondary | ICD-10-CM | POA: Diagnosis not present

## 2022-12-22 DIAGNOSIS — Z79899 Other long term (current) drug therapy: Secondary | ICD-10-CM | POA: Diagnosis not present

## 2022-12-22 DIAGNOSIS — Z8249 Family history of ischemic heart disease and other diseases of the circulatory system: Secondary | ICD-10-CM | POA: Diagnosis not present

## 2022-12-22 DIAGNOSIS — I50810 Right heart failure, unspecified: Secondary | ICD-10-CM | POA: Diagnosis not present

## 2022-12-22 LAB — CBC
HCT: 30.1 % — ABNORMAL LOW (ref 36.0–46.0)
Hemoglobin: 9.9 g/dL — ABNORMAL LOW (ref 12.0–15.0)
MCH: 32.8 pg (ref 26.0–34.0)
MCHC: 32.9 g/dL (ref 30.0–36.0)
MCV: 99.7 fL (ref 80.0–100.0)
Platelets: 236 10*3/uL (ref 150–400)
RBC: 3.02 MIL/uL — ABNORMAL LOW (ref 3.87–5.11)
RDW: 14.4 % (ref 11.5–15.5)
WBC: 6.5 10*3/uL (ref 4.0–10.5)
nRBC: 0 % (ref 0.0–0.2)

## 2022-12-22 LAB — BASIC METABOLIC PANEL
Anion gap: 11 (ref 5–15)
BUN: 22 mg/dL (ref 8–23)
CO2: 26 mmol/L (ref 22–32)
Calcium: 9.4 mg/dL (ref 8.9–10.3)
Chloride: 100 mmol/L (ref 98–111)
Creatinine, Ser: 1.57 mg/dL — ABNORMAL HIGH (ref 0.44–1.00)
GFR, Estimated: 36 mL/min — ABNORMAL LOW (ref >60)
Glucose, Bld: 99 mg/dL (ref 70–99)
Potassium: 3.8 mmol/L (ref 3.5–5.1)
Sodium: 137 mmol/L (ref 135–145)

## 2022-12-22 LAB — BRAIN NATRIURETIC PEPTIDE: B Natriuretic Peptide: 98.3 pg/mL (ref 0.0–100.0)

## 2022-12-22 NOTE — Patient Instructions (Signed)
Medication Changes:  INCREASE TORSEMIDE TO 60MG  TWICE DAILY FOR 2 DAYS   THEN   RETURN TO 40MG  TWICE DAILY THEREAFTER  Lab Work:  Labs done today, your results will be available in MyChart, we will contact you for abnormal readings.  Referrals:  YOU HAVE BEEN REFERRED TO PULMONARY REHAB THEY WILL REACH OUT TO YOU OR CALL TO ARRANGE THIS. PLEASE CALL us WITH ANY CONCERNS   TESTING  [facility] [address] [place] [address] Dept: [phone] Loc: [phone]  Cecille Fehlman [facility]  12/22/2022  You are scheduled for a Cardiac Catheterization on Friday, August 23 with Dr.  Gasper Lloyd .  1. Please arrive at the [facility] (Main Entrance A) at [facility]: [address] [place], [place] [address] at 7:00 AM (This time is 2 hour(s) before your procedure to ensure your preparation). Free valet parking service is available. You will check in at ADMITTING. The support person will be asked to wait in the waiting room.  It is OK to have someone drop you off and come back when you are ready to be discharged.    Special note: Every effort is made to have your procedure done on time. Please understand that emergencies sometimes delay scheduled procedures.  2. Diet: Do not eat solid foods after midnight.  The patient may have clear liquids until 5am upon the day of the procedure.  3. Labs: You will need to have blood drawn on TODAY  4. Medication instructions in preparation for your procedure:   Contrast Allergy: No  DO NOT TAKE GLIPIZIDE THE MORNING OF PROCEDURE   DO NOT TAKE MOUNJARO DOSE THE WEEK BEFORE PROCEDURE  DO NOT TAKE TORSEMIDE THE MORNING OF PROCEDURE   5. Plan to go home the same day, you will only stay overnight if medically necessary. 6. Bring a current list of your medications and current insurance cards. 7. You MUST have a responsible person to drive you home. 8. Someone  MUST be with you the first 24 hours after you arrive home or your discharge will be delayed. 9. Please wear clothes that are easy to get on and off and wear slip-on shoes.  Thank you for allowing [place] to care for you!   -- [facility]    Follow-Up in: 1 month with pharmacy as scheduled and then 2 months with Dr. Gasper Lloyd as scheduled.   At the Advanced Heart Failure Clinic, you and your health needs are our priority. We have a designated team specialized in the treatment of Heart Failure. This Care Team includes your primary Heart Failure Specialized Cardiologist (physician), Advanced Practice Providers (APPs- Physician Assistants and Nurse Practitioners), and Pharmacist who all work together to provide you with the care you need, when you need it.   You may see any of the following providers on your designated Care Team at your next follow up:  Dr. Arvilla Meres Dr. Marca Ancona Dr. Marcos Eke, NP Robbie Lis, [place] [facility] [place], [place] Brynda Peon, NP Karle Plumber, PharmD  Please be sure to bring in all your medications bottles to every appointment.   Need to Contact us:  If you have any questions or concerns before your next appointment please send [place] a message through [place] or call our office at [phone].    TO LEAVE A MESSAGE FOR THE NURSE SELECT OPTION 2, PLEASE LEAVE A MESSAGE INCLUDING: YOUR NAME  DATE OF BIRTH CALL BACK NUMBER REASON FOR CALL**this is important as we prioritize the call backs  YOU WILL RECEIVE A CALL BACK THE SAME DAY AS LONG AS YOU CALL BEFORE 4:00 PM

## 2022-12-22 NOTE — Progress Notes (Signed)
6 Min Walk Test Completed  Pt ambulated 762ft (228.22m) O2 Sat ranged 100-95% on 4L oxygen HR ranged 71-88

## 2022-12-22 NOTE — Progress Notes (Signed)
ReDS Vest / Clip - 12/22/22 0900       ReDS Vest / Clip   Station Marker B    Ruler Value 34    ReDS Value Range High volume overload    ReDS Actual Value 41

## 2022-12-25 ENCOUNTER — Other Ambulatory Visit (HOSPITAL_COMMUNITY): Payer: Self-pay

## 2022-12-25 DIAGNOSIS — G4733 Obstructive sleep apnea (adult) (pediatric): Secondary | ICD-10-CM | POA: Diagnosis not present

## 2022-12-25 DIAGNOSIS — I272 Pulmonary hypertension, unspecified: Secondary | ICD-10-CM

## 2022-12-25 NOTE — Progress Notes (Signed)
Orders entered for right heart cath

## 2022-12-27 DIAGNOSIS — E113392 Type 2 diabetes mellitus with moderate nonproliferative diabetic retinopathy without macular edema, left eye: Secondary | ICD-10-CM | POA: Diagnosis not present

## 2022-12-27 DIAGNOSIS — E113311 Type 2 diabetes mellitus with moderate nonproliferative diabetic retinopathy with macular edema, right eye: Secondary | ICD-10-CM | POA: Diagnosis not present

## 2022-12-27 DIAGNOSIS — H16143 Punctate keratitis, bilateral: Secondary | ICD-10-CM | POA: Diagnosis not present

## 2022-12-27 DIAGNOSIS — H40053 Ocular hypertension, bilateral: Secondary | ICD-10-CM | POA: Diagnosis not present

## 2023-01-03 ENCOUNTER — Encounter (HOSPITAL_COMMUNITY): Payer: Medicare Other

## 2023-01-05 ENCOUNTER — Ambulatory Visit (HOSPITAL_COMMUNITY)
Admission: RE | Admit: 2023-01-05 | Discharge: 2023-01-05 | Disposition: A | Discharge status: Home / Self Care | Payer: Medicare Other | Source: Home / Self Care | Attending: Cardiology | Admitting: Cardiology

## 2023-01-05 ENCOUNTER — Encounter (HOSPITAL_COMMUNITY)
Admission: RE | Disposition: A | Discharge status: Home / Self Care | Payer: Self-pay | Source: Home / Self Care | Attending: Cardiology

## 2023-01-05 ENCOUNTER — Telehealth (HOSPITAL_COMMUNITY): Payer: Self-pay | Admitting: Pharmacy Technician

## 2023-01-05 ENCOUNTER — Other Ambulatory Visit: Payer: Self-pay

## 2023-01-05 DIAGNOSIS — I272 Pulmonary hypertension, unspecified: Secondary | ICD-10-CM | POA: Diagnosis not present

## 2023-01-05 DIAGNOSIS — E669 Obesity, unspecified: Secondary | ICD-10-CM | POA: Diagnosis not present

## 2023-01-05 DIAGNOSIS — R600 Localized edema: Secondary | ICD-10-CM | POA: Insufficient documentation

## 2023-01-05 DIAGNOSIS — Z853 Personal history of malignant neoplasm of breast: Secondary | ICD-10-CM | POA: Diagnosis not present

## 2023-01-05 DIAGNOSIS — I252 Old myocardial infarction: Secondary | ICD-10-CM | POA: Diagnosis not present

## 2023-01-05 DIAGNOSIS — I10 Essential (primary) hypertension: Secondary | ICD-10-CM | POA: Diagnosis not present

## 2023-01-05 DIAGNOSIS — I2510 Atherosclerotic heart disease of native coronary artery without angina pectoris: Secondary | ICD-10-CM | POA: Diagnosis not present

## 2023-01-05 DIAGNOSIS — Z79899 Other long term (current) drug therapy: Secondary | ICD-10-CM | POA: Insufficient documentation

## 2023-01-05 DIAGNOSIS — G4733 Obstructive sleep apnea (adult) (pediatric): Secondary | ICD-10-CM | POA: Insufficient documentation

## 2023-01-05 DIAGNOSIS — Z955 Presence of coronary angioplasty implant and graft: Secondary | ICD-10-CM | POA: Diagnosis not present

## 2023-01-05 DIAGNOSIS — E785 Hyperlipidemia, unspecified: Secondary | ICD-10-CM | POA: Insufficient documentation

## 2023-01-05 DIAGNOSIS — Z6841 Body Mass Index (BMI) 40.0 and over, adult: Secondary | ICD-10-CM | POA: Insufficient documentation

## 2023-01-05 HISTORY — PX: RIGHT HEART CATH: CATH118263

## 2023-01-05 LAB — POCT I-STAT EG7
Acid-Base Excess: 3 mmol/L — ABNORMAL HIGH (ref 0.0–2.0)
Acid-Base Excess: 3 mmol/L — ABNORMAL HIGH (ref 0.0–2.0)
Bicarbonate: 27.8 mmol/L (ref 20.0–28.0)
Bicarbonate: 28.6 mmol/L — ABNORMAL HIGH (ref 20.0–28.0)
Calcium, Ion: 1.19 mmol/L (ref 1.15–1.40)
Calcium, Ion: 1.2 mmol/L (ref 1.15–1.40)
HCT: 31 % — ABNORMAL LOW (ref 36.0–46.0)
HCT: 32 % — ABNORMAL LOW (ref 36.0–46.0)
Hemoglobin: 10.5 g/dL — ABNORMAL LOW (ref 12.0–15.0)
Hemoglobin: 10.9 g/dL — ABNORMAL LOW (ref 12.0–15.0)
O2 Saturation: 72 %
O2 Saturation: 73 %
Potassium: 3.3 mmol/L — ABNORMAL LOW (ref 3.5–5.1)
Potassium: 3.3 mmol/L — ABNORMAL LOW (ref 3.5–5.1)
Sodium: 141 mmol/L (ref 135–145)
Sodium: 141 mmol/L (ref 135–145)
TCO2: 29 mmol/L (ref 22–32)
TCO2: 30 mmol/L (ref 22–32)
pCO2, Ven: 44.2 mmHg (ref 44–60)
pCO2, Ven: 45 mmHg (ref 44–60)
pH, Ven: 7.407 (ref 7.25–7.43)
pH, Ven: 7.411 (ref 7.25–7.43)
pO2, Ven: 38 mmHg (ref 32–45)
pO2, Ven: 39 mmHg (ref 32–45)

## 2023-01-05 LAB — GLUCOSE, CAPILLARY: Glucose-Capillary: 95 mg/dL (ref 70–99)

## 2023-01-05 SURGERY — RIGHT HEART CATH
Anesthesia: LOCAL

## 2023-01-05 MED ORDER — ONDANSETRON HCL 4 MG/2ML IJ SOLN: 4.0000 mg | Freq: Four times a day (QID) | INTRAMUSCULAR | Status: DC | PRN

## 2023-01-05 MED ORDER — HYDRALAZINE HCL 20 MG/ML IJ SOLN: 10.0000 mg | INTRAMUSCULAR | Status: DC | PRN

## 2023-01-05 MED ORDER — SODIUM CHLORIDE 0.9% FLUSH: 3.0000 mL | Freq: Two times a day (BID) | INTRAVENOUS | Status: DC

## 2023-01-05 MED ORDER — LIDOCAINE HCL (PF) 1 % IJ SOLN
INTRAMUSCULAR | Status: AC
  Filled 2023-01-05: qty 30

## 2023-01-05 MED ORDER — HEPARIN (PORCINE) IN NACL 1000-0.9 UT/500ML-% IV SOLN
INTRAVENOUS | Status: DC | PRN
  Administered 2023-01-05: 500 mL

## 2023-01-05 MED ORDER — ACETAMINOPHEN 325 MG PO TABS: 650.0000 mg | ORAL_TABLET | ORAL | Status: DC | PRN

## 2023-01-05 MED ORDER — SODIUM CHLORIDE 0.9 % IV SOLN: 250.0000 mL | INTRAVENOUS | Status: DC | PRN

## 2023-01-05 MED ORDER — LIDOCAINE HCL (PF) 1 % IJ SOLN
INTRAMUSCULAR | Status: DC | PRN
  Administered 2023-01-05: 2 mL

## 2023-01-05 MED ORDER — SODIUM CHLORIDE 0.9 % IV SOLN: INTRAVENOUS | Status: DC

## 2023-01-05 MED ORDER — SODIUM CHLORIDE 0.9% FLUSH: 3.0000 mL | INTRAVENOUS | Status: DC | PRN

## 2023-01-05 MED ORDER — LABETALOL HCL 5 MG/ML IV SOLN: 10.0000 mg | INTRAVENOUS | Status: DC | PRN

## 2023-01-05 SURGICAL SUPPLY — 5 items
CATH BALLN WEDGE 5F 110CM (CATHETERS) IMPLANT
GUIDEWIRE .025 260CM (WIRE) IMPLANT
PACK CARDIAC CATHETERIZATION (CUSTOM PROCEDURE TRAY) ×1 IMPLANT
SHEATH GLIDE SLENDER 4/5FR (SHEATH) IMPLANT
TUBING ART PRESS 72 MALE/FEM (TUBING) IMPLANT

## 2023-01-05 NOTE — Interval H&P Note (Signed)
History and Physical Interval Note:  01/05/2023 9:49 AM  Sandra Schneider  has presented today for surgery, with the diagnosis of hp.  The various methods of treatment have been discussed with the patient and family. After consideration of risks, benefits and other options for treatment, the patient has consented to  Procedure(s): RIGHT HEART CATH (N/A) as a surgical intervention.  The patient's history has been reviewed, patient examined, no change in status, stable for surgery.  I have reviewed the patient's chart and labs.  Questions were answered to the patient's satisfaction.     [name]

## 2023-01-05 NOTE — Telephone Encounter (Signed)
Advanced Heart Failure Patient Advocate Encounter  Provider would like to start Uptravi. Will send in referral once PA is approved.

## 2023-01-05 NOTE — Discharge Instructions (Signed)

## 2023-01-07 DIAGNOSIS — R911 Solitary pulmonary nodule: Secondary | ICD-10-CM | POA: Diagnosis not present

## 2023-01-07 DIAGNOSIS — I50810 Right heart failure, unspecified: Secondary | ICD-10-CM | POA: Diagnosis not present

## 2023-01-07 DIAGNOSIS — I272 Pulmonary hypertension, unspecified: Secondary | ICD-10-CM | POA: Diagnosis not present

## 2023-01-07 DIAGNOSIS — I4819 Other persistent atrial fibrillation: Secondary | ICD-10-CM | POA: Diagnosis not present

## 2023-01-08 ENCOUNTER — Other Ambulatory Visit (HOSPITAL_COMMUNITY): Payer: Self-pay

## 2023-01-08 ENCOUNTER — Encounter (HOSPITAL_COMMUNITY): Payer: Self-pay | Admitting: Cardiology

## 2023-01-08 ENCOUNTER — Telehealth (HOSPITAL_COMMUNITY): Payer: Self-pay | Admitting: Pharmacy Technician

## 2023-01-08 DIAGNOSIS — M79671 Pain in right foot: Secondary | ICD-10-CM | POA: Diagnosis not present

## 2023-01-08 NOTE — Telephone Encounter (Signed)
Advanced Heart Failure Patient Advocate Encounter  Prior Authorization for Uptravi titration pack has been approved.    PA# [id number] Effective dates: 01/08/23 through 05/15/23  Archer Asa, CPhT

## 2023-01-08 NOTE — Telephone Encounter (Signed)
Patient Advocate Encounter   Received notification from [facility] that prior authorization for Uptravi titration pack is required.   PA submitted on CoverMyMeds Key [id number] Status is pending   Will continue to follow.

## 2023-01-09 ENCOUNTER — Telehealth: Payer: Self-pay | Admitting: Pharmacist

## 2023-01-09 ENCOUNTER — Encounter (HOSPITAL_COMMUNITY)
Admission: RE | Admit: 2023-01-09 | Discharge: 2023-01-09 | Disposition: A | Discharge status: Home / Self Care | Payer: Medicare Other | Source: Ambulatory Visit | Attending: Cardiology | Admitting: Cardiology

## 2023-01-09 DIAGNOSIS — I272 Pulmonary hypertension, unspecified: Secondary | ICD-10-CM | POA: Insufficient documentation

## 2023-01-09 NOTE — Progress Notes (Signed)
Completed virtual orientation today.  EP evaluation is scheduled for 01/17/23 at 8am.  Documentation for diagnosis can be found in [facility] encounter 12/22/22.

## 2023-01-09 NOTE — Telephone Encounter (Signed)
Advanced Heart Failure Patient Advocate Encounter  Referral sent via fax.

## 2023-01-09 NOTE — Telephone Encounter (Signed)
Call to titrate Mounjaro dose from 5 mg to 7.5 mg - N/A LVM to call back on [phone]

## 2023-01-09 NOTE — Progress Notes (Signed)
Advanced Heart Failure Clinic Note   Referring Physician: Wilmon Pali, FNP  Primary Care: Wilmon Pali, FNP Primary Cardiologist: Dr. Dina Rich HF Cardiologist: Dr. Gasper Lloyd  HPI:  Sandra Schneider is a 67 y.o. female with CAD (NSTEMI 06/2012 w/ PCI to LAD and Lcx) LVEF 55% at that time, OSA, HTN, hyperlipidemia, hx of breast ca s/p lumpectomy and persistent shortness of breath and lower extremity edema presenting today for follow up. She also follows Dr. Vassie Loll for OSA.  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  She has noticed that over the past several months she has had worsening lower extremity edema and difficulty ambulating due to shortness of breath.  She had a echocardiogram concerning for pulmonary hypertension followed by right heart cath with a mean PA of 69 mmHg.  Her cardiac output was 7 L/min during that case.  She had a workup for interstitial lung disease by pulmonology with CT chest that was negative.  However there was a right upper lobe lung nodule with negative PET scan.  In addition she has had a colonoscopy and mammogram due to hypermetabolic areas in the colon and breast both of which were also negative.    Since her initial appointment we started her on sildenafil 20 mg 3 times daily.  Unfortunately due to continued dietary indiscretion, significant volume intake and medication noncompliance she presented severely hypervolemic during her last appointment in May 2024.  She was directly admitted to the hospital and diuresed 35 pounds during that admission.  Right heart cath confirmed severe precapillary pulmonary hypertension.  Sildenafil was increased to 40 mg 3 times daily.   Presented to [facility] Clinic 12/22/22. Since her last appointment, she had been started on Opsumit. She had continued improvement in functional status. She could walk further and reported that she "feels like living again". She was doing light weight lifting at  home and was attempting to go on walks around the neighborhood.   Today she returns to HF clinic for pharmacist medication titration. At last visit with MD she was instructed to take torsemide 60 mg BID x2-3 days, then resume 40 mg BID. RHC was completed 01/05/23, mPAP 30 mmHg, PCWP 9 mmHg, PVR4 WU. The enrollment process for Sandra Schneider was initiated. Overall she is feeling well today. Only issue she has had lately was pain in her right foot. Imaging was done and nothing was broken. Per patient, physician thought she had gout and gave her a course of prednisone. Pain has now resolved and she finished course of prednisone 2 weeks ago. Has occasional dizziness when outside in the heat. Episodes only last ~1 minute and she just makes sure to hold on to something so she doesn't fall. No CP or palpitations. Her breathing has been good. She walks at a slow pace so she doesn't have to stop and catch her breath. On 4L O2. Recently saw pulmonology who is trying to get her on portable oxygen. Patient states they may also decrease her to 2L. Weight at home has been stable at 218-220 lbs. She takes torsemide 40 mg BID and has not needed any extra. No LEE now that foot pain has resolved (patient noted some minor swelling in her R foot before being treated for gout).  She has not started [place] yet. The medication is being shipped to her today. She will start it once the Specialty Pharmacy nurse goes out for the home visit (plans to call and schedule for  the next 48 hours). She also increased her dose of Mounjaro today, now on 7.5 mg weekly.    Has the patient been experiencing any side effects to the medications prescribed?  no  Does the patient have any problems obtaining medications due to transportation or finances?   No; [employer] Medicare  Understanding of regimen: good Understanding of indications: good Potential of compliance: good Patient understands to avoid NSAIDs. Patient understands to avoid decongestants.     Pertinent Lab Values: 12/22/22: Serum creatinine 1.57, BUN 22, Potassium 3.8, Sodium 137  Vital Signs: Weight: 220.2 lbs (last clinic weight: 218.6 lbs) Blood pressure: 120/58  Heart rate: 63   Assessment/Plan: Pulmonary hypertension, Group I + III - Initial RHC waveforms reviewed personally by Dr. Gasper Lloyd. RA 30, RV 107/20-30, PA 107/48 (69), unable to wedge; LVEDP 15. AO 140/64. LVEDP ~16. Assuming a PCWP of 20, TPG would be 49 w/ PVR of 7. RHC 01/05/23, Low pre and post capillary filling pressures, Elevated PVR and PA mean in the setting of normal cardiac output consistent with underlying Group 1 + III PH; mPAP 30 mmHg, PCWP 9 mmHg, PVR4 WU - Hemodynamics consistent with severe pulmonary hypertension largely due to pre-capillary PH.  -Cardiac MRI in 09/2022 with EF 75%, moderate RV dilation with RV EF 50%, moderate TR, mid-wall basal septal/basal inferolateral LGE. Could be seen with cardiac sarcoidosis (vs prior myocarditis). CT chest showed RUL nodule (negative on PET) and mediastinal LAN. V/Q scan not suggestive of chronic PEs. RHC 09/28/22 showed severe PAH with low CI 1.73, PAPi 1.5, RA pressure 32 (R>>L heart failure). TEE this admission with LV EF 60-65%, D-shaped septum, moderate RV dilation with moderate RV dysfunction, mod-severe TR. Likely end-stage PH/cor pulmonale d/t OHS/OSA and group 1 PAH.  - Labs: ANA, HCV negative. HIV pending. LFTs mostly unremarkable.  - PFTs w/ moderate restriction.  - CT chest w/ pulmonary nodules, however, follow up PET mostly unremarkable.  - NYHA class III. Volume status stable.  - Continue torsemide 40 mg BID - Continue sildenafil 40 mg TID  - Continue Opsumit 10 mg daily - Start Uptravi. Patient will have initial visit with Specialty Pharmacy Nurse this week.  - Can consider Winrevair in the future. We discussed this in detail today and I showed her the Winrevair demonstration kit.   2. OSA - followed by pulmonology.  - reports compliance with  bipap   3. CAD - LHC as above - followed by general cardiology.    4. Severe obesity - BMI 35.54 today - Continue Mounjaro. She started increased dose of 7.5 mg weekly today.     Follow up 1 month with Dr. Gasper Lloyd.    Karle Plumber, PharmD, BCPS, [facility], CPP Heart Failure Clinic Pharmacist [phone]

## 2023-01-10 ENCOUNTER — Ambulatory Visit (HOSPITAL_COMMUNITY): Payer: Medicare Other

## 2023-01-11 ENCOUNTER — Other Ambulatory Visit: Payer: Self-pay | Admitting: Cardiology

## 2023-01-12 MED ORDER — MOUNJARO 7.5 MG/0.5ML ~~LOC~~ SOAJ: 7.5000 mg | SUBCUTANEOUS | 0 refills | Status: DC

## 2023-01-12 NOTE — Telephone Encounter (Signed)
Called to titrate dose of Mounjaro from 5 mg 7.5 mg, patient reports her post meal BG 121 ,and FBG 95-110. She off of glipizide and metformin. She feels great, tolerating current dose of Mounjaro well.

## 2023-01-12 NOTE — Addendum Note (Signed)
Addended by: Tylene Fantasia on: 01/12/2023 08:17 AM   Modules accepted: Orders

## 2023-01-17 ENCOUNTER — Encounter (HOSPITAL_COMMUNITY): Payer: Self-pay

## 2023-01-17 ENCOUNTER — Encounter (HOSPITAL_COMMUNITY)
Admission: RE | Admit: 2023-01-17 | Discharge: 2023-01-17 | Disposition: A | Discharge status: Home / Self Care | Payer: Medicare Other | Source: Ambulatory Visit | Attending: Cardiology | Admitting: Cardiology

## 2023-01-17 VITALS — BP 100/50 | HR 62 | Ht 66.0 in | Wt 218.3 lb

## 2023-01-17 DIAGNOSIS — I272 Pulmonary hypertension, unspecified: Secondary | ICD-10-CM | POA: Diagnosis not present

## 2023-01-17 NOTE — Patient Instructions (Signed)
Patient Instructions  Patient Details  Name: Sandra Schneider MRN: [id number] Date of Birth: Feb 15, 1956 Referring Provider:  Dorthula Nettles, DO  Below are your personal goals for exercise, nutrition, and risk factors. Our goal is to help you stay on track towards obtaining and maintaining these goals. We will be discussing your progress on these goals with you throughout the program.  Initial Exercise Prescription:  Initial Exercise Prescription - 01/17/23 0800       Date of Initial Exercise RX and Referring Provider   Date 01/17/23    Referring Provider Dr. Gasper Lloyd      Oxygen   Oxygen Continuous    Liters 4    Maintain Oxygen Saturation 88% or higher      NuStep   Level 2    SPM 80    Minutes 15    METs 1.9      Track   Laps 15    Minutes 15      Prescription Details   Frequency (times per week) 2    Duration Progress to 30 minutes of continuous aerobic without signs/symptoms of physical distress      Intensity   THRR 40-80% of Max Heartrate 98-134    Ratings of Perceived Exertion 11-13    Perceived Dyspnea 0-4      Resistance Training   Training Prescription Yes    Weight 3    Reps 10-15             Exercise Goals: Frequency: Be able to perform aerobic exercise two to three times per week in program working toward 2-5 days per week of home exercise.  Intensity: Work with a perceived exertion of 11 (fairly light) - 15 (hard) while following your exercise prescription.  We will make changes to your prescription with you as you progress through the program.   Duration: Be able to do 30 to 45 minutes of continuous aerobic exercise in addition to a 5 minute warm-up and a 5 minute cool-down routine.   Nutrition Goals: Your personal nutrition goals will be established when you do your nutrition analysis with the dietician.  The following are general nutrition guidelines to follow: Cholesterol < 200mg /day Sodium < 1500mg /day Fiber: Women over 50  yrs - 21 grams per day  Personal Goals:  Personal Goals and Risk Factors at Admission - 01/09/23 1327       Core Components/Risk Factors/Patient Goals on Admission    Weight Management Yes;Obesity;Weight Loss    Intervention Weight Management: Develop a combined nutrition and exercise program designed to reach desired caloric intake, while maintaining appropriate intake of nutrient and fiber, sodium and fats, and appropriate energy expenditure required for the weight goal.;Weight Management: Provide education and appropriate resources to help participant work on and attain dietary goals.;Weight Management/Obesity: Establish reasonable short term and long term weight goals.;Obesity: Provide education and appropriate resources to help participant work on and attain dietary goals.    Expected Outcomes Short Term: Continue to assess and modify interventions until short term weight is achieved;Long Term: Adherence to nutrition and physical activity/exercise program aimed toward attainment of established weight goal;Weight Loss: Understanding of general recommendations for a balanced deficit meal plan, which promotes 1-2 lb weight loss per week and includes a negative energy balance of [phone] kcal/d;Understanding recommendations for meals to include 15-35% energy as protein, 25-35% energy from fat, 35-60% energy from carbohydrates, less than 200mg  of dietary cholesterol, 20-35 gm of total fiber daily;Understanding of distribution of calorie intake throughout the day  with the consumption of 4-5 meals/snacks    Improve shortness of breath with ADL's Yes    Intervention Provide education, individualized exercise plan and daily activity instruction to help decrease symptoms of SOB with activities of daily living.    Expected Outcomes Short Term: Improve cardiorespiratory fitness to achieve a reduction of symptoms when performing ADLs;Long Term: Be able to perform more ADLs without symptoms or delay the onset of  symptoms    Increase knowledge of respiratory medications and ability to use respiratory devices properly  Yes    Intervention Provide education and demonstration as needed of appropriate use of medications, inhalers, and oxygen therapy.    Expected Outcomes Long Term: Maintain appropriate use of medications, inhalers, and oxygen therapy.;Short Term: Achieves understanding of medications use. Understands that oxygen is a medication prescribed by physician. Demonstrates appropriate use of inhaler and oxygen therapy.    Diabetes Yes    Intervention Provide education about signs/symptoms and action to take for hypo/hyperglycemia.;Provide education about proper nutrition, including hydration, and aerobic/resistive exercise prescription along with prescribed medications to achieve blood glucose in normal ranges: Fasting glucose 65-99 mg/dL    Expected Outcomes Short Term: Participant verbalizes understanding of the signs/symptoms and immediate care of hyper/hypoglycemia, proper foot care and importance of medication, aerobic/resistive exercise and nutrition plan for blood glucose control.;Long Term: Attainment of HbA1C < 7%.    Heart Failure Yes    Intervention Provide a combined exercise and nutrition program that is supplemented with education, support and counseling about heart failure. Directed toward relieving symptoms such as shortness of breath, decreased exercise tolerance, and extremity edema.    Expected Outcomes Improve functional capacity of life;Short term: Attendance in program 2-3 days a week with increased exercise capacity. Reported lower sodium intake. Reported increased fruit and vegetable intake. Reports medication compliance.;Short term: Daily weights obtained and reported for increase. Utilizing diuretic protocols set by physician.;Long term: Adoption of self-care skills and reduction of barriers for early signs and symptoms recognition and intervention leading to self-care maintenance.     Hypertension Yes    Intervention Provide education on lifestyle modifcations including regular physical activity/exercise, weight management, moderate sodium restriction and increased consumption of fresh fruit, vegetables, and low fat dairy, alcohol moderation, and smoking cessation.;Monitor prescription use compliance.    Expected Outcomes Short Term: Continued assessment and intervention until BP is < 140/80mm HG in hypertensive participants. < 130/69mm HG in hypertensive participants with diabetes, heart failure or chronic kidney disease.;Long Term: Maintenance of blood pressure at goal levels.    Lipids Yes    Intervention Provide education and support for participant on nutrition & aerobic/resistive exercise along with prescribed medications to achieve LDL 70mg , HDL >40mg .    Expected Outcomes Short Term: Participant states understanding of desired cholesterol values and is compliant with medications prescribed. Participant is following exercise prescription and nutrition guidelines.;Long Term: Cholesterol controlled with medications as prescribed, with individualized exercise RX and with personalized nutrition plan. Value goals: LDL < 70mg , HDL > 40 mg.             Tobacco Use Initial Evaluation: Social History   Tobacco Use  Smoking Status Never   Passive exposure: Never  Smokeless Tobacco Never    Exercise Goals and Review:  Exercise Goals     Row Name 01/17/23 0855             Exercise Goals   Increase Physical Activity Yes       Intervention Provide advice, education, support and  counseling about physical activity/exercise needs.;Develop an individualized exercise prescription for aerobic and resistive training based on initial evaluation findings, risk stratification, comorbidities and participant's personal goals.       Expected Outcomes Short Term: Attend rehab on a regular basis to increase amount of physical activity.;Long Term: Add in home exercise to make exercise  part of routine and to increase amount of physical activity.;Long Term: Exercising regularly at least 3-5 days a week.       Increase Strength and Stamina Yes       Intervention Provide advice, education, support and counseling about physical activity/exercise needs.;Develop an individualized exercise prescription for aerobic and resistive training based on initial evaluation findings, risk stratification, comorbidities and participant's personal goals.       Expected Outcomes Short Term: Increase workloads from initial exercise prescription for resistance, speed, and METs.;Long Term: Improve cardiorespiratory fitness, muscular endurance and strength as measured by increased METs and functional capacity ( );Short Term: Perform resistance training exercises routinely during rehab and add in resistance training at home       Able to understand and use rate of perceived exertion (RPE) scale Yes       Intervention Provide education and explanation on how to use RPE scale       Expected Outcomes Short Term: Able to use RPE daily in rehab to express subjective intensity level;Long Term:  Able to use RPE to guide intensity level when exercising independently       Able to understand and use Dyspnea scale Yes       Intervention Provide education and explanation on how to use Dyspnea scale       Expected Outcomes Short Term: Able to use Dyspnea scale daily in rehab to express subjective sense of shortness of breath during exertion;Long Term: Able to use Dyspnea scale to guide intensity level when exercising independently       Knowledge and understanding of Target Heart Rate Range (THRR) Yes       Intervention Provide education and explanation of THRR including how the numbers were predicted and where they are located for reference       Expected Outcomes Short Term: Able to state/look up THRR;Long Term: Able to use THRR to govern intensity when exercising independently;Short Term: Able to use daily as  guideline for intensity in rehab       Understanding of Exercise Prescription Yes       Intervention Provide education, explanation, and written materials on patient's individual exercise prescription       Expected Outcomes Short Term: Able to explain program exercise prescription;Long Term: Able to explain home exercise prescription to exercise independently                Copy of goals given to participant.

## 2023-01-17 NOTE — Progress Notes (Signed)
Pulmonary Individual Treatment Plan  Patient Details  Name: Sandra Schneider MRN: [id number] Date of Birth: 04-Apr-1956 Referring Provider:   Flowsheet Row PULMONARY REHAB OTHER RESP ORIENTATION from 01/17/2023 in [facility] CARDIAC REHABILITATION  Referring Provider Dr. Gasper Lloyd       Initial Encounter Date:  Flowsheet Row PULMONARY REHAB OTHER RESP ORIENTATION from 01/17/2023 in [facility]IAC RE[facility]  Date 01/17/23       Visit Diagnosis: Pulmonary hypertension (HCC)  Patient's Home Medications on Admission:   Current Outpatient Medications:    acetaminophen (TYLENOL) 325 MG tablet, Take 650 mg by mouth every 6 (six) hours as needed for moderate pain., Disp: , Rfl:    amiodarone (PACERONE) 200 MG tablet, Take 1 tablet (200 mg total) by mouth daily., Disp: 30 tablet, Rfl: 5   amLODipine (NORVASC) 5 MG tablet, Take 1 tablet (5 mg total) by mouth daily., Disp: 90 tablet, Rfl: 3   apixaban (ELIQUIS) 5 MG TABS tablet, Take 1 tablet (5 mg total) by mouth 2 (two) times daily., Disp: 60 tablet, Rfl: 5   Ascorbic Acid (VITAMIN C PO), Take 1 tablet by mouth daily., Disp: , Rfl:    atorvastatin (LIPITOR) 40 MG tablet, Take 1 tablet by mouth once daily, Disp: 90 tablet, Rfl: 2   Blood Pressure Monitor DEVI, 1 each by Does not apply route daily., Disp: 1 each, Rfl: 0   Carboxymethylcellul-Glycerin (REFRESH OPTIVE) 1-0.9 % GEL, Place 1 drop into both eyes as needed (dry eyes)., Disp: , Rfl:    cetirizine (ZYRTEC) 10 MG tablet, Take 10 mg by mouth daily., Disp: , Rfl:    Cholecalciferol (VITAMIN D) 50 MCG (2000 UT) tablet, Take 2,000 Units by mouth daily., Disp: , Rfl:    famotidine (PEPCID) 20 MG tablet, Take 20 mg by mouth daily., Disp: , Rfl:    letrozole (FEMARA) 2.5 MG tablet, Take 2.5 mg by mouth daily., Disp: , Rfl:    macitentan (OPSUMIT) 10 MG tablet, Take 10 mg by mouth daily., Disp: , Rfl:    Multiple Vitamin (MULTIVITAMIN) tablet, Take 1 tablet by mouth daily., Disp: ,  Rfl:    OXYGEN, Inhale 4 L into the lungs continuous., Disp: , Rfl:    Polyethyl Glycol-Propyl Glycol (SYSTANE) 0.4-0.3 % SOLN, Place 1 drop into both eyes as needed (dry eyes)., Disp: , Rfl:    potassium chloride (KLOR-CON M) 10 MEQ tablet, Take 2 tablets (20 mEq total) by mouth daily., Disp: 60 tablet, Rfl: 11   prednisoLONE acetate (PRED FORTE) 1 % ophthalmic suspension, Place 1 drop into both eyes 4 (four) times daily., Disp: , Rfl:    sildenafil (REVATIO) 20 MG tablet, Take 2 tablets (40 mg total) by mouth 3 (three) times daily., Disp: 180 tablet, Rfl: 5   tirzepatide (MOUNJARO) 7.5 MG/0.5ML Pen, Inject 7.5 mg into the skin once a week., Disp: 2 mL, Rfl: 0   torsemide (DEMADEX) 20 MG tablet, Take 2 tablets (40 mg total) by mouth 2 (two) times daily., Disp: 120 tablet, Rfl: 5  Current Facility-Administered Medications:    sodium chloride flush (NS) 0.9 % injection 3 mL, 3 mL, Intravenous, Q12H, Branch, Dorothe Pea, MD  Past Medical History: Past Medical History:  Diagnosis Date   Breast cancer (HCC)    Coronary artery disease    Diabetes mellitus    GERD (gastroesophageal reflux disease)    Hypertension    Myocardial abscess    Myocardial infarct (HCC) 06/24/2012   Pulmonary hypertension (HCC)  Tobacco Use: Social History   Tobacco Use  Smoking Status Never   Passive exposure: Never  Smokeless Tobacco Never    Labs: Review Flowsheet  More data exists      Latest Ref Rng & Units 10/05/2022 10/06/2022 10/07/2022 10/31/2022 01/05/2023  Labs for ITP Cardiac and Pulmonary Rehab  Bicarbonate 20.0 - 28.0 mmol/L - - - 28.4  29.2  28.6  27.8   TCO2 22 - 32 mmol/L - - - 30  31  30  29    O2 Saturation % 72.4  65.2  73  59  57  73  72     Details       Multiple values from one day are sorted in reverse-chronological order         Capillary Blood Glucose: Lab Results  Component Value Date   GLUCAP 95 01/05/2023   GLUCAP 94 10/31/2022   GLUCAP 183 (H) 10/07/2022   GLUCAP  111 (H) 10/07/2022   GLUCAP 189 (H) 10/06/2022     Pulmonary Assessment Scores:  Pulmonary Assessment Scores     Row Name 01/17/23 0926         ADL UCSD   ADL Phase Entry     SOB Score total 13     Rest 0     Walk 1     Stairs 3     Bath 0     Dress 0     Shop 1       CAT Score   CAT Score 7       mMRC Score   mMRC Score 1             UCSD: Self-administered rating of dyspnea associated with activities of daily living (ADLs) 6-point scale (0 = "not at all" to 5 = "maximal or unable to do because of breathlessness")  Scoring Scores range from 0 to 120.  Minimally important difference is 5 units  CAT: CAT can identify the health impairment of COPD patients and is better correlated with disease progression.  CAT has a scoring range of zero to 40. The CAT score is classified into four groups of low (less than 10), medium (10 - 20), high (21-30) and very high (31-40) based on the impact level of disease on health status. A CAT score over 10 suggests significant symptoms.  A worsening CAT score could be explained by an exacerbation, poor medication adherence, poor inhaler technique, or progression of COPD or comorbid conditions.  CAT MCID is 2 points  mMRC: mMRC (Modified Medical Research Council) Dyspnea Scale is used to assess the degree of baseline functional disability in patients of respiratory disease due to dyspnea. No minimal important difference is established. A decrease in score of 1 point or greater is considered a positive change.   Pulmonary Function Assessment:   Exercise Target Goals: Exercise Program Goal: Individual exercise prescription set using results from initial 6 min walk test and THRR while considering  patient's activity barriers and safety.   Exercise Prescription Goal: Initial exercise prescription builds to 30-45 minutes a day of aerobic activity, 2-3 days per week.  Home exercise guidelines will be given to patient during program as part  of exercise prescription that the participant will acknowledge.  Activity Barriers & Risk Stratification:  Activity Barriers & Cardiac Risk Stratification - 01/09/23 1318       Activity Barriers & Cardiac Risk Stratification   Activity Barriers Other (comment);Deconditioning;Muscular Weakness;Shortness of Breath;Balance Concerns;Assistive Device    Comments  gout flare up             6 Minute Walk:  6 Minute Walk     Row Name 01/17/23 0852         6 Minute Walk   Phase Initial     Distance 850 feet     Walk Time 6 minutes     # of Rest Breaks 0     MPH 1.6     METS 1.61     RPE 12     Perceived Dyspnea  0     VO2 Peak 5.64     Symptoms Yes (comment)     Comments pt stated that RLE was developing gout so it was harder to walk     Resting HR 62 bpm     Resting BP 100/50     Resting Oxygen Saturation  100 %     Exercise Oxygen Saturation  during 6 min walk 100 %     Max Ex. HR 78 bpm     Max Ex. BP 140/52     2 Minute Post BP 130/52       Interval HR   1 Minute HR 64     2 Minute HR 75     3 Minute HR 77     4 Minute HR 78     5 Minute HR 76     6 Minute HR 78     2 Minute Post HR 66     Interval Heart Rate? Yes       Interval Oxygen   Interval Oxygen? Yes     Baseline Oxygen Saturation % 100 %     1 Minute Oxygen Saturation % 100 %     1 Minute Liters of Oxygen 4 L     2 Minute Oxygen Saturation % 100 %     2 Minute Liters of Oxygen 4 L     3 Minute Oxygen Saturation % 100 %     3 Minute Liters of Oxygen 4 L     4 Minute Oxygen Saturation % 100 %     4 Minute Liters of Oxygen 4 L     5 Minute Oxygen Saturation % 100 %     5 Minute Liters of Oxygen 4 L     6 Minute Oxygen Saturation % 100 %     6 Minute Liters of Oxygen 4 L     2 Minute Post Oxygen Saturation % 100 %     2 Minute Post Liters of Oxygen 4 L              Oxygen Initial Assessment:  Oxygen Initial Assessment - 01/17/23 0857       Home Oxygen   Home Oxygen Device E-Tanks;Home  Concentrator      Initial 6 min Walk   Oxygen Used Continuous    Liters per minute 4             Oxygen Re-Evaluation:   Oxygen Discharge (Final Oxygen Re-Evaluation):   Initial Exercise Prescription:  Initial Exercise Prescription - 01/17/23 0800       Date of Initial Exercise RX and Referring Provider   Date 01/17/23    Referring Provider Dr. Gasper Lloyd      Oxygen   Oxygen Continuous    Liters 4    Maintain Oxygen Saturation 88% or higher      NuStep   Level 2    SPM 80  Minutes 15    METs 1.9      Track   Laps 15    Minutes 15      Prescription Details   Frequency (times per week) 2    Duration Progress to 30 minutes of continuous aerobic without signs/symptoms of physical distress      Intensity   THRR 40-80% of Max Heartrate 98-134    Ratings of Perceived Exertion 11-13    Perceived Dyspnea 0-4      Resistance Training   Training Prescription Yes    Weight 3    Reps 10-15             Perform Capillary Blood Glucose checks as needed.  Exercise Prescription Changes:   Exercise Comments:   Exercise Goals and Review:   Exercise Goals     Row Name 01/17/23 0855             Exercise Goals   Increase Physical Activity Yes       Intervention Provide advice, education, support and counseling about physical activity/exercise needs.;Develop an individualized exercise prescription for aerobic and resistive training based on initial evaluation findings, risk stratification, comorbidities and participant's personal goals.       Expected Outcomes Short Term: Attend rehab on a regular basis to increase amount of physical activity.;Long Term: Add in home exercise to make exercise part of routine and to increase amount of physical activity.;Long Term: Exercising regularly at least 3-5 days a week.       Increase Strength and Stamina Yes       Intervention Provide advice, education, support and counseling about physical activity/exercise  needs.;Develop an individualized exercise prescription for aerobic and resistive training based on initial evaluation findings, risk stratification, comorbidities and participant's personal goals.       Expected Outcomes Short Term: Increase workloads from initial exercise prescription for resistance, speed, and METs.;Long Term: Improve cardiorespiratory fitness, muscular endurance and strength as measured by increased METs and functional capacity ( );Short Term: Perform resistance training exercises routinely during rehab and add in resistance training at home       Able to understand and use rate of perceived exertion (RPE) scale Yes       Intervention Provide education and explanation on how to use RPE scale       Expected Outcomes Short Term: Able to use RPE daily in rehab to express subjective intensity level;Long Term:  Able to use RPE to guide intensity level when exercising independently       Able to understand and use Dyspnea scale Yes       Intervention Provide education and explanation on how to use Dyspnea scale       Expected Outcomes Short Term: Able to use Dyspnea scale daily in rehab to express subjective sense of shortness of breath during exertion;Long Term: Able to use Dyspnea scale to guide intensity level when exercising independently       Knowledge and understanding of Target Heart Rate Range (THRR) Yes       Intervention Provide education and explanation of THRR including how the numbers were predicted and where they are located for reference       Expected Outcomes Short Term: Able to state/look up THRR;Long Term: Able to use THRR to govern intensity when exercising independently;Short Term: Able to use daily as guideline for intensity in rehab       Understanding of Exercise Prescription Yes       Intervention Provide education, explanation, and  written materials on patient's individual exercise prescription       Expected Outcomes Short Term: Able to explain program  exercise prescription;Long Term: Able to explain home exercise prescription to exercise independently                Exercise Goals Re-Evaluation :   Discharge Exercise Prescription (Final Exercise Prescription Changes):   Nutrition:  Target Goals: Understanding of nutrition guidelines, daily intake of sodium 1500mg , cholesterol 200mg , calories 30% from fat and 7% or less from saturated fats, daily to have 5 or more servings of fruits and vegetables.  Biometrics:  Pre Biometrics - 01/17/23 0856       Pre Biometrics   Height 5\' 6"  (1.676 m)    Weight 99 kg    Waist Circumference 46 inches    Hip Circumference 48 inches    Waist to Hip Ratio 0.96 %    BMI (Calculated) 35.24    Grip Strength 22.5 kg    Flexibility 0 in    Single Leg Stand 0 seconds              Nutrition Therapy Plan and Nutrition Goals:  Nutrition Therapy & Goals - 01/09/23 1327       Intervention Plan   Intervention Prescribe, educate and counsel regarding individualized specific dietary modifications aiming towards targeted core components such as weight, hypertension, lipid management, diabetes, heart failure and other comorbidities.    Expected Outcomes Short Term Goal: Understand basic principles of dietary content, such as calories, fat, sodium, cholesterol and nutrients.             Nutrition Assessments:  MEDIFICTS Score Key: ?70 Need to make dietary changes  40-70 Heart Healthy Diet ? 40 Therapeutic Level Cholesterol Diet  Flowsheet Row PULMONARY REHAB OTHER RESP ORIENTATION from 01/17/2023 in [facility] CARDIAC REHABILITATION  Picture Your Plate Total Score on Admission 51      Picture Your Plate Scores: <08 Unhealthy dietary pattern with much room for improvement. 41-50 Dietary pattern unlikely to meet recommendations for good health and room for improvement. 51-60 More healthful dietary pattern, with some room for improvement.  >60 Healthy dietary pattern, although there  may be some specific behaviors that could be improved.    Nutrition Goals Re-Evaluation:   Nutrition Goals Discharge (Final Nutrition Goals Re-Evaluation):   Psychosocial: Target Goals: Acknowledge presence or absence of significant depression and/or stress, maximize coping skills, provide positive support system. Participant is able to verbalize types and ability to use techniques and skills needed for reducing stress and depression.  Initial Review & Psychosocial Screening:  Initial Psych Review & Screening - 01/09/23 1322       Initial Review   Current issues with Current Stress Concerns    Source of Stress Concerns Chronic Illness    Comments on oxygen      Family Dynamics   Good Support System? Yes   sister, oldest son, church family     Barriers   Psychosocial barriers to participate in program There are no identifiable barriers or psychosocial needs.      Screening Interventions   Interventions Encouraged to exercise;To provide support and resources with identified psychosocial needs;Provide feedback about the scores to participant    Expected Outcomes Short Term goal: Utilizing psychosocial counselor, staff and physician to assist with identification of specific Stressors or current issues interfering with healing process. Setting desired goal for each stressor or current issue identified.;Short Term goal: Identification and review with participant of any  Quality of Life or Depression concerns found by scoring the questionnaire.;Long Term Goal: Stressors or current issues are controlled or eliminated.;Long Term goal: The participant improves quality of Life and PHQ9 Scores as seen by post scores and/or verbalization of changes             Quality of Life Scores:  Scores of 19 and below usually indicate a poorer quality of life in these areas.  A difference of  2-3 points is a clinically meaningful difference.  A difference of 2-3 points in the total score of the Quality  of Life Index has been associated with significant improvement in overall quality of life, self-image, physical symptoms, and general health in studies assessing change in quality of life.   PHQ-9: Review Flowsheet       01/17/2023 08/13/2012  Depression screen PHQ 2/9  Decreased Interest 3 0  Down, Depressed, Hopeless 0 0  PHQ - 2 Score 3 0  Altered sleeping 0 -  Tired, decreased energy 0 -  Change in appetite 1 -  Feeling bad or failure about yourself  0 -  Trouble concentrating 0 -  Suicidal thoughts 0 -  PHQ-9 Score 4 -  Difficult doing work/chores Not difficult at all -    Details           Interpretation of Total Score  Total Score Depression Severity:  1-4 = Minimal depression, 5-9 = Mild depression, 10-14 = Moderate depression, 15-19 = Moderately severe depression, 20-27 = Severe depression   Psychosocial Evaluation and Intervention:  Psychosocial Evaluation - 01/09/23 1324       Psychosocial Evaluation & Interventions   Interventions Encouraged to exercise with the program and follow exercise prescription    Comments Naryah is coming into Pulmonary Rehab for Pulmonary Hypertension.  She is eager to feel better. She is motivated and wants to get her heart and lungs healthy and keep the fluid off.  She has a good support system and lives with her sister and her oldest son.  Her biggest barrier is that she lives in [place] and will have to drive from there.  She feels confident that she can do it.  She enjoys going to church and watching TV and movies.  She denies any major stressors other than her personal health and dealing with her oxygen needs.  She has an appt with oxygen company coming up to try to get set up with a portable concentrator.    Expected Outcomes short: Attend rehab to improve heart function Long: Exercise for stamina and mental boost.    Continue Psychosocial Services  Follow up required by staff             Psychosocial  Re-Evaluation:   Psychosocial Discharge (Final Psychosocial Re-Evaluation):    Education: Education Goals: Education classes will be provided on a weekly basis, covering required topics. Participant will state understanding/return demonstration of topics presented.  Learning Barriers/Preferences:  Learning Barriers/Preferences - 01/09/23 1319       Learning Barriers/Preferences   Learning Barriers Sight   glasses for reading   Learning Preferences Written Material;Skilled Demonstration             Education Topics: How Lungs Work and Diseases: - Discuss the anatomy of the lungs and diseases that can affect the lungs, such as COPD.   Exercise: -Discuss the importance of exercise, FITT principles of exercise, normal and abnormal responses to exercise, and how to exercise safely.   Environmental Irritants: -Discuss types of  environmental irritants and how to limit exposure to environmental irritants.   Meds/Inhalers and oxygen: - Discuss respiratory medications, definition of an inhaler and oxygen, and the proper way to use an inhaler and oxygen.   Energy Saving Techniques: - Discuss methods to conserve energy and decrease shortness of breath when performing activities of daily living.    Bronchial Hygiene / Breathing Techniques: - Discuss breathing mechanics, pursed-lip breathing technique,  proper posture, effective ways to clear airways, and other functional breathing techniques   Cleaning Equipment: - Provides group verbal and written instruction about the health risks of elevated stress, cause of high stress, and healthy ways to reduce stress.   Nutrition I: Fats: - Discuss the types of cholesterol, what cholesterol does to the body, and how cholesterol levels can be controlled.   Nutrition II: Labels: -Discuss the different components of food labels and how to read food labels.   Respiratory Infections: - Discuss the signs and symptoms of respiratory  infections, ways to prevent respiratory infections, and the importance of seeking medical treatment when having a respiratory infection.   Stress I: Signs and Symptoms: - Discuss the causes of stress, how stress may lead to anxiety and depression, and ways to limit stress.   Stress II: Relaxation: -Discuss relaxation techniques to limit stress.   Oxygen for Home/Travel: - Discuss how to prepare for travel when on oxygen and proper ways to transport and store oxygen to ensure safety.   Knowledge Questionnaire Score:  Knowledge Questionnaire Score - 01/17/23 0925       Knowledge Questionnaire Score   Pre Score 17/18             Core Components/Risk Factors/Patient Goals at Admission:  Personal Goals and Risk Factors at Admission - 01/09/23 1327       Core Components/Risk Factors/Patient Goals on Admission    Weight Management Yes;Obesity;Weight Loss    Intervention Weight Management: Develop a combined nutrition and exercise program designed to reach desired caloric intake, while maintaining appropriate intake of nutrient and fiber, sodium and fats, and appropriate energy expenditure required for the weight goal.;Weight Management: Provide education and appropriate resources to help participant work on and attain dietary goals.;Weight Management/Obesity: Establish reasonable short term and long term weight goals.;Obesity: Provide education and appropriate resources to help participant work on and attain dietary goals.    Expected Outcomes Short Term: Continue to assess and modify interventions until short term weight is achieved;Long Term: Adherence to nutrition and physical activity/exercise program aimed toward attainment of established weight goal;Weight Loss: Understanding of general recommendations for a balanced deficit meal plan, which promotes 1-2 lb weight loss per week and includes a negative energy balance of [phone] kcal/d;Understanding recommendations for meals to  include 15-35% energy as protein, 25-35% energy from fat, 35-60% energy from carbohydrates, less than 200mg  of dietary cholesterol, 20-35 gm of total fiber daily;Understanding of distribution of calorie intake throughout the day with the consumption of 4-5 meals/snacks    Improve shortness of breath with ADL's Yes    Intervention Provide education, individualized exercise plan and daily activity instruction to help decrease symptoms of SOB with activities of daily living.    Expected Outcomes Short Term: Improve cardiorespiratory fitness to achieve a reduction of symptoms when performing ADLs;Long Term: Be able to perform more ADLs without symptoms or delay the onset of symptoms    Increase knowledge of respiratory medications and ability to use respiratory devices properly  Yes    Intervention Provide education and demonstration  as needed of appropriate use of medications, inhalers, and oxygen therapy.    Expected Outcomes Long Term: Maintain appropriate use of medications, inhalers, and oxygen therapy.;Short Term: Achieves understanding of medications use. Understands that oxygen is a medication prescribed by physician. Demonstrates appropriate use of inhaler and oxygen therapy.    Diabetes Yes    Intervention Provide education about signs/symptoms and action to take for hypo/hyperglycemia.;Provide education about proper nutrition, including hydration, and aerobic/resistive exercise prescription along with prescribed medications to achieve blood glucose in normal ranges: Fasting glucose 65-99 mg/dL    Expected Outcomes Short Term: Participant verbalizes understanding of the signs/symptoms and immediate care of hyper/hypoglycemia, proper foot care and importance of medication, aerobic/resistive exercise and nutrition plan for blood glucose control.;Long Term: Attainment of HbA1C < 7%.    Heart Failure Yes    Intervention Provide a combined exercise and nutrition program that is supplemented with  education, support and counseling about heart failure. Directed toward relieving symptoms such as shortness of breath, decreased exercise tolerance, and extremity edema.    Expected Outcomes Improve functional capacity of life;Short term: Attendance in program 2-3 days a week with increased exercise capacity. Reported lower sodium intake. Reported increased fruit and vegetable intake. Reports medication compliance.;Short term: Daily weights obtained and reported for increase. Utilizing diuretic protocols set by physician.;Long term: Adoption of self-care skills and reduction of barriers for early signs and symptoms recognition and intervention leading to self-care maintenance.    Hypertension Yes    Intervention Provide education on lifestyle modifcations including regular physical activity/exercise, weight management, moderate sodium restriction and increased consumption of fresh fruit, vegetables, and low fat dairy, alcohol moderation, and smoking cessation.;Monitor prescription use compliance.    Expected Outcomes Short Term: Continued assessment and intervention until BP is < 140/37mm HG in hypertensive participants. < 130/2mm HG in hypertensive participants with diabetes, heart failure or chronic kidney disease.;Long Term: Maintenance of blood pressure at goal levels.    Lipids Yes    Intervention Provide education and support for participant on nutrition & aerobic/resistive exercise along with prescribed medications to achieve LDL 70mg , HDL >40mg .    Expected Outcomes Short Term: Participant states understanding of desired cholesterol values and is compliant with medications prescribed. Participant is following exercise prescription and nutrition guidelines.;Long Term: Cholesterol controlled with medications as prescribed, with individualized exercise RX and with personalized nutrition plan. Value goals: LDL < 70mg , HDL > 40 mg.             Core Components/Risk Factors/Patient Goals Review:     Core Components/Risk Factors/Patient Goals at Discharge (Final Review):    ITP Comments:  ITP Comments     Row Name 01/09/23 1330           ITP Comments Completed virtual orientation today.  EP evaluation is scheduled for 01/17/23 at 8am.  Documentation for diagnosis can be found in [facility] encounter 12/22/22.                Comments: Patient arrived for 1st visit/orientation/education at 0800. Patient was referred to PR by Dr. Gasper Lloyd due to Pulmonary HTN. During orientation advised patient on arrival and appointment times what to wear, what to do before, during and after exercise. Reviewed attendance and class policy.  Pt is scheduled to return Pulmonary Rehab on 01/23/23 at 1030. Pt was advised to come to class 15 minutes before class starts.  Discussed RPE/Dpysnea scales. Patient participated in warm up stretches. Patient was able to complete 6 minute walk test. Patient  was measured for the equipment. Discussed equipment safety with patient. Took patient pre-anthropometric measurements. Patient finished visit at [phone].

## 2023-01-19 ENCOUNTER — Ambulatory Visit: Payer: Medicare Other | Admitting: Pulmonary Disease

## 2023-01-19 ENCOUNTER — Encounter: Payer: Self-pay | Admitting: Pulmonary Disease

## 2023-01-19 VITALS — BP 134/52 | HR 67 | Ht 66.0 in | Wt 218.6 lb

## 2023-01-19 DIAGNOSIS — J9611 Chronic respiratory failure with hypoxia: Secondary | ICD-10-CM | POA: Diagnosis not present

## 2023-01-19 DIAGNOSIS — G4733 Obstructive sleep apnea (adult) (pediatric): Secondary | ICD-10-CM | POA: Diagnosis not present

## 2023-01-19 DIAGNOSIS — I272 Pulmonary hypertension, unspecified: Secondary | ICD-10-CM | POA: Diagnosis not present

## 2023-01-19 NOTE — Telephone Encounter (Signed)
Advanced Heart Failure Patient Advocate Encounter  Per [facility], patient hung up when they last tried to call. She would need to call [phone] to schedule shipment.  Called and spoke with the patient, clarified a few questions. She will now return the call to [facility].  Archer Asa, CPhT

## 2023-01-19 NOTE — Progress Notes (Signed)
Subjective:    Patient ID: Sandra Schneider, female    DOB: 1955-11-19, 67 y.o.   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    PMH -hypertension Diabetes type 2 CAD status post DES to LAD and left circumflex Breast cancer 2021 s/p lumpectomy and radiation, now on Femara  She presented with dyspnea on exertion ongoing for several months and was diagnosed with severe pulm hypertension on echo, right heart cath confirmed and suggested elevated LVEDP. PFTs showed moderate restriction, intraparenchymal. CT chest did not show any evidence of ILD but showed mild mediastinal lymphadenopathy and a right upper lobe nodule. Hence PET scan was performed which did not show any hypermetabolic them in these areas, suggested rectal mass and hypermetabolic breast mass. Mammogram was negative and so was colonoscopy.   44-month follow-up visit. Interim-she underwent multiple right heart caths, medications have been titrated up to sildenafil with addition of Opsumit and now Cordie Grice is planned.  Cardiac MRI suggested restrictive disease. She was admitted 09/2022 and diuresed 35 pounds, discharged on 4 L oxygen.  Weight has decreased significantly from 2 88-2 1 8  pounds We initiated CPAP therapy and a titration study showed requirement of 15 cm with 2 L of oxygen With severe PLM's  She request portable oxygen. On ambulation however oxygen saturation dropped from 100 to at 94%    Significant tests/ events reviewed  DME [employer] 09/2022 CPAP titration >> 15 cm with 2 L oxygen management, severe PLM's 06/2022 HST showed severe  OSA with AHI 40/ hr and lowest: 50%!   12/2022 RHC  RA:                  1 mmHg (mean) RV:                  78/4 mmHg PA:                  71/15 mmHg (38 mean) PCWP:            9 mmHg (mean)                                       Estimated Fick CO/CI   7.3 L/min, 3.5 L/min/m2                                              TPG                 29  mmHg                                            PVR                 4 Wood Units  PAPi                >10       IMPRESSION: Low pre and post capillary filling pressures.  Elevated PVR and PA  mean in the setting of normal cardiac output consistent with underlying Group 1 + III PH   VBG 7.2 9/52/41   PFTs 03/2022 moderate restriction, ratio 84, FEV1 65%, FVC 59%, TLC 74%, DLCO 13.6/64%.   02/2022 VQ scan negative. CT chest without con 02/2022 right upper lobe nodule 9mm, mild right paratracheal right subcarinal lymphadenopathy, no evidence of ILD   PET scan 03/2022 no hypermetabolism in lung nodule or lymphadenopathy, possible rectal mass, breast mass mildly hypermetabolic SUV 2.8    Review of Systems neg for any significant sore throat, dysphagia, itching, sneezing, nasal congestion or excess/ purulent secretions, fever, chills, sweats, unintended wt loss, pleuritic or exertional cp, hempoptysis, orthopnea pnd or change in chronic leg swelling. Also denies presyncope, palpitations, heartburn, abdominal pain, nausea, vomiting, diarrhea or change in bowel or urinary habits, dysuria,hematuria, rash, arthralgias, visual complaints, headache, numbness weakness or ataxia.     Objective:   Physical Exam  Gen. Pleasant, obese, in no distress ENT - no lesions, no post nasal drip Neck: No JVD, no thyromegaly, no carotid bruits Lungs: no use of accessory muscles, no dullness to percussion, decreased without rales or rhonchi  Cardiovascular: Rhythm regular, heart sounds  normal, no murmurs or gallops, no peripheral edema Musculoskeletal: No deformities, no cyanosis or clubbing , no tremors       Assessment & Plan:

## 2023-01-19 NOTE — Assessment & Plan Note (Signed)
CPAP download was reviewed which shows excellent control of events on 15 cm with mild leak.  She is compliant averaging about 5 hours per night.  CPAP is only helping for daytime somnolence and fatigue. Will change her to auto settings 10 to 15 cm It is possible that there is significant weight loss she needs lower pressures.  Weight loss encouraged, compliance with goal of at least 4-6 hrs every night is the expectation. Advised against medications with sedative side effects Cautioned against driving when sleepy - understanding that sleepiness will vary on a day to day basis

## 2023-01-19 NOTE — Assessment & Plan Note (Signed)
Combination of WHO 1/2/3 Sandra Schneider being added to her regimen of sildenafil and Opsumit She appears euvolemic

## 2023-01-19 NOTE — Assessment & Plan Note (Signed)
She will continue to have 4 L oxygen blended into her CPAP machine. She will need reassessment for POC once she reaches a stable state with a pulmonary hypertension, currently she does not qualify

## 2023-01-19 NOTE — Patient Instructions (Signed)
X Change to auto 10-15 cm - [employer]  X AMb sat at [facility] or [address] office with POC to see if she qualifies

## 2023-01-23 ENCOUNTER — Encounter (HOSPITAL_COMMUNITY)
Admission: RE | Admit: 2023-01-23 | Discharge: 2023-01-23 | Disposition: A | Discharge status: Home / Self Care | Payer: Medicare Other | Source: Ambulatory Visit | Attending: Cardiology | Admitting: Cardiology

## 2023-01-23 DIAGNOSIS — I272 Pulmonary hypertension, unspecified: Secondary | ICD-10-CM

## 2023-01-23 LAB — GLUCOSE, CAPILLARY
Glucose-Capillary: 99 mg/dL (ref 70–99)
Glucose-Capillary: 106 mg/dL — ABNORMAL HIGH (ref 70–99)
Glucose-Capillary: 104 mg/dL — ABNORMAL HIGH (ref 70–99)

## 2023-01-23 NOTE — Progress Notes (Signed)
Daily Session Note  Patient Details  Name: Sandra Schneider MRN: [id number] Date of Birth: January 01, 1956 Referring Provider:   Flowsheet Row PULMONARY REHAB OTHER RESP ORIENTATION from 01/17/2023 in [facility] [place] CARDIAC REHABILITATION  Referring Provider Dr. Gasper Lloyd       Encounter Date: 01/23/2023  Check In:  Session Check In - 01/23/23 1030       Check-In   Supervising physician immediately available to respond to emergencies See telemetry face sheet for immediately available MD    Location [facility]    Staff Present Ross Ludwig, BS, Exercise Physiologist;Lilas Diefendorf Daphine Deutscher, RN, BSN;Jessica Hawkins, MA, RCEP, CCRP, CCET    Virtual Visit No    Medication changes reported     No    Fall or balance concerns reported    No    Tobacco Cessation No Change    Warm-up and Cool-down Performed on first and last piece of equipment    Resistance Training Performed Yes    VAD Patient? No      Pain Assessment   Currently in Pain? No/denies    Pain Score 0-No pain             Capillary Blood Glucose: Results for orders placed or performed during the hospital encounter of 01/23/23 (from the past 24 hour(s))  Glucose, capillary     Status: None   Collection Time: 01/23/23 10:29 AM  Result Value Ref Range   Glucose-Capillary 99 70 - 99 mg/dL  Glucose, capillary     Status: Abnormal   Collection Time: 01/23/23 10:42 AM  Result Value Ref Range   Glucose-Capillary 106 (H) 70 - 99 mg/dL      Social History   Tobacco Use  Smoking Status Never   Passive exposure: Never  Smokeless Tobacco Never    Goals Met:  Proper associated with RPD/PD & O2 Sat Independence with exercise equipment Using PLB without cueing & demonstrates good technique Exercise tolerated well Queuing for purse lip breathing No report of concerns or symptoms today Strength training completed today  Goals Unmet:  Not Applicable  Comments: First full day of exercise!  Patient was  oriented to gym and equipment including functions, settings, policies, and procedures.  Patient's individual exercise prescription and treatment plan were reviewed.  All starting workloads were established based on the results of the 6 minute walk test done at initial orientation visit.  The plan for exercise progression was also introduced and progression will be customized based on patient's performance and goals.    Dr. Erick Blinks is Medical Director for [facility] Pulmonary Rehab.

## 2023-01-24 ENCOUNTER — Ambulatory Visit (HOSPITAL_COMMUNITY)
Admission: RE | Admit: 2023-01-24 | Discharge: 2023-01-24 | Disposition: A | Discharge status: Home / Self Care | Payer: Medicare Other | Source: Ambulatory Visit | Attending: Internal Medicine | Admitting: Internal Medicine

## 2023-01-24 ENCOUNTER — Ambulatory Visit (HOSPITAL_BASED_OUTPATIENT_CLINIC_OR_DEPARTMENT_OTHER): Payer: Medicare Other | Admitting: Pulmonary Disease

## 2023-01-24 ENCOUNTER — Encounter (HOSPITAL_COMMUNITY): Payer: Self-pay | Admitting: *Deleted

## 2023-01-24 VITALS — BP 120/58 | HR 63 | Wt 220.2 lb

## 2023-01-24 DIAGNOSIS — I10 Essential (primary) hypertension: Secondary | ICD-10-CM | POA: Diagnosis present

## 2023-01-24 DIAGNOSIS — I2510 Atherosclerotic heart disease of native coronary artery without angina pectoris: Secondary | ICD-10-CM | POA: Diagnosis not present

## 2023-01-24 DIAGNOSIS — G4733 Obstructive sleep apnea (adult) (pediatric): Secondary | ICD-10-CM | POA: Insufficient documentation

## 2023-01-24 DIAGNOSIS — Z6835 Body mass index (BMI) 35.0-35.9, adult: Secondary | ICD-10-CM | POA: Insufficient documentation

## 2023-01-24 DIAGNOSIS — I272 Pulmonary hypertension, unspecified: Secondary | ICD-10-CM

## 2023-01-24 DIAGNOSIS — I252 Old myocardial infarction: Secondary | ICD-10-CM | POA: Diagnosis not present

## 2023-01-24 DIAGNOSIS — I2723 Pulmonary hypertension due to lung diseases and hypoxia: Secondary | ICD-10-CM | POA: Diagnosis not present

## 2023-01-24 DIAGNOSIS — I270 Primary pulmonary hypertension: Secondary | ICD-10-CM | POA: Diagnosis not present

## 2023-01-24 MED ORDER — UPTRAVI TITRATION 200 & 800 MCG PO TBPK: ORAL_TABLET | ORAL | Status: DC

## 2023-01-24 NOTE — Progress Notes (Signed)
Pulmonary Individual Treatment Plan  Patient Details  Name: Sandra Schneider MRN: [id number] Date of Birth: 1956/02/04 Referring Provider:   Flowsheet Row PULMONARY REHAB OTHER RESP ORIENTATION from 01/17/2023 in [facility] CARDIAC REHABILITATION  Referring Provider Dr. Gasper Lloyd       Initial Encounter Date:  Flowsheet Row PULMONARY REHAB OTHER RESP ORIENTATION from 01/17/2023 in [facility] CARDIAC REHABILITATION  Date 01/17/23       Visit Diagnosis: Pulmonary hypertension (HCC)  Patient's Home Medications on Admission:   Current Outpatient Medications:    acetaminophen (TYLENOL) 325 MG tablet, Take 650 mg by mouth every 6 (six) hours as needed for moderate pain., Disp: , Rfl:    amiodarone (PACERONE) 200 MG tablet, Take 1 tablet (200 mg total) by mouth daily., Disp: 30 tablet, Rfl: 5   amLODipine (NORVASC) 5 MG tablet, Take 1 tablet (5 mg total) by mouth daily., Disp: 90 tablet, Rfl: 3   apixaban (ELIQUIS) 5 MG TABS tablet, Take 1 tablet (5 mg total) by mouth 2 (two) times daily., Disp: 60 tablet, Rfl: 5   Ascorbic Acid (VITAMIN C PO), Take 1 tablet by mouth daily., Disp: , Rfl:    atorvastatin (LIPITOR) 40 MG tablet, Take 1 tablet by mouth once daily, Disp: 90 tablet, Rfl: 2   Blood Pressure Monitor DEVI, 1 each by Does not apply route daily., Disp: 1 each, Rfl: 0   Carboxymethylcellul-Glycerin (REFRESH OPTIVE) 1-0.9 % GEL, Place 1 drop into both eyes as needed (dry eyes)., Disp: , Rfl:    cetirizine (ZYRTEC) 10 MG tablet, Take 10 mg by mouth daily., Disp: , Rfl:    Cholecalciferol (VITAMIN D) 50 MCG (2000 UT) tablet, Take 2,000 Units by mouth daily., Disp: , Rfl:    famotidine (PEPCID) 20 MG tablet, Take 20 mg by mouth daily., Disp: , Rfl:    letrozole (FEMARA) 2.5 MG tablet, Take 2.5 mg by mouth daily., Disp: , Rfl:    macitentan (OPSUMIT) 10 MG tablet, Take 10 mg by mouth daily., Disp: , Rfl:    Multiple Vitamin (MULTIVITAMIN) tablet, Take 1 tablet by mouth daily., Disp: ,  Rfl:    OXYGEN, Inhale 4 L into the lungs continuous., Disp: , Rfl:    Polyethyl Glycol-Propyl Glycol (SYSTANE) 0.4-0.3 % SOLN, Place 1 drop into both eyes as needed (dry eyes)., Disp: , Rfl:    potassium chloride (KLOR-CON M) 10 MEQ tablet, Take 2 tablets (20 mEq total) by mouth daily., Disp: 60 tablet, Rfl: 11   prednisoLONE acetate (PRED FORTE) 1 % ophthalmic suspension, Place 1 drop into both eyes 4 (four) times daily., Disp: , Rfl:    Selexipag (UPTRAVI TITRATION) 200 & 800 MCG TBPK, Take 200 mcg twice daily. Increase as directed up to 1600 mcg twice daily, Disp: , Rfl:    sildenafil (REVATIO) 20 MG tablet, Take 2 tablets (40 mg total) by mouth 3 (three) times daily., Disp: 180 tablet, Rfl: 5   tirzepatide (MOUNJARO) 7.5 MG/0.5ML Pen, Inject 7.5 mg into the skin once a week., Disp: 2 mL, Rfl: 0   torsemide (DEMADEX) 20 MG tablet, Take 2 tablets (40 mg total) by mouth 2 (two) times daily., Disp: 120 tablet, Rfl: 5  Past Medical History: Past Medical History:  Diagnosis Date   Breast cancer (HCC)    Coronary artery disease    Diabetes mellitus    GERD (gastroesophageal reflux disease)    Hypertension    Myocardial abscess    Myocardial infarct (HCC) 06/24/2012   Pulmonary hypertension (HCC)  Tobacco Use: Social History   Tobacco Use  Smoking Status Never   Passive exposure: Never  Smokeless Tobacco Never    Labs: Review Flowsheet  More data exists      Latest Ref Rng & Units 10/05/2022 10/06/2022 10/07/2022 10/31/2022 01/05/2023  Labs for ITP Cardiac and Pulmonary Rehab  Bicarbonate 20.0 - 28.0 mmol/L - - - 28.4  29.2  28.6  27.8   TCO2 22 - 32 mmol/L - - - 30  31  30  29    O2 Saturation % 72.4  65.2  73  59  57  73  72     Details       Multiple values from one day are sorted in reverse-chronological order         Capillary Blood Glucose: Lab Results  Component Value Date   GLUCAP 104 (H) 01/23/2023   GLUCAP 106 (H) 01/23/2023   GLUCAP 99 01/23/2023   GLUCAP  95 01/05/2023   GLUCAP 94 10/31/2022     Pulmonary Assessment Scores:  Pulmonary Assessment Scores     Row Name 01/17/23 0926         ADL UCSD   ADL Phase Entry     SOB Score total 13     Rest 0     Walk 1     Stairs 3     Bath 0     Dress 0     Shop 1       CAT Score   CAT Score 7       mMRC Score   mMRC Score 1             UCSD: Self-administered rating of dyspnea associated with activities of daily living (ADLs) 6-point scale (0 = "not at all" to 5 = "maximal or unable to do because of breathlessness")  Scoring Scores range from 0 to 120.  Minimally important difference is 5 units  CAT: CAT can identify the health impairment of COPD patients and is better correlated with disease progression.  CAT has a scoring range of zero to 40. The CAT score is classified into four groups of low (less than 10), medium (10 - 20), high (21-30) and very high (31-40) based on the impact level of disease on health status. A CAT score over 10 suggests significant symptoms.  A worsening CAT score could be explained by an exacerbation, poor medication adherence, poor inhaler technique, or progression of COPD or comorbid conditions.  CAT MCID is 2 points  mMRC: mMRC (Modified Medical Research Council) Dyspnea Scale is used to assess the degree of baseline functional disability in patients of respiratory disease due to dyspnea. No minimal important difference is established. A decrease in score of 1 point or greater is considered a positive change.   Pulmonary Function Assessment:   Exercise Target Goals: Exercise Program Goal: Individual exercise prescription set using results from initial 6 min walk test and THRR while considering  patient's activity barriers and safety.   Exercise Prescription Goal: Initial exercise prescription builds to 30-45 minutes a day of aerobic activity, 2-3 days per week.  Home exercise guidelines will be given to patient during program as part of  exercise prescription that the participant will acknowledge.  Activity Barriers & Risk Stratification:  Activity Barriers & Cardiac Risk Stratification - 01/09/23 1318       Activity Barriers & Cardiac Risk Stratification   Activity Barriers Other (comment);Deconditioning;Muscular Weakness;Shortness of Breath;Balance Concerns;Assistive Device    Comments gout  flare up             6 Minute Walk:  6 Minute Walk     Row Name 01/17/23 0852         6 Minute Walk   Phase Initial     Distance 850 feet     Walk Time 6 minutes     # of Rest Breaks 0     MPH 1.6     METS 1.61     RPE 12     Perceived Dyspnea  0     VO2 Peak 5.64     Symptoms Yes (comment)     Comments pt stated that RLE was developing gout so it was harder to walk     Resting HR 62 bpm     Resting BP 100/50     Resting Oxygen Saturation  100 %     Exercise Oxygen Saturation  during 6 min walk 100 %     Max Ex. HR 78 bpm     Max Ex. BP 140/52     2 Minute Post BP 130/52       Interval HR   1 Minute HR 64     2 Minute HR 75     3 Minute HR 77     4 Minute HR 78     5 Minute HR 76     6 Minute HR 78     2 Minute Post HR 66     Interval Heart Rate? Yes       Interval Oxygen   Interval Oxygen? Yes     Baseline Oxygen Saturation % 100 %     1 Minute Oxygen Saturation % 100 %     1 Minute Liters of Oxygen 4 L     2 Minute Oxygen Saturation % 100 %     2 Minute Liters of Oxygen 4 L     3 Minute Oxygen Saturation % 100 %     3 Minute Liters of Oxygen 4 L     4 Minute Oxygen Saturation % 100 %     4 Minute Liters of Oxygen 4 L     5 Minute Oxygen Saturation % 100 %     5 Minute Liters of Oxygen 4 L     6 Minute Oxygen Saturation % 100 %     6 Minute Liters of Oxygen 4 L     2 Minute Post Oxygen Saturation % 100 %     2 Minute Post Liters of Oxygen 4 L              Oxygen Initial Assessment:  Oxygen Initial Assessment - 01/17/23 0857       Home Oxygen   Home Oxygen Device E-Tanks;Home  Concentrator      Initial 6 min Walk   Oxygen Used Continuous    Liters per minute 4             Oxygen Re-Evaluation:   Oxygen Discharge (Final Oxygen Re-Evaluation):   Initial Exercise Prescription:  Initial Exercise Prescription - 01/17/23 0800       Date of Initial Exercise RX and Referring Provider   Date 01/17/23    Referring Provider Dr. Gasper Lloyd      Oxygen   Oxygen Continuous    Liters 4    Maintain Oxygen Saturation 88% or higher      NuStep   Level 2    SPM 80  Minutes 15    METs 1.9      Track   Laps 15    Minutes 15      Prescription Details   Frequency (times per week) 2    Duration Progress to 30 minutes of continuous aerobic without signs/symptoms of physical distress      Intensity   THRR 40-80% of Max Heartrate 98-134    Ratings of Perceived Exertion 11-13    Perceived Dyspnea 0-4      Resistance Training   Training Prescription Yes    Weight 3    Reps 10-15             Perform Capillary Blood Glucose checks as needed.  Exercise Prescription Changes:   Exercise Comments:   Exercise Goals and Review:   Exercise Goals     Row Name 01/17/23 0855             Exercise Goals   Increase Physical Activity Yes       Intervention Provide advice, education, support and counseling about physical activity/exercise needs.;Develop an individualized exercise prescription for aerobic and resistive training based on initial evaluation findings, risk stratification, comorbidities and participant's personal goals.       Expected Outcomes Short Term: Attend rehab on a regular basis to increase amount of physical activity.;Long Term: Add in home exercise to make exercise part of routine and to increase amount of physical activity.;Long Term: Exercising regularly at least 3-5 days a week.       Increase Strength and Stamina Yes       Intervention Provide advice, education, support and counseling about physical activity/exercise  needs.;Develop an individualized exercise prescription for aerobic and resistive training based on initial evaluation findings, risk stratification, comorbidities and participant's personal goals.       Expected Outcomes Short Term: Increase workloads from initial exercise prescription for resistance, speed, and METs.;Long Term: Improve cardiorespiratory fitness, muscular endurance and strength as measured by increased METs and functional capacity ( );Short Term: Perform resistance training exercises routinely during rehab and add in resistance training at home       Able to understand and use rate of perceived exertion (RPE) scale Yes       Intervention Provide education and explanation on how to use RPE scale       Expected Outcomes Short Term: Able to use RPE daily in rehab to express subjective intensity level;Long Term:  Able to use RPE to guide intensity level when exercising independently       Able to understand and use Dyspnea scale Yes       Intervention Provide education and explanation on how to use Dyspnea scale       Expected Outcomes Short Term: Able to use Dyspnea scale daily in rehab to express subjective sense of shortness of breath during exertion;Long Term: Able to use Dyspnea scale to guide intensity level when exercising independently       Knowledge and understanding of Target Heart Rate Range (THRR) Yes       Intervention Provide education and explanation of THRR including how the numbers were predicted and where they are located for reference       Expected Outcomes Short Term: Able to state/look up THRR;Long Term: Able to use THRR to govern intensity when exercising independently;Short Term: Able to use daily as guideline for intensity in rehab       Understanding of Exercise Prescription Yes       Intervention Provide education, explanation, and  written materials on patient's individual exercise prescription       Expected Outcomes Short Term: Able to explain program  exercise prescription;Long Term: Able to explain home exercise prescription to exercise independently                Exercise Goals Re-Evaluation :   Discharge Exercise Prescription (Final Exercise Prescription Changes):   Nutrition:  Target Goals: Understanding of nutrition guidelines, daily intake of sodium 1500mg , cholesterol 200mg , calories 30% from fat and 7% or less from saturated fats, daily to have 5 or more servings of fruits and vegetables.  Biometrics:  Pre Biometrics - 01/17/23 0856       Pre Biometrics   Height 5\' 6"  (1.676 m)    Weight 218 lb 4.1 oz (99 kg)    Waist Circumference 46 inches    Hip Circumference 48 inches    Waist to Hip Ratio 0.96 %    BMI (Calculated) 35.24    Grip Strength 22.5 kg    Flexibility 0 in    Single Leg Stand 0 seconds              Nutrition Therapy Plan and Nutrition Goals:  Nutrition Therapy & Goals - 01/09/23 1327       Intervention Plan   Intervention Prescribe, educate and counsel regarding individualized specific dietary modifications aiming towards targeted core components such as weight, hypertension, lipid management, diabetes, heart failure and other comorbidities.    Expected Outcomes Short Term Goal: Understand basic principles of dietary content, such as calories, fat, sodium, cholesterol and nutrients.             Nutrition Assessments:  MEDIFICTS Score Key: >=70 Need to make dietary changes  40-70 Heart Healthy Diet <= 40 Therapeutic Level Cholesterol Diet  Flowsheet Row PULMONARY REHAB OTHER RESP ORIENTATION from 01/17/2023 in [facility] CARDIAC REHABILITATION  Picture Your Plate Total Score on Admission 51      Picture Your Plate Scores: <16 Unhealthy dietary pattern with much room for improvement. 41-50 Dietary pattern unlikely to meet recommendations for good health and room for improvement. 51-60 More healthful dietary pattern, with some room for improvement.  >60 Healthy dietary  pattern, although there may be some specific behaviors that could be improved.    Nutrition Goals Re-Evaluation:   Nutrition Goals Discharge (Final Nutrition Goals Re-Evaluation):   Psychosocial: Target Goals: Acknowledge presence or absence of significant depression and/or stress, maximize coping skills, provide positive support system. Participant is able to verbalize types and ability to use techniques and skills needed for reducing stress and depression.  Initial Review & Psychosocial Screening:  Initial Psych Review & Screening - 01/09/23 1322       Initial Review   Current issues with Current Stress Concerns    Source of Stress Concerns Chronic Illness    Comments on oxygen      Family Dynamics   Good Support System? Yes   sister, oldest son, church family     Barriers   Psychosocial barriers to participate in program There are no identifiable barriers or psychosocial needs.      Screening Interventions   Interventions Encouraged to exercise;To provide support and resources with identified psychosocial needs;Provide feedback about the scores to participant    Expected Outcomes Short Term goal: Utilizing psychosocial counselor, staff and physician to assist with identification of specific Stressors or current issues interfering with healing process. Setting desired goal for each stressor or current issue identified.;Short Term goal: Identification and review  with participant of any Quality of Life or Depression concerns found by scoring the questionnaire.;Long Term Goal: Stressors or current issues are controlled or eliminated.;Long Term goal: The participant improves quality of Life and PHQ9 Scores as seen by post scores and/or verbalization of changes             Quality of Life Scores:  Scores of 19 and below usually indicate a poorer quality of life in these areas.  A difference of  2-3 points is a clinically meaningful difference.  A difference of 2-3 points in the  total score of the Quality of Life Index has been associated with significant improvement in overall quality of life, self-image, physical symptoms, and general health in studies assessing change in quality of life.   PHQ-9: Review Flowsheet       01/17/2023 08/13/2012  Depression screen PHQ 2/9  Decreased Interest 3 0  Down, Depressed, Hopeless 0 0  PHQ - 2 Score 3 0  Altered sleeping 0 -  Tired, decreased energy 0 -  Change in appetite 1 -  Feeling bad or failure about yourself  0 -  Trouble concentrating 0 -  Suicidal thoughts 0 -  PHQ-9 Score 4 -  Difficult doing work/chores Not difficult at all -    Details           Interpretation of Total Score  Total Score Depression Severity:  1-4 = Minimal depression, 5-9 = Mild depression, 10-14 = Moderate depression, 15-19 = Moderately severe depression, 20-27 = Severe depression   Psychosocial Evaluation and Intervention:  Psychosocial Evaluation - 01/09/23 1324       Psychosocial Evaluation & Interventions   Interventions Encouraged to exercise with the program and follow exercise prescription    Comments Alasia is coming into Pulmonary Rehab for Pulmonary Hypertension.  She is eager to feel better. She is motivated and wants to get her heart and lungs healthy and keep the fluid off.  She has a good support system and lives with her sister and her oldest son.  Her biggest barrier is that she lives in [place] and will have to drive from there.  She feels confident that she can do it.  She enjoys going to church and watching TV and movies.  She denies any major stressors other than her personal health and dealing with her oxygen needs.  She has an appt with oxygen company coming up to try to get set up with a portable concentrator.    Expected Outcomes short: Attend rehab to improve heart function Long: Exercise for stamina and mental boost.    Continue Psychosocial Services  Follow up required by staff             Psychosocial  Re-Evaluation:   Psychosocial Discharge (Final Psychosocial Re-Evaluation):    Education: Education Goals: Education classes will be provided on a weekly basis, covering required topics. Participant will state understanding/return demonstration of topics presented.  Learning Barriers/Preferences:  Learning Barriers/Preferences - 01/09/23 1319       Learning Barriers/Preferences   Learning Barriers Sight   glasses for reading   Learning Preferences Written Material;Skilled Demonstration             Education Topics: How Lungs Work and Diseases: - Discuss the anatomy of the lungs and diseases that can affect the lungs, such as COPD.   Exercise: -Discuss the importance of exercise, FITT principles of exercise, normal and abnormal responses to exercise, and how to exercise safely.   Environmental  Irritants: -Discuss types of environmental irritants and how to limit exposure to environmental irritants.   Meds/Inhalers and oxygen: - Discuss respiratory medications, definition of an inhaler and oxygen, and the proper way to use an inhaler and oxygen.   Energy Saving Techniques: - Discuss methods to conserve energy and decrease shortness of breath when performing activities of daily living.    Bronchial Hygiene / Breathing Techniques: - Discuss breathing mechanics, pursed-lip breathing technique,  proper posture, effective ways to clear airways, and other functional breathing techniques   Cleaning Equipment: - Provides group verbal and written instruction about the health risks of elevated stress, cause of high stress, and healthy ways to reduce stress.   Nutrition I: Fats: - Discuss the types of cholesterol, what cholesterol does to the body, and how cholesterol levels can be controlled.   Nutrition II: Labels: -Discuss the different components of food labels and how to read food labels.   Respiratory Infections: - Discuss the signs and symptoms of respiratory  infections, ways to prevent respiratory infections, and the importance of seeking medical treatment when having a respiratory infection.   Stress I: Signs and Symptoms: - Discuss the causes of stress, how stress may lead to anxiety and depression, and ways to limit stress.   Stress II: Relaxation: -Discuss relaxation techniques to limit stress.   Oxygen for Home/Travel: - Discuss how to prepare for travel when on oxygen and proper ways to transport and store oxygen to ensure safety.   Knowledge Questionnaire Score:  Knowledge Questionnaire Score - 01/17/23 0925       Knowledge Questionnaire Score   Pre Score 17/18             Core Components/Risk Factors/Patient Goals at Admission:  Personal Goals and Risk Factors at Admission - 01/09/23 1327       Core Components/Risk Factors/Patient Goals on Admission    Weight Management Yes;Obesity;Weight Loss    Intervention Weight Management: Develop a combined nutrition and exercise program designed to reach desired caloric intake, while maintaining appropriate intake of nutrient and fiber, sodium and fats, and appropriate energy expenditure required for the weight goal.;Weight Management: Provide education and appropriate resources to help participant work on and attain dietary goals.;Weight Management/Obesity: Establish reasonable short term and long term weight goals.;Obesity: Provide education and appropriate resources to help participant work on and attain dietary goals.    Expected Outcomes Short Term: Continue to assess and modify interventions until short term weight is achieved;Long Term: Adherence to nutrition and physical activity/exercise program aimed toward attainment of established weight goal;Weight Loss: Understanding of general recommendations for a balanced deficit meal plan, which promotes 1-2 lb weight loss per week and includes a negative energy balance of [phone] kcal/d;Understanding recommendations for meals to  include 15-35% energy as protein, 25-35% energy from fat, 35-60% energy from carbohydrates, less than 200mg  of dietary cholesterol, 20-35 gm of total fiber daily;Understanding of distribution of calorie intake throughout the day with the consumption of 4-5 meals/snacks    Improve shortness of breath with ADL's Yes    Intervention Provide education, individualized exercise plan and daily activity instruction to help decrease symptoms of SOB with activities of daily living.    Expected Outcomes Short Term: Improve cardiorespiratory fitness to achieve a reduction of symptoms when performing ADLs;Long Term: Be able to perform more ADLs without symptoms or delay the onset of symptoms    Increase knowledge of respiratory medications and ability to use respiratory devices properly  Yes    Intervention  Provide education and demonstration as needed of appropriate use of medications, inhalers, and oxygen therapy.    Expected Outcomes Long Term: Maintain appropriate use of medications, inhalers, and oxygen therapy.;Short Term: Achieves understanding of medications use. Understands that oxygen is a medication prescribed by physician. Demonstrates appropriate use of inhaler and oxygen therapy.    Diabetes Yes    Intervention Provide education about signs/symptoms and action to take for hypo/hyperglycemia.;Provide education about proper nutrition, including hydration, and aerobic/resistive exercise prescription along with prescribed medications to achieve blood glucose in normal ranges: Fasting glucose 65-99 mg/dL    Expected Outcomes Short Term: Participant verbalizes understanding of the signs/symptoms and immediate care of hyper/hypoglycemia, proper foot care and importance of medication, aerobic/resistive exercise and nutrition plan for blood glucose control.;Long Term: Attainment of HbA1C < 7%.    Heart Failure Yes    Intervention Provide a combined exercise and nutrition program that is supplemented with  education, support and counseling about heart failure. Directed toward relieving symptoms such as shortness of breath, decreased exercise tolerance, and extremity edema.    Expected Outcomes Improve functional capacity of life;Short term: Attendance in program 2-3 days a week with increased exercise capacity. Reported lower sodium intake. Reported increased fruit and vegetable intake. Reports medication compliance.;Short term: Daily weights obtained and reported for increase. Utilizing diuretic protocols set by physician.;Long term: Adoption of self-care skills and reduction of barriers for early signs and symptoms recognition and intervention leading to self-care maintenance.    Hypertension Yes    Intervention Provide education on lifestyle modifcations including regular physical activity/exercise, weight management, moderate sodium restriction and increased consumption of fresh fruit, vegetables, and low fat dairy, alcohol moderation, and smoking cessation.;Monitor prescription use compliance.    Expected Outcomes Short Term: Continued assessment and intervention until BP is < 140/43mm HG in hypertensive participants. < 130/46mm HG in hypertensive participants with diabetes, heart failure or chronic kidney disease.;Long Term: Maintenance of blood pressure at goal levels.    Lipids Yes    Intervention Provide education and support for participant on nutrition & aerobic/resistive exercise along with prescribed medications to achieve LDL 70mg , HDL >40mg .    Expected Outcomes Short Term: Participant states understanding of desired cholesterol values and is compliant with medications prescribed. Participant is following exercise prescription and nutrition guidelines.;Long Term: Cholesterol controlled with medications as prescribed, with individualized exercise RX and with personalized nutrition plan. Value goals: LDL < 70mg , HDL > 40 mg.             Core Components/Risk Factors/Patient Goals Review:     Core Components/Risk Factors/Patient Goals at Discharge (Final Review):    ITP Comments:  ITP Comments     Row Name 01/09/23 1330 01/23/23 1101 01/24/23 1505       ITP Comments Completed virtual orientation today.  EP evaluation is scheduled for 01/17/23 at 8am.  Documentation for diagnosis can be found in [facility] encounter 12/22/22. First full day of exercise!  Patient was oriented to gym and equipment including functions, settings, policies, and procedures.  Patient's individual exercise prescription and treatment plan were reviewed.  All starting workloads were established based on the results of the 6 minute walk test done at initial orientation visit.  The plan for exercise progression was also introduced and progression will be customized based on patient's performance and goals. 30 day review completed. ITP sent to Dr.Jehanzeb Memon, Medical Director of  Pulmonary Rehab. Continue with ITP unless changes are made by physician.  New to program  Comments: 30 day review

## 2023-01-24 NOTE — Patient Instructions (Signed)
It was a pleasure seeing you today!  MEDICATIONS: -No medication changes today -Call if you have questions about your medications.  LABS: -We will call you if your labs need attention.  NEXT APPOINTMENT: Return to clinic in 1 month with Dr. Gasper Lloyd.  In general, to take care of your heart failure: -Limit your fluid intake to 2 Liters (half-gallon) per day.   -Limit your salt intake to ideally 2-3 grams (2000-3000 mg) per day. -Weigh yourself daily and record, and bring that "weight diary" to your next appointment.  (Weight gain of 2-3 pounds in 1 day typically means fluid weight.) -The medications for your heart are to help your heart and help you live longer.   -Please contact us before stopping any of your heart medications.  Call the clinic at [phone] with questions or to reschedule future appointments.

## 2023-01-25 ENCOUNTER — Encounter (HOSPITAL_COMMUNITY)
Admission: RE | Admit: 2023-01-25 | Discharge: 2023-01-25 | Disposition: A | Discharge status: Home / Self Care | Payer: Medicare Other | Source: Ambulatory Visit | Attending: Cardiology | Admitting: Cardiology

## 2023-01-25 DIAGNOSIS — I272 Pulmonary hypertension, unspecified: Secondary | ICD-10-CM

## 2023-01-25 LAB — GLUCOSE, CAPILLARY
Glucose-Capillary: 95 mg/dL (ref 70–99)
Glucose-Capillary: 87 mg/dL (ref 70–99)

## 2023-01-25 NOTE — Progress Notes (Signed)
Daily Session Note  Patient Details  Name: Sandra Schneider MRN: [id number] Date of Birth: 09-Jun-1955 Referring Provider:   Flowsheet Row PULMONARY REHAB OTHER RESP ORIENTATION from 01/17/2023 in [facility] C[facility] RE[facility]  Referring Provider Dr. Gasper Lloyd       Encounter Date: 01/25/2023  Check In:  Session Check In - 01/25/23 1015       Check-In   Supervising physician immediately available to respond to emergencies [facility] MD immediately available    Physician(s) Dr. Diona Browner    Location [facility]    Staff Present Ross Ludwig, BS, Exercise Physiologist;Indra Wolters BSN, RN;Debra Laural Benes, RN, BSN    Virtual Visit No    Medication changes reported     No    Fall or balance concerns reported    No    Tobacco Cessation No Change    Warm-up and Cool-down Performed on first and last piece of equipment    Resistance Training Performed Yes    VAD Patient? No    PAD/SET Patient? No      Pain Assessment   Currently in Pain? No/denies    Pain Score 0-No pain    Multiple Pain Sites No             Capillary Blood Glucose: Results for orders placed or performed during the hospital encounter of 01/23/23 (from the past 24 hour(s))  Glucose, capillary     Status: None   Collection Time: 01/25/23 10:17 AM  Result Value Ref Range   Glucose-Capillary 95 70 - 99 mg/dL      Social History   Tobacco Use  Smoking Status Never   Passive exposure: Never  Smokeless Tobacco Never    Goals Met:  Proper associated with RPD/PD & O2 Sat Independence with exercise equipment Using PLB without cueing & demonstrates good technique Exercise tolerated well Queuing for purse lip breathing No report of concerns or symptoms today Strength training completed today  Goals Unmet:  Not Applicable  Comments: Marland KitchenMarland KitchenPt able to follow exercise prescription today without complaint.  Will continue to monitor for progression.    Dr. Erick Blinks is Medical  Director for [facility] Pulmonary Re[facility].

## 2023-01-28 DIAGNOSIS — E785 Hyperlipidemia, unspecified: Secondary | ICD-10-CM | POA: Diagnosis not present

## 2023-01-28 DIAGNOSIS — Z7984 Long term (current) use of oral hypoglycemic drugs: Secondary | ICD-10-CM | POA: Diagnosis not present

## 2023-01-28 DIAGNOSIS — Z7902 Long term (current) use of antithrombotics/antiplatelets: Secondary | ICD-10-CM | POA: Diagnosis not present

## 2023-01-28 DIAGNOSIS — Z7901 Long term (current) use of anticoagulants: Secondary | ICD-10-CM | POA: Diagnosis not present

## 2023-01-28 DIAGNOSIS — Z7982 Long term (current) use of aspirin: Secondary | ICD-10-CM | POA: Diagnosis not present

## 2023-01-28 DIAGNOSIS — I252 Old myocardial infarction: Secondary | ICD-10-CM | POA: Diagnosis not present

## 2023-01-28 DIAGNOSIS — I10 Essential (primary) hypertension: Secondary | ICD-10-CM | POA: Diagnosis not present

## 2023-01-28 DIAGNOSIS — M7732 Calcaneal spur, left foot: Secondary | ICD-10-CM | POA: Diagnosis not present

## 2023-01-28 DIAGNOSIS — I4891 Unspecified atrial fibrillation: Secondary | ICD-10-CM | POA: Diagnosis not present

## 2023-01-28 DIAGNOSIS — E119 Type 2 diabetes mellitus without complications: Secondary | ICD-10-CM | POA: Diagnosis not present

## 2023-01-28 DIAGNOSIS — M79672 Pain in left foot: Secondary | ICD-10-CM | POA: Diagnosis not present

## 2023-01-28 DIAGNOSIS — K219 Gastro-esophageal reflux disease without esophagitis: Secondary | ICD-10-CM | POA: Diagnosis not present

## 2023-01-28 DIAGNOSIS — Z79899 Other long term (current) drug therapy: Secondary | ICD-10-CM | POA: Diagnosis not present

## 2023-01-28 DIAGNOSIS — I2510 Atherosclerotic heart disease of native coronary artery without angina pectoris: Secondary | ICD-10-CM | POA: Diagnosis not present

## 2023-01-30 ENCOUNTER — Encounter (HOSPITAL_COMMUNITY): Payer: Medicare Other

## 2023-01-30 ENCOUNTER — Encounter (HOSPITAL_COMMUNITY): Payer: Self-pay | Admitting: *Deleted

## 2023-01-30 DIAGNOSIS — I272 Pulmonary hypertension, unspecified: Secondary | ICD-10-CM

## 2023-01-30 DIAGNOSIS — H01001 Unspecified blepharitis right upper eyelid: Secondary | ICD-10-CM | POA: Diagnosis not present

## 2023-01-30 DIAGNOSIS — H16143 Punctate keratitis, bilateral: Secondary | ICD-10-CM | POA: Diagnosis not present

## 2023-01-30 DIAGNOSIS — H40053 Ocular hypertension, bilateral: Secondary | ICD-10-CM | POA: Diagnosis not present

## 2023-01-30 DIAGNOSIS — H01002 Unspecified blepharitis right lower eyelid: Secondary | ICD-10-CM | POA: Diagnosis not present

## 2023-02-01 ENCOUNTER — Encounter (HOSPITAL_COMMUNITY): Payer: Medicare Other

## 2023-02-01 NOTE — Telephone Encounter (Signed)
Advanced Heart Failure Patient Advocate Encounter  Confirmed with [facility] that patient started Uptravi 09/12.  Archer Asa, CPhT

## 2023-02-04 ENCOUNTER — Other Ambulatory Visit: Payer: Self-pay | Admitting: Cardiology

## 2023-02-05 DIAGNOSIS — Z961 Presence of intraocular lens: Secondary | ICD-10-CM | POA: Diagnosis not present

## 2023-02-05 DIAGNOSIS — H31091 Other chorioretinal scars, right eye: Secondary | ICD-10-CM | POA: Diagnosis not present

## 2023-02-05 DIAGNOSIS — H35371 Puckering of macula, right eye: Secondary | ICD-10-CM | POA: Diagnosis not present

## 2023-02-05 DIAGNOSIS — E113393 Type 2 diabetes mellitus with moderate nonproliferative diabetic retinopathy without macular edema, bilateral: Secondary | ICD-10-CM | POA: Diagnosis not present

## 2023-02-05 DIAGNOSIS — H35033 Hypertensive retinopathy, bilateral: Secondary | ICD-10-CM | POA: Diagnosis not present

## 2023-02-06 ENCOUNTER — Encounter (HOSPITAL_COMMUNITY): Payer: Medicare Other

## 2023-02-06 DIAGNOSIS — M79672 Pain in left foot: Secondary | ICD-10-CM | POA: Diagnosis not present

## 2023-02-06 DIAGNOSIS — M79671 Pain in right foot: Secondary | ICD-10-CM | POA: Diagnosis not present

## 2023-02-06 DIAGNOSIS — M1990 Unspecified osteoarthritis, unspecified site: Secondary | ICD-10-CM | POA: Diagnosis not present

## 2023-02-08 ENCOUNTER — Telehealth: Payer: Self-pay | Admitting: Pharmacist

## 2023-02-08 ENCOUNTER — Encounter (HOSPITAL_COMMUNITY): Payer: Medicare Other

## 2023-02-08 MED ORDER — MOUNJARO 10 MG/0.5ML ~~LOC~~ SOAJ: 10.0000 mg | SUBCUTANEOUS | 0 refills | Status: DC

## 2023-02-08 NOTE — Telephone Encounter (Signed)
BG averaging around 100. Lost 5 lbs. Needs improvement in types of food patient eats.  Patient will work on eating healthy whole food. No side effects at the current dose Mounjaro 7.5 mg once week dose. In agreement to up  the dose and start eating healthy.

## 2023-02-12 DIAGNOSIS — I890 Lymphedema, not elsewhere classified: Secondary | ICD-10-CM | POA: Insufficient documentation

## 2023-02-12 DIAGNOSIS — E1142 Type 2 diabetes mellitus with diabetic polyneuropathy: Secondary | ICD-10-CM | POA: Insufficient documentation

## 2023-02-13 ENCOUNTER — Encounter (HOSPITAL_COMMUNITY): Payer: Medicare Other

## 2023-02-15 ENCOUNTER — Ambulatory Visit: Payer: Medicare Other | Admitting: Pulmonary Disease

## 2023-02-15 ENCOUNTER — Encounter (HOSPITAL_COMMUNITY): Payer: Medicare Other

## 2023-02-20 ENCOUNTER — Encounter (HOSPITAL_COMMUNITY): Payer: Medicare Other

## 2023-02-20 DIAGNOSIS — E785 Hyperlipidemia, unspecified: Secondary | ICD-10-CM | POA: Diagnosis not present

## 2023-02-20 DIAGNOSIS — I10 Essential (primary) hypertension: Secondary | ICD-10-CM | POA: Diagnosis not present

## 2023-02-20 DIAGNOSIS — E119 Type 2 diabetes mellitus without complications: Secondary | ICD-10-CM | POA: Diagnosis not present

## 2023-02-20 DIAGNOSIS — Z713 Dietary counseling and surveillance: Secondary | ICD-10-CM | POA: Diagnosis not present

## 2023-02-20 DIAGNOSIS — Z23 Encounter for immunization: Secondary | ICD-10-CM | POA: Diagnosis not present

## 2023-02-20 DIAGNOSIS — I252 Old myocardial infarction: Secondary | ICD-10-CM | POA: Diagnosis not present

## 2023-02-20 DIAGNOSIS — C50919 Malignant neoplasm of unspecified site of unspecified female breast: Secondary | ICD-10-CM | POA: Diagnosis not present

## 2023-02-20 DIAGNOSIS — I509 Heart failure, unspecified: Secondary | ICD-10-CM | POA: Diagnosis not present

## 2023-02-20 DIAGNOSIS — Z79899 Other long term (current) drug therapy: Secondary | ICD-10-CM | POA: Diagnosis not present

## 2023-02-21 ENCOUNTER — Encounter (HOSPITAL_COMMUNITY): Payer: Self-pay | Admitting: *Deleted

## 2023-02-21 DIAGNOSIS — R911 Solitary pulmonary nodule: Secondary | ICD-10-CM | POA: Diagnosis not present

## 2023-02-21 DIAGNOSIS — E119 Type 2 diabetes mellitus without complications: Secondary | ICD-10-CM | POA: Diagnosis not present

## 2023-02-21 DIAGNOSIS — E785 Hyperlipidemia, unspecified: Secondary | ICD-10-CM | POA: Diagnosis not present

## 2023-02-21 DIAGNOSIS — Z79899 Other long term (current) drug therapy: Secondary | ICD-10-CM | POA: Diagnosis not present

## 2023-02-21 DIAGNOSIS — I272 Pulmonary hypertension, unspecified: Secondary | ICD-10-CM

## 2023-02-21 DIAGNOSIS — I10 Essential (primary) hypertension: Secondary | ICD-10-CM | POA: Diagnosis not present

## 2023-02-21 DIAGNOSIS — Z170 Estrogen receptor positive status [ER+]: Secondary | ICD-10-CM | POA: Diagnosis not present

## 2023-02-21 DIAGNOSIS — C50411 Malignant neoplasm of upper-outer quadrant of right female breast: Secondary | ICD-10-CM | POA: Diagnosis not present

## 2023-02-21 NOTE — Progress Notes (Signed)
Pulmonary Individual Treatment Plan  Patient Details  Name: Sandra Schneider MRN: [id number] Date of Birth: 04/28/1956 Referring Provider:   Flowsheet Row PULMONARY REHAB OTHER RESP ORIENTATION from 01/17/2023 in [facility] CARDIAC REHABILITATION  Referring Provider Dr. Gasper Lloyd       Initial Encounter Date:  Flowsheet Row PULMONARY REHAB OTHER RESP ORIENTATION from 01/17/2023 in [facility] CARDIAC REHABILITATION  Date 01/17/23       Visit Diagnosis: Pulmonary hypertension (HCC)  Patient's Home Medications on Admission:   Current Outpatient Medications:    acetaminophen (TYLENOL) 325 MG tablet, Take 650 mg by mouth every 6 (six) hours as needed for moderate pain., Disp: , Rfl:    amiodarone (PACERONE) 200 MG tablet, Take 1 tablet (200 mg total) by mouth daily., Disp: 30 tablet, Rfl: 5   amLODipine (NORVASC) 5 MG tablet, Take 1 tablet (5 mg total) by mouth daily., Disp: 90 tablet, Rfl: 3   apixaban (ELIQUIS) 5 MG TABS tablet, Take 1 tablet (5 mg total) by mouth 2 (two) times daily., Disp: 60 tablet, Rfl: 5   Ascorbic Acid (VITAMIN C PO), Take 1 tablet by mouth daily., Disp: , Rfl:    atorvastatin (LIPITOR) 40 MG tablet, Take 1 tablet by mouth once daily, Disp: 90 tablet, Rfl: 2   Blood Pressure Monitor DEVI, 1 each by Does not apply route daily., Disp: 1 each, Rfl: 0   Carboxymethylcellul-Glycerin (REFRESH OPTIVE) 1-0.9 % GEL, Place 1 drop into both eyes as needed (dry eyes)., Disp: , Rfl:    cetirizine (ZYRTEC) 10 MG tablet, Take 10 mg by mouth daily., Disp: , Rfl:    Cholecalciferol (VITAMIN D) 50 MCG (2000 UT) tablet, Take 2,000 Units by mouth daily., Disp: , Rfl:    famotidine (PEPCID) 20 MG tablet, Take 20 mg by mouth daily., Disp: , Rfl:    letrozole (FEMARA) 2.5 MG tablet, Take 2.5 mg by mouth daily., Disp: , Rfl:    macitentan (OPSUMIT) 10 MG tablet, Take 10 mg by mouth daily., Disp: , Rfl:    Multiple Vitamin (MULTIVITAMIN) tablet, Take 1 tablet by mouth daily., Disp: ,  Rfl:    OXYGEN, Inhale 4 L into the lungs continuous., Disp: , Rfl:    Polyethyl Glycol-Propyl Glycol (SYSTANE) 0.4-0.3 % SOLN, Place 1 drop into both eyes as needed (dry eyes)., Disp: , Rfl:    potassium chloride (KLOR-CON M) 10 MEQ tablet, Take 2 tablets (20 mEq total) by mouth daily., Disp: 60 tablet, Rfl: 11   prednisoLONE acetate (PRED FORTE) 1 % ophthalmic suspension, Place 1 drop into both eyes 4 (four) times daily., Disp: , Rfl:    Selexipag (UPTRAVI TITRATION) 200 & 800 MCG TBPK, Take 200 mcg twice daily. Increase as directed up to 1600 mcg twice daily, Disp: , Rfl:    sildenafil (REVATIO) 20 MG tablet, Take 2 tablets (40 mg total) by mouth 3 (three) times daily., Disp: 180 tablet, Rfl: 5   tirzepatide (MOUNJARO) 10 MG/0.5ML Pen, Inject 10 mg into the skin once a week., Disp: 2 mL, Rfl: 0   torsemide (DEMADEX) 20 MG tablet, Take 2 tablets (40 mg total) by mouth 2 (two) times daily., Disp: 120 tablet, Rfl: 5  Past Medical History: Past Medical History:  Diagnosis Date   Breast cancer (HCC)    Coronary artery disease    Diabetes mellitus    GERD (gastroesophageal reflux disease)    Hypertension    Myocardial abscess    Myocardial infarct (HCC) 06/24/2012   Pulmonary hypertension (HCC)  Tobacco Use: Social History   Tobacco Use  Smoking Status Never   Passive exposure: Never  Smokeless Tobacco Never    Labs: Review Flowsheet  More data exists      Latest Ref Rng & Units 10/05/2022 10/06/2022 10/07/2022 10/31/2022 01/05/2023  Labs for ITP Cardiac and Pulmonary Rehab  Bicarbonate 20.0 - 28.0 mmol/L - - - 28.4  29.2  28.6  27.8   TCO2 22 - 32 mmol/L - - - 30  31  30  29    O2 Saturation % 72.4  65.2  73  59  57  73  72     Details       Multiple values from one day are sorted in reverse-chronological order         Capillary Blood Glucose: Lab Results  Component Value Date   GLUCAP 87 01/25/2023   GLUCAP 95 01/25/2023   GLUCAP 104 (H) 01/23/2023   GLUCAP 106  (H) 01/23/2023   GLUCAP 99 01/23/2023     Pulmonary Assessment Scores:  Pulmonary Assessment Scores     Row Name 01/17/23 0926         ADL UCSD   ADL Phase Entry     SOB Score total 13     Rest 0     Walk 1     Stairs 3     Bath 0     Dress 0     Shop 1       CAT Score   CAT Score 7       mMRC Score   mMRC Score 1             UCSD: Self-administered rating of dyspnea associated with activities of daily living (ADLs) 6-point scale (0 = "not at all" to 5 = "maximal or unable to do because of breathlessness")  Scoring Scores range from 0 to 120.  Minimally important difference is 5 units  CAT: CAT can identify the health impairment of COPD patients and is better correlated with disease progression.  CAT has a scoring range of zero to 40. The CAT score is classified into four groups of low (less than 10), medium (10 - 20), high (21-30) and very high (31-40) based on the impact level of disease on health status. A CAT score over 10 suggests significant symptoms.  A worsening CAT score could be explained by an exacerbation, poor medication adherence, poor inhaler technique, or progression of COPD or comorbid conditions.  CAT MCID is 2 points  mMRC: mMRC (Modified Medical Research Council) Dyspnea Scale is used to assess the degree of baseline functional disability in patients of respiratory disease due to dyspnea. No minimal important difference is established. A decrease in score of 1 point or greater is considered a positive change.   Pulmonary Function Assessment:   Exercise Target Goals: Exercise Program Goal: Individual exercise prescription set using results from initial 6 min walk test and THRR while considering  patient's activity barriers and safety.   Exercise Prescription Goal: Initial exercise prescription builds to 30-45 minutes a day of aerobic activity, 2-3 days per week.  Home exercise guidelines will be given to patient during program as part of  exercise prescription that the participant will acknowledge.  Activity Barriers & Risk Stratification:  Activity Barriers & Cardiac Risk Stratification - 01/09/23 1318       Activity Barriers & Cardiac Risk Stratification   Activity Barriers Other (comment);Deconditioning;Muscular Weakness;Shortness of Breath;Balance Concerns;Assistive Device    Comments gout  flare up             6 Minute Walk:  6 Minute Walk     Row Name 01/17/23 0852         6 Minute Walk   Phase Initial     Distance 850 feet     Walk Time 6 minutes     # of Rest Breaks 0     MPH 1.6     METS 1.61     RPE 12     Perceived Dyspnea  0     VO2 Peak 5.64     Symptoms Yes (comment)     Comments pt stated that RLE was developing gout so it was harder to walk     Resting HR 62 bpm     Resting BP 100/50     Resting Oxygen Saturation  100 %     Exercise Oxygen Saturation  during 6 min walk 100 %     Max Ex. HR 78 bpm     Max Ex. BP 140/52     2 Minute Post BP 130/52       Interval HR   1 Minute HR 64     2 Minute HR 75     3 Minute HR 77     4 Minute HR 78     5 Minute HR 76     6 Minute HR 78     2 Minute Post HR 66     Interval Heart Rate? Yes       Interval Oxygen   Interval Oxygen? Yes     Baseline Oxygen Saturation % 100 %     1 Minute Oxygen Saturation % 100 %     1 Minute Liters of Oxygen 4 L     2 Minute Oxygen Saturation % 100 %     2 Minute Liters of Oxygen 4 L     3 Minute Oxygen Saturation % 100 %     3 Minute Liters of Oxygen 4 L     4 Minute Oxygen Saturation % 100 %     4 Minute Liters of Oxygen 4 L     5 Minute Oxygen Saturation % 100 %     5 Minute Liters of Oxygen 4 L     6 Minute Oxygen Saturation % 100 %     6 Minute Liters of Oxygen 4 L     2 Minute Post Oxygen Saturation % 100 %     2 Minute Post Liters of Oxygen 4 L              Oxygen Initial Assessment:  Oxygen Initial Assessment - 01/17/23 0857       Home Oxygen   Home Oxygen Device E-Tanks;Home  Concentrator      Initial 6 min Walk   Oxygen Used Continuous    Liters per minute 4             Oxygen Re-Evaluation:   Oxygen Discharge (Final Oxygen Re-Evaluation):   Initial Exercise Prescription:  Initial Exercise Prescription - 01/17/23 0800       Date of Initial Exercise RX and Referring Provider   Date 01/17/23    Referring Provider Dr. Gasper Lloyd      Oxygen   Oxygen Continuous    Liters 4    Maintain Oxygen Saturation 88% or higher      NuStep   Level 2    SPM 80  Minutes 15    METs 1.9      Track   Laps 15    Minutes 15      Prescription Details   Frequency (times per week) 2    Duration Progress to 30 minutes of continuous aerobic without signs/symptoms of physical distress      Intensity   THRR 40-80% of Max Heartrate 98-134    Ratings of Perceived Exertion 11-13    Perceived Dyspnea 0-4      Resistance Training   Training Prescription Yes    Weight 3    Reps 10-15             Perform Capillary Blood Glucose checks as needed.  Exercise Prescription Changes:   Exercise Comments:   Exercise Goals and Review:   Exercise Goals     Row Name 01/17/23 0855             Exercise Goals   Increase Physical Activity Yes       Intervention Provide advice, education, support and counseling about physical activity/exercise needs.;Develop an individualized exercise prescription for aerobic and resistive training based on initial evaluation findings, risk stratification, comorbidities and participant's personal goals.       Expected Outcomes Short Term: Attend rehab on a regular basis to increase amount of physical activity.;Long Term: Add in home exercise to make exercise part of routine and to increase amount of physical activity.;Long Term: Exercising regularly at least 3-5 days a week.       Increase Strength and Stamina Yes       Intervention Provide advice, education, support and counseling about physical activity/exercise  needs.;Develop an individualized exercise prescription for aerobic and resistive training based on initial evaluation findings, risk stratification, comorbidities and participant's personal goals.       Expected Outcomes Short Term: Increase workloads from initial exercise prescription for resistance, speed, and METs.;Long Term: Improve cardiorespiratory fitness, muscular endurance and strength as measured by increased METs and functional capacity ( );Short Term: Perform resistance training exercises routinely during rehab and add in resistance training at home       Able to understand and use rate of perceived exertion (RPE) scale Yes       Intervention Provide education and explanation on how to use RPE scale       Expected Outcomes Short Term: Able to use RPE daily in rehab to express subjective intensity level;Long Term:  Able to use RPE to guide intensity level when exercising independently       Able to understand and use Dyspnea scale Yes       Intervention Provide education and explanation on how to use Dyspnea scale       Expected Outcomes Short Term: Able to use Dyspnea scale daily in rehab to express subjective sense of shortness of breath during exertion;Long Term: Able to use Dyspnea scale to guide intensity level when exercising independently       Knowledge and understanding of Target Heart Rate Range (THRR) Yes       Intervention Provide education and explanation of THRR including how the numbers were predicted and where they are located for reference       Expected Outcomes Short Term: Able to state/look up THRR;Long Term: Able to use THRR to govern intensity when exercising independently;Short Term: Able to use daily as guideline for intensity in rehab       Understanding of Exercise Prescription Yes       Intervention Provide education, explanation, and  written materials on patient's individual exercise prescription       Expected Outcomes Short Term: Able to explain program  exercise prescription;Long Term: Able to explain home exercise prescription to exercise independently                Exercise Goals Re-Evaluation :   Discharge Exercise Prescription (Final Exercise Prescription Changes):   Nutrition:  Target Goals: Understanding of nutrition guidelines, daily intake of sodium 1500mg , cholesterol 200mg , calories 30% from fat and 7% or less from saturated fats, daily to have 5 or more servings of fruits and vegetables.  Biometrics:  Pre Biometrics - 01/17/23 0856       Pre Biometrics   Height 5\' 6"  (1.676 m)    Weight 218 lb 4.1 oz (99 kg)    Waist Circumference 46 inches    Hip Circumference 48 inches    Waist to Hip Ratio 0.96 %    BMI (Calculated) 35.24    Grip Strength 22.5 kg    Flexibility 0 in    Single Leg Stand 0 seconds              Nutrition Therapy Plan and Nutrition Goals:  Nutrition Therapy & Goals - 01/09/23 1327       Intervention Plan   Intervention Prescribe, educate and counsel regarding individualized specific dietary modifications aiming towards targeted core components such as weight, hypertension, lipid management, diabetes, heart failure and other comorbidities.    Expected Outcomes Short Term Goal: Understand basic principles of dietary content, such as calories, fat, sodium, cholesterol and nutrients.             Nutrition Assessments:  MEDIFICTS Score Key: >=70 Need to make dietary changes  40-70 Heart Healthy Diet <= 40 Therapeutic Level Cholesterol Diet  Flowsheet Row PULMONARY REHAB OTHER RESP ORIENTATION from 01/17/2023 in [facility] CARDIAC REHABILITATION  Picture Your Plate Total Score on Admission 51      Picture Your Plate Scores: <62 Unhealthy dietary pattern with much room for improvement. 41-50 Dietary pattern unlikely to meet recommendations for good health and room for improvement. 51-60 More healthful dietary pattern, with some room for improvement.  >60 Healthy dietary  pattern, although there may be some specific behaviors that could be improved.    Nutrition Goals Re-Evaluation:   Nutrition Goals Discharge (Final Nutrition Goals Re-Evaluation):   Psychosocial: Target Goals: Acknowledge presence or absence of significant depression and/or stress, maximize coping skills, provide positive support system. Participant is able to verbalize types and ability to use techniques and skills needed for reducing stress and depression.  Initial Review & Psychosocial Screening:  Initial Psych Review & Screening - 01/09/23 1322       Initial Review   Current issues with Current Stress Concerns    Source of Stress Concerns Chronic Illness    Comments on oxygen      Family Dynamics   Good Support System? Yes   sister, oldest son, church family     Barriers   Psychosocial barriers to participate in program There are no identifiable barriers or psychosocial needs.      Screening Interventions   Interventions Encouraged to exercise;To provide support and resources with identified psychosocial needs;Provide feedback about the scores to participant    Expected Outcomes Short Term goal: Utilizing psychosocial counselor, staff and physician to assist with identification of specific Stressors or current issues interfering with healing process. Setting desired goal for each stressor or current issue identified.;Short Term goal: Identification and review  with participant of any Quality of Life or Depression concerns found by scoring the questionnaire.;Long Term Goal: Stressors or current issues are controlled or eliminated.;Long Term goal: The participant improves quality of Life and PHQ9 Scores as seen by post scores and/or verbalization of changes             Quality of Life Scores:  Scores of 19 and below usually indicate a poorer quality of life in these areas.  A difference of  2-3 points is a clinically meaningful difference.  A difference of 2-3 points in the  total score of the Quality of Life Index has been associated with significant improvement in overall quality of life, self-image, physical symptoms, and general health in studies assessing change in quality of life.   PHQ-9: Review Flowsheet       01/17/2023 08/13/2012  Depression screen PHQ 2/9  Decreased Interest 3 0  Down, Depressed, Hopeless 0 0  PHQ - 2 Score 3 0  Altered sleeping 0 -  Tired, decreased energy 0 -  Change in appetite 1 -  Feeling bad or failure about yourself  0 -  Trouble concentrating 0 -  Suicidal thoughts 0 -  PHQ-9 Score 4 -  Difficult doing work/chores Not difficult at all -    Details           Interpretation of Total Score  Total Score Depression Severity:  1-4 = Minimal depression, 5-9 = Mild depression, 10-14 = Moderate depression, 15-19 = Moderately severe depression, 20-27 = Severe depression   Psychosocial Evaluation and Intervention:  Psychosocial Evaluation - 01/09/23 1324       Psychosocial Evaluation & Interventions   Interventions Encouraged to exercise with the program and follow exercise prescription    Comments Mickayla is coming into Pulmonary Rehab for Pulmonary Hypertension.  She is eager to feel better. She is motivated and wants to get her heart and lungs healthy and keep the fluid off.  She has a good support system and lives with her sister and her oldest son.  Her biggest barrier is that she lives in [place] and will have to drive from there.  She feels confident that she can do it.  She enjoys going to church and watching TV and movies.  She denies any major stressors other than her personal health and dealing with her oxygen needs.  She has an appt with oxygen company coming up to try to get set up with a portable concentrator.    Expected Outcomes short: Attend rehab to improve heart function Long: Exercise for stamina and mental boost.    Continue Psychosocial Services  Follow up required by staff             Psychosocial  Re-Evaluation:   Psychosocial Discharge (Final Psychosocial Re-Evaluation):    Education: Education Goals: Education classes will be provided on a weekly basis, covering required topics. Participant will state understanding/return demonstration of topics presented.  Learning Barriers/Preferences:  Learning Barriers/Preferences - 01/09/23 1319       Learning Barriers/Preferences   Learning Barriers Sight   glasses for reading   Learning Preferences Written Material;Skilled Demonstration             Education Topics: How Lungs Work and Diseases: - Discuss the anatomy of the lungs and diseases that can affect the lungs, such as COPD.   Exercise: -Discuss the importance of exercise, FITT principles of exercise, normal and abnormal responses to exercise, and how to exercise safely.   Environmental  Irritants: -Discuss types of environmental irritants and how to limit exposure to environmental irritants.   Meds/Inhalers and oxygen: - Discuss respiratory medications, definition of an inhaler and oxygen, and the proper way to use an inhaler and oxygen.   Energy Saving Techniques: - Discuss methods to conserve energy and decrease shortness of breath when performing activities of daily living.    Bronchial Hygiene / Breathing Techniques: - Discuss breathing mechanics, pursed-lip breathing technique,  proper posture, effective ways to clear airways, and other functional breathing techniques   Cleaning Equipment: - Provides group verbal and written instruction about the health risks of elevated stress, cause of high stress, and healthy ways to reduce stress.   Nutrition I: Fats: - Discuss the types of cholesterol, what cholesterol does to the body, and how cholesterol levels can be controlled.   Nutrition II: Labels: -Discuss the different components of food labels and how to read food labels.   Respiratory Infections: - Discuss the signs and symptoms of respiratory  infections, ways to prevent respiratory infections, and the importance of seeking medical treatment when having a respiratory infection.   Stress I: Signs and Symptoms: - Discuss the causes of stress, how stress may lead to anxiety and depression, and ways to limit stress.   Stress II: Relaxation: -Discuss relaxation techniques to limit stress.   Oxygen for Home/Travel: - Discuss how to prepare for travel when on oxygen and proper ways to transport and store oxygen to ensure safety.   Knowledge Questionnaire Score:  Knowledge Questionnaire Score - 01/17/23 0925       Knowledge Questionnaire Score   Pre Score 17/18             Core Components/Risk Factors/Patient Goals at Admission:  Personal Goals and Risk Factors at Admission - 01/09/23 1327       Core Components/Risk Factors/Patient Goals on Admission    Weight Management Yes;Obesity;Weight Loss    Intervention Weight Management: Develop a combined nutrition and exercise program designed to reach desired caloric intake, while maintaining appropriate intake of nutrient and fiber, sodium and fats, and appropriate energy expenditure required for the weight goal.;Weight Management: Provide education and appropriate resources to help participant work on and attain dietary goals.;Weight Management/Obesity: Establish reasonable short term and long term weight goals.;Obesity: Provide education and appropriate resources to help participant work on and attain dietary goals.    Expected Outcomes Short Term: Continue to assess and modify interventions until short term weight is achieved;Long Term: Adherence to nutrition and physical activity/exercise program aimed toward attainment of established weight goal;Weight Loss: Understanding of general recommendations for a balanced deficit meal plan, which promotes 1-2 lb weight loss per week and includes a negative energy balance of [phone] kcal/d;Understanding recommendations for meals to  include 15-35% energy as protein, 25-35% energy from fat, 35-60% energy from carbohydrates, less than 200mg  of dietary cholesterol, 20-35 gm of total fiber daily;Understanding of distribution of calorie intake throughout the day with the consumption of 4-5 meals/snacks    Improve shortness of breath with ADL's Yes    Intervention Provide education, individualized exercise plan and daily activity instruction to help decrease symptoms of SOB with activities of daily living.    Expected Outcomes Short Term: Improve cardiorespiratory fitness to achieve a reduction of symptoms when performing ADLs;Long Term: Be able to perform more ADLs without symptoms or delay the onset of symptoms    Increase knowledge of respiratory medications and ability to use respiratory devices properly  Yes    Intervention  Provide education and demonstration as needed of appropriate use of medications, inhalers, and oxygen therapy.    Expected Outcomes Long Term: Maintain appropriate use of medications, inhalers, and oxygen therapy.;Short Term: Achieves understanding of medications use. Understands that oxygen is a medication prescribed by physician. Demonstrates appropriate use of inhaler and oxygen therapy.    Diabetes Yes    Intervention Provide education about signs/symptoms and action to take for hypo/hyperglycemia.;Provide education about proper nutrition, including hydration, and aerobic/resistive exercise prescription along with prescribed medications to achieve blood glucose in normal ranges: Fasting glucose 65-99 mg/dL    Expected Outcomes Short Term: Participant verbalizes understanding of the signs/symptoms and immediate care of hyper/hypoglycemia, proper foot care and importance of medication, aerobic/resistive exercise and nutrition plan for blood glucose control.;Long Term: Attainment of HbA1C < 7%.    Heart Failure Yes    Intervention Provide a combined exercise and nutrition program that is supplemented with  education, support and counseling about heart failure. Directed toward relieving symptoms such as shortness of breath, decreased exercise tolerance, and extremity edema.    Expected Outcomes Improve functional capacity of life;Short term: Attendance in program 2-3 days a week with increased exercise capacity. Reported lower sodium intake. Reported increased fruit and vegetable intake. Reports medication compliance.;Short term: Daily weights obtained and reported for increase. Utilizing diuretic protocols set by physician.;Long term: Adoption of self-care skills and reduction of barriers for early signs and symptoms recognition and intervention leading to self-care maintenance.    Hypertension Yes    Intervention Provide education on lifestyle modifcations including regular physical activity/exercise, weight management, moderate sodium restriction and increased consumption of fresh fruit, vegetables, and low fat dairy, alcohol moderation, and smoking cessation.;Monitor prescription use compliance.    Expected Outcomes Short Term: Continued assessment and intervention until BP is < 140/33mm HG in hypertensive participants. < 130/73mm HG in hypertensive participants with diabetes, heart failure or chronic kidney disease.;Long Term: Maintenance of blood pressure at goal levels.    Lipids Yes    Intervention Provide education and support for participant on nutrition & aerobic/resistive exercise along with prescribed medications to achieve LDL 70mg , HDL >40mg .    Expected Outcomes Short Term: Participant states understanding of desired cholesterol values and is compliant with medications prescribed. Participant is following exercise prescription and nutrition guidelines.;Long Term: Cholesterol controlled with medications as prescribed, with individualized exercise RX and with personalized nutrition plan. Value goals: LDL < 70mg , HDL > 40 mg.             Core Components/Risk Factors/Patient Goals Review:     Core Components/Risk Factors/Patient Goals at Discharge (Final Review):    ITP Comments:  ITP Comments     Row Name 01/09/23 1330 01/23/23 1101 01/24/23 1505 01/30/23 1001 02/21/23 1558   ITP Comments Completed virtual orientation today.  EP evaluation is scheduled for 01/17/23 at 8am.  Documentation for diagnosis can be found in [facility] encounter 12/22/22. First full day of exercise!  Patient was oriented to gym and equipment including functions, settings, policies, and procedures.  Patient's individual exercise prescription and treatment plan were reviewed.  All starting workloads were established based on the results of the 6 minute walk test done at initial orientation visit.  The plan for exercise progression was also introduced and progression will be customized based on patient's performance and goals. 30 day review completed. ITP sent to Dr.Jehanzeb Memon, Medical Director of  Pulmonary Rehab. Continue with ITP unless changes are made by physician.  New to program Pt called  out with tendonitis in her foot.  She is unable to put full weight on her foot.  She has an appointment on Tuesday and hopes to return next Thursday. 30 day review completed. ITP sent to Dr.Jehanzeb Memon, Medical Director of  Pulmonary Rehab. Continue with ITP unless changes are made by physician.  Pt has not attended since 01/25/23.  Unable to assess for goals this round.            Comments: 30 day review

## 2023-02-22 ENCOUNTER — Encounter (HOSPITAL_COMMUNITY): Payer: Medicare Other

## 2023-02-22 DIAGNOSIS — Z170 Estrogen receptor positive status [ER+]: Secondary | ICD-10-CM | POA: Diagnosis not present

## 2023-02-22 DIAGNOSIS — C50411 Malignant neoplasm of upper-outer quadrant of right female breast: Secondary | ICD-10-CM | POA: Diagnosis not present

## 2023-02-25 NOTE — Progress Notes (Incomplete)
ADVANCED HEART FAILURE CLINIC NOTE  Referring Physician: Wilmon Pali, FNP  Primary Care: Wilmon Pali, FNP Primary Cardiologist: Dr. Dina Rich  HPI: Erandi Lemma is a 67 y.o. female with CAD (NSTEMI 06/2012 w/ PCI to LAD and Lcx) LVEF 55% at that time, OSA, HTN, hyperlipidemia, hx of breast ca s/p lumpectomy and persistent shortness of breath and lower extremity edema presenting today for follow up. She also follows Dr. Vassie Loll for OSA.  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  She has noticed that over the past several months she has had worsening lower extremity edema and difficulty ambulating due to shortness of breath.  She had a echocardiogram concerning for pulmonary hypertension followed by right heart cath with a mean PA of 69 mmHg.  Her cardiac output was 7 L/min during that case.  She had a workup for interstitial lung disease by pulmonology with CT chest that was negative.  However there was a right upper lobe lung nodule with negative PET scan.  In addition she has had a colonoscopy and mammogram due to hypermetabolic areas in the colon and breast both of which were also negative.   Since her initial appointment we started her on sildenafil 20 mg 3 times daily.  Unfortunately due to continued dietary indiscretion, significant volume intake and medication noncompliance she presented severely hypervolemic during her last appointment in May 2024.  She was directly admitted to the hospital and diuresed 35 pounds during that admission.  Right heart cath confirmed severe precapillary pulmonary hypertension.  Sildenafil was increased to 40 mg 3 times daily.  Interval hx:  Seen by pharmD last month; now on Uptravi. She is also participating in pulmonary rehab.   Activity level/exercise tolerance: NYHA IIB-III, improving.  Orthopnea:  Sleeps on 1; previously 3. Uses CPAP.  Paroxysmal noctural dyspnea: Yes Chest pain/pressure:  No Orthostatic  lightheadedness:  No Palpitations:  No Lower extremity edema:  Minimal Presyncope/syncope:  No Cough:  Infrequent but yes  Past Medical History:  Diagnosis Date   Breast cancer (HCC)    Coronary artery disease    Diabetes mellitus    GERD (gastroesophageal reflux disease)    Hypertension    Myocardial abscess    Myocardial infarct (HCC) 06/24/2012   Pulmonary hypertension (HCC)     Current Outpatient Medications  Medication Sig Dispense Refill   acetaminophen (TYLENOL) 325 MG tablet Take 650 mg by mouth every 6 (six) hours as needed for moderate pain.     amiodarone (PACERONE) 200 MG tablet Take 1 tablet (200 mg total) by mouth daily. 30 tablet 5   amLODipine (NORVASC) 5 MG tablet Take 1 tablet (5 mg total) by mouth daily. 90 tablet 3   apixaban (ELIQUIS) 5 MG TABS tablet Take 1 tablet (5 mg total) by mouth 2 (two) times daily. 60 tablet 5   Ascorbic Acid (VITAMIN C PO) Take 1 tablet by mouth daily.     atorvastatin (LIPITOR) 40 MG tablet Take 1 tablet by mouth once daily 90 tablet 2   Blood Pressure Monitor DEVI 1 each by Does not apply route daily. 1 each 0   Carboxymethylcellul-Glycerin (REFRESH OPTIVE) 1-0.9 % GEL Place 1 drop into both eyes as needed (dry eyes).     cetirizine (ZYRTEC) 10 MG tablet Take 10 mg by mouth daily.     Cholecalciferol (VITAMIN D) 50 MCG (2000 UT) tablet Take 2,000 Units by mouth daily.     famotidine (PEPCID) 20 MG  tablet Take 20 mg by mouth daily.     letrozole (FEMARA) 2.5 MG tablet Take 2.5 mg by mouth daily.     macitentan (OPSUMIT) 10 MG tablet Take 10 mg by mouth daily.     Multiple Vitamin (MULTIVITAMIN) tablet Take 1 tablet by mouth daily.     OXYGEN Inhale 4 L into the lungs continuous.     Polyethyl Glycol-Propyl Glycol (SYSTANE) 0.4-0.3 % SOLN Place 1 drop into both eyes as needed (dry eyes).     potassium chloride (KLOR-CON M) 10 MEQ tablet Take 2 tablets (20 mEq total) by mouth daily. 60 tablet 11   prednisoLONE acetate (PRED FORTE) 1  % ophthalmic suspension Place 1 drop into both eyes 4 (four) times daily.     Selexipag (UPTRAVI TITRATION) 200 & 800 MCG TBPK Take 200 mcg twice daily. Increase as directed up to 1600 mcg twice daily     sildenafil (REVATIO) 20 MG tablet Take 2 tablets (40 mg total) by mouth 3 (three) times daily. 180 tablet 5   tirzepatide (MOUNJARO) 10 MG/0.5ML Pen Inject 10 mg into the skin once a week. 2 mL 0   torsemide (DEMADEX) 20 MG tablet Take 2 tablets (40 mg total) by mouth 2 (two) times daily. 120 tablet 5   No current facility-administered medications for this visit.    No Known Allergies    Social History   Socioeconomic History   Marital status: Widowed    Spouse name: Not on file   Number of children: Not on file   Years of education: Not on file   Highest education level: Not on file  Occupational History   Not on file  Tobacco Use   Smoking status: Never    Passive exposure: Never   Smokeless tobacco: Never  Vaping Use   Vaping status: Never Used  Substance and Sexual Activity   Alcohol use: No   Drug use: No   Sexual activity: Yes    Birth control/protection: Post-menopausal  Other Topics Concern   Not on file  Social History Narrative   Not on file   Social Determinants of Health   Financial Resource Strain: Low Risk  (08/22/2022)   Received from [facility], [facility]   Overall Financial Resource Strain (CARDIA)    Difficulty of Paying Living Expenses: Not hard at all  Food Insecurity: No Food Insecurity (09/27/2022)   Hunger Vital Sign    Worried About Running Out of Food in the Last Year: Never true    Ran Out of Food in the Last Year: Never true  Transportation Needs: No Transportation Needs (09/27/2022)   PRAPARE - Administrator, Civil Service (Medical): No    Lack of Transportation (Non-Medical): No  Physical Activity: Insufficiently Active (08/22/2022)   Received from [facility], [facility]   Exercise Vital Sign    Days of  Exercise per Week: 3 days    Minutes of Exercise per Session: 30 min  Stress: No Stress Concern Present (08/22/2022)   Received from [facility], [facility] of Occupational Health - Occupational Stress Questionnaire    Feeling of Stress : Only a little  Social Connections: Moderately Isolated (08/22/2022)   Received from [facility], [facility]   Social Connection and Isolation Panel [NHANES]    Frequency of Communication with Friends and Family: More than three times a week    Frequency of Social Gatherings with  Friends and Family: More than three times a week    Attends Religious Services: More than 4 times per year    Active Member of Clubs or Organizations: No    Attends Banker Meetings: Never    Marital Status: Widowed  Intimate Partner Violence: Not At Risk (09/27/2022)   Humiliation, Afraid, Rape, and Kick questionnaire    Fear of Current or Ex-Partner: No    Emotionally Abused: No    Physically Abused: No    Sexually Abused: No      Family History  Problem Relation Age of Onset   Heart disease Father    Diabetes Other    Hypertension Other    Cancer Other    Colon cancer Son 22       alive, doing well.    PHYSICAL EXAM: There were no vitals filed for this visit. GENERAL: Well nourished, well developed, and in no apparent distress at rest.  HEENT: Negative for arcus senilis or xanthelasma. There is no scleral icterus.  The mucous membranes are pink and moist.   NECK: Supple, No masses. Normal carotid upstrokes without bruits. No masses or thyromegaly.    CHEST: There are no chest wall deformities. There is no chest wall tenderness. Respirations are unlabored.  Lungs- *** CARDIAC:  JVP: *** cm          Normal rate with regular rhythm. No murmurs, rubs or gallops.  Pulses are 2+ and symmetrical in upper and lower extremities. *** edema.  ABDOMEN: Soft, non-tender, non-distended. There are no masses or hepatomegaly. There  are normal bowel sounds.  EXTREMITIES: Warm and well perfused with no cyanosis, clubbing.  LYMPHATIC: No axillary or supraclavicular lymphadenopathy.  NEUROLOGIC: Patient is oriented x3 with no focal or lateralizing neurologic deficits.  PSYCH: Patients affect is appropriate, there is no evidence of anxiety or depression.  SKIN: Warm and dry; no lesions or wounds.      DATA REVIEW  ECG:06/13/22: NSR with 1AVB  ECHO: 01/18/22: LVBEF 60-65%. Moderately dilated RV with mild reduction in function. Dilated RA.  CATH: 02/08/23:   Prox Cx to Dist Cx lesion is 25% stenosed.   Mid LAD lesion is 25% stenosed.   2nd Mrg lesion is 100% stenosed.  Right to left collaterals.  Left to left collaterals.   The left ventricular systolic function is normal.   LV end diastolic pressure is mildly elevated.   The left ventricular ejection fraction is greater than 65% by visual estimate.   Hemodynamic findings consistent with severe pulmonary hypertension.   There is no aortic valve stenosis.   Aortic saturation 96% on 3 L nasal cannula, PA saturation 69% on 3 L, mean RA pressure 32 mm Hg; PA pressure 107/48, mean PA pressure 69 mmHg, pulmonary capillary wedge pressure difficult to assess due to significant V waves, and difficulty wedging.  Cardiac output 7.03 L/min, cardiac index 3.07.   Patent stents with mild restenosis in the LAD and circumflex.  Small obtuse marginal appears occluded distally with some right to left collaterals.  No target for PCI.  RHC 09/28/22: RA 32 RV 95/32 PA 93/44, mean 66 PCWP mean 19  Oxygen saturations: PA 49% AO 96%  Cardiac Output (Fick) 3.98  Cardiac Index (Fick) 1.73 PVR 11.8 PAPi 1.5   10/31/22:  HEMODYNAMICS: RA:                  10 mmHg (mean) RV:  104/3-10 mmHg PA:                  100/27 mmHg (57 mean) PCWP:            9 mmHg (mean)                                      Estimated Fick CO/CI   6.7 L/min, 3.15 L/min/m2                                             TPG                 48  mmHg                                            PVR                 7 Wood Units  PAPi                10.4    PFTS: 03/21/22: 03/2022 moderate restriction, ratio 84, FEV1 65%, FVC 59%, TLC 74%, DLCO 13.6/64%.  ERV 8% with wt 275   12/22/22: :  Pt ambulated 758ft (228.35m) O2 Sat ranged 100-95% on 4L oxygen HR ranged 71-88  ASSESSMENT & PLAN:  Pulmonary hypertension, Group I + III - Initial RHC waveforms reviewed personally. RA 30, RV 107/20-30, PA 107/48 (69), unable to wedge; LVEDP 15. AO 140/64. LVEDP ~16. Assuming a PCWP of 20, TPG would be 49 w/ PVR of 7.  - Hemodynamics consistent with severe pulmonary hypertension largely due to pre-capillary PH.  -Cardiac MRI in 5/24 with EF 75%, moderate RV dilation with RV EF 50%, moderate TR, mid-wall basal septal/basal inferolateral LGE. Could be seen with cardiac sarcoidosis (vs prior myocarditis). CT chest showed RUL nodule (negative on PET) and mediastinal LAN. V/Q scan not suggestive of chronic PEs. RHC 5/16 showed severe PAH with low CI 1.73, PAPi 1.5, RA pressure 32 (R>>L heart failure). TEE this admission with LV EF 60-65%, D-shaped septum, moderate RV dilation with moderate RV dysfunction, mod-severe TR. Likely end-stage PH/cor pulmonale d/t OHS/OSA and group 1 PAH.  - Labs: ANA, HCV negative. HIV pending. LFTs mostly unremarkable.  - PFTs w/ moderate restriction.  - BNP previously elevated.  - CT chest w/ pulmonary nodules, however, follow up PET mostly unremarkable.  - Currently on sildenafil 40mg  TID, opsumit & uptravi.  - Participating in pulmonary rehab now; started on anoro also.   2. OSA - followed by pulmonology.  - Now using CPAP nightly - Followed by Dr. Vassie Loll; reviewed notes from 01/19/23  3. CAD - LHC as above - followed by general cardiology.  - no chest pain; stable symptoms.   4. Severe obesity - BMI 48.54 today - discussed importance of weight loss.  - Started on  Mounjoro; tolerating it well   Mikenzie Mccannon Advanced Heart Failure Mechanical Circulatory Support

## 2023-02-26 ENCOUNTER — Encounter (HOSPITAL_COMMUNITY): Payer: Self-pay | Admitting: Cardiology

## 2023-02-26 ENCOUNTER — Ambulatory Visit (HOSPITAL_COMMUNITY)
Admission: RE | Admit: 2023-02-26 | Discharge: 2023-02-26 | Disposition: A | Discharge status: Home / Self Care | Payer: Medicare Other | Source: Ambulatory Visit | Attending: Cardiology | Admitting: Cardiology

## 2023-02-26 VITALS — BP 142/62 | HR 62 | Wt 214.0 lb

## 2023-02-26 DIAGNOSIS — Z955 Presence of coronary angioplasty implant and graft: Secondary | ICD-10-CM | POA: Insufficient documentation

## 2023-02-26 DIAGNOSIS — G4733 Obstructive sleep apnea (adult) (pediatric): Secondary | ICD-10-CM | POA: Diagnosis not present

## 2023-02-26 DIAGNOSIS — I2721 Secondary pulmonary arterial hypertension: Secondary | ICD-10-CM | POA: Insufficient documentation

## 2023-02-26 DIAGNOSIS — E119 Type 2 diabetes mellitus without complications: Secondary | ICD-10-CM | POA: Insufficient documentation

## 2023-02-26 DIAGNOSIS — I2510 Atherosclerotic heart disease of native coronary artery without angina pectoris: Secondary | ICD-10-CM | POA: Insufficient documentation

## 2023-02-26 DIAGNOSIS — E1169 Type 2 diabetes mellitus with other specified complication: Secondary | ICD-10-CM | POA: Diagnosis not present

## 2023-02-26 DIAGNOSIS — Z853 Personal history of malignant neoplasm of breast: Secondary | ICD-10-CM | POA: Insufficient documentation

## 2023-02-26 DIAGNOSIS — E785 Hyperlipidemia, unspecified: Secondary | ICD-10-CM | POA: Insufficient documentation

## 2023-02-26 DIAGNOSIS — Z6834 Body mass index (BMI) 34.0-34.9, adult: Secondary | ICD-10-CM | POA: Diagnosis not present

## 2023-02-26 DIAGNOSIS — R600 Localized edema: Secondary | ICD-10-CM | POA: Diagnosis not present

## 2023-02-26 DIAGNOSIS — I110 Hypertensive heart disease with heart failure: Secondary | ICD-10-CM | POA: Diagnosis not present

## 2023-02-26 DIAGNOSIS — I272 Pulmonary hypertension, unspecified: Secondary | ICD-10-CM | POA: Diagnosis not present

## 2023-02-26 DIAGNOSIS — E669 Obesity, unspecified: Secondary | ICD-10-CM

## 2023-02-26 DIAGNOSIS — I252 Old myocardial infarction: Secondary | ICD-10-CM | POA: Insufficient documentation

## 2023-02-26 LAB — BRAIN NATRIURETIC PEPTIDE: B Natriuretic Peptide: 49.3 pg/mL (ref 0.0–100.0)

## 2023-02-26 NOTE — Patient Instructions (Signed)
Medication Changes:  No Changes In Medications at this time.   Lab Work:  Labs done today, your results will be available in MyChart, we will contact you for abnormal readings.  Follow-Up in: 2 MONTHS AS SCHEDULED WITH DR. Gasper Lloyd   At the [facility], you and your health needs are our priority. We have a designated team specialized in the treatment of Heart Failure. This Care Team includes your primary Heart Failure Specialized Cardiologist (physician), Advanced Practice Providers (APPs- Physician Assistants and Nurse Practitioners), and Pharmacist who all work together to provide you with the care you need, when you need it.   You may see any of the following providers on your designated Care Team at your next follow up:  Dr. Arvilla Meres Dr. Marca Ancona Dr. Dorthula Nettles Dr. Theresia Bough Tonye Becket, NP Robbie Lis, [place] [facility] [place], [place] Brynda Peon, NP [place] Lee, NP Karle Plumber, PharmD   Please be sure to bring in all your medications bottles to every appointment.   Need to Contact us:  If you have any questions or concerns before your next appointment please send [place] a message through [place] or call our office at [phone].    TO LEAVE A MESSAGE FOR THE NURSE SELECT OPTION 2, PLEASE LEAVE A MESSAGE INCLUDING: YOUR NAME DATE OF BIRTH CALL BACK NUMBER REASON FOR CALL**this is important as we prioritize the call backs  YOU WILL RECEIVE A CALL BACK THE SAME DAY AS LONG AS YOU CALL BEFORE 4:00 PM

## 2023-02-27 ENCOUNTER — Encounter (HOSPITAL_COMMUNITY): Payer: Medicare Other

## 2023-03-01 ENCOUNTER — Telehealth: Payer: Self-pay | Admitting: Pulmonary Disease

## 2023-03-01 ENCOUNTER — Encounter (HOSPITAL_COMMUNITY): Payer: Medicare Other

## 2023-03-01 DIAGNOSIS — Z170 Estrogen receptor positive status [ER+]: Secondary | ICD-10-CM | POA: Diagnosis not present

## 2023-03-01 DIAGNOSIS — C50411 Malignant neoplasm of upper-outer quadrant of right female breast: Secondary | ICD-10-CM | POA: Diagnosis not present

## 2023-03-01 DIAGNOSIS — I700 Atherosclerosis of aorta: Secondary | ICD-10-CM | POA: Diagnosis not present

## 2023-03-01 DIAGNOSIS — R918 Other nonspecific abnormal finding of lung field: Secondary | ICD-10-CM | POA: Diagnosis not present

## 2023-03-01 DIAGNOSIS — R911 Solitary pulmonary nodule: Secondary | ICD-10-CM | POA: Diagnosis not present

## 2023-03-01 DIAGNOSIS — J439 Emphysema, unspecified: Secondary | ICD-10-CM | POA: Diagnosis not present

## 2023-03-01 NOTE — Telephone Encounter (Signed)
Pt calling in concerning oxygen liters and wants to know if she qualify for portable oxygen. [employer] needs that info

## 2023-03-05 NOTE — Telephone Encounter (Signed)
LMTCB  Patient wanted to know if she quailifed for POC: Per last office visit with Dr. Vassie Loll:   She will continue to have 4 L oxygen blended into her CPAP machine. She will need reassessment for POC once she reaches a stable state with a pulmonary hypertension, currently she does not qualify

## 2023-03-06 ENCOUNTER — Encounter (HOSPITAL_COMMUNITY): Payer: Medicare Other | Attending: Cardiology

## 2023-03-06 DIAGNOSIS — I272 Pulmonary hypertension, unspecified: Secondary | ICD-10-CM | POA: Insufficient documentation

## 2023-03-08 ENCOUNTER — Encounter (HOSPITAL_COMMUNITY): Payer: Medicare Other

## 2023-03-09 ENCOUNTER — Encounter (HOSPITAL_COMMUNITY): Payer: Self-pay | Admitting: *Deleted

## 2023-03-09 DIAGNOSIS — I272 Pulmonary hypertension, unspecified: Secondary | ICD-10-CM

## 2023-03-12 ENCOUNTER — Other Ambulatory Visit (HOSPITAL_COMMUNITY): Payer: Self-pay | Admitting: Cardiology

## 2023-03-13 ENCOUNTER — Encounter (HOSPITAL_COMMUNITY): Payer: Medicare Other

## 2023-03-15 ENCOUNTER — Encounter (HOSPITAL_COMMUNITY): Payer: Medicare Other

## 2023-03-17 ENCOUNTER — Other Ambulatory Visit: Payer: Self-pay | Admitting: Cardiology

## 2023-03-17 DIAGNOSIS — E1165 Type 2 diabetes mellitus with hyperglycemia: Secondary | ICD-10-CM

## 2023-03-20 ENCOUNTER — Encounter (HOSPITAL_COMMUNITY): Payer: Medicare Other

## 2023-03-21 ENCOUNTER — Encounter (HOSPITAL_COMMUNITY): Payer: Self-pay | Admitting: *Deleted

## 2023-03-21 DIAGNOSIS — I272 Pulmonary hypertension, unspecified: Secondary | ICD-10-CM

## 2023-03-21 NOTE — Progress Notes (Signed)
Pulmonary Individual Treatment Plan  Patient Details  Name: Sandra Schneider MRN: [id number] Date of Birth: [birth year] Referring Provider:   Flowsheet Row PULMONARY REHAB OTHER RESP ORIENTATION from 01/17/2023 in [facility] CARDIAC REHABILITATION  Referring Provider Dr. Gasper Lloyd       Initial Encounter Date:  Flowsheet Row PULMONARY REHAB OTHER RESP ORIENTATION from 01/17/2023 in [facility] CARDIAC REHABILITATION  Date 01/17/23       Visit Diagnosis: Pulmonary hypertension (HCC)  Patient's Home Medications on Admission:   Current Outpatient Medications:    acetaminophen (TYLENOL) 325 MG tablet, Take 650 mg by mouth every 6 (six) hours as needed for moderate pain., Disp: , Rfl:    amiodarone (PACERONE) 200 MG tablet, Take 1 tablet (200 mg total) by mouth daily., Disp: 30 tablet, Rfl: 5   amLODipine (NORVASC) 5 MG tablet, Take 1 tablet (5 mg total) by mouth daily., Disp: 90 tablet, Rfl: 3   apixaban (ELIQUIS) 5 MG TABS tablet, Take 1 tablet (5 mg total) by mouth 2 (two) times daily., Disp: 60 tablet, Rfl: 5   Ascorbic Acid (VITAMIN C PO), Take 1 tablet by mouth daily., Disp: , Rfl:    atorvastatin (LIPITOR) 40 MG tablet, Take 1 tablet by mouth once daily, Disp: 90 tablet, Rfl: 2   Blood Pressure Monitor DEVI, 1 each by Does not apply route daily., Disp: 1 each, Rfl: 0   Carboxymethylcellul-Glycerin (REFRESH OPTIVE) 1-0.9 % GEL, Place 1 drop into both eyes as needed (dry eyes)., Disp: , Rfl:    cetirizine (ZYRTEC) 10 MG tablet, Take 10 mg by mouth daily., Disp: , Rfl:    Cholecalciferol (VITAMIN D) 50 MCG (2000 UT) tablet, Take 2,000 Units by mouth daily., Disp: , Rfl:    famotidine (PEPCID) 20 MG tablet, Take 20 mg by mouth daily., Disp: , Rfl:    letrozole (FEMARA) 2.5 MG tablet, Take 2.5 mg by mouth daily., Disp: , Rfl:    macitentan (OPSUMIT) 10 MG tablet, Take 10 mg by mouth daily., Disp: , Rfl:    MOUNJARO 10 MG/0.5ML Pen, INJECT 10 MG UNDER THE SKIN ONCE A WEEK, Disp: 2 mL,  Rfl: 0   Multiple Vitamin (MULTIVITAMIN) tablet, Take 1 tablet by mouth daily., Disp: , Rfl:    OXYGEN, Inhale 4 L into the lungs continuous., Disp: , Rfl:    Polyethyl Glycol-Propyl Glycol (SYSTANE) 0.4-0.3 % SOLN, Place 1 drop into both eyes as needed (dry eyes)., Disp: , Rfl:    potassium chloride (KLOR-CON M) 10 MEQ tablet, Take 2 tablets (20 mEq total) by mouth daily., Disp: 60 tablet, Rfl: 11   prednisoLONE acetate (PRED FORTE) 1 % ophthalmic suspension, Place 1 drop into both eyes 4 (four) times daily., Disp: , Rfl:    Selexipag (UPTRAVI TITRATION) 200 & 800 MCG TBPK, Take 200 mcg twice daily. Increase as directed up to 1600 mcg twice daily, Disp: , Rfl:    sildenafil (REVATIO) 20 MG tablet, Take 2 tablets (40 mg total) by mouth 3 (three) times daily., Disp: 180 tablet, Rfl: 5   torsemide (DEMADEX) 20 MG tablet, TAKE 2 TABLETS BY MOUTH TWICE DAILY **DISCONTINUE  LASIX**, Disp: 360 tablet, Rfl: 0  Past Medical History: Past Medical History:  Diagnosis Date   Breast cancer (HCC)    Coronary artery disease    Diabetes mellitus    GERD (gastroesophageal reflux disease)    Hypertension    Myocardial abscess    Myocardial infarct (HCC) 06/24/2012   Pulmonary hypertension (HCC)  Tobacco Use: Social History   Tobacco Use  Smoking Status Never   Passive exposure: Never  Smokeless Tobacco Never    Labs: Review Flowsheet  More data exists      Latest Ref Rng & Units 10/05/2022 10/06/2022 10/07/2022 10/31/2022 01/05/2023  Labs for ITP Cardiac and Pulmonary Rehab  Bicarbonate 20.0 - 28.0 mmol/L - - - 28.4  29.2  28.6  27.8   TCO2 22 - 32 mmol/L - - - 30  31  30  29    O2 Saturation % 72.4  65.2  73  59  57  73  72     Details       Multiple values from one day are sorted in reverse-chronological order         Capillary Blood Glucose: Lab Results  Component Value Date   GLUCAP 87 01/25/2023   GLUCAP 95 01/25/2023   GLUCAP 104 (H) 01/23/2023   GLUCAP 106 (H) 01/23/2023    GLUCAP 99 01/23/2023     Pulmonary Assessment Scores:  Pulmonary Assessment Scores     Row Name 01/17/23 0926         ADL UCSD   ADL Phase Entry     SOB Score total 13     Rest 0     Walk 1     Stairs 3     Bath 0     Dress 0     Shop 1       CAT Score   CAT Score 7       mMRC Score   mMRC Score 1             UCSD: Self-administered rating of dyspnea associated with activities of daily living (ADLs) 6-point scale (0 = "not at all" to 5 = "maximal or unable to do because of breathlessness")  Scoring Scores range from 0 to 120.  Minimally important difference is 5 units  CAT: CAT can identify the health impairment of COPD patients and is better correlated with disease progression.  CAT has a scoring range of zero to 40. The CAT score is classified into four groups of low (less than 10), medium (10 - 20), high (21-30) and very high (31-40) based on the impact level of disease on health status. A CAT score over 10 suggests significant symptoms.  A worsening CAT score could be explained by an exacerbation, poor medication adherence, poor inhaler technique, or progression of COPD or comorbid conditions.  CAT MCID is 2 points  mMRC: mMRC (Modified Medical Research Council) Dyspnea Scale is used to assess the degree of baseline functional disability in patients of respiratory disease due to dyspnea. No minimal important difference is established. A decrease in score of 1 point or greater is considered a positive change.   Pulmonary Function Assessment:   Exercise Target Goals: Exercise Program Goal: Individual exercise prescription set using results from initial 6 min walk test and THRR while considering  patient's activity barriers and safety.   Exercise Prescription Goal: Initial exercise prescription builds to 30-45 minutes a day of aerobic activity, 2-3 days per week.  Home exercise guidelines will be given to patient during program as part of exercise  prescription that the participant will acknowledge.  Activity Barriers & Risk Stratification:  Activity Barriers & Cardiac Risk Stratification - 01/09/23 1318       Activity Barriers & Cardiac Risk Stratification   Activity Barriers Other (comment);Deconditioning;Muscular Weakness;Shortness of Breath;Balance Concerns;Assistive Device    Comments gout  flare up             6 Minute Walk:  6 Minute Walk     Row Name 01/17/23 0852         6 Minute Walk   Phase Initial     Distance 850 feet     Walk Time 6 minutes     # of Rest Breaks 0     MPH 1.6     METS 1.61     RPE 12     Perceived Dyspnea  0     VO2 Peak 5.64     Symptoms Yes (comment)     Comments pt stated that RLE was developing gout so it was harder to walk     Resting HR 62 bpm     Resting BP 100/50     Resting Oxygen Saturation  100 %     Exercise Oxygen Saturation  during 6 min walk 100 %     Max Ex. HR 78 bpm     Max Ex. BP 140/52     2 Minute Post BP 130/52       Interval HR   1 Minute HR 64     2 Minute HR 75     3 Minute HR 77     4 Minute HR 78     5 Minute HR 76     6 Minute HR 78     2 Minute Post HR 66     Interval Heart Rate? Yes       Interval Oxygen   Interval Oxygen? Yes     Baseline Oxygen Saturation % 100 %     1 Minute Oxygen Saturation % 100 %     1 Minute Liters of Oxygen 4 L     2 Minute Oxygen Saturation % 100 %     2 Minute Liters of Oxygen 4 L     3 Minute Oxygen Saturation % 100 %     3 Minute Liters of Oxygen 4 L     4 Minute Oxygen Saturation % 100 %     4 Minute Liters of Oxygen 4 L     5 Minute Oxygen Saturation % 100 %     5 Minute Liters of Oxygen 4 L     6 Minute Oxygen Saturation % 100 %     6 Minute Liters of Oxygen 4 L     2 Minute Post Oxygen Saturation % 100 %     2 Minute Post Liters of Oxygen 4 L              Oxygen Initial Assessment:  Oxygen Initial Assessment - 01/17/23 0857       Home Oxygen   Home Oxygen Device E-Tanks;Home Concentrator       Initial 6 min Walk   Oxygen Used Continuous    Liters per minute 4             Oxygen Re-Evaluation:   Oxygen Discharge (Final Oxygen Re-Evaluation):   Initial Exercise Prescription:  Initial Exercise Prescription - 01/17/23 0800       Date of Initial Exercise RX and Referring Provider   Date 01/17/23    Referring Provider Dr. Gasper Lloyd      Oxygen   Oxygen Continuous    Liters 4    Maintain Oxygen Saturation 88% or higher      NuStep   Level 2    SPM 80  Minutes 15    METs 1.9      Track   Laps 15    Minutes 15      Prescription Details   Frequency (times per week) 2    Duration Progress to 30 minutes of continuous aerobic without signs/symptoms of physical distress      Intensity   THRR 40-80% of Max Heartrate 98-134    Ratings of Perceived Exertion 11-13    Perceived Dyspnea 0-4      Resistance Training   Training Prescription Yes    Weight 3    Reps 10-15             Perform Capillary Blood Glucose checks as needed.  Exercise Prescription Changes:   Exercise Comments:   Exercise Goals and Review:   Exercise Goals     Row Name 01/17/23 0855             Exercise Goals   Increase Physical Activity Yes       Intervention Provide advice, education, support and counseling about physical activity/exercise needs.;Develop an individualized exercise prescription for aerobic and resistive training based on initial evaluation findings, risk stratification, comorbidities and participant's personal goals.       Expected Outcomes Short Term: Attend rehab on a regular basis to increase amount of physical activity.;Long Term: Add in home exercise to make exercise part of routine and to increase amount of physical activity.;Long Term: Exercising regularly at least 3-5 days a week.       Increase Strength and Stamina Yes       Intervention Provide advice, education, support and counseling about physical activity/exercise needs.;Develop an  individualized exercise prescription for aerobic and resistive training based on initial evaluation findings, risk stratification, comorbidities and participant's personal goals.       Expected Outcomes Short Term: Increase workloads from initial exercise prescription for resistance, speed, and METs.;Long Term: Improve cardiorespiratory fitness, muscular endurance and strength as measured by increased METs and functional capacity ( );Short Term: Perform resistance training exercises routinely during rehab and add in resistance training at home       Able to understand and use rate of perceived exertion (RPE) scale Yes       Intervention Provide education and explanation on how to use RPE scale       Expected Outcomes Short Term: Able to use RPE daily in rehab to express subjective intensity level;Long Term:  Able to use RPE to guide intensity level when exercising independently       Able to understand and use Dyspnea scale Yes       Intervention Provide education and explanation on how to use Dyspnea scale       Expected Outcomes Short Term: Able to use Dyspnea scale daily in rehab to express subjective sense of shortness of breath during exertion;Long Term: Able to use Dyspnea scale to guide intensity level when exercising independently       Knowledge and understanding of Target Heart Rate Range (THRR) Yes       Intervention Provide education and explanation of THRR including how the numbers were predicted and where they are located for reference       Expected Outcomes Short Term: Able to state/look up THRR;Long Term: Able to use THRR to govern intensity when exercising independently;Short Term: Able to use daily as guideline for intensity in rehab       Understanding of Exercise Prescription Yes       Intervention Provide education, explanation, and  written materials on patient's individual exercise prescription       Expected Outcomes Short Term: Able to explain program exercise  prescription;Long Term: Able to explain home exercise prescription to exercise independently                Exercise Goals Re-Evaluation :   Discharge Exercise Prescription (Final Exercise Prescription Changes):   Nutrition:  Target Goals: Understanding of nutrition guidelines, daily intake of sodium 1500mg , cholesterol 200mg , calories 30% from fat and 7% or less from saturated fats, daily to have 5 or more servings of fruits and vegetables.  Biometrics:  Pre Biometrics - 01/17/23 0856       Pre Biometrics   Height 5\' 6"  (1.676 m)    Weight 218 lb 4.1 oz (99 kg)    Waist Circumference 46 inches    Hip Circumference 48 inches    Waist to Hip Ratio 0.96 %    BMI (Calculated) 35.24    Grip Strength 22.5 kg    Flexibility 0 in    Single Leg Stand 0 seconds              Nutrition Therapy Plan and Nutrition Goals:  Nutrition Therapy & Goals - 01/09/23 1327       Intervention Plan   Intervention Prescribe, educate and counsel regarding individualized specific dietary modifications aiming towards targeted core components such as weight, hypertension, lipid management, diabetes, heart failure and other comorbidities.    Expected Outcomes Short Term Goal: Understand basic principles of dietary content, such as calories, fat, sodium, cholesterol and nutrients.             Nutrition Assessments:  MEDIFICTS Score Key: >=70 Need to make dietary changes  40-70 Heart Healthy Diet <= 40 Therapeutic Level Cholesterol Diet  Flowsheet Row PULMONARY REHAB OTHER RESP ORIENTATION from 01/17/2023 in [facility] CARDIAC REHABILITATION  Picture Your Plate Total Score on Admission 51      Picture Your Plate Scores: <16 Unhealthy dietary pattern with much room for improvement. 41-50 Dietary pattern unlikely to meet recommendations for good health and room for improvement. 51-60 More healthful dietary pattern, with some room for improvement.  >60 Healthy dietary pattern,  although there may be some specific behaviors that could be improved.    Nutrition Goals Re-Evaluation:   Nutrition Goals Discharge (Final Nutrition Goals Re-Evaluation):   Psychosocial: Target Goals: Acknowledge presence or absence of significant depression and/or stress, maximize coping skills, provide positive support system. Participant is able to verbalize types and ability to use techniques and skills needed for reducing stress and depression.  Initial Review & Psychosocial Screening:  Initial Psych Review & Screening - 01/09/23 1322       Initial Review   Current issues with Current Stress Concerns    Source of Stress Concerns Chronic Illness    Comments on oxygen      Family Dynamics   Good Support System? Yes   sister, oldest son, church family     Barriers   Psychosocial barriers to participate in program There are no identifiable barriers or psychosocial needs.      Screening Interventions   Interventions Encouraged to exercise;To provide support and resources with identified psychosocial needs;Provide feedback about the scores to participant    Expected Outcomes Short Term goal: Utilizing psychosocial counselor, staff and physician to assist with identification of specific Stressors or current issues interfering with healing process. Setting desired goal for each stressor or current issue identified.;Short Term goal: Identification and review  with participant of any Quality of Life or Depression concerns found by scoring the questionnaire.;Long Term Goal: Stressors or current issues are controlled or eliminated.;Long Term goal: The participant improves quality of Life and PHQ9 Scores as seen by post scores and/or verbalization of changes             Quality of Life Scores:  Scores of 19 and below usually indicate a poorer quality of life in these areas.  A difference of  2-3 points is a clinically meaningful difference.  A difference of 2-3 points in the total score  of the Quality of Life Index has been associated with significant improvement in overall quality of life, self-image, physical symptoms, and general health in studies assessing change in quality of life.   PHQ-9: Review Flowsheet       01/17/2023 08/13/2012  Depression screen PHQ 2/9  Decreased Interest 3 0  Down, Depressed, Hopeless 0 0  PHQ - 2 Score 3 0  Altered sleeping 0 -  Tired, decreased energy 0 -  Change in appetite 1 -  Feeling bad or failure about yourself  0 -  Trouble concentrating 0 -  Suicidal thoughts 0 -  PHQ-9 Score 4 -  Difficult doing work/chores Not difficult at all -    Details           Interpretation of Total Score  Total Score Depression Severity:  1-4 = Minimal depression, 5-9 = Mild depression, 10-14 = Moderate depression, 15-19 = Moderately severe depression, 20-27 = Severe depression   Psychosocial Evaluation and Intervention:  Psychosocial Evaluation - 01/09/23 1324       Psychosocial Evaluation & Interventions   Interventions Encouraged to exercise with the program and follow exercise prescription    Comments Swayzee is coming into Pulmonary Rehab for Pulmonary Hypertension.  She is eager to feel better. She is motivated and wants to get her heart and lungs healthy and keep the fluid off.  She has a good support system and lives with her sister and her oldest son.  Her biggest barrier is that she lives in [place] and will have to drive from there.  She feels confident that she can do it.  She enjoys going to church and watching TV and movies.  She denies any major stressors other than her personal health and dealing with her oxygen needs.  She has an appt with oxygen company coming up to try to get set up with a portable concentrator.    Expected Outcomes short: Attend rehab to improve heart function Long: Exercise for stamina and mental boost.    Continue Psychosocial Services  Follow up required by staff             Psychosocial  Re-Evaluation:   Psychosocial Discharge (Final Psychosocial Re-Evaluation):    Education: Education Goals: Education classes will be provided on a weekly basis, covering required topics. Participant will state understanding/return demonstration of topics presented.  Learning Barriers/Preferences:  Learning Barriers/Preferences - 01/09/23 1319       Learning Barriers/Preferences   Learning Barriers Sight   glasses for reading   Learning Preferences Written Material;Skilled Demonstration             Education Topics: How Lungs Work and Diseases: - Discuss the anatomy of the lungs and diseases that can affect the lungs, such as COPD.   Exercise: -Discuss the importance of exercise, FITT principles of exercise, normal and abnormal responses to exercise, and how to exercise safely.   Environmental  Irritants: -Discuss types of environmental irritants and how to limit exposure to environmental irritants.   Meds/Inhalers and oxygen: - Discuss respiratory medications, definition of an inhaler and oxygen, and the proper way to use an inhaler and oxygen.   Energy Saving Techniques: - Discuss methods to conserve energy and decrease shortness of breath when performing activities of daily living.    Bronchial Hygiene / Breathing Techniques: - Discuss breathing mechanics, pursed-lip breathing technique,  proper posture, effective ways to clear airways, and other functional breathing techniques   Cleaning Equipment: - Provides group verbal and written instruction about the health risks of elevated stress, cause of high stress, and healthy ways to reduce stress.   Nutrition I: Fats: - Discuss the types of cholesterol, what cholesterol does to the body, and how cholesterol levels can be controlled.   Nutrition II: Labels: -Discuss the different components of food labels and how to read food labels.   Respiratory Infections: - Discuss the signs and symptoms of respiratory  infections, ways to prevent respiratory infections, and the importance of seeking medical treatment when having a respiratory infection.   Stress I: Signs and Symptoms: - Discuss the causes of stress, how stress may lead to anxiety and depression, and ways to limit stress.   Stress II: Relaxation: -Discuss relaxation techniques to limit stress.   Oxygen for Home/Travel: - Discuss how to prepare for travel when on oxygen and proper ways to transport and store oxygen to ensure safety.   Knowledge Questionnaire Score:  Knowledge Questionnaire Score - 01/17/23 0925       Knowledge Questionnaire Score   Pre Score 17/18             Core Components/Risk Factors/Patient Goals at Admission:  Personal Goals and Risk Factors at Admission - 01/09/23 1327       Core Components/Risk Factors/Patient Goals on Admission    Weight Management Yes;Obesity;Weight Loss    Intervention Weight Management: Develop a combined nutrition and exercise program designed to reach desired caloric intake, while maintaining appropriate intake of nutrient and fiber, sodium and fats, and appropriate energy expenditure required for the weight goal.;Weight Management: Provide education and appropriate resources to help participant work on and attain dietary goals.;Weight Management/Obesity: Establish reasonable short term and long term weight goals.;Obesity: Provide education and appropriate resources to help participant work on and attain dietary goals.    Expected Outcomes Short Term: Continue to assess and modify interventions until short term weight is achieved;Long Term: Adherence to nutrition and physical activity/exercise program aimed toward attainment of established weight goal;Weight Loss: Understanding of general recommendations for a balanced deficit meal plan, which promotes 1-2 lb weight loss per week and includes a negative energy balance of [phone] kcal/d;Understanding recommendations for meals to  include 15-35% energy as protein, 25-35% energy from fat, 35-60% energy from carbohydrates, less than 200mg  of dietary cholesterol, 20-35 gm of total fiber daily;Understanding of distribution of calorie intake throughout the day with the consumption of 4-5 meals/snacks    Improve shortness of breath with ADL's Yes    Intervention Provide education, individualized exercise plan and daily activity instruction to help decrease symptoms of SOB with activities of daily living.    Expected Outcomes Short Term: Improve cardiorespiratory fitness to achieve a reduction of symptoms when performing ADLs;Long Term: Be able to perform more ADLs without symptoms or delay the onset of symptoms    Increase knowledge of respiratory medications and ability to use respiratory devices properly  Yes    Intervention  Provide education and demonstration as needed of appropriate use of medications, inhalers, and oxygen therapy.    Expected Outcomes Long Term: Maintain appropriate use of medications, inhalers, and oxygen therapy.;Short Term: Achieves understanding of medications use. Understands that oxygen is a medication prescribed by physician. Demonstrates appropriate use of inhaler and oxygen therapy.    Diabetes Yes    Intervention Provide education about signs/symptoms and action to take for hypo/hyperglycemia.;Provide education about proper nutrition, including hydration, and aerobic/resistive exercise prescription along with prescribed medications to achieve blood glucose in normal ranges: Fasting glucose 65-99 mg/dL    Expected Outcomes Short Term: Participant verbalizes understanding of the signs/symptoms and immediate care of hyper/hypoglycemia, proper foot care and importance of medication, aerobic/resistive exercise and nutrition plan for blood glucose control.;Long Term: Attainment of HbA1C < 7%.    Heart Failure Yes    Intervention Provide a combined exercise and nutrition program that is supplemented with  education, support and counseling about heart failure. Directed toward relieving symptoms such as shortness of breath, decreased exercise tolerance, and extremity edema.    Expected Outcomes Improve functional capacity of life;Short term: Attendance in program 2-3 days a week with increased exercise capacity. Reported lower sodium intake. Reported increased fruit and vegetable intake. Reports medication compliance.;Short term: Daily weights obtained and reported for increase. Utilizing diuretic protocols set by physician.;Long term: Adoption of self-care skills and reduction of barriers for early signs and symptoms recognition and intervention leading to self-care maintenance.    Hypertension Yes    Intervention Provide education on lifestyle modifcations including regular physical activity/exercise, weight management, moderate sodium restriction and increased consumption of fresh fruit, vegetables, and low fat dairy, alcohol moderation, and smoking cessation.;Monitor prescription use compliance.    Expected Outcomes Short Term: Continued assessment and intervention until BP is < 140/47mm HG in hypertensive participants. < 130/54mm HG in hypertensive participants with diabetes, heart failure or chronic kidney disease.;Long Term: Maintenance of blood pressure at goal levels.    Lipids Yes    Intervention Provide education and support for participant on nutrition & aerobic/resistive exercise along with prescribed medications to achieve LDL 70mg , HDL >40mg .    Expected Outcomes Short Term: Participant states understanding of desired cholesterol values and is compliant with medications prescribed. Participant is following exercise prescription and nutrition guidelines.;Long Term: Cholesterol controlled with medications as prescribed, with individualized exercise RX and with personalized nutrition plan. Value goals: LDL < 70mg , HDL > 40 mg.             Core Components/Risk Factors/Patient Goals Review:     Core Components/Risk Factors/Patient Goals at Discharge (Final Review):    ITP Comments:  ITP Comments     Row Name 01/09/23 1330 01/23/23 1101 01/24/23 1505 01/30/23 1001 02/21/23 1558   ITP Comments Completed virtual orientation today.  EP evaluation is scheduled for 01/17/23 at 8am.  Documentation for diagnosis can be found in [facility] encounter 12/22/22. First full day of exercise!  Patient was oriented to gym and equipment including functions, settings, policies, and procedures.  Patient's individual exercise prescription and treatment plan were reviewed.  All starting workloads were established based on the results of the 6 minute walk test done at initial orientation visit.  The plan for exercise progression was also introduced and progression will be customized based on patient's performance and goals. 30 day review completed. ITP sent to Dr.Jehanzeb Memon, Medical Director of  Pulmonary Rehab. Continue with ITP unless changes are made by physician.  New to program Pt called  out with tendonitis in her foot.  She is unable to put full weight on her foot.  She has an appointment on Tuesday and hopes to return next Thursday. 30 day review completed. ITP sent to Dr.Jehanzeb Memon, Medical Director of  Pulmonary Rehab. Continue with ITP unless changes are made by physician.  Pt has not attended since 01/25/23.  Unable to assess for goals this round.    Row Name 03/09/23 1532 03/21/23 0833         ITP Comments sent letter 30 day review completed. ITP sent to Dr.Jehanzeb Memon, Medical Director of  Pulmonary Rehab. Continue with ITP unless changes are made by physician.  Has yet to return to rehab.               Comments: 30 day review

## 2023-03-22 ENCOUNTER — Encounter (HOSPITAL_COMMUNITY): Payer: Medicare Other

## 2023-03-23 ENCOUNTER — Encounter (HOSPITAL_COMMUNITY): Payer: Self-pay | Admitting: *Deleted

## 2023-03-23 DIAGNOSIS — I272 Pulmonary hypertension, unspecified: Secondary | ICD-10-CM

## 2023-03-23 NOTE — Progress Notes (Signed)
Pulmonary Individual Treatment Plan  Patient Details  Name: Sandra Schneider MRN: [id number] Date of Birth: Nov 17, 1955 Referring Provider:   Flowsheet Row PULMONARY REHAB OTHER RESP ORIENTATION from 01/17/2023 in [facility] CARDIAC REHABILITATION  Referring Provider Dr. Gasper Lloyd       Initial Encounter Date:  Flowsheet Row PULMONARY REHAB OTHER RESP ORIENTATION from 01/17/2023 in [facility] CARDIAC REHABILITATION  Date 01/17/23       Visit Diagnosis: Pulmonary hypertension (HCC)  Patient's Home Medications on Admission:   Current Outpatient Medications:    acetaminophen (TYLENOL) 325 MG tablet, Take 650 mg by mouth every 6 (six) hours as needed for moderate pain., Disp: , Rfl:    amiodarone (PACERONE) 200 MG tablet, Take 1 tablet (200 mg total) by mouth daily., Disp: 30 tablet, Rfl: 5   amLODipine (NORVASC) 5 MG tablet, Take 1 tablet (5 mg total) by mouth daily., Disp: 90 tablet, Rfl: 3   apixaban (ELIQUIS) 5 MG TABS tablet, Take 1 tablet (5 mg total) by mouth 2 (two) times daily., Disp: 60 tablet, Rfl: 5   Ascorbic Acid (VITAMIN C PO), Take 1 tablet by mouth daily., Disp: , Rfl:    atorvastatin (LIPITOR) 40 MG tablet, Take 1 tablet by mouth once daily, Disp: 90 tablet, Rfl: 2   Blood Pressure Monitor DEVI, 1 each by Does not apply route daily., Disp: 1 each, Rfl: 0   Carboxymethylcellul-Glycerin (REFRESH OPTIVE) 1-0.9 % GEL, Place 1 drop into both eyes as needed (dry eyes)., Disp: , Rfl:    cetirizine (ZYRTEC) 10 MG tablet, Take 10 mg by mouth daily., Disp: , Rfl:    Cholecalciferol (VITAMIN D) 50 MCG (2000 UT) tablet, Take 2,000 Units by mouth daily., Disp: , Rfl:    famotidine (PEPCID) 20 MG tablet, Take 20 mg by mouth daily., Disp: , Rfl:    letrozole (FEMARA) 2.5 MG tablet, Take 2.5 mg by mouth daily., Disp: , Rfl:    macitentan (OPSUMIT) 10 MG tablet, Take 10 mg by mouth daily., Disp: , Rfl:    MOUNJARO 10 MG/0.5ML Pen, INJECT 10 MG UNDER THE SKIN ONCE A WEEK, Disp: 2 mL,  Rfl: 0   Multiple Vitamin (MULTIVITAMIN) tablet, Take 1 tablet by mouth daily., Disp: , Rfl:    OXYGEN, Inhale 4 L into the lungs continuous., Disp: , Rfl:    Polyethyl Glycol-Propyl Glycol (SYSTANE) 0.4-0.3 % SOLN, Place 1 drop into both eyes as needed (dry eyes)., Disp: , Rfl:    potassium chloride (KLOR-CON M) 10 MEQ tablet, Take 2 tablets (20 mEq total) by mouth daily., Disp: 60 tablet, Rfl: 11   prednisoLONE acetate (PRED FORTE) 1 % ophthalmic suspension, Place 1 drop into both eyes 4 (four) times daily., Disp: , Rfl:    Selexipag (UPTRAVI TITRATION) 200 & 800 MCG TBPK, Take 200 mcg twice daily. Increase as directed up to 1600 mcg twice daily, Disp: , Rfl:    sildenafil (REVATIO) 20 MG tablet, Take 2 tablets (40 mg total) by mouth 3 (three) times daily., Disp: 180 tablet, Rfl: 5   torsemide (DEMADEX) 20 MG tablet, TAKE 2 TABLETS BY MOUTH TWICE DAILY **DISCONTINUE  LASIX**, Disp: 360 tablet, Rfl: 0  Past Medical History: Past Medical History:  Diagnosis Date   Breast cancer (HCC)    Coronary artery disease    Diabetes mellitus    GERD (gastroesophageal reflux disease)    Hypertension    Myocardial abscess    Myocardial infarct (HCC) 06/24/2012   Pulmonary hypertension (HCC)  Tobacco Use: Social History   Tobacco Use  Smoking Status Never   Passive exposure: Never  Smokeless Tobacco Never    Labs: Review Flowsheet  More data exists      Latest Ref Rng & Units 10/05/2022 10/06/2022 10/07/2022 10/31/2022 01/05/2023  Labs for ITP Cardiac and Pulmonary Rehab  Bicarbonate 20.0 - 28.0 mmol/L - - - 28.4  29.2  28.6  27.8   TCO2 22 - 32 mmol/L - - - 30  31  30  29    O2 Saturation % 72.4  65.2  73  59  57  73  72     Details       Multiple values from one day are sorted in reverse-chronological order         Capillary Blood Glucose: Lab Results  Component Value Date   GLUCAP 87 01/25/2023   GLUCAP 95 01/25/2023   GLUCAP 104 (H) 01/23/2023   GLUCAP 106 (H) 01/23/2023    GLUCAP 99 01/23/2023     Pulmonary Assessment Scores:  Pulmonary Assessment Scores     Row Name 01/17/23 0926         ADL UCSD   ADL Phase Entry     SOB Score total 13     Rest 0     Walk 1     Stairs 3     Bath 0     Dress 0     Shop 1       CAT Score   CAT Score 7       mMRC Score   mMRC Score 1             UCSD: Self-administered rating of dyspnea associated with activities of daily living (ADLs) 6-point scale (0 = "not at all" to 5 = "maximal or unable to do because of breathlessness")  Scoring Scores range from 0 to 120.  Minimally important difference is 5 units  CAT: CAT can identify the health impairment of COPD patients and is better correlated with disease progression.  CAT has a scoring range of zero to 40. The CAT score is classified into four groups of low (less than 10), medium (10 - 20), high (21-30) and very high (31-40) based on the impact level of disease on health status. A CAT score over 10 suggests significant symptoms.  A worsening CAT score could be explained by an exacerbation, poor medication adherence, poor inhaler technique, or progression of COPD or comorbid conditions.  CAT MCID is 2 points  mMRC: mMRC (Modified Medical Research Council) Dyspnea Scale is used to assess the degree of baseline functional disability in patients of respiratory disease due to dyspnea. No minimal important difference is established. A decrease in score of 1 point or greater is considered a positive change.   Pulmonary Function Assessment:   Exercise Target Goals: Exercise Program Goal: Individual exercise prescription set using results from initial 6 min walk test and THRR while considering  patient's activity barriers and safety.   Exercise Prescription Goal: Initial exercise prescription builds to 30-45 minutes a day of aerobic activity, 2-3 days per week.  Home exercise guidelines will be given to patient during program as part of exercise  prescription that the participant will acknowledge.  Activity Barriers & Risk Stratification:  Activity Barriers & Cardiac Risk Stratification - 01/09/23 1318       Activity Barriers & Cardiac Risk Stratification   Activity Barriers Other (comment);Deconditioning;Muscular Weakness;Shortness of Breath;Balance Concerns;Assistive Device    Comments gout  flare up             6 Minute Walk:  6 Minute Walk     Row Name 01/17/23 0852         6 Minute Walk   Phase Initial     Distance 850 feet     Walk Time 6 minutes     # of Rest Breaks 0     MPH 1.6     METS 1.61     RPE 12     Perceived Dyspnea  0     VO2 Peak 5.64     Symptoms Yes (comment)     Comments pt stated that RLE was developing gout so it was harder to walk     Resting HR 62 bpm     Resting BP 100/50     Resting Oxygen Saturation  100 %     Exercise Oxygen Saturation  during 6 min walk 100 %     Max Ex. HR 78 bpm     Max Ex. BP 140/52     2 Minute Post BP 130/52       Interval HR   1 Minute HR 64     2 Minute HR 75     3 Minute HR 77     4 Minute HR 78     5 Minute HR 76     6 Minute HR 78     2 Minute Post HR 66     Interval Heart Rate? Yes       Interval Oxygen   Interval Oxygen? Yes     Baseline Oxygen Saturation % 100 %     1 Minute Oxygen Saturation % 100 %     1 Minute Liters of Oxygen 4 L     2 Minute Oxygen Saturation % 100 %     2 Minute Liters of Oxygen 4 L     3 Minute Oxygen Saturation % 100 %     3 Minute Liters of Oxygen 4 L     4 Minute Oxygen Saturation % 100 %     4 Minute Liters of Oxygen 4 L     5 Minute Oxygen Saturation % 100 %     5 Minute Liters of Oxygen 4 L     6 Minute Oxygen Saturation % 100 %     6 Minute Liters of Oxygen 4 L     2 Minute Post Oxygen Saturation % 100 %     2 Minute Post Liters of Oxygen 4 L              Oxygen Initial Assessment:  Oxygen Initial Assessment - 01/17/23 0857       Home Oxygen   Home Oxygen Device E-Tanks;Home Concentrator       Initial 6 min Walk   Oxygen Used Continuous    Liters per minute 4             Oxygen Re-Evaluation:   Oxygen Discharge (Final Oxygen Re-Evaluation):   Initial Exercise Prescription:  Initial Exercise Prescription - 01/17/23 0800       Date of Initial Exercise RX and Referring Provider   Date 01/17/23    Referring Provider Dr. Gasper Lloyd      Oxygen   Oxygen Continuous    Liters 4    Maintain Oxygen Saturation 88% or higher      NuStep   Level 2    SPM 80  Minutes 15    METs 1.9      Track   Laps 15    Minutes 15      Prescription Details   Frequency (times per week) 2    Duration Progress to 30 minutes of continuous aerobic without signs/symptoms of physical distress      Intensity   THRR 40-80% of Max Heartrate 98-134    Ratings of Perceived Exertion 11-13    Perceived Dyspnea 0-4      Resistance Training   Training Prescription Yes    Weight 3    Reps 10-15             Perform Capillary Blood Glucose checks as needed.  Exercise Prescription Changes:   Exercise Comments:   Exercise Goals and Review:   Exercise Goals     Row Name 01/17/23 0855             Exercise Goals   Increase Physical Activity Yes       Intervention Provide advice, education, support and counseling about physical activity/exercise needs.;Develop an individualized exercise prescription for aerobic and resistive training based on initial evaluation findings, risk stratification, comorbidities and participant's personal goals.       Expected Outcomes Short Term: Attend rehab on a regular basis to increase amount of physical activity.;Long Term: Add in home exercise to make exercise part of routine and to increase amount of physical activity.;Long Term: Exercising regularly at least 3-5 days a week.       Increase Strength and Stamina Yes       Intervention Provide advice, education, support and counseling about physical activity/exercise needs.;Develop an  individualized exercise prescription for aerobic and resistive training based on initial evaluation findings, risk stratification, comorbidities and participant's personal goals.       Expected Outcomes Short Term: Increase workloads from initial exercise prescription for resistance, speed, and METs.;Long Term: Improve cardiorespiratory fitness, muscular endurance and strength as measured by increased METs and functional capacity ( );Short Term: Perform resistance training exercises routinely during rehab and add in resistance training at home       Able to understand and use rate of perceived exertion (RPE) scale Yes       Intervention Provide education and explanation on how to use RPE scale       Expected Outcomes Short Term: Able to use RPE daily in rehab to express subjective intensity level;Long Term:  Able to use RPE to guide intensity level when exercising independently       Able to understand and use Dyspnea scale Yes       Intervention Provide education and explanation on how to use Dyspnea scale       Expected Outcomes Short Term: Able to use Dyspnea scale daily in rehab to express subjective sense of shortness of breath during exertion;Long Term: Able to use Dyspnea scale to guide intensity level when exercising independently       Knowledge and understanding of Target Heart Rate Range (THRR) Yes       Intervention Provide education and explanation of THRR including how the numbers were predicted and where they are located for reference       Expected Outcomes Short Term: Able to state/look up THRR;Long Term: Able to use THRR to govern intensity when exercising independently;Short Term: Able to use daily as guideline for intensity in rehab       Understanding of Exercise Prescription Yes       Intervention Provide education, explanation, and  written materials on patient's individual exercise prescription       Expected Outcomes Short Term: Able to explain program exercise  prescription;Long Term: Able to explain home exercise prescription to exercise independently                Exercise Goals Re-Evaluation :   Discharge Exercise Prescription (Final Exercise Prescription Changes):   Nutrition:  Target Goals: Understanding of nutrition guidelines, daily intake of sodium 1500mg , cholesterol 200mg , calories 30% from fat and 7% or less from saturated fats, daily to have 5 or more servings of fruits and vegetables.  Biometrics:  Pre Biometrics - 01/17/23 0856       Pre Biometrics   Height 5\' 6"  (1.676 m)    Weight 218 lb 4.1 oz (99 kg)    Waist Circumference 46 inches    Hip Circumference 48 inches    Waist to Hip Ratio 0.96 %    BMI (Calculated) 35.24    Grip Strength 22.5 kg    Flexibility 0 in    Single Leg Stand 0 seconds              Nutrition Therapy Plan and Nutrition Goals:  Nutrition Therapy & Goals - 01/09/23 1327       Intervention Plan   Intervention Prescribe, educate and counsel regarding individualized specific dietary modifications aiming towards targeted core components such as weight, hypertension, lipid management, diabetes, heart failure and other comorbidities.    Expected Outcomes Short Term Goal: Understand basic principles of dietary content, such as calories, fat, sodium, cholesterol and nutrients.             Nutrition Assessments:  MEDIFICTS Score Key: >=70 Need to make dietary changes  40-70 Heart Healthy Diet <= 40 Therapeutic Level Cholesterol Diet  Flowsheet Row PULMONARY REHAB OTHER RESP ORIENTATION from 01/17/2023 in [facility] CARDIAC REHABILITATION  Picture Your Plate Total Score on Admission 51      Picture Your Plate Scores: <78 Unhealthy dietary pattern with much room for improvement. 41-50 Dietary pattern unlikely to meet recommendations for good health and room for improvement. 51-60 More healthful dietary pattern, with some room for improvement.  >60 Healthy dietary pattern,  although there may be some specific behaviors that could be improved.    Nutrition Goals Re-Evaluation:   Nutrition Goals Discharge (Final Nutrition Goals Re-Evaluation):   Psychosocial: Target Goals: Acknowledge presence or absence of significant depression and/or stress, maximize coping skills, provide positive support system. Participant is able to verbalize types and ability to use techniques and skills needed for reducing stress and depression.  Initial Review & Psychosocial Screening:  Initial Psych Review & Screening - 01/09/23 1322       Initial Review   Current issues with Current Stress Concerns    Source of Stress Concerns Chronic Illness    Comments on oxygen      Family Dynamics   Good Support System? Yes   sister, oldest son, church family     Barriers   Psychosocial barriers to participate in program There are no identifiable barriers or psychosocial needs.      Screening Interventions   Interventions Encouraged to exercise;To provide support and resources with identified psychosocial needs;Provide feedback about the scores to participant    Expected Outcomes Short Term goal: Utilizing psychosocial counselor, staff and physician to assist with identification of specific Stressors or current issues interfering with healing process. Setting desired goal for each stressor or current issue identified.;Short Term goal: Identification and review  with participant of any Quality of Life or Depression concerns found by scoring the questionnaire.;Long Term Goal: Stressors or current issues are controlled or eliminated.;Long Term goal: The participant improves quality of Life and PHQ9 Scores as seen by post scores and/or verbalization of changes             Quality of Life Scores:  Scores of 19 and below usually indicate a poorer quality of life in these areas.  A difference of  2-3 points is a clinically meaningful difference.  A difference of 2-3 points in the total score  of the Quality of Life Index has been associated with significant improvement in overall quality of life, self-image, physical symptoms, and general health in studies assessing change in quality of life.   PHQ-9: Review Flowsheet       01/17/2023 08/13/2012  Depression screen PHQ 2/9  Decreased Interest 3 0  Down, Depressed, Hopeless 0 0  PHQ - 2 Score 3 0  Altered sleeping 0 -  Tired, decreased energy 0 -  Change in appetite 1 -  Feeling bad or failure about yourself  0 -  Trouble concentrating 0 -  Suicidal thoughts 0 -  PHQ-9 Score 4 -  Difficult doing work/chores Not difficult at all -    Details           Interpretation of Total Score  Total Score Depression Severity:  1-4 = Minimal depression, 5-9 = Mild depression, 10-14 = Moderate depression, 15-19 = Moderately severe depression, 20-27 = Severe depression   Psychosocial Evaluation and Intervention:  Psychosocial Evaluation - 01/09/23 1324       Psychosocial Evaluation & Interventions   Interventions Encouraged to exercise with the program and follow exercise prescription    Comments Sandra Schneider is coming into Pulmonary Rehab for Pulmonary Hypertension.  She is eager to feel better. She is motivated and wants to get her heart and lungs healthy and keep the fluid off.  She has a good support system and lives with her sister and her oldest son.  Her biggest barrier is that she lives in [place] and will have to drive from there.  She feels confident that she can do it.  She enjoys going to church and watching TV and movies.  She denies any major stressors other than her personal health and dealing with her oxygen needs.  She has an appt with oxygen company coming up to try to get set up with a portable concentrator.    Expected Outcomes short: Attend rehab to improve heart function Long: Exercise for stamina and mental boost.    Continue Psychosocial Services  Follow up required by staff             Psychosocial  Re-Evaluation:   Psychosocial Discharge (Final Psychosocial Re-Evaluation):    Education: Education Goals: Education classes will be provided on a weekly basis, covering required topics. Participant will state understanding/return demonstration of topics presented.  Learning Barriers/Preferences:  Learning Barriers/Preferences - 01/09/23 1319       Learning Barriers/Preferences   Learning Barriers Sight   glasses for reading   Learning Preferences Written Material;Skilled Demonstration             Education Topics: How Lungs Work and Diseases: - Discuss the anatomy of the lungs and diseases that can affect the lungs, such as COPD.   Exercise: -Discuss the importance of exercise, FITT principles of exercise, normal and abnormal responses to exercise, and how to exercise safely.   Environmental  Irritants: -Discuss types of environmental irritants and how to limit exposure to environmental irritants.   Meds/Inhalers and oxygen: - Discuss respiratory medications, definition of an inhaler and oxygen, and the proper way to use an inhaler and oxygen.   Energy Saving Techniques: - Discuss methods to conserve energy and decrease shortness of breath when performing activities of daily living.    Bronchial Hygiene / Breathing Techniques: - Discuss breathing mechanics, pursed-lip breathing technique,  proper posture, effective ways to clear airways, and other functional breathing techniques   Cleaning Equipment: - Provides group verbal and written instruction about the health risks of elevated stress, cause of high stress, and healthy ways to reduce stress.   Nutrition I: Fats: - Discuss the types of cholesterol, what cholesterol does to the body, and how cholesterol levels can be controlled.   Nutrition II: Labels: -Discuss the different components of food labels and how to read food labels.   Respiratory Infections: - Discuss the signs and symptoms of respiratory  infections, ways to prevent respiratory infections, and the importance of seeking medical treatment when having a respiratory infection.   Stress I: Signs and Symptoms: - Discuss the causes of stress, how stress may lead to anxiety and depression, and ways to limit stress.   Stress II: Relaxation: -Discuss relaxation techniques to limit stress.   Oxygen for Home/Travel: - Discuss how to prepare for travel when on oxygen and proper ways to transport and store oxygen to ensure safety.   Knowledge Questionnaire Score:  Knowledge Questionnaire Score - 01/17/23 0925       Knowledge Questionnaire Score   Pre Score 17/18             Core Components/Risk Factors/Patient Goals at Admission:  Personal Goals and Risk Factors at Admission - 01/09/23 1327       Core Components/Risk Factors/Patient Goals on Admission    Weight Management Yes;Obesity;Weight Loss    Intervention Weight Management: Develop a combined nutrition and exercise program designed to reach desired caloric intake, while maintaining appropriate intake of nutrient and fiber, sodium and fats, and appropriate energy expenditure required for the weight goal.;Weight Management: Provide education and appropriate resources to help participant work on and attain dietary goals.;Weight Management/Obesity: Establish reasonable short term and long term weight goals.;Obesity: Provide education and appropriate resources to help participant work on and attain dietary goals.    Expected Outcomes Short Term: Continue to assess and modify interventions until short term weight is achieved;Long Term: Adherence to nutrition and physical activity/exercise program aimed toward attainment of established weight goal;Weight Loss: Understanding of general recommendations for a balanced deficit meal plan, which promotes 1-2 lb weight loss per week and includes a negative energy balance of [phone] kcal/d;Understanding recommendations for meals to  include 15-35% energy as protein, 25-35% energy from fat, 35-60% energy from carbohydrates, less than 200mg  of dietary cholesterol, 20-35 gm of total fiber daily;Understanding of distribution of calorie intake throughout the day with the consumption of 4-5 meals/snacks    Improve shortness of breath with ADL's Yes    Intervention Provide education, individualized exercise plan and daily activity instruction to help decrease symptoms of SOB with activities of daily living.    Expected Outcomes Short Term: Improve cardiorespiratory fitness to achieve a reduction of symptoms when performing ADLs;Long Term: Be able to perform more ADLs without symptoms or delay the onset of symptoms    Increase knowledge of respiratory medications and ability to use respiratory devices properly  Yes    Intervention  Provide education and demonstration as needed of appropriate use of medications, inhalers, and oxygen therapy.    Expected Outcomes Long Term: Maintain appropriate use of medications, inhalers, and oxygen therapy.;Short Term: Achieves understanding of medications use. Understands that oxygen is a medication prescribed by physician. Demonstrates appropriate use of inhaler and oxygen therapy.    Diabetes Yes    Intervention Provide education about signs/symptoms and action to take for hypo/hyperglycemia.;Provide education about proper nutrition, including hydration, and aerobic/resistive exercise prescription along with prescribed medications to achieve blood glucose in normal ranges: Fasting glucose 65-99 mg/dL    Expected Outcomes Short Term: Participant verbalizes understanding of the signs/symptoms and immediate care of hyper/hypoglycemia, proper foot care and importance of medication, aerobic/resistive exercise and nutrition plan for blood glucose control.;Long Term: Attainment of HbA1C < 7%.    Heart Failure Yes    Intervention Provide a combined exercise and nutrition program that is supplemented with  education, support and counseling about heart failure. Directed toward relieving symptoms such as shortness of breath, decreased exercise tolerance, and extremity edema.    Expected Outcomes Improve functional capacity of life;Short term: Attendance in program 2-3 days a week with increased exercise capacity. Reported lower sodium intake. Reported increased fruit and vegetable intake. Reports medication compliance.;Short term: Daily weights obtained and reported for increase. Utilizing diuretic protocols set by physician.;Long term: Adoption of self-care skills and reduction of barriers for early signs and symptoms recognition and intervention leading to self-care maintenance.    Hypertension Yes    Intervention Provide education on lifestyle modifcations including regular physical activity/exercise, weight management, moderate sodium restriction and increased consumption of fresh fruit, vegetables, and low fat dairy, alcohol moderation, and smoking cessation.;Monitor prescription use compliance.    Expected Outcomes Short Term: Continued assessment and intervention until BP is < 140/58mm HG in hypertensive participants. < 130/74mm HG in hypertensive participants with diabetes, heart failure or chronic kidney disease.;Long Term: Maintenance of blood pressure at goal levels.    Lipids Yes    Intervention Provide education and support for participant on nutrition & aerobic/resistive exercise along with prescribed medications to achieve LDL 70mg , HDL >40mg .    Expected Outcomes Short Term: Participant states understanding of desired cholesterol values and is compliant with medications prescribed. Participant is following exercise prescription and nutrition guidelines.;Long Term: Cholesterol controlled with medications as prescribed, with individualized exercise RX and with personalized nutrition plan. Value goals: LDL < 70mg , HDL > 40 mg.             Core Components/Risk Factors/Patient Goals Review:     Core Components/Risk Factors/Patient Goals at Discharge (Final Review):    ITP Comments:  ITP Comments     Row Name 01/09/23 1330 01/23/23 1101 01/24/23 1505 01/30/23 1001 02/21/23 1558   ITP Comments Completed virtual orientation today.  EP evaluation is scheduled for 01/17/23 at 8am.  Documentation for diagnosis can be found in [facility] encounter 12/22/22. First full day of exercise!  Patient was oriented to gym and equipment including functions, settings, policies, and procedures.  Patient's individual exercise prescription and treatment plan were reviewed.  All starting workloads were established based on the results of the 6 minute walk test done at initial orientation visit.  The plan for exercise progression was also introduced and progression will be customized based on patient's performance and goals. 30 day review completed. ITP sent to Dr.Jehanzeb Memon, Medical Director of  Pulmonary Rehab. Continue with ITP unless changes are made by physician.  New to program Pt called  out with tendonitis in her foot.  She is unable to put full weight on her foot.  She has an appointment on Tuesday and hopes to return next Thursday. 30 day review completed. ITP sent to Dr.Jehanzeb Memon, Medical Director of  Pulmonary Rehab. Continue with ITP unless changes are made by physician.  Pt has not attended since 01/25/23.  Unable to assess for goals this round.    Row Name 03/09/23 1532 03/21/23 0833 03/23/23 1132       ITP Comments sent letter 30 day review completed. ITP sent to Dr.Jehanzeb Memon, Medical Director of  Pulmonary Rehab. Continue with ITP unless changes are made by physician.  Has yet to return to rehab. Pt has not attended since 01/25/23. She completed 3 sessions.  She has not returned and has not responded to calls or letter.  We will discharge her at this time.              Comments: Discharge ITP (3 sessions completed)

## 2023-03-27 ENCOUNTER — Encounter (HOSPITAL_COMMUNITY): Payer: Medicare Other

## 2023-03-29 ENCOUNTER — Encounter (HOSPITAL_COMMUNITY): Payer: Medicare Other

## 2023-04-03 ENCOUNTER — Encounter (HOSPITAL_COMMUNITY): Payer: Medicare Other

## 2023-04-05 ENCOUNTER — Telehealth: Payer: Self-pay | Admitting: Cardiology

## 2023-04-05 ENCOUNTER — Encounter (HOSPITAL_COMMUNITY): Payer: Medicare Other

## 2023-04-05 MED ORDER — APIXABAN 5 MG PO TABS: 5.0000 mg | ORAL_TABLET | Freq: Two times a day (BID) | ORAL | 1 refills | Status: DC

## 2023-04-05 NOTE — Telephone Encounter (Signed)
Prescription refill request for Eliquis received. Indication: afib  Last office visit: Sabharwal, 02/26/2023, peck, 12/11/2022 Scr: 1.54, 02/21/2023 Age: 67 yo  Weight: 97.1 kg

## 2023-04-05 NOTE — Telephone Encounter (Signed)
*  STAT* If patient is at the pharmacy, call can be transferred to refill team.   1. Which medications need to be refilled? (please list name of each medication and dose if known) apixaban (ELIQUIS) 5 MG TABS tablet    2. Would you like to learn more about the convenience, safety, & potential cost savings by using the [facility]? No   3. Are you open to using the [facility] (Type [facility]. ) No   4. Which pharmacy/location (including street and city if local pharmacy) is medication to be sent to? [facility] [address]  - [address]    5. Do they need a 30 day or 90 day supply? 90 day   Pt has scheduled appt on 11/25 w/ NP

## 2023-04-09 ENCOUNTER — Encounter: Payer: Self-pay | Admitting: Nurse Practitioner

## 2023-04-09 ENCOUNTER — Other Ambulatory Visit: Payer: Self-pay | Admitting: Cardiology

## 2023-04-09 ENCOUNTER — Encounter (HOSPITAL_COMMUNITY): Payer: Medicare Other | Attending: Nurse Practitioner | Admitting: Nurse Practitioner

## 2023-04-09 VITALS — BP 132/60 | HR 68 | Ht 65.0 in | Wt 204.4 lb

## 2023-04-09 DIAGNOSIS — N183 Chronic kidney disease, stage 3 unspecified: Secondary | ICD-10-CM | POA: Diagnosis not present

## 2023-04-09 DIAGNOSIS — Z87898 Personal history of other specified conditions: Secondary | ICD-10-CM

## 2023-04-09 DIAGNOSIS — I4891 Unspecified atrial fibrillation: Secondary | ICD-10-CM | POA: Diagnosis not present

## 2023-04-09 DIAGNOSIS — I50810 Right heart failure, unspecified: Secondary | ICD-10-CM

## 2023-04-09 DIAGNOSIS — I10 Essential (primary) hypertension: Secondary | ICD-10-CM

## 2023-04-09 DIAGNOSIS — I2510 Atherosclerotic heart disease of native coronary artery without angina pectoris: Secondary | ICD-10-CM

## 2023-04-09 DIAGNOSIS — I2781 Cor pulmonale (chronic): Secondary | ICD-10-CM | POA: Diagnosis not present

## 2023-04-09 DIAGNOSIS — E669 Obesity, unspecified: Secondary | ICD-10-CM

## 2023-04-09 DIAGNOSIS — I272 Pulmonary hypertension, unspecified: Secondary | ICD-10-CM | POA: Diagnosis not present

## 2023-04-09 DIAGNOSIS — E785 Hyperlipidemia, unspecified: Secondary | ICD-10-CM

## 2023-04-09 DIAGNOSIS — E1165 Type 2 diabetes mellitus with hyperglycemia: Secondary | ICD-10-CM

## 2023-04-09 MED ORDER — SILDENAFIL CITRATE 20 MG PO TABS: 40.0000 mg | ORAL_TABLET | Freq: Three times a day (TID) | ORAL | 5 refills | Status: DC

## 2023-04-09 NOTE — Patient Instructions (Addendum)
Medication Instructions:  Your physician recommends that you continue on your current medications as directed. Please refer to the Current Medication list given to you today.  Labwork: None   Testing/Procedures: None   Follow-Up: Your physician recommends that you schedule a follow-up appointment in: 4-6 Months   Any Other Special Instructions Will Be Listed Below (If Applicable).   If you need a refill on your cardiac medications before your next appointment, please call your pharmacy.

## 2023-04-09 NOTE — Progress Notes (Signed)
Cardiology Office Note:  .   Date:  04/09/2023  ID:  Aldona Bar, DOB 1955-07-01, MRN [id number] PCP: Wilmon Pali, FNP  [facility] Providers Cardiologist:  Dina Rich, MD    History of Present Illness: .   Sandra Schneider is a 67 y.o. female with a PMH of CAD, HTN, HLD, OSA (followed by Pulmonology), hx of breast cancer, s/p lumpectomy and SHOB, pulmonary HTN, A-fib, and severe obesity, who presents today for follow-up.   Last seen by Dr. Dina Rich on January 27, 2022. Was set up for [facility] based on severe pulmonary HTN seen on TTE. Had workup for interstitial lung disease by pulmonology with CT chest that was negative.  PET scan negative, however she did have evidence of right upper lobe lung nodule.  Colonoscopy mammogram were negative.  Has also been followed by CHF clinic. See Cardiac MRI results below.  Hospital admission 09/2022 for acute on chronic diastolic CHF, severe RV failure, pulmonary HTN, and persistent A-fib. Underwent RHC on Sep 28, 2022 that revealed markedly elevated right heart filling pressures with low but not markedly low PAPi, mildly elevated PCWP, severe PAH, low CO. Started on Lasix and Milrinone for end-stage PAH/core pulmonale d/t OSA/OHS and Group 1 PAH-diuresed aggressively. Started on sildenafil. 5/18 failed DCCV (TEE) 5/24 DCCV > SR. Underwent repeat RHC on 10/31/2022- see results below.   Last seen by Dr. Gasper Lloyd on November 17, 2022. Torsemide was increased to 60 mg BID with uptravi initiated.   I last saw her for follow-up on December 11, 2022. Was doing well at the time. Her breathing status remained stable, was wearing 4 liters of oxygen continuously. Weight remained stable. Denied any chest pain, worsening shortness of breath, palpitations, syncope, presyncope, dizziness, orthopnea, PND, swelling or significant weight changes, acute bleeding, or claudication.   Since I last saw patient, she has undergone another right heart  catheterization on January 05, 2023.  See results below.  She is doing well today.  Since I last saw her she has lost close to 30 lbs. Denies any chest pain, worsening shortness of breath, palpitations, syncope, presyncope, dizziness, orthopnea, PND, swelling or significant weight changes, acute bleeding, or claudication. Breathing is stable and continues to wear 4 L of oxygen via nasal cannula.  Stated she is waiting to hear back regarding her portable oxygen.   Studies Reviewed: Marland Kitchen     RHC 01/05/2023:  IMPRESSION: Low pre and post capillary filling pressures.  Elevated PVR and PA mean in the setting of normal cardiac output consistent with underlying Group 1 + III PH.    RECOMMENDATIONS: Start Uptravi.   RHC 10/31/2022:  IMPRESSION: Severely elevated pre capillary filling pressures, PA mean and PVR consistent with Group 1 + 3 PH Normal post -capillary filling pressures Preserved cardiac output / cardiac index.  Preserved RV function.  CMRI 09/14/2022: IMPRESSION: 1. Normal LV size with vigorous systolic function, EF 75%. No wall motion abnormalities.   2. Visually, the RV appears moderately dilated but it measures mildly dilated. Low normal systolic function, EF 50%.   3.  Moderate TR with regurgitant fraction 26%.   4. Difficult LGE images, but there appears to be a noncoronary-type mid-wall LGE pattern involving the basal anteroseptal, basal to mid inferoseptal, and basal to mid inferolateral wall segments. This suggests prior myocarditis versus possible cardiac sarcoidosis and merits further workup.   5. Elevated extracellular volume percentage suggesting increased myocardial fibrotic content. Not quite to the level that suggests cardiac  amyloidosis.  Echo complete 07/2022: 1. Left ventricular ejection fraction, by estimation, is 60 to 65%. The  left ventricle has normal function. The left ventricle has no regional  wall motion abnormalities. Left ventricular diastolic  parameters are  consistent with Grade I diastolic  dysfunction (impaired relaxation).   2. Right ventricular systolic function is severely reduced. The right  ventricular size is severely enlarged. There is severely elevated  pulmonary artery systolic pressure.   3. Left atrial size was mildly dilated.   4. Right atrial size was moderately dilated.   5. The mitral valve is abnormal. Trivial mitral valve regurgitation. No  evidence of mitral stenosis. Severe mitral annular calcification.   6. Tricuspid valve regurgitation is severe.   7. The aortic valve is tricuspid. There is mild calcification of the  aortic valve. Aortic valve regurgitation is not visualized. Aortic valve  sclerosis is present, with no evidence of aortic valve stenosis.   8. The inferior vena cava is dilated in size with <50% respiratory  variability, suggesting right atrial pressure of 15 mmHg.   9. Agitated saline contrast bubble study was negative, with no evidence  of any interatrial shunt.  Physical Exam:   VS:  BP 132/60   Pulse 68   Ht 5\' 5"  (1.651 m)   Wt 204 lb 6.4 oz (92.7 kg)   LMP 01/14/2011   SpO2 100% Comment: 4 litters  BMI 34.01 kg/m    Wt Readings from Last 3 Encounters:  04/09/23 204 lb 6.4 oz (92.7 kg)  02/26/23 214 lb (97.1 kg)  01/24/23 220 lb 3.2 oz (99.9 kg)    GEN: Obese, 67 y.o. female in no acute distress NECK: No JVD; No carotid bruits CARDIAC: S1/S2, RRR, no murmurs, rubs, gallops RESPIRATORY:  Clear to auscultation without rales, wheezing or rhonchi  ABDOMEN: Soft, non-tender, non-distended EXTREMITIES:  No edema; No deformity   ASSESSMENT AND PLAN: .    RV Failure, Cor pulmonale, Pulmonary HTN Stage C-D, NYHA class II symptoms. EF 60-65% Euvolemic and well compensated on exam. See RHC report (12/2022) noted above. Has lost close to 30 lbs intentionally. Continue current medication regimen, will refill her Revatio per her request. Low sodium diet, fluid restriction <2L, and  daily weights encouraged. Educated to contact our office for weight gain of 2 lbs overnight or 5 lbs in one week. Continue to follow-up with HF clinic and Pulmonology.  Will reach out to Dr. Reginia Naas office regarding her portable oxygen.  CAD Remote PCI LAD and Lcx. Stable with no anginal symptoms. No indication for ischemic evaluation. Not on Aspirin d/t being on Eliquis. Continue atorvastatin. Heart healthy diet and regular cardiovascular exercise encouraged.   HLD LDL 44 07/2022. Continue atorvastatin. Heart healthy diet encouraged.   HTN BP at goal. Discussed SBP goal < 140. No medication changes at this time. Discussed to monitor BP at home at least 2 hours after medications and sitting for 5-10 minutes. Heart healthy diet encouraged.   A-fib Denies any tachycardia or palpitations. S/P DCCV -> SR. Continue Amiodarone and Eliquis, currently on appropriate dosage and denies any bleeding issues. Heart healthy diet encouraged.   6. CKD stage 3 Most recent labs reveal stable kidney function. Avoid nephrotoxic agents. Encouraged adequate hydration. Continue to follow with PCP.  7. Hx of syncope Denies any recurrence. This was suspected d/t to severe PH/RV failure without evidence of arrhythmias noted on telemetry. Care and ED precautions discussed. No medication changes at this time. Continue to follow with PCP.  8. Obesity Weight loss via diet and exercise encouraged. Discussed the impact being overweight would have on cardiovascular risk.  Congratulated her on her weight loss so far.    Dispo: Follow-up with Dr. Dina Rich or APP in 4-6 months or sooner if anything changes.   Signed, Sharlene Dory, NP

## 2023-04-10 ENCOUNTER — Encounter (HOSPITAL_COMMUNITY): Payer: Medicare Other

## 2023-04-17 ENCOUNTER — Encounter (HOSPITAL_COMMUNITY): Payer: Medicare Other

## 2023-04-19 ENCOUNTER — Other Ambulatory Visit (HOSPITAL_COMMUNITY): Payer: Self-pay

## 2023-04-19 ENCOUNTER — Telehealth (HOSPITAL_COMMUNITY): Payer: Self-pay | Admitting: Pharmacist

## 2023-04-19 ENCOUNTER — Encounter (HOSPITAL_COMMUNITY): Payer: Medicare Other

## 2023-04-19 NOTE — Telephone Encounter (Signed)
Patient Advocate Encounter   Received notification from [employer] that prior authorizations for Opsumit, sildenafil, and Cordie Grice are required.   PA submitted on CoverMyMeds Key [id number], [id number], [id number] Status is pending   Will continue to follow.  Karle Plumber, PharmD, BCPS, BCCP, CPP Heart Failure Clinic Pharmacist [phone]

## 2023-04-20 NOTE — Telephone Encounter (Signed)
Advanced Heart Failure Patient Sales promotion account executive requested more information for [employer]. This information was sent in via fax.

## 2023-04-23 NOTE — Telephone Encounter (Signed)
Advanced Heart Failure Patient Advocate Encounter  Prior Authorizations for Opsumit, sildenafil and Uptravi have been approved.    Effective dates: 04/23/23 through 05/14/24  Karle Plumber, PharmD, BCPS, BCCP, CPP Heart Failure Clinic Pharmacist [phone]

## 2023-04-24 ENCOUNTER — Encounter (HOSPITAL_COMMUNITY): Payer: Medicare Other

## 2023-04-25 ENCOUNTER — Encounter (HOSPITAL_COMMUNITY): Payer: Self-pay | Admitting: Cardiology

## 2023-04-25 ENCOUNTER — Ambulatory Visit (HOSPITAL_COMMUNITY)
Admission: RE | Admit: 2023-04-25 | Discharge: 2023-04-25 | Disposition: A | Discharge status: Home / Self Care | Payer: Medicare Other | Source: Ambulatory Visit | Attending: Cardiology | Admitting: Cardiology

## 2023-04-25 VITALS — BP 140/60 | HR 70 | Wt 203.6 lb

## 2023-04-25 DIAGNOSIS — I130 Hypertensive heart and chronic kidney disease with heart failure and stage 1 through stage 4 chronic kidney disease, or unspecified chronic kidney disease: Secondary | ICD-10-CM | POA: Insufficient documentation

## 2023-04-25 DIAGNOSIS — N1832 Chronic kidney disease, stage 3b: Secondary | ICD-10-CM | POA: Diagnosis not present

## 2023-04-25 DIAGNOSIS — I50810 Right heart failure, unspecified: Secondary | ICD-10-CM

## 2023-04-25 DIAGNOSIS — E785 Hyperlipidemia, unspecified: Secondary | ICD-10-CM | POA: Diagnosis not present

## 2023-04-25 DIAGNOSIS — E66813 Obesity, class 3: Secondary | ICD-10-CM | POA: Diagnosis not present

## 2023-04-25 DIAGNOSIS — Z6833 Body mass index (BMI) 33.0-33.9, adult: Secondary | ICD-10-CM | POA: Insufficient documentation

## 2023-04-25 DIAGNOSIS — I2510 Atherosclerotic heart disease of native coronary artery without angina pectoris: Secondary | ICD-10-CM | POA: Insufficient documentation

## 2023-04-25 DIAGNOSIS — Z955 Presence of coronary angioplasty implant and graft: Secondary | ICD-10-CM | POA: Diagnosis not present

## 2023-04-25 DIAGNOSIS — R600 Localized edema: Secondary | ICD-10-CM | POA: Insufficient documentation

## 2023-04-25 DIAGNOSIS — Z7901 Long term (current) use of anticoagulants: Secondary | ICD-10-CM | POA: Diagnosis not present

## 2023-04-25 DIAGNOSIS — E1169 Type 2 diabetes mellitus with other specified complication: Secondary | ICD-10-CM

## 2023-04-25 DIAGNOSIS — I252 Old myocardial infarction: Secondary | ICD-10-CM | POA: Insufficient documentation

## 2023-04-25 DIAGNOSIS — I272 Pulmonary hypertension, unspecified: Secondary | ICD-10-CM | POA: Diagnosis not present

## 2023-04-25 DIAGNOSIS — Z853 Personal history of malignant neoplasm of breast: Secondary | ICD-10-CM | POA: Insufficient documentation

## 2023-04-25 DIAGNOSIS — Z6841 Body Mass Index (BMI) 40.0 and over, adult: Secondary | ICD-10-CM

## 2023-04-25 DIAGNOSIS — G4733 Obstructive sleep apnea (adult) (pediatric): Secondary | ICD-10-CM

## 2023-04-25 LAB — BASIC METABOLIC PANEL
Anion gap: 15 (ref 5–15)
BUN: 20 mg/dL (ref 8–23)
CO2: 26 mmol/L (ref 22–32)
Calcium: 9.6 mg/dL (ref 8.9–10.3)
Chloride: 99 mmol/L (ref 98–111)
Creatinine, Ser: 1.45 mg/dL — ABNORMAL HIGH (ref 0.44–1.00)
GFR, Estimated: 39 mL/min — ABNORMAL LOW (ref >60)
Glucose, Bld: 88 mg/dL (ref 70–99)
Potassium: 4 mmol/L (ref 3.5–5.1)
Sodium: 140 mmol/L (ref 135–145)

## 2023-04-25 LAB — BRAIN NATRIURETIC PEPTIDE: B Natriuretic Peptide: 72.2 pg/mL (ref 0.0–100.0)

## 2023-04-25 NOTE — Progress Notes (Signed)
ReDS Vest / Clip - 04/25/23 1400       ReDS Vest / Clip   Station Marker A    Ruler Value 31    ReDS Value Range High volume overload    ReDS Actual Value 41

## 2023-04-25 NOTE — Patient Instructions (Signed)
CHANGE Torsemide to 60 mg Twice daily for 3 days only.  Labs done today, your results will be available in MyChart, we will contact you for abnormal readings.  Your physician has requested that you have an echocardiogram. Echocardiography is a painless test that uses sound waves to create images of your heart. It provides your doctor with information about the size and shape of your heart and how well your heart's chambers and valves are working. This procedure takes approximately one hour. There are no restrictions for this procedure. Please do NOT wear cologne, perfume, aftershave, or lotions (deodorant is allowed). Please arrive 15 minutes prior to your appointment time.  Please note: We ask at that you not bring children with you during ultrasound (echo/ vascular) testing. Due to room size and safety concerns, children are not allowed in the ultrasound rooms during exams. Our front office staff cannot provide observation of children in our lobby area while testing is being conducted. An adult accompanying a patient to their appointment will only be allowed in the ultrasound room at the discretion of the ultrasound technician under special circumstances. We apologize for any inconvenience.  Your physician recommends that you schedule a follow-up appointment in: 2 months  If you have any questions or concerns before your next appointment please send [place] a message through [place] or call our office at [phone].    TO LEAVE A MESSAGE FOR THE NURSE SELECT OPTION 2, PLEASE LEAVE A MESSAGE INCLUDING: YOUR NAME DATE OF BIRTH CALL BACK NUMBER REASON FOR CALL**this is important as we prioritize the call backs  YOU WILL RECEIVE A CALL BACK THE SAME DAY AS LONG AS YOU CALL BEFORE 4:00 PM  At the [facility], you and your health needs are our priority. As part of our continuing mission to provide you with exceptional heart care, we have created designated Provider Care Teams. These  Care Teams include your primary Cardiologist (physician) and Advanced Practice Providers (APPs- Physician Assistants and Nurse Practitioners) who all work together to provide you with the care you need, when you need it.   You may see any of the following providers on your designated Care Team at your next follow up: Dr Arvilla Meres Dr Marca Ancona Dr. Dorthula Nettles Dr. Clearnce Hasten Amy Filbert Schilder, NP Robbie Lis, [place] [facility] [place], [place] Brynda Peon, NP [place] Lee, NP Karle Plumber, PharmD   Please be sure to bring in all your medications bottles to every appointment.    Thank you for choosing [facility]

## 2023-04-25 NOTE — Progress Notes (Addendum)
ADVANCED HEART FAILURE CLINIC NOTE  Referring Physician: Wilmon Pali, FNP  Primary Care: Wilmon Pali, FNP Primary Cardiologist: Dr. Dina Rich  HPI: Sandra Schneider is a 67 y.o. female with CAD (NSTEMI 06/2012 w/ PCI to LAD and Lcx) LVEF 55% at that time, OSA, HTN, hyperlipidemia, hx of breast ca s/p lumpectomy and persistent shortness of breath and lower extremity edema presenting today for follow up. She also follows Dr. Vassie Loll for OSA.  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  She has noticed that over the past several months she has had worsening lower extremity edema and difficulty ambulating due to shortness of breath.  She had a echocardiogram concerning for pulmonary hypertension followed by right heart cath with a mean PA of 69 mmHg.  Her cardiac output was 7 L/min during that case.  She had a workup for interstitial lung disease by pulmonology with CT chest that was negative.  However there was a right upper lobe lung nodule with negative PET scan.  In addition she has had a colonoscopy and mammogram due to hypermetabolic areas in the colon and breast both of which were also negative.   Since her initial appointment we started her on sildenafil 20 mg 3 times daily.  Unfortunately due to continued dietary indiscretion, significant volume intake and medication noncompliance she presented severely hypervolemic during her last appointment in May 2024.  She was directly admitted to the hospital and diuresed 35 pounds during that admission.  Right heart cath confirmed severe precapillary pulmonary hypertension.  Sildenafil was increased to 40 mg 3 times daily.  Interval hx:  - She is now on uptravi, sildenafil and opsumit. Reports that she feels much better now. No lightheadedness or syncopal episodes. Her only complaints of low back pain. Jaw pain has resolved also now.   Activity level/exercise tolerance: NYHA IIB-III; much improved now.   Orthopnea:  Sleeps on 1; complaint with CPAP Paroxysmal noctural dyspnea: Yes Chest pain/pressure:  No Orthostatic lightheadedness:  No Palpitations:  No Lower extremity edema:  Minimal Presyncope/syncope:  No Cough:  no longer coughing; previously was constantly coughing.   Past Medical History:  Diagnosis Date   Breast cancer (HCC)    Coronary artery disease    Diabetes mellitus    GERD (gastroesophageal reflux disease)    Hypertension    Myocardial abscess    Myocardial infarct (HCC) 06/24/2012   Pulmonary hypertension (HCC)     Current Outpatient Medications  Medication Sig Dispense Refill   acetaminophen (TYLENOL) 325 MG tablet Take 650 mg by mouth every 6 (six) hours as needed for moderate pain.     amiodarone (PACERONE) 200 MG tablet Take 1 tablet (200 mg total) by mouth daily. 30 tablet 5   amLODipine (NORVASC) 5 MG tablet Take 1 tablet (5 mg total) by mouth daily. 90 tablet 3   apixaban (ELIQUIS) 5 MG TABS tablet Take 1 tablet (5 mg total) by mouth 2 (two) times daily. 180 tablet 1   Ascorbic Acid (VITAMIN C PO) Take 1 tablet by mouth daily.     atorvastatin (LIPITOR) 40 MG tablet Take 1 tablet by mouth once daily 90 tablet 2   Blood Pressure Monitor DEVI 1 each by Does not apply route daily. 1 each 0   Carboxymethylcellul-Glycerin (REFRESH OPTIVE) 1-0.9 % GEL Place 1 drop into both eyes as needed (dry eyes).     cetirizine (ZYRTEC) 10 MG tablet Take 10 mg by mouth daily.  Cholecalciferol (VITAMIN D) 50 MCG (2000 UT) tablet Take 2,000 Units by mouth daily.     famotidine (PEPCID) 20 MG tablet Take 20 mg by mouth daily.     letrozole (FEMARA) 2.5 MG tablet Take 2.5 mg by mouth daily.     macitentan (OPSUMIT) 10 MG tablet Take 10 mg by mouth daily.     MOUNJARO 10 MG/0.5ML Pen INJECT 10 MG UNDER THE SKIN ONCE A WEEK 2 mL 0   Multiple Vitamin (MULTIVITAMIN) tablet Take 1 tablet by mouth daily.     OXYGEN Inhale 4 L into the lungs continuous.     Polyethyl  Glycol-Propyl Glycol (SYSTANE) 0.4-0.3 % SOLN Place 1 drop into both eyes as needed (dry eyes).     potassium chloride (KLOR-CON M) 10 MEQ tablet Take 2 tablets (20 mEq total) by mouth daily. 60 tablet 11   prednisoLONE acetate (PRED FORTE) 1 % ophthalmic suspension Place 1 drop into both eyes 4 (four) times daily.     Selexipag (UPTRAVI TITRATION) 200 & 800 MCG TBPK Take 200 mcg twice daily. Increase as directed up to 1600 mcg twice daily     sildenafil (REVATIO) 20 MG tablet Take 2 tablets (40 mg total) by mouth 3 (three) times daily. 180 tablet 5   torsemide (DEMADEX) 20 MG tablet TAKE 2 TABLETS BY MOUTH TWICE DAILY **DISCONTINUE  LASIX** 360 tablet 0   No current facility-administered medications for this encounter.    No Known Allergies    Social History   Socioeconomic History   Marital status: Widowed    Spouse name: Not on file   Number of children: Not on file   Years of education: Not on file   Highest education level: Not on file  Occupational History   Not on file  Tobacco Use   Smoking status: Never    Passive exposure: Never   Smokeless tobacco: Never  Vaping Use   Vaping status: Never Used  Substance and Sexual Activity   Alcohol use: No   Drug use: No   Sexual activity: Yes    Birth control/protection: Post-menopausal  Other Topics Concern   Not on file  Social History Narrative   Not on file   Social Determinants of Health   Financial Resource Strain: Low Risk  (08/22/2022)   Received from [facility], [facility]   Overall Financial Resource Strain (CARDIA)    Difficulty of Paying Living Expenses: Not hard at all  Food Insecurity: No Food Insecurity (09/27/2022)   Hunger Vital Sign    Worried About Running Out of Food in the Last Year: Never true    Ran Out of Food in the Last Year: Never true  Transportation Needs: No Transportation Needs (09/27/2022)   PRAPARE - Administrator, Civil Service (Medical): No    Lack of Transportation  (Non-Medical): No  Physical Activity: Insufficiently Active (08/22/2022)   Received from [facility], [facility]   Exercise Vital Sign    Days of Exercise per Week: 3 days    Minutes of Exercise per Session: 30 min  Stress: No Stress Concern Present (08/22/2022)   Received from [facility], [facility] - Occupational Stress Questionnaire    Feeling of Stress : Only a little  Social Connections: Moderately Isolated (08/22/2022)   Received from [facility], [facility]   Social Connection and Isolation Panel [NHANES]    Frequency of  Communication with Friends and Family: More than three times a week    Frequency of Social Gatherings with Friends and Family: More than three times a week    Attends Religious Services: More than 4 times per year    Active Member of [employer] or Organizations: No    Attends Banker Meetings: Never    Marital Status: Widowed  Intimate Partner Violence: Not At Risk (09/27/2022)   Humiliation, Afraid, Rape, and Kick questionnaire    Fear of Current or Ex-Partner: No    Emotionally Abused: No    Physically Abused: No    Sexually Abused: No      Family History  Problem Relation Age of Onset   Heart disease Father    Diabetes Other    Hypertension Other    Cancer Other    Colon cancer Son 22       alive, doing well.    PHYSICAL EXAM: Vitals:   04/25/23 1445  BP: (!) 140/60  Pulse: 70  SpO2: 100%   GENERAL: Well nourished, well developed, and in no apparent distress at rest.  HEENT: Negative for arcus senilis or xanthelasma. There is no scleral icterus.  The mucous membranes are pink and moist.   NECK: Supple, No masses. Normal carotid upstrokes without bruits. No masses or thyromegaly.    CHEST: There are no chest wall deformities. There is no chest wall tenderness. Respirations are unlabored.  Lungs- decreased at right lung base with mild inspiratory crackles CARDIAC:  JVP:  difficult to assess; does not appear elevated cm          Normal rate with regular rhythm. No murmurs, rubs or gallops.  Pulses are 2+ and symmetrical in upper and lower extremities. 1+ edema.  ABDOMEN: Soft, non-tender, non-distended. There are no masses or hepatomegaly. There are normal bowel sounds.  EXTREMITIES: Warm and well perfused with no cyanosis, clubbing.  LYMPHATIC: No axillary or supraclavicular lymphadenopathy.  NEUROLOGIC: Patient is oriented x3 with no focal or lateralizing neurologic deficits.  PSYCH: Patients affect is appropriate, there is no evidence of anxiety or depression.  SKIN: Warm and dry; no lesions or wounds.      DATA REVIEW  ECG:06/13/22: NSR with 1AVB  ECHO: 01/18/22: LVBEF 60-65%. Moderately dilated RV with mild reduction in function. Dilated RA.  CATH: 02/08/23:   Prox Cx to Dist Cx lesion is 25% stenosed.   Mid LAD lesion is 25% stenosed.   2nd Mrg lesion is 100% stenosed.  Right to left collaterals.  Left to left collaterals.   The left ventricular systolic function is normal.   LV end diastolic pressure is mildly elevated.   The left ventricular ejection fraction is greater than 65% by visual estimate.   Hemodynamic findings consistent with severe pulmonary hypertension.   There is no aortic valve stenosis.   Aortic saturation 96% on 3 L nasal cannula, PA saturation 69% on 3 L, mean RA pressure 32 mm Hg; PA pressure 107/48, mean PA pressure 69 mmHg, pulmonary capillary wedge pressure difficult to assess due to significant V waves, and difficulty wedging.  Cardiac output 7.03 L/min, cardiac index 3.07.   Patent stents with mild restenosis in the LAD and circumflex.  Small obtuse marginal appears occluded distally with some right to left collaterals.  No target for PCI.  RHC 09/28/22: RA 32 RV 95/32 PA 93/44, mean 66 PCWP mean 19  Oxygen saturations: PA 49% AO 96%  Cardiac Output (Fick) 3.98  Cardiac Index (Fick) 1.73  PVR 11.8 PAPi 1.5    10/31/22:  HEMODYNAMICS: RA:                  10 mmHg (mean) RV:                  104/3-10 mmHg PA:                  100/27 mmHg (57 mean) PCWP:            9 mmHg (mean)                                      Estimated Fick CO/CI   6.7 L/min, 3.15 L/min/m2                                            TPG                 48  mmHg                                            PVR                 7 Wood Units  PAPi                10.4    PFTS: 03/21/22: 03/2022 moderate restriction, ratio 84, FEV1 65%, FVC 59%, TLC 74%, DLCO 13.6/64%.  ERV 8% with wt 275   12/22/22: :  Pt ambulated 768ft (228.73m) O2 Sat ranged 100-95% on 4L oxygen HR ranged 71-88  ASSESSMENT & PLAN:  Pulmonary hypertension, Group I + III - Initial RHC waveforms reviewed personally. RA 30, RV 107/20-30, PA 107/48 (69), unable to wedge; LVEDP 15. AO 140/64. LVEDP ~16. Assuming a PCWP of 20, TPG would be 49 w/ PVR of 7.  - Hemodynamics consistent with severe pulmonary hypertension largely due to pre-capillary PH.  -Cardiac MRI in 5/24 with EF 75%, moderate RV dilation with RV EF 50%, moderate TR, mid-wall basal septal/basal inferolateral LGE. Could be seen with cardiac sarcoidosis (vs prior myocarditis). CT chest showed RUL nodule (negative on PET) and mediastinal LAN. V/Q scan not suggestive of chronic PEs. RHC 5/16 showed severe PAH with low CI 1.73, PAPi 1.5, RA pressure 32 (R>>L heart failure). TEE this admission with LV EF 60-65%, D-shaped septum, moderate RV dilation with moderate RV dysfunction, mod-severe TR. Likely end-stage PH/cor pulmonale d/t OHS/OSA and group 1 PAH.  - Labs: ANA, HCV negative. HIV pending. LFTs mostly unremarkable.  - PFTs w/ moderate restriction.  - Repeat BMP/BNP today - CT chest w/ pulmonary nodules, however, follow up PET mostly unremarkable.  - Currently on sildenafil 40mg  TID, opsumit & uptravi.  - mildly hypervolemic on torsemide 40mg  BID (REDS 41%); will increase to 60mg  BID for the next 3  days.  - Participating in pulmonary rehab now.   - at follow up.   2. OSA - followed by pulmonology.  - Now using CPAP nightly - Followed by Dr. Vassie Loll; reviewed notes from 01/19/23  3. CAD - LHC as above - followed by general cardiology.  - no CP.   4. Severe obesity - Body mass  index is 33.88 kg/m.  - discussed importance of weight loss; 2lbs down from prior appt - Started on Mounjoro; tolerating it well  5. T2DM - Only taking mounjoro  - A1C now down to 5.3   6. CKD IIIB - sCr 1.5 on 10/0/24 - repeat today.   I spent 37 minutes caring for this patient today including face to face time, ordering and reviewing labs, discussing continued PH therapy, risk of adverse events, prognosis, seeing the patient, documenting in the record, and arranging follow ups.   Sandra Schneider Advanced Heart Failure Mechanical Circulatory Support

## 2023-04-26 ENCOUNTER — Encounter (HOSPITAL_COMMUNITY): Payer: Medicare Other

## 2023-04-27 ENCOUNTER — Encounter (HOSPITAL_COMMUNITY): Payer: Medicare Other | Admitting: Cardiology

## 2023-05-01 ENCOUNTER — Encounter (HOSPITAL_COMMUNITY): Payer: Medicare Other

## 2023-05-03 ENCOUNTER — Encounter (HOSPITAL_COMMUNITY): Payer: Medicare Other

## 2023-05-08 ENCOUNTER — Other Ambulatory Visit: Payer: Self-pay | Admitting: Cardiology

## 2023-05-08 ENCOUNTER — Encounter (HOSPITAL_COMMUNITY): Payer: Medicare Other

## 2023-05-08 DIAGNOSIS — E1165 Type 2 diabetes mellitus with hyperglycemia: Secondary | ICD-10-CM

## 2023-05-10 ENCOUNTER — Encounter (HOSPITAL_COMMUNITY): Payer: Medicare Other

## 2023-05-15 ENCOUNTER — Encounter (HOSPITAL_COMMUNITY): Payer: Medicare Other

## 2023-05-17 ENCOUNTER — Encounter (HOSPITAL_COMMUNITY): Payer: Medicare Other

## 2023-05-19 ENCOUNTER — Other Ambulatory Visit: Payer: Self-pay | Admitting: Cardiology

## 2023-05-21 ENCOUNTER — Telehealth: Payer: Self-pay | Admitting: Pulmonary Disease

## 2023-05-21 DIAGNOSIS — J9611 Chronic respiratory failure with hypoxia: Secondary | ICD-10-CM

## 2023-05-21 DIAGNOSIS — G4733 Obstructive sleep apnea (adult) (pediatric): Secondary | ICD-10-CM

## 2023-05-21 NOTE — Telephone Encounter (Signed)
 Yes if you would place new 02 order we may have to fax to [facility]

## 2023-05-21 NOTE — Telephone Encounter (Signed)
 Patient states she is running low on her oxygen supplies and [employer] is not in network with her insurance.[facility] ----can we help?   [phone]

## 2023-05-22 ENCOUNTER — Telehealth: Payer: Self-pay | Admitting: Pulmonary Disease

## 2023-05-22 ENCOUNTER — Encounter (HOSPITAL_COMMUNITY): Payer: Medicare Other

## 2023-05-22 NOTE — Telephone Encounter (Signed)
 Please see last encounter. PT also needs tubing for her 02 order going thru [facility]. TY.

## 2023-05-29 ENCOUNTER — Ambulatory Visit: Payer: Medicare Other | Admitting: Diagnostic Neuroimaging

## 2023-06-06 ENCOUNTER — Other Ambulatory Visit: Payer: Self-pay | Admitting: Cardiology

## 2023-06-06 DIAGNOSIS — E1165 Type 2 diabetes mellitus with hyperglycemia: Secondary | ICD-10-CM

## 2023-06-07 ENCOUNTER — Other Ambulatory Visit (HOSPITAL_COMMUNITY): Payer: Self-pay | Admitting: Cardiology

## 2023-06-07 NOTE — Telephone Encounter (Signed)
This is a CHF pt 

## 2023-06-08 NOTE — Telephone Encounter (Signed)
I called the pt and someone answered the phone but did not speak  Will try back later

## 2023-06-13 NOTE — Telephone Encounter (Signed)
Order placed. Waiting on cosign from Dr Vassie Loll.

## 2023-06-13 NOTE — Addendum Note (Signed)
Addended by: Jama Flavors on: 06/13/2023 01:15 PM   Modules accepted: Orders

## 2023-06-15 ENCOUNTER — Telehealth: Payer: Self-pay | Admitting: Pulmonary Disease

## 2023-06-15 NOTE — Telephone Encounter (Signed)
Called and spoke with [facility] from [employer]. I informed Geraldine Contras that I was returning a call from [place] to clarify the information she was needing, so I could provide that for her. Geraldine Contras stated that Elease Hashimoto was currently with another call, and she could call me back. I informed Geraldine Contras that our office was closing for the weekend and if Elease Hashimoto would just call back at her earliest convenience on Monday. Dee verbalized that Elease Hashimoto would.

## 2023-06-18 NOTE — Telephone Encounter (Signed)
Ralene Cork I tried calling the office, Was waiting over . Please pass along my direct number [phone] if I don't hear back by 11am I will try calling again.

## 2023-06-18 NOTE — Telephone Encounter (Signed)
Elease Hashimoto from [employer] is returning phone call. Elease Hashimoto phone number is [phone].

## 2023-06-19 NOTE — Telephone Encounter (Signed)
Aldean Ast. Lmtcb

## 2023-06-19 NOTE — Telephone Encounter (Signed)
Elease Hashimoto is returning phone call. Elease Hashimoto phone number is [phone].

## 2023-06-21 NOTE — Telephone Encounter (Signed)
 01/19/23 not with Dr Villa Greaser states: AMb sat at [facility] or [facility] [address] office with POC to see if she qualifies  I do not see a recent walk. LMOM for Sandra Schneider to call.

## 2023-06-28 NOTE — Telephone Encounter (Signed)
I called the pt and the line rings multiple times and then someone answered but did not speak  She needs ov with walk test with APP at [facility]  Will call her back later

## 2023-06-28 NOTE — Telephone Encounter (Signed)
Routing to front office to schedule pt for this.

## 2023-07-01 ENCOUNTER — Other Ambulatory Visit: Payer: Self-pay | Admitting: Cardiology

## 2023-07-01 DIAGNOSIS — E1165 Type 2 diabetes mellitus with hyperglycemia: Secondary | ICD-10-CM

## 2023-07-02 NOTE — Progress Notes (Signed)
 ADVANCED HEART FAILURE CLINIC NOTE  Referring Physician: Wilmon Pali, FNP  Primary Care: Wilmon Pali, FNP Primary Cardiologist: Dr. Dina Rich  HPI: Kurt Azimi is a 68 y.o. female with CAD (NSTEMI 06/2012 w/ PCI to LAD and Lcx) LVEF 55% at that time, OSA, HTN, hyperlipidemia, hx of breast ca s/p lumpectomy and persistent shortness of breath and lower extremity edema presenting today for follow up. She also follows Dr. Vassie Loll for OSA.  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  She has noticed that over the past several months she has had worsening lower extremity edema and difficulty ambulating due to shortness of breath.  She had a echocardiogram concerning for pulmonary hypertension followed by right heart cath with a mean PA of 69 mmHg.  Her cardiac output was 7 L/min during that case.  She had a workup for interstitial lung disease by pulmonology with CT chest that was negative.  However there was a right upper lobe lung nodule with negative PET scan.  In addition she has had a colonoscopy and mammogram due to hypermetabolic areas in the colon and breast both of which were also negative.   Since her initial appointment we started her on sildenafil 20 mg 3 times daily.  Unfortunately due to continued dietary indiscretion, significant volume intake and medication noncompliance she presented severely hypervolemic during her last appointment in May 2024.  She was directly admitted to the hospital and diuresed 35 pounds during that admission.  Right heart cath confirmed severe precapillary pulmonary hypertension.  Sildenafil was increased to 40 mg 3 times daily.  Interval hx:  - She is now on uptravi, sildenafil and opsumit.  - Continues to do very well; she has no lightheadedness, chest pain, she now exercises at the [facility] and tries to walk daily but has been limited by weather recently.  - She ran out of sildenafil last week.   Activity  level/exercise tolerance: NYHA IIB Orthopnea:  Sleeps on 1; complaint with CPAP Paroxysmal noctural dyspnea: No Chest pain/pressure:  No Orthostatic lightheadedness:  No Palpitations:  No Lower extremity edema:  No Presyncope/syncope:  No Cough: No  Current Outpatient Medications  Medication Sig Dispense Refill   acetaminophen (TYLENOL) 325 MG tablet Take 650 mg by mouth every 6 (six) hours as needed for moderate pain.     amiodarone (PACERONE) 200 MG tablet Take 1 tablet (200 mg total) by mouth daily. 30 tablet 5   amLODipine (NORVASC) 5 MG tablet Take 1 tablet (5 mg total) by mouth daily. 90 tablet 3   apixaban (ELIQUIS) 5 MG TABS tablet Take 1 tablet (5 mg total) by mouth 2 (two) times daily. 180 tablet 1   Ascorbic Acid (VITAMIN C PO) Take 1 tablet by mouth daily.     atorvastatin (LIPITOR) 40 MG tablet Take 1 tablet by mouth once daily 90 tablet 3   Blood Pressure Monitor DEVI 1 each by Does not apply route daily. 1 each 0   Carboxymethylcellul-Glycerin (REFRESH OPTIVE) 1-0.9 % GEL Place 1 drop into both eyes as needed (dry eyes).     cetirizine (ZYRTEC) 10 MG tablet Take 10 mg by mouth daily.     Cholecalciferol (VITAMIN D) 50 MCG (2000 UT) tablet Take 2,000 Units by mouth daily.     famotidine (PEPCID) 20 MG tablet Take 20 mg by mouth daily.     letrozole (FEMARA) 2.5 MG tablet Take 2.5 mg by mouth daily.     macitentan (  OPSUMIT) 10 MG tablet Take 10 mg by mouth daily.     Multiple Vitamin (MULTIVITAMIN) tablet Take 1 tablet by mouth daily.     OXYGEN Inhale 4 L into the lungs continuous.     Polyethyl Glycol-Propyl Glycol (SYSTANE) 0.4-0.3 % SOLN Place 1 drop into both eyes as needed (dry eyes).     potassium chloride (KLOR-CON M) 10 MEQ tablet Take 2 tablets (20 mEq total) by mouth daily. 60 tablet 11   prednisoLONE acetate (PRED FORTE) 1 % ophthalmic suspension Place 1 drop into both eyes 4 (four) times daily.     Selexipag (UPTRAVI TITRATION) 200 & 800 MCG TBPK Take 200 mcg  twice daily. Increase as directed up to 1600 mcg twice daily     sildenafil (REVATIO) 20 MG tablet Take 2 tablets (40 mg total) by mouth 3 (three) times daily. 180 tablet 5   tirzepatide (MOUNJARO) 12.5 MG/0.5ML Pen Inject 12.5 mg into the skin once a week. 2 mL 0   torsemide (DEMADEX) 20 MG tablet TAKE 2 TABLETS BY MOUTH TWICE DAILY -  STOP  LASIX 360 tablet 0   No current facility-administered medications for this encounter.    No Known Allergies  PHYSICAL EXAM: Vitals:   07/03/23 1010  BP: (!) 140/70  Pulse: 63  SpO2: 100%   GENERAL: NAD Lungs- CTA  CARDIAC:  JVP: 5 cm          Normal rate with regular rhythm. no murmur.  Pulses 1+. no edema.  ABDOMEN: Soft, non-tender, non-distended.  EXTREMITIES: Warm and well perfused.  NEUROLOGIC: No obvious FND    DATA REVIEW  ECG:06/13/22: NSR with 1AVB  ECHO: 01/18/22: LVBEF 60-65%. Moderately dilated RV with mild reduction in function. Dilated RA.  CATH: 02/08/23:   Prox Cx to Dist Cx lesion is 25% stenosed.   Mid LAD lesion is 25% stenosed.   2nd Mrg lesion is 100% stenosed.  Right to left collaterals.  Left to left collaterals.   The left ventricular systolic function is normal.   LV end diastolic pressure is mildly elevated.   The left ventricular ejection fraction is greater than 65% by visual estimate.   Hemodynamic findings consistent with severe pulmonary hypertension.   There is no aortic valve stenosis.   Aortic saturation 96% on 3 L nasal cannula, PA saturation 69% on 3 L, mean RA pressure 32 mm Hg; PA pressure 107/48, mean PA pressure 69 mmHg, pulmonary capillary wedge pressure difficult to assess due to significant V waves, and difficulty wedging.  Cardiac output 7.03 L/min, cardiac index 3.07.   Patent stents with mild restenosis in the LAD and circumflex.  Small obtuse marginal appears occluded distally with some right to left collaterals.  No target for PCI.  RHC 09/28/22: RA 32 RV 95/32 PA 93/44, mean 66 PCWP  mean 19  Oxygen saturations: PA 49% AO 96%  Cardiac Output (Fick) 3.98  Cardiac Index (Fick) 1.73 PVR 11.8 PAPi 1.5   10/31/22:  HEMODYNAMICS: RA:                  10 mmHg (mean) RV:                  104/3-10 mmHg PA:                  100/27 mmHg (57 mean) PCWP:            9 mmHg (mean)  Estimated Fick CO/CI   6.7 L/min, 3.15 L/min/m2                                            TPG                 48  mmHg                                            PVR                 7 Wood Units  PAPi                10.4    PFTS: 03/21/22: 03/2022 moderate restriction, ratio 84, FEV1 65%, FVC 59%, TLC 74%, DLCO 13.6/64%.  ERV 8% with wt 275   12/22/22: :  Pt ambulated 756ft (228.103m) O2 Sat ranged 100-95% on 4L oxygen HR ranged 71-88  07/03/23: Pt ambulated 1100 ft (335.28 m) O2 Sat ranged 99-100 on 2 L oxygen HR ranged 65-71   ASSESSMENT & PLAN:  Pulmonary hypertension, Group I + III - Initial RHC waveforms reviewed personally. RA 30, RV 107/20-30, PA 107/48 (69), unable to wedge; LVEDP 15. AO 140/64. LVEDP ~16. Assuming a PCWP of 20, TPG would be 49 w/ PVR of 7.  - Hemodynamics consistent with severe pulmonary hypertension largely due to pre-capillary PH.  -Cardiac MRI in 5/24 with EF 75%, moderate RV dilation with RV EF 50%, moderate TR, mid-wall basal septal/basal inferolateral LGE. Could be seen with cardiac sarcoidosis (vs prior myocarditis). CT chest showed RUL nodule (negative on PET) and mediastinal LAN. V/Q scan not suggestive of chronic PEs. RHC 5/16 showed severe PAH with low CI 1.73, PAPi 1.5, RA pressure 32 (R>>L heart failure). TEE this admission with LV EF 60-65%, D-shaped septum, moderate RV dilation with moderate RV dysfunction, mod-severe TR. Likely end-stage PH/cor pulmonale d/t OHS/OSA and group 1 PAH.  - Labs: ANA, HCV negative. HIV pending. LFTs mostly unremarkable.  - PFTs w/ moderate restriction.  - CT chest w/ pulmonary  nodules, however, follow up PET mostly unremarkable.  - Currently on sildenafil 40mg  TID, opsumit & uptravi.  - Participating in pulmonary rehab now.   - Excellent ; she was previously on 4L O2 with prior walk distance of 237m; today on 2L O2 she walked 31m with no hypoxia. Will continue current triple therapy.  - ReDs reading: 32 %, abnormal. Will take one extra torsemide today otherwise continue 40mg  BID - Repeat BMP/BNP   2. OSA - followed by pulmonology.  - Now using CPAP nightly - Followed by Dr. Vassie Loll; reviewed notes from 01/19/23 - Seen by Ames Dura on 07/04/23; weaning night time O2 as able.   3. CAD - LHC as above - followed by general cardiology.  - no CP.   4. Severe obesity - Body mass index is 31.78 kg/m.  - Started on mounjoro; losing weight; very motivated.   5. T2DM - Only taking mounjoro  - A1C now down to 5.3   6. CKD IIIB - sCr 1.5 on 10/0/24 - repeat BMP today.   Tiron Suski Advanced Heart Failure Mechanical Circulatory Support

## 2023-07-03 ENCOUNTER — Encounter (HOSPITAL_COMMUNITY): Payer: Self-pay | Admitting: Cardiology

## 2023-07-03 ENCOUNTER — Ambulatory Visit (HOSPITAL_COMMUNITY)
Admission: RE | Admit: 2023-07-03 | Discharge: 2023-07-03 | Disposition: A | Discharge status: Home / Self Care | Payer: Medicare Other | Source: Ambulatory Visit | Attending: Cardiology | Admitting: Cardiology

## 2023-07-03 VITALS — BP 140/70 | HR 63 | Wt 191.0 lb

## 2023-07-03 DIAGNOSIS — I50810 Right heart failure, unspecified: Secondary | ICD-10-CM

## 2023-07-03 DIAGNOSIS — I080 Rheumatic disorders of both mitral and aortic valves: Secondary | ICD-10-CM | POA: Insufficient documentation

## 2023-07-03 DIAGNOSIS — I3139 Other pericardial effusion (noninflammatory): Secondary | ICD-10-CM | POA: Diagnosis not present

## 2023-07-03 DIAGNOSIS — E785 Hyperlipidemia, unspecified: Secondary | ICD-10-CM | POA: Diagnosis not present

## 2023-07-03 DIAGNOSIS — I130 Hypertensive heart and chronic kidney disease with heart failure and stage 1 through stage 4 chronic kidney disease, or unspecified chronic kidney disease: Secondary | ICD-10-CM | POA: Insufficient documentation

## 2023-07-03 DIAGNOSIS — E1169 Type 2 diabetes mellitus with other specified complication: Secondary | ICD-10-CM

## 2023-07-03 DIAGNOSIS — E66813 Obesity, class 3: Secondary | ICD-10-CM

## 2023-07-03 DIAGNOSIS — Z6841 Body Mass Index (BMI) 40.0 and over, adult: Secondary | ICD-10-CM

## 2023-07-03 DIAGNOSIS — I272 Pulmonary hypertension, unspecified: Secondary | ICD-10-CM

## 2023-07-03 DIAGNOSIS — E1122 Type 2 diabetes mellitus with diabetic chronic kidney disease: Secondary | ICD-10-CM | POA: Diagnosis not present

## 2023-07-03 DIAGNOSIS — G4730 Sleep apnea, unspecified: Secondary | ICD-10-CM | POA: Diagnosis not present

## 2023-07-03 DIAGNOSIS — I509 Heart failure, unspecified: Secondary | ICD-10-CM | POA: Diagnosis not present

## 2023-07-03 DIAGNOSIS — N189 Chronic kidney disease, unspecified: Secondary | ICD-10-CM | POA: Insufficient documentation

## 2023-07-03 DIAGNOSIS — N183 Chronic kidney disease, stage 3 unspecified: Secondary | ICD-10-CM | POA: Diagnosis not present

## 2023-07-03 LAB — ECHOCARDIOGRAM COMPLETE
Area-P 1/2: 3.12 cm2
MV M vel: 5.24 m/s
MV Peak grad: 109.8 mm[Hg]
MV VTI: 1.26 cm2
S' Lateral: 2.6 cm

## 2023-07-03 LAB — COMPREHENSIVE METABOLIC PANEL
ALT: 39 U/L (ref 0–44)
AST: 31 U/L (ref 15–41)
Albumin: 4.2 g/dL (ref 3.5–5.0)
Alkaline Phosphatase: 90 U/L (ref 38–126)
Anion gap: 11 (ref 5–15)
BUN: 23 mg/dL (ref 8–23)
CO2: 27 mmol/L (ref 22–32)
Calcium: 9.2 mg/dL (ref 8.9–10.3)
Chloride: 101 mmol/L (ref 98–111)
Creatinine, Ser: 1.46 mg/dL — ABNORMAL HIGH (ref 0.44–1.00)
GFR, Estimated: 39 mL/min — ABNORMAL LOW (ref >60)
Glucose, Bld: 92 mg/dL (ref 70–99)
Potassium: 4 mmol/L (ref 3.5–5.1)
Sodium: 139 mmol/L (ref 135–145)
Total Bilirubin: 0.7 mg/dL (ref 0.0–1.2)
Total Protein: 7.2 g/dL (ref 6.5–8.1)

## 2023-07-03 LAB — BRAIN NATRIURETIC PEPTIDE: B Natriuretic Peptide: 87.9 pg/mL (ref 0.0–100.0)

## 2023-07-03 MED ORDER — SILDENAFIL CITRATE 20 MG PO TABS: 40.0000 mg | ORAL_TABLET | Freq: Three times a day (TID) | ORAL | 5 refills | Status: DC

## 2023-07-03 NOTE — Patient Instructions (Signed)
Medication Changes:  SILDENAFIL REFILLED   Lab Work:  Labs done today, your results will be available in MyChart, we will contact you for abnormal readings.  Follow-Up in: 2 MONTHS PLEASE CALL OUR OFFICE AROUND MARCH  TO GET SCHEDULED FOR YOUR APPOINTMENT. PHONE NUMBER IS [phone] OPTION 2   At the [facility], you and your health needs are our priority. We have a designated team specialized in the treatment of Heart Failure. This Care Team includes your primary Heart Failure Specialized Cardiologist (physician), Advanced Practice Providers (APPs- Physician Assistants and Nurse Practitioners), and Pharmacist who all work together to provide you with the care you need, when you need it.   You may see any of the following providers on your designated Care Team at your next follow up:  Dr. Arvilla Meres Dr. Marca Ancona Dr. Dorthula Nettles Dr. Theresia Bough Tonye Becket, NP Robbie Lis, [place] [facility] Devine, [place] Brynda Peon, NP [place] Lee, NP Karle Plumber, PharmD   Please be sure to bring in all your medications bottles to every appointment.   Need to Contact us:  If you have any questions or concerns before your next appointment please send [place] a message through [place] or call our office at [phone].    TO LEAVE A MESSAGE FOR THE NURSE SELECT OPTION 2, PLEASE LEAVE A MESSAGE INCLUDING: YOUR NAME DATE OF BIRTH CALL BACK NUMBER REASON FOR CALL**this is important as we prioritize the call backs  YOU WILL RECEIVE A CALL BACK THE SAME DAY AS LONG AS YOU CALL BEFORE 4:00 PM

## 2023-07-03 NOTE — Progress Notes (Signed)
ReDS Vest / Clip - 07/03/23 1000       ReDS Vest / Clip   Station Marker B    Ruler Value 32    ReDS Value Range Moderate volume overload    ReDS Actual Value 37

## 2023-07-03 NOTE — Progress Notes (Signed)
6 Min Walk Test Completed  Pt ambulated 1100 ft (335.28 m) O2 Sat ranged 99-100 on 2 L oxygen HR ranged 65-71

## 2023-07-03 NOTE — Progress Notes (Signed)
  Echocardiogram 2D Echocardiogram has been performed.  Sandra Schneider 07/03/2023, 9:41 AM

## 2023-07-04 ENCOUNTER — Ambulatory Visit: Payer: Medicare Other | Admitting: Primary Care

## 2023-07-04 ENCOUNTER — Encounter: Payer: Self-pay | Admitting: Primary Care

## 2023-07-04 VITALS — BP 113/58 | HR 64 | Ht 65.0 in | Wt 191.0 lb

## 2023-07-04 DIAGNOSIS — J9611 Chronic respiratory failure with hypoxia: Secondary | ICD-10-CM

## 2023-07-04 DIAGNOSIS — G4733 Obstructive sleep apnea (adult) (pediatric): Secondary | ICD-10-CM

## 2023-07-04 NOTE — Patient Instructions (Addendum)
-  CHRONIC RESPIRATORY FAILURE: Chronic respiratory failure means your lungs are not able to get enough oxygen into your blood. We will order an overnight oximetry test to see if you can reduce your nighttime oxygen from 4 liters to 2 liters. Continue your current oxygen use until we have the test results, and monitor your oxygen levels during the day.  -PULMONARY HYPERTENSION: Pulmonary hypertension is high blood pressure in the arteries of your lungs. Continue taking your current medications: Uptravi, sildenafil, and Opsumit.  -OBSTRUCTIVE SLEEP APNEA: Obstructive sleep apnea is a condition where your breathing stops and starts during sleep. Continue using your CPAP machine every night and aim for at least 4 hours of use per night.  -WEIGHT MANAGEMENT: We need to clarify your current dose of Mounjaro, a medication to help with weight loss. Please check with your prescribing provider to confirm whether you should be taking 10 mg or 12.5 mg.  -GENERAL HEALTH MAINTENANCE: We will try to get you a portable oxygen concentrator to help you move around more easily. Keep up with your physical activity and try to return to the gym when you can.  INSTRUCTIONS:  We will follow up with an overnight oximetry test to determine if you can reduce your nighttime oxygen. Please continue to monitor your oxygen levels during the day and adjust as needed. Also, confirm your [facility] dosage with your prescribing provider.  Orders: ONO on CPAP with 2L oxygen Best fit eval with [employer]tech for POC   Follow-up 6 months with Beth NP or sooner if needed

## 2023-07-04 NOTE — Progress Notes (Signed)
@Patient  ID: Aldona Bar, female    DOB: Feb 09, 1956, 68 y.o.   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    PMH -hypertension Diabetes type 2 CAD status post DES to LAD and left circumflex Breast cancer 2021 s/p lumpectomy and radiation, now on Femara   Discussed the use of AI scribe software for clinical note transcription with the patient, who gave verbal consent to proceed.  History of Present Illness   Tama Grosz is a 68 year old female with chronic respiratory failure and pulmonary hypertension who presents for follow-up care.  She has chronic respiratory failure and is currently on four liters of oxygen. Occasionally, she goes without oxygen during the day, with levels generally remaining in the 90s. She has not been able to continue pulmonary rehabilitation due to cost but maintains an exercise routine, including walking. A recent six-minute walk test with cardiology showed she ambulated 1100 feet with oxygen levels ranging from 99 to 100% on two liters, and her heart rate ranged from 65 to 71.  She has pulmonary hypertension and is on medications including Uptravi, sildenafil, and Opsumit. She also uses a diuretic, torsemide, and a weight loss medication, Mounjaro. She is uncertain about her current dosage of Mounjaro, as her prescription indicates 12.5 mg, but she recalls taking 10 mg. An echocardiogram performed recently.  She has severe sleep apnea, as evidenced by a home sleep study in March 2024 showing 40 apneic events per hour and oxygen levels dropping to 50%, with an average of 84%. A subsequent titration study in May 2024 determined an optimal CPAP pressure  of 15 and recommended two liters of oxygen at night. However, she has been using four liters based on previous instructions. Her CPAP settings were adjusted to auto settings with a minimum pressure of 10 and a maximum of 15, which she feels has been effective. She uses her CPAP consistently, missing only one or two nights a month, and her recent download shows an average use of five hours and 46 minutes per night with an apnea score of two events per hour.  She qualifies for a portable oxygen concentrator but has not yet received it. She currently uses a non-portable oxygen system, which she finds cumbersome for activities like attending church.      Airview download 06/20/23/07/01/23 Usage days 12/12 days (100%) greater than 4 hours Average usage 5 hours 46 minutes Pressure 10 to 15 cm H2O (12.9 cm H2O-95%) Air leak 17.1 L/min (95%) AHI 2.1   No Known Allergies  Immunization History  Administered Date(s) Administered   Fluad Quad(high Dose 65+) 03/11/2021   Influenza-Unspecified 03/18/1996   Moderna Covid-19 Fall Seasonal Vaccine 15yrs & older 03/01/2023   Moderna Sars-Covid-2 Vaccination 07/24/2019, 08/23/2019, 09/22/2019, 04/21/2020, 09/29/2020   Pneumococcal Polysaccharide-23 06/24/2012   Td 08/19/1990   Tdap 02/08/2016    Past Medical History:  Diagnosis Date   Breast cancer (HCC)    Coronary artery disease    Diabetes mellitus    GERD (gastroesophageal reflux disease)    Hypertension    Myocardial abscess    Myocardial infarct (HCC) 06/24/2012  Pulmonary hypertension (HCC)     Tobacco History: Social History   Tobacco Use  Smoking Status Never   Passive exposure: Never  Smokeless Tobacco Never   Counseling given: Not Answered   Outpatient Medications Prior to Visit  Medication Sig Dispense Refill   acetaminophen (TYLENOL) 325 MG tablet Take 650 mg by mouth every 6 (six) hours as needed for moderate pain.     amiodarone (PACERONE) 200 MG tablet Take 1 tablet  (200 mg total) by mouth daily. 30 tablet 5   amLODipine (NORVASC) 5 MG tablet Take 1 tablet (5 mg total) by mouth daily. 90 tablet 3   apixaban (ELIQUIS) 5 MG TABS tablet Take 1 tablet (5 mg total) by mouth 2 (two) times daily. 180 tablet 1   Ascorbic Acid (VITAMIN C PO) Take 1 tablet by mouth daily.     atorvastatin (LIPITOR) 40 MG tablet Take 1 tablet by mouth once daily 90 tablet 3   Blood Pressure Monitor DEVI 1 each by Does not apply route daily. 1 each 0   Carboxymethylcellul-Glycerin (REFRESH OPTIVE) 1-0.9 % GEL Place 1 drop into both eyes as needed (dry eyes).     cetirizine (ZYRTEC) 10 MG tablet Take 10 mg by mouth daily.     Cholecalciferol (VITAMIN D) 50 MCG (2000 UT) tablet Take 2,000 Units by mouth daily.     famotidine (PEPCID) 20 MG tablet Take 20 mg by mouth daily.     letrozole (FEMARA) 2.5 MG tablet Take 2.5 mg by mouth daily.     macitentan (OPSUMIT) 10 MG tablet Take 10 mg by mouth daily.     Multiple Vitamin (MULTIVITAMIN) tablet Take 1 tablet by mouth daily.     OXYGEN Inhale 4 L into the lungs continuous.     Polyethyl Glycol-Propyl Glycol (SYSTANE) 0.4-0.3 % SOLN Place 1 drop into both eyes as needed (dry eyes).     potassium chloride (KLOR-CON) 10 MEQ tablet Take 20 mEq by mouth daily.     prednisoLONE acetate (PRED FORTE) 1 % ophthalmic suspension Place 1 drop into both eyes 4 (four) times daily.     sildenafil (REVATIO) 20 MG tablet Take 2 tablets (40 mg total) by mouth 3 (three) times daily. 180 tablet 5   tirzepatide (MOUNJARO) 12.5 MG/0.5ML Pen Inject 12.5 mg into the skin once a week. 2 mL 0   torsemide (DEMADEX) 20 MG tablet TAKE 2 TABLETS BY MOUTH TWICE DAILY -  STOP  LASIX 360 tablet 0   UPTRAVI 1600 MCG TABS Take 1,600 mcg by mouth daily.     potassium chloride (KLOR-CON M) 10 MEQ tablet Take 2 tablets (20 mEq total) by mouth daily. 60 tablet 11   Selexipag (UPTRAVI TITRATION) 200 & 800 MCG TBPK Take 200 mcg twice daily. Increase as directed up to 1600 mcg  twice daily     No facility-administered medications prior to visit.      Review of Systems  Review of Systems  Constitutional: Negative.   HENT: Negative.    Respiratory: Negative.      Physical Exam  BP (!) 113/58   Pulse 64   Ht 5\' 5"  (1.651 m)   Wt 191 lb (86.6 kg)   LMP 01/14/2011   SpO2 98% Comment: ROOM AIR  BMI 31.78 kg/m  Physical Exam Constitutional:      Appearance: Normal appearance. She is normal weight. She is not ill-appearing.  Cardiovascular:     Rate and Rhythm: Normal rate and regular rhythm.  Comments: No leg swelling Pulmonary:     Effort: Pulmonary effort is normal.     Breath sounds: Normal breath sounds.  Musculoskeletal:        General: Normal range of motion.  Skin:    General: Skin is warm and dry.  Neurological:     General: No focal deficit present.     Mental Status: She is alert and oriented to person, place, and time. Mental status is at baseline.  Psychiatric:        Mood and Affect: Mood normal.        Behavior: Behavior normal.        Thought Content: Thought content normal.        Judgment: Judgment normal.      Lab Results:  CBC    Component Value Date/Time   WBC 6.5 12/22/2022 0930   RBC 3.02 (L) 12/22/2022 0930   HGB 10.9 (L) 01/05/2023 1000   HGB 12.4 09/20/2022 1105   HCT 32.0 (L) 01/05/2023 1000   HCT 38.0 09/20/2022 1105   PLT 236 12/22/2022 0930   PLT 213 09/20/2022 1105   MCV 99.7 12/22/2022 0930   MCV 96 09/20/2022 1105   MCH 32.8 12/22/2022 0930   MCHC 32.9 12/22/2022 0930   RDW 14.4 12/22/2022 0930   RDW 13.9 09/20/2022 1105   LYMPHSABS 0.8 09/20/2022 1105   MONOABS 0.7 09/08/2022 0302   EOSABS 0.0 09/20/2022 1105   BASOSABS 0.0 09/20/2022 1105    BMET    Component Value Date/Time   NA 139 07/03/2023 1100   NA 139 09/20/2022 1105   K 4.0 07/03/2023 1100   CL 101 07/03/2023 1100   CO2 27 07/03/2023 1100   GLUCOSE 92 07/03/2023 1100   BUN 23 07/03/2023 1100   BUN 55 (H) 09/20/2022 1105    CREATININE 1.46 (H) 07/03/2023 1100   CREATININE 1.00 07/03/2012 1700   CALCIUM 9.2 07/03/2023 1100   GFRNONAA 39 (L) 07/03/2023 1100   GFRAA >60 07/18/2019 1008    BNP    Component Value Date/Time   BNP 87.9 07/03/2023 1100    ProBNP    Component Value Date/Time   PROBNP 173.3 (H) 06/23/2012 2031    Imaging: ECHOCARDIOGRAM COMPLETE Result Date: 07/03/2023    ECHOCARDIOGRAM REPORT   Patient Name:   JENALYN GIRDNER Date of Exam: 07/03/2023 Medical Rec #:  [id number]              Height:       65.0 in Accession #:    [id number]             Weight:       203.6 lb Date of Birth:  March 22, 1956               BSA:          1.993 m Patient Age:    67 years               BP:           139/62 mmHg Patient Gender: F                      HR:           66 bpm. Exam Location:  Outpatient Procedure: 2D Echo (Both Spectral and Color Flow Doppler were utilized during            procedure). Indications:    congestive heart failure  History:  Patient has prior history of Echocardiogram examinations, most                 recent 10/01/2022. CHF, chronic kidney disease; Risk                 Factors:Hypertension, Diabetes, Dyslipidemia and Sleep Apnea.  Sonographer:    Delcie Roch RDCS Referring Phys: [phone] ADITYA SABHARWAL IMPRESSIONS  1. Left ventricular ejection fraction, by estimation, is 65 to 70%. The left ventricle has normal function. The left ventricle has no regional wall motion abnormalities. Left ventricular diastolic function could not be evaluated.  2. Right ventricular systolic function is normal. The right ventricular size is normal. There is severely elevated pulmonary artery systolic pressure. The estimated right ventricular systolic pressure is 67.3 mmHg.  3. Left atrial size was moderately dilated.  4. Right atrial size was mildly dilated.  5. A small pericardial effusion is present. There is no evidence of cardiac tamponade.  6. The mitral valve is abnormal. Mild mitral valve  regurgitation. Mild mitral stenosis. The mean mitral valve gradient is 7.0 mmHg. Severe mitral annular calcification.  7. The aortic valve is grossly normal. Aortic valve regurgitation is trivial. No aortic stenosis is present.  8. The inferior vena cava is normal in size with greater than 50% respiratory variability, suggesting right atrial pressure of 3 mmHg. Comparison(s): Changes from prior study are noted. RV size/function normalized compared to prior, TR improved. RVSP severely elevated at 67 but improved from prior level of 80 mmHg. FINDINGS  Left Ventricle: Left ventricular ejection fraction, by estimation, is 65 to 70%. The left ventricle has normal function. The left ventricle has no regional wall motion abnormalities. Strain imaging was not performed. The left ventricular internal cavity  size was normal in size. There is no left ventricular hypertrophy. Left ventricular diastolic function could not be evaluated due to mitral annular calcification (moderate or greater). Left ventricular diastolic function could not be evaluated. Right Ventricle: The right ventricular size is normal. Right vetricular wall thickness was not well visualized. Right ventricular systolic function is normal. There is severely elevated pulmonary artery systolic pressure. The tricuspid regurgitant velocity is 4.01 m/s, and with an assumed right atrial pressure of 3 mmHg, the estimated right ventricular systolic pressure is 67.3 mmHg. Left Atrium: Left atrial size was moderately dilated. Right Atrium: Right atrial size was mildly dilated. Pericardium: A small pericardial effusion is present. There is no evidence of cardiac tamponade. Mitral Valve: MVA by deceleration time 3.12 cm2. The mitral valve is abnormal. Severe mitral annular calcification. Mild mitral valve regurgitation. Mild mitral valve stenosis. MV peak gradient, 18.7 mmHg. The mean mitral valve gradient is 7.0 mmHg with average heart rate of 66 bpm. Tricuspid Valve:  The tricuspid valve is normal in structure. Tricuspid valve regurgitation is mild . No evidence of tricuspid stenosis. Aortic Valve: The aortic valve is grossly normal. Aortic valve regurgitation is trivial. No aortic stenosis is present. Pulmonic Valve: The pulmonic valve was not well visualized. Pulmonic valve regurgitation is trivial. No evidence of pulmonic stenosis. Aorta: The ascending aorta was not well visualized and the aortic root is normal in size and structure. Venous: The inferior vena cava is normal in size with greater than 50% respiratory variability, suggesting right atrial pressure of 3 mmHg. IAS/Shunts: The atrial septum is grossly normal. Additional Comments: 3D imaging was not performed.  LEFT VENTRICLE PLAX 2D LVIDd:         4.70 cm   Diastology LVIDs:  2.60 cm   LV e' medial:    6.53 cm/s LV PW:         1.00 cm   LV E/e' medial:  23.0 LV IVS:        1.00 cm   LV e' lateral:   8.49 cm/s LVOT diam:     2.00 cm   LV E/e' lateral: 17.7 LV SV:         82 LV SV Index:   41 LVOT Area:     3.14 cm  RIGHT VENTRICLE             IVC RV Basal diam:  2.90 cm     IVC diam: 1.50 cm RV S prime:     13.30 cm/s TAPSE (M-mode): 2.5 cm LEFT ATRIUM             Index        RIGHT ATRIUM           Index LA diam:        3.60 cm 1.81 cm/m   RA Area:     16.50 cm LA Vol (A2C):   77.5 ml 38.88 ml/m  RA Volume:   44.40 ml  22.28 ml/m LA Vol (A4C):   81.8 ml 41.04 ml/m LA Biplane Vol: 81.6 ml 40.94 ml/m  AORTIC VALVE LVOT Vmax:   101.00 cm/s LVOT Vmean:  70.400 cm/s LVOT VTI:    0.262 m  AORTA Ao Root diam: 2.60 cm MITRAL VALVE                TRICUSPID VALVE MV Area (PHT): 3.12 cm     TR Peak grad:   64.3 mmHg MV Area VTI:   1.26 cm     TR Vmax:        401.00 cm/s MV Peak grad:  18.7 mmHg MV Mean grad:  7.0 mmHg     SHUNTS MV Vmax:       2.16 m/s     Systemic VTI:  0.26 m MV Vmean:      128.0 cm/s   Systemic Diam: 2.00 cm MV Decel Time: 243 msec MR Peak grad: 109.8 mmHg MR Mean grad: 76.0 mmHg MR Vmax:       524.00 cm/s MR Vmean:     417.0 cm/s MV E velocity: 150.00 cm/s MV A velocity: 118.00 cm/s MV E/A ratio:  1.27 Jodelle Red MD Electronically signed by Jodelle Red MD Signature Date/Time: 07/03/2023/4:48:41 PM    Final      Assessment & Plan:   1. OSA on CPAP (Primary) - Ambulatory Referral for DME  2. Chronic respiratory failure with hypoxia (HCC) - Ambulatory Referral for DME     Chronic Respiratory Failure On 4L of oxygen and CPAP at night. Recent 6-minute walk test showed O2 saturation of 99-100% on 2L. Consider decreasing nighttime oxygen requirement based on walk test and previous titration study. -Order overnight oximetry test on 2L of oxygen with CPAP. -Continue current oxygen regimen until test results are available. -Advise patient to monitor oxygen levels during the day and adjust oxygen use as needed.  Pulmonary Hypertension On Uptravi, sildenafil, and Opsumit. No changes reported. -Continue current medication regimen per cardiology   Obstructive Sleep Apnea Severe sleep apnea managed with CPAP. Recent download showed good compliance and effective treatment. -Continue CPAP use nightly 10-15cm h20. -Advise patient to maintain at least 4 hours of use per night.  Weight Management - Continue Mounjaro as directed  General Health Maintenance -Attempt  to qualify patient for a portable oxygen concentrator for increased mobility. -Encourage continued physical activity and return to gym when possible.      Glenford Bayley, NP 07/04/2023

## 2023-07-07 ENCOUNTER — Other Ambulatory Visit (HOSPITAL_COMMUNITY): Payer: Self-pay | Admitting: Cardiology

## 2023-07-08 ENCOUNTER — Encounter (HOSPITAL_COMMUNITY): Payer: Self-pay | Admitting: Cardiology

## 2023-07-10 ENCOUNTER — Ambulatory Visit: Payer: Medicare Other | Admitting: Adult Health

## 2023-07-21 ENCOUNTER — Other Ambulatory Visit: Payer: Self-pay | Admitting: Cardiology

## 2023-07-23 NOTE — Telephone Encounter (Signed)
 Prescription refill request for Eliquis received. Indication: AF Last office visit: 07/03/23  A Sabharwal DO Scr: 1.46 on 07/03/23  Epic Age: 68 Weight: 86.6kg  Based on above findings Eliquis 5mg  twice daily is the appropriate dose.  Refill approved.

## 2023-07-24 NOTE — Telephone Encounter (Signed)
 I called and spoke to [facility] with [employer] [facility]. Elease Hashimoto stated that the pt was with [facility]. [employer] has now bought them out. Elease Hashimoto stated any new or previous customers of [facility] would need a new lov and a new o2 start order. The lov must have the provider stating the reasoning of why pt is on or must have o2. Pt would also have to have a new o2 start order. Elease Hashimoto stated that she has access to Epic so we do not have to fax papers to her but, if we send this as a community referral, she will receive it.  Pt was scheduled to see Tammy 06/2023 but cancelled. Please have pt scheduled in office at the [address] location so she can be reevaluated, and a qualifying walk can be done.   Pt must keep appointment or we can not help her with her oxygen order. Everything must be new and she has to be seen. [facility] will only accept the lov and order within last 30 days of scheduled appointment.   Routing to front desk so they can schedule appointment. This also needs to be routed to which ever provider she is scheduled to see as an FYI. Make sure appointment notes states she needs a re qualifying walk.

## 2023-07-25 NOTE — Telephone Encounter (Signed)
 LM for PT to call us for appt.

## 2023-07-26 ENCOUNTER — Encounter: Payer: Self-pay | Admitting: Adult Health

## 2023-07-26 NOTE — Telephone Encounter (Signed)
 Sent Pharmacist, hospital. (No MYCHART)

## 2023-08-01 ENCOUNTER — Other Ambulatory Visit: Payer: Self-pay | Admitting: Cardiology

## 2023-08-01 DIAGNOSIS — E1165 Type 2 diabetes mellitus with hyperglycemia: Secondary | ICD-10-CM

## 2023-08-01 DIAGNOSIS — E1169 Type 2 diabetes mellitus with other specified complication: Secondary | ICD-10-CM

## 2023-08-06 ENCOUNTER — Encounter: Payer: Self-pay | Admitting: Cardiology

## 2023-08-06 ENCOUNTER — Ambulatory Visit: Payer: Medicare Other | Attending: Cardiology | Admitting: Cardiology

## 2023-08-06 VITALS — BP 124/52 | HR 66 | Ht 66.0 in | Wt 187.8 lb

## 2023-08-06 DIAGNOSIS — I10 Essential (primary) hypertension: Secondary | ICD-10-CM | POA: Diagnosis not present

## 2023-08-06 DIAGNOSIS — I272 Pulmonary hypertension, unspecified: Secondary | ICD-10-CM

## 2023-08-06 DIAGNOSIS — I50810 Right heart failure, unspecified: Secondary | ICD-10-CM

## 2023-08-06 DIAGNOSIS — D6869 Other thrombophilia: Secondary | ICD-10-CM

## 2023-08-06 DIAGNOSIS — I6529 Occlusion and stenosis of unspecified carotid artery: Secondary | ICD-10-CM

## 2023-08-06 DIAGNOSIS — I2510 Atherosclerotic heart disease of native coronary artery without angina pectoris: Secondary | ICD-10-CM

## 2023-08-06 DIAGNOSIS — I4892 Unspecified atrial flutter: Secondary | ICD-10-CM

## 2023-08-06 NOTE — Progress Notes (Signed)
 Clinical Summary Sandra Schneider is a 68 y.o.female seen today for follow up of the following medical problems   1. CAD - history of NSTEMI 06/2012, received DES to prox LAD and DES to mid LCX - 06/2012 echo LVEF 55-60%, grade I DDx 06/2023 echo: LVEF 65-70%, no WMAs, indet diastolic, RV normal systolic function and size, severe pulm HTN PASP 67     - 01/2022 echo LVEF 65-70%, no WMAs, grade I dd, mild RV dysfunction, mod to severely enlarged RV, severe pulm HTN PASP 80, mild mitrla stenosis,  - 01/2022 cath: LAD 25%, LCX 25%, OM2 occluded, RCA LIs  - no recent chest pains. No SOB/DOE - compliant with meds   2. Pulmonary HTN/RV failure - 01/2022 echo LVEF 65-70%, no WMAs, grade I dd, mild RV dysfunction, mod to severely enlarged RV, severe pulm HTN PASP 80, mild mitrla stenosis,    01/2022 RHC/LHC:  No significant CAD. Mean PA 69, PCWP pulmonary capillary wedge pressure difficult to assess due to significant V waves, and difficulty wedging.LVEDP 16   02/2022 VQ scan: no PE CT chest: 0.9 cm apical RUL nodule, Nonspecific mild right paratracheal and right subcarinal lymphadenopathy. Primary bronchogenic malignancy with nodal metastatic disease not excluded. Suggest PET-CT for further characterization of these findings. PFTs mod restriction OSA: severe OSA on sleep study, followed by Dr Vassie Loll  - followed in HF clinic, from notes combined group I and III pulmonary HTN - currently on uptravi, sildenafil, macitentan - no SOB/DOE, no LE edema   3.Thyroid nodule       4. HTN - has not taken meds yet.        5. Hyperlipidemia - she is on atorvastatin 40mg  daily  - 02/2023 TC 103 TG 98 HDL 61 LDL 22     6. Breast cancer - recent lumpectomy - reports just being monitored at this time.    7. Varicose veins - followed by vascular  8. OSA - followed by pulmonary  9. Aflutter - new diagnosis during 09/2022 admission - s/p DCCV 09/2022 - has been on amiodarone - denies any  palpitations, EKG today shows NSR - no bleeding on eliquis.    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  Past Medical History:  Diagnosis Date   Breast cancer (HCC)    Coronary artery disease    Diabetes mellitus    GERD (gastroesophageal reflux disease)    Hypertension    Myocardial abscess    Myocardial infarct (HCC) 06/24/2012   Pulmonary hypertension (HCC)      No Known Allergies   Current Outpatient Medications  Medication Sig Dispense Refill   tirzepatide (MOUNJARO) 15 MG/0.5ML Pen Inject 15 mg into the skin once a week. 2 mL 5   acetaminophen (TYLENOL) 325 MG tablet Take 650 mg by mouth every 6 (six) hours as needed for moderate pain.     amiodarone (PACERONE) 200 MG tablet Take 1 tablet (200 mg total) by mouth daily. 30 tablet 5   amLODipine (NORVASC) 5 MG tablet Take 1 tablet by mouth once daily 90 tablet 3   apixaban (ELIQUIS) 5 MG TABS tablet Take 1 tablet by mouth twice daily 180 tablet 1   Ascorbic Acid (VITAMIN C PO) Take 1 tablet by mouth daily.     atorvastatin (LIPITOR) 40 MG tablet Take 1 tablet by mouth once daily 90 tablet 3   Blood Pressure Monitor DEVI 1 each by Does not apply route daily. 1 each 0  Carboxymethylcellul-Glycerin (REFRESH OPTIVE) 1-0.9 % GEL Place 1 drop into both eyes as needed (dry eyes).     cetirizine (ZYRTEC) 10 MG tablet Take 10 mg by mouth daily.     Cholecalciferol (VITAMIN D) 50 MCG (2000 UT) tablet Take 2,000 Units by mouth daily.     famotidine (PEPCID) 20 MG tablet Take 20 mg by mouth daily.     letrozole (FEMARA) 2.5 MG tablet Take 2.5 mg by mouth daily.     macitentan (OPSUMIT) 10 MG tablet Take 10 mg by mouth daily.     Multiple Vitamin (MULTIVITAMIN) tablet Take 1 tablet by mouth daily.     OXYGEN Inhale 4 L into the lungs continuous.     Polyethyl Glycol-Propyl Glycol (SYSTANE) 0.4-0.3 % SOLN Place 1 drop into both eyes as needed (dry eyes).     potassium chloride (KLOR-CON) 10 MEQ tablet Take 20 mEq by  mouth daily.     prednisoLONE acetate (PRED FORTE) 1 % ophthalmic suspension Place 1 drop into both eyes 4 (four) times daily.     sildenafil (REVATIO) 20 MG tablet Take 2 tablets (40 mg total) by mouth 3 (three) times daily. 180 tablet 5   torsemide (DEMADEX) 20 MG tablet TAKE 2 TABLETS BY MOUTH TWICE DAILY -  STOP  LASIX 360 tablet 0   UPTRAVI 1600 MCG TABS Take 1,600 mcg by mouth daily.     No current facility-administered medications for this visit.     Past Surgical History:  Procedure Laterality Date   ANTERIOR VITRECTOMY Left 07/21/2019   Procedure: ANTERIOR VITRECTOMY;  Surgeon: Fabio Pierce, MD;  Location: [facility];  Service: Ophthalmology;  Laterality: Left;   ANTERIOR VITRECTOMY Right 08/04/2019   Procedure: ANTERIOR VITRECTOMY;  Surgeon: Fabio Pierce, MD;  Location: [facility];  Service: Ophthalmology;  Laterality: Right;   CARDIAC CATHETERIZATION     CARDIOVERSION N/A 09/29/2022   Procedure: CARDIOVERSION;  Surgeon: Laurey Morale, MD;  Location: [facility] INVASIVE CV LAB;  Service: Cardiovascular;  Laterality: N/A;   CARDIOVERSION N/A 10/05/2022   Procedure: CARDIOVERSION;  Surgeon: Dolores Patty, MD;  Location: [facility] INVASIVE CV LAB;  Service: Cardiovascular;  Laterality: N/A;   CATARACT EXTRACTION W/PHACO Left 07/21/2019   Procedure: CATARACT EXTRACTION PHACO AND INTRAOCULAR LENS PLACEMENT (IOC) (CDE: 11.45);  Surgeon: Fabio Pierce, MD;  Location: [facility];  Service: Ophthalmology;  Laterality: Left;   CATARACT EXTRACTION W/PHACO Right 08/04/2019   Procedure: CATARACT EXTRACTION PHACO AND INTRAOCULAR LENS PLACEMENT (IOC);  Surgeon: Fabio Pierce, MD;  Location: [facility];  Service: Ophthalmology;  Laterality: Right;  CDE: 9.78   CESAREAN SECTION     COLONOSCOPY WITH PROPOFOL N/A 05/22/2022   Procedure: COLONOSCOPY WITH PROPOFOL;  Surgeon: Lanelle Bal, DO;  Location: [facility] SUITE;  Service: Endoscopy;  Laterality: N/A;  10:00 am   CORONARY STENT PLACEMENT N/A 06/24/2012    LEFT HEART CATHETERIZATION WITH CORONARY ANGIOGRAM N/A 06/24/2012   Procedure: LEFT HEART CATHETERIZATION WITH CORONARY ANGIOGRAM;  Surgeon: Dolores Patty, MD;  Location: [facility] CATH LAB;  Service: Cardiovascular;  Laterality: N/A;   right breast lumpectomy     RIGHT HEART CATH N/A 09/28/2022   Procedure: RIGHT HEART CATH;  Surgeon: Laurey Morale, MD;  Location: [facility] INVASIVE CV LAB;  Service: Cardiovascular;  Laterality: N/A;   RIGHT HEART CATH N/A 10/31/2022   Procedure: RIGHT HEART CATH;  Surgeon: Dorthula Nettles, DO;  Location: [facility] INVASIVE CV LAB;  Service: Cardiovascular;  Laterality: N/A;   RIGHT HEART CATH N/A  01/05/2023   Procedure: RIGHT HEART CATH;  Surgeon: Dorthula Nettles, DO;  Location: [facility] INVASIVE CV LAB;  Service: Cardiovascular;  Laterality: N/A;   RIGHT/LEFT HEART CATH AND CORONARY ANGIOGRAPHY N/A 02/07/2022   Procedure: RIGHT/LEFT HEART CATH AND CORONARY ANGIOGRAPHY;  Surgeon: Corky Crafts, MD;  Location: [facility] INVASIVE CV LAB;  Service: Cardiovascular;  Laterality: N/A;   TEE WITHOUT CARDIOVERSION N/A 09/29/2022   Procedure: TRANSESOPHAGEAL ECHOCARDIOGRAM;  Surgeon: Laurey Morale, MD;  Location: [facility] INVASIVE CV LAB;  Service: Cardiovascular;  Laterality: N/A;     No Known Allergies    Family History  Problem Relation Age of Onset   Heart disease Father    Diabetes Other    Hypertension Other    Cancer Other    Colon cancer Son 22       alive, doing well.     Social History Ms. Ennis reports that she has never smoked. She has never been exposed to tobacco smoke. She has never used smokeless tobacco. Ms. Lea reports no history of alcohol use.   Physical Examination Today's Vitals   08/06/23 0858  BP: (!) 124/52  Pulse: 66  SpO2: 96%  Weight: 187 lb 12.8 oz (85.2 kg)  Height: 5\' 6"  (1.676 m)   Body mass index is 30.31 kg/m.  Gen: resting comfortably, no acute distress HEENT: no scleral icterus, pupils equal round and reactive, no  palptable cervical adenopathy,  CV: RRR, no m/rg. +bilateral carotid bruits Resp: Clear to auscultation bilaterally GI: abdomen is soft, non-tender, non-distended, normal bowel sounds, no hepatosplenomegaly MSK: extremities are warm, no edema.  Skin: warm, no rash Neuro:  no focal deficits Psych: appropriate affect   Diagnostic Studies  06/2012 echo Study Conclusions   - Left ventricle: The cavity size was normal. Wall thickness    was normal. Systolic function was normal. The estimated    ejection fraction was in the range of 55% to 60%. Wall    motion was normal; there were no regional wall motion    abnormalities. Doppler parameters are consistent with    abnormal left ventricular relaxation (grade 1 diastolic    dysfunction). Doppler parameters are consistent with high    ventricular filling pressure.  - Mitral valve: Calcified annulus.  - Left atrium: The atrium was mildly dilated.  - Atrial septum: There was increased thickness of the    septum, consistent with lipomatous hypertrophy.      06/2012 cath     Findings:   Ao Pressure: 125/59 (83) LV Pressure:  137/11/20 There was no signficant gradient across the aortic valve on pullback.   Left main: Short. Normal   LAD: Large vessel with proximal 80% lesion. Mid vessel 30%. Distal vessel has diffuse moderate disease.    LCX: Large vessel gives off 3 OMs. 99% midsection followed by 40%. Distal vessel is small with 70% lesion.    RCA: Dominant. Normal.    LV-gram done in the RAO projection: Ejection fraction = 60%. No regional wall motion abnormalities.    Assessment: 1. 2V CAD 2. Normal LV function 3. NSTEMI   Plan/Discussion:   Plan PCI of LCX & LAD.     PCI Data: Vessel - proximal and mid left circumflex/Segment - proximal and mid Percent Stenosis (pre)  95% TIMI-flow 3 Stent 3.0 x 32 mm Promus drug-eluting stent postdilated with a 3.5 noncompliant balloon. Percent Stenosis (post) 0% TIMI-flow (post)  3     Vessel - LAD/Segment - proximal  Percent Stenosis (pre)  85% TIMI-flow 3 Stent 3.0 x 20 mm Promus drug-eluting stent postdilated with a 3.5 noncompliant balloon. Percent Stenosis (post) 0% TIMI-flow (post) 3   Final Conclusions:   Successful angioplasty and drug-eluting stent placement to the proximal/mid left circumflex as well as proximal left anterior descending artery.    Recommendations:  Recommend dual antiplatelet therapy for at least one year. Aggressive treatment of risk factors is recommended.     01/2022 echo 1. Left ventricular ejection fraction, by estimation, is 65 to 70%. The  left ventricle has normal function. The left ventricle has no regional  wall motion abnormalities. Left ventricular diastolic parameters are  consistent with Grade I diastolic  dysfunction (impaired relaxation).   2. Right ventricular systolic function is mildly reduced. The right  ventricular size is moderately to severely enlarged. There is severely  elevated pulmonary artery systolic pressure.   3. Left atrial size was mildly dilated.   4. Right atrial size was mildly dilated.   5. The mitral valve is abnormal. No evidence of mitral valve  regurgitation. Mild mitral stenosis. Moderate mitral annular  calcification.   6. The tricuspid valve is abnormal. Tricuspid valve regurgitation is mild  to moderate.   7. The aortic valve has an indeterminant number of cusps. Aortic valve  regurgitation is not visualized. No aortic stenosis is present.   8. The inferior vena cava is dilated in size with >50% respiratory  variability, suggesting right atrial pressure of 8 mmHg.    01/2022 RHC/LHC  Prox Cx to Dist Cx lesion is 25% stenosed.   Mid LAD lesion is 25% stenosed.   2nd Mrg lesion is 100% stenosed.  Right to left collaterals.  Left to left collaterals.   The left ventricular systolic function is normal.   LV end diastolic pressure is mildly elevated.   The left ventricular ejection  fraction is greater than 65% by visual estimate.   Hemodynamic findings consistent with severe pulmonary hypertension.   There is no aortic valve stenosis.   Aortic saturation 96% on 3 L nasal cannula, PA saturation 69% on 3 L, mean RA pressure 32 mm Hg; PA pressure 107/48, mean PA pressure 69 mmHg, pulmonary capillary wedge pressure difficult to assess due to significant V waves, and difficulty wedging.  Cardiac output 7.03 L/min, cardiac index 3.07.   Patent stents with mild restenosis in the LAD and circumflex.  Small obtuse marginal appears occluded distally with some right to left collaterals.  No target for PCI.   Most likely her symptoms are coming from her severe pulmonary artery hypertension.  She states she will be starting CPAP therapy for obstructive sleep apnea soon.   07/2022 echo 1. Left ventricular ejection fraction, by estimation, is 60 to 65%. The  left ventricle has normal function. The left ventricle has no regional  wall motion abnormalities. Left ventricular diastolic parameters are  consistent with Grade I diastolic  dysfunction (impaired relaxation).   2. Right ventricular systolic function is severely reduced. The right  ventricular size is severely enlarged. There is severely elevated  pulmonary artery systolic pressure.   3. Left atrial size was mildly dilated.   4. Right atrial size was moderately dilated.   5. The mitral valve is abnormal. Trivial mitral valve regurgitation. No  evidence of mitral stenosis. Severe mitral annular calcification.   6. Tricuspid valve regurgitation is severe.   7. The aortic valve is tricuspid. There is mild calcification of the  aortic valve. Aortic valve regurgitation is not visualized. Aortic  valve  sclerosis is present, with no evidence of aortic valve stenosis.   8. The inferior vena cava is dilated in size with <50% respiratory  variability, suggesting right atrial pressure of 15 mmHg.   9. Agitated saline contrast bubble  study was negative, with no evidence  of any interatrial shunt.      Assessment and Plan  1.Pulmonary HTN/RV failure - followed by HF clinic, she is on sildenafil, selexipag, macitentan - euvolemic today without symptoms, continue curren tmeds   2. CAD - no symptoms, continue current meds   3. Hyperlipidemia -well controlled, continue current meds  4. HTN - at goal, continue current meds  5. Aflutter/acquired thrombophilia - EKG today shows NSR, no symptoms - continue current meds including amio, eliquis for stroke prevention   6. Carotid stenosis - mild by 2021 [place], bilateral bruits on exam, repeat carotid US   Antoine Poche, M.D.

## 2023-08-06 NOTE — Patient Instructions (Signed)
 Medication Instructions:  Continue all current medications.  Labwork: none  Testing/Procedures: Your physician has requested that you have a carotid duplex. This test is an ultrasound of the carotid arteries in your neck. It looks at blood flow through these arteries that supply the brain with blood. Allow one hour for this exam. There are no restrictions or special instructions. Office will contact with results via phone, letter or mychart.     Follow-Up: 6 months   Any Other Special Instructions Will Be Listed Below (If Applicable).   If you need a refill on your cardiac medications before your next appointment, please call your pharmacy.

## 2023-08-22 ENCOUNTER — Encounter

## 2023-08-27 ENCOUNTER — Ambulatory Visit: Attending: Cardiology

## 2023-08-27 DIAGNOSIS — I6522 Occlusion and stenosis of left carotid artery: Secondary | ICD-10-CM

## 2023-08-27 DIAGNOSIS — I6529 Occlusion and stenosis of unspecified carotid artery: Secondary | ICD-10-CM

## 2023-08-30 NOTE — Progress Notes (Signed)
 4r  ADVANCED HEART FAILURE CLINIC NOTE  Referring Physician: Joanne Muckle, FNP  Primary Care: Sandra Muckle, FNP Primary Cardiologist: Dr. Armida Lander  HPI: Sandra Schneider is a 68 y.o. female with CAD (NSTEMI 06/2012 w/ PCI to LAD and Lcx) LVEF 55% at that time, OSA, HTN, hyperlipidemia, hx of breast ca s/p lumpectomy and persistent shortness of breath and lower extremity edema presenting today for follow up. She also follows Dr. Alva for OSA.  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  She has noticed that over the past several months she has had worsening lower extremity edema and difficulty ambulating due to shortness of breath.  She had a echocardiogram concerning for pulmonary hypertension followed by right heart cath with a mean PA of 69 mmHg.  Her cardiac output was 7 L/min during that case.  She had a workup for interstitial lung disease by pulmonology with CT chest that was negative.  However there was a right upper lobe lung nodule with negative PET scan.  In addition she has had a colonoscopy and mammogram due to hypermetabolic areas in the colon and breast both of which were also negative.   Since her initial appointment we started her on sildenafil  20 mg 3 times daily.  Unfortunately due to continued dietary indiscretion, significant volume intake and medication noncompliance she presented severely hypervolemic during her last appointment in May 2024.  She was directly admitted to the hospital and diuresed 35 pounds during that admission.  Right heart cath confirmed severe precapillary pulmonary hypertension.  Sildenafil  was increased to 40 mg 3 times daily.  Interval hx:  - She is now on uptravi , sildenafil  and opsumit.  -Continues to do very well; shortness of breath is at baseline. She wears 4L [place] throughout the day. Reports that she sometimes doesn't wear it through the day without significant dyspnea. O2 sats drop to 93% at the lowerst.    Activity level/exercise tolerance: NYHA IIB Orthopnea:  Sleeps on 1; complaint with CPAP Paroxysmal noctural dyspnea: No Chest pain/pressure:  No Orthostatic lightheadedness:  No Palpitations:  No Lower extremity edema:  No Presyncope/syncope:  No Cough: No  Current Outpatient Medications  Medication Sig Dispense Refill   acetaminophen  (TYLENOL ) 325 MG tablet Take 650 mg by mouth every 6 (six) hours as needed for moderate pain.     amiodarone  (PACERONE ) 200 MG tablet Take 1 tablet (200 mg total) by mouth daily. 30 tablet 5   amLODipine  (NORVASC ) 5 MG tablet Take 1 tablet by mouth once daily 90 tablet 3   apixaban  (ELIQUIS ) 5 MG TABS tablet Take 1 tablet by mouth twice daily 180 tablet 1   Ascorbic Acid (VITAMIN C PO) Take 1 tablet by mouth daily.     atorvastatin  (LIPITOR ) 40 MG tablet Take 1 tablet by mouth once daily 90 tablet 3   Blood Pressure Monitor DEVI 1 each by Does not apply route daily. 1 each 0   Carboxymethylcellul-Glycerin (REFRESH OPTIVE) 1-0.9 % GEL Place 1 drop into both eyes as needed (dry eyes).     cetirizine (ZYRTEC) 10 MG tablet Take 10 mg by mouth daily.     Cholecalciferol (VITAMIN D) 50 MCG (2000 UT) tablet Take 2,000 Units by mouth daily.     famotidine  (PEPCID ) 20 MG tablet Take 20 mg by mouth daily.     letrozole (FEMARA) 2.5 MG tablet Take 2.5 mg by mouth daily.     macitentan (OPSUMIT) 10 MG tablet Take 10  mg by mouth daily.     Multiple Vitamin (MULTIVITAMIN) tablet Take 1 tablet by mouth daily.     OXYGEN  Inhale 4 L into the lungs continuous.     Polyethyl Glycol-Propyl Glycol (SYSTANE) 0.4-0.3 % SOLN Place 1 drop into both eyes as needed (dry eyes).     potassium chloride  (KLOR-CON ) 10 MEQ tablet Take 20 mEq by mouth daily.     prednisoLONE acetate (PRED FORTE) 1 % ophthalmic suspension Place 1 drop into both eyes in the morning and at bedtime.     sildenafil  (REVATIO ) 20 MG tablet Take 2 tablets (40 mg total) by mouth 3 (three) times daily. 180  tablet 5   tirzepatide  (MOUNJARO ) 15 MG/0.5ML Pen Inject 15 mg into the skin once a week. (Patient taking differently: Inject 15 mg into the skin once a week. Takes on Wednesdays) 2 mL 5   torsemide  (DEMADEX ) 20 MG tablet TAKE 2 TABLETS BY MOUTH TWICE DAILY -  STOP  LASIX  360 tablet 0   UPTRAVI  1600 MCG TABS Take 1,600 mcg by mouth daily.     No current facility-administered medications for this encounter.    No Known Allergies  PHYSICAL EXAM: Vitals:   08/31/23 1016  BP: (!) 130/58  Pulse: 71  SpO2: 100%   GENERAL: NAD Lungs- CTA CARDIAC:  JVP: 6 cm          Normal rate with regular rhythm. No murmur.  Pulses 2+. No edema.  ABDOMEN: Soft, non-tender, non-distended.  EXTREMITIES: Warm and well perfused.  NEUROLOGIC: No obvious FND     DATA REVIEW  ECG:06/13/22: NSR with 1AVB  ECHO: 01/18/22: LVBEF 60-65%. Moderately dilated RV with mild reduction in function. Dilated RA.  CATH: 02/08/23:   Prox Cx to Dist Cx lesion is 25% stenosed.   Mid LAD lesion is 25% stenosed.   2nd Mrg lesion is 100% stenosed.  Right to left collaterals.  Left to left collaterals.   The left ventricular systolic function is normal.   LV end diastolic pressure is mildly elevated.   The left ventricular ejection fraction is greater than 65% by visual estimate.   Hemodynamic findings consistent with severe pulmonary hypertension.   There is no aortic valve stenosis.   Aortic saturation 96% on 3 L nasal cannula, PA saturation 69% on 3 L, mean RA pressure 32 mm Hg; PA pressure 107/48, mean PA pressure 69 mmHg, pulmonary capillary wedge pressure difficult to assess due to significant V waves, and difficulty wedging.  Cardiac output 7.03 L/min, cardiac index 3.07.   Patent stents with mild restenosis in the LAD and circumflex.  Small obtuse marginal appears occluded distally with some right to left collaterals.  No target for PCI.  RHC 09/28/22: RA 32 RV 95/32 PA 93/44, mean 66 PCWP mean 19  Oxygen   saturations: PA 49% AO 96%  Cardiac Output (Fick) 3.98  Cardiac Index (Fick) 1.73 PVR 11.8 PAPi 1.5   10/31/22:  HEMODYNAMICS: RA:                  10 mmHg (mean) RV:                  104/3-10 mmHg PA:                  100/27 mmHg (57 mean) PCWP:            9 mmHg (mean)  Estimated Fick CO/CI   6.7 L/min, 3.15 L/min/m2                                            TPG                 48  mmHg                                            PVR                 7 Wood Units  PAPi                10.4    PFTS: 03/21/22: 03/2022 moderate restriction, ratio 84, FEV1 65%, FVC 59%, TLC 74%, DLCO 13.6/64%.  ERV 8% with wt 275   12/22/22: :  Pt ambulated 737ft (228.38m) O2 Sat ranged 100-95% on 4L oxygen  HR ranged 71-88  07/03/23: Pt ambulated 1100 ft (335.28 m) O2 Sat ranged 99-100 on 2 L oxygen  HR ranged 65-71   ASSESSMENT & PLAN:  Pulmonary hypertension, Group I + III - Initial RHC waveforms reviewed personally. RA 30, RV 107/20-30, PA 107/48 (69), unable to wedge; LVEDP 15. AO 140/64. LVEDP ~16. Assuming a PCWP of 20, TPG would be 49 w/ PVR of 7.  - Hemodynamics consistent with severe pulmonary hypertension largely due to pre-capillary PH.  -Cardiac MRI in 5/24 with EF 75%, moderate RV dilation with RV EF 50%, moderate TR, mid-wall basal septal/basal inferolateral LGE. Could be seen with cardiac sarcoidosis (vs prior myocarditis). CT chest showed RUL nodule (negative on PET) and mediastinal LAN. V/Q scan not suggestive of chronic PEs. RHC 5/16 showed severe PAH with low CI 1.73, PAPi 1.5, RA pressure 32 (R>>L heart failure). TEE this admission with LV EF 60-65%, D-shaped septum, moderate RV dilation with moderate RV dysfunction, mod-severe TR. Likely end-stage PH/cor pulmonale d/t OHS/OSA and group 1 PAH.  - Labs: ANA, HCV negative. HIV pending. LFTs mostly unremarkable.  - PFTs w/ moderate restriction.  - CT chest w/ pulmonary nodules, however,  follow up PET mostly unremarkable.  - Currently on sildenafil  40mg  TID, opsumit & uptravi .  - Participating in pulmonary rehab now.   - Excellent ; she was previously on 4L O2 with prior walk distance of 272m; today on 2L O2 she walked 327m with no hypoxia. Will continue current triple therapy. Still waiting for pulmonology to decrease total O2.  - Repeat BMP/BNP today.   2. OSA - followed by pulmonology.  - Now using CPAP nightly - Followed by Dr. Villa Greaser; reviewed notes from 01/19/23 - Seen by Eulas Hick on 07/04/23; weaning night time O2 as able.  - Reached out to Eulas Hick today to wean O2 to 2L.   3. CAD - LHC as above - followed by general cardiology.  - no chest pain.   4. Severe obesity - Body mass index is 29.34 kg/m.  - On Mounjoro; continuing to lose weight. Feels very well.   5. T2DM - Only taking mounjoro  - A1C now down to 5.3  - followed by PCP.   6. CKD IIIB - sCr 1.5 on 10/0/24 - repeat BMP today.   I spent 32 minutes caring for this patient today including face  to face time, ordering and reviewing labs, discussing, seeing the patient, documenting in the record, and arranging follow ups.   Suzann Lazaro Advanced Heart Failure Mechanical Circulatory Support

## 2023-08-31 ENCOUNTER — Ambulatory Visit (HOSPITAL_COMMUNITY)
Admission: RE | Admit: 2023-08-31 | Discharge: 2023-08-31 | Disposition: A | Discharge status: Home / Self Care | Source: Ambulatory Visit | Attending: Cardiology | Admitting: Cardiology

## 2023-08-31 VITALS — BP 130/58 | HR 71 | Wt 181.8 lb

## 2023-08-31 DIAGNOSIS — E1122 Type 2 diabetes mellitus with diabetic chronic kidney disease: Secondary | ICD-10-CM | POA: Insufficient documentation

## 2023-08-31 DIAGNOSIS — I2510 Atherosclerotic heart disease of native coronary artery without angina pectoris: Secondary | ICD-10-CM | POA: Insufficient documentation

## 2023-08-31 DIAGNOSIS — J9611 Chronic respiratory failure with hypoxia: Secondary | ICD-10-CM

## 2023-08-31 DIAGNOSIS — E785 Hyperlipidemia, unspecified: Secondary | ICD-10-CM | POA: Diagnosis not present

## 2023-08-31 DIAGNOSIS — Z79899 Other long term (current) drug therapy: Secondary | ICD-10-CM | POA: Diagnosis not present

## 2023-08-31 DIAGNOSIS — E669 Obesity, unspecified: Secondary | ICD-10-CM | POA: Diagnosis not present

## 2023-08-31 DIAGNOSIS — Z6829 Body mass index (BMI) 29.0-29.9, adult: Secondary | ICD-10-CM | POA: Diagnosis not present

## 2023-08-31 DIAGNOSIS — Z955 Presence of coronary angioplasty implant and graft: Secondary | ICD-10-CM | POA: Insufficient documentation

## 2023-08-31 DIAGNOSIS — G4733 Obstructive sleep apnea (adult) (pediatric): Secondary | ICD-10-CM | POA: Diagnosis not present

## 2023-08-31 DIAGNOSIS — I50810 Right heart failure, unspecified: Secondary | ICD-10-CM | POA: Insufficient documentation

## 2023-08-31 DIAGNOSIS — N183 Chronic kidney disease, stage 3 unspecified: Secondary | ICD-10-CM | POA: Diagnosis not present

## 2023-08-31 DIAGNOSIS — I129 Hypertensive chronic kidney disease with stage 1 through stage 4 chronic kidney disease, or unspecified chronic kidney disease: Secondary | ICD-10-CM | POA: Diagnosis not present

## 2023-08-31 DIAGNOSIS — I2721 Secondary pulmonary arterial hypertension: Secondary | ICD-10-CM | POA: Diagnosis present

## 2023-08-31 DIAGNOSIS — I252 Old myocardial infarction: Secondary | ICD-10-CM | POA: Insufficient documentation

## 2023-08-31 DIAGNOSIS — R0602 Shortness of breath: Secondary | ICD-10-CM | POA: Diagnosis not present

## 2023-08-31 DIAGNOSIS — I272 Pulmonary hypertension, unspecified: Secondary | ICD-10-CM

## 2023-08-31 LAB — BASIC METABOLIC PANEL WITH GFR
Anion gap: 12 (ref 5–15)
BUN: 26 mg/dL — ABNORMAL HIGH (ref 8–23)
CO2: 25 mmol/L (ref 22–32)
Calcium: 9.2 mg/dL (ref 8.9–10.3)
Chloride: 100 mmol/L (ref 98–111)
Creatinine, Ser: 1.79 mg/dL — ABNORMAL HIGH (ref 0.44–1.00)
GFR, Estimated: 30 mL/min — ABNORMAL LOW (ref >60)
Glucose, Bld: 90 mg/dL (ref 70–99)
Potassium: 4.1 mmol/L (ref 3.5–5.1)
Sodium: 137 mmol/L (ref 135–145)

## 2023-08-31 LAB — BRAIN NATRIURETIC PEPTIDE: B Natriuretic Peptide: 58.6 pg/mL (ref 0.0–100.0)

## 2023-08-31 NOTE — Patient Instructions (Signed)
 Medication Changes:  No Changes In Medications at this time.   Lab Work:  Labs done today, your results will be available in MyChart, we will contact you for abnormal readings.  Follow-Up in: 2 MONTHS AS SCHEDULED   At the [facility], you and your health needs are our priority. We have a designated team specialized in the treatment of Heart Failure. This Care Team includes your primary Heart Failure Specialized Cardiologist (physician), Advanced Practice Providers (APPs- Physician Assistants and Nurse Practitioners), and Pharmacist who all work together to provide you with the care you need, when you need it.   You may see any of the following providers on your designated Care Team at your next follow up:  Dr. Arvilla Meres Dr. Marca Ancona Dr. Dorthula Nettles Dr. Theresia Bough Tonye Becket, NP Robbie Lis, [place] [facility] [place], [place] Brynda Peon, NP [place] Lee, NP Karle Plumber, PharmD   Please be sure to bring in all your medications bottles to every appointment.   Need to Contact us:  If you have any questions or concerns before your next appointment please send [place] a message through [place] or call our office at [phone].    TO LEAVE A MESSAGE FOR THE NURSE SELECT OPTION 2, PLEASE LEAVE A MESSAGE INCLUDING: YOUR NAME DATE OF BIRTH CALL BACK NUMBER REASON FOR CALL**this is important as we prioritize the call backs  YOU WILL RECEIVE A CALL BACK THE SAME DAY AS LONG AS YOU CALL BEFORE 4:00 PM

## 2023-09-01 ENCOUNTER — Other Ambulatory Visit (HOSPITAL_COMMUNITY): Payer: Self-pay | Admitting: Cardiology

## 2023-09-04 ENCOUNTER — Telehealth: Payer: Self-pay | Admitting: *Deleted

## 2023-09-04 NOTE — Telephone Encounter (Signed)
 Notified, copy to pcp.

## 2023-09-04 NOTE — Telephone Encounter (Signed)
-----   Message from Armida Lander sent at 09/03/2023 10:33 AM EDT ----- Carotid US  shows mild blockage on the right and moderate on the left. We will continue to monitor at this time  Letta Raw MD

## 2023-09-12 ENCOUNTER — Other Ambulatory Visit (HOSPITAL_COMMUNITY): Payer: Self-pay

## 2023-10-06 ENCOUNTER — Other Ambulatory Visit (HOSPITAL_COMMUNITY): Payer: Self-pay | Admitting: Family Medicine

## 2023-10-09 ENCOUNTER — Other Ambulatory Visit: Payer: Self-pay | Admitting: Nurse Practitioner

## 2023-10-12 ENCOUNTER — Other Ambulatory Visit (HOSPITAL_COMMUNITY): Payer: Self-pay | Admitting: Cardiology

## 2023-10-12 MED ORDER — POTASSIUM CHLORIDE ER 10 MEQ PO TBCR: 20.0000 meq | EXTENDED_RELEASE_TABLET | Freq: Every day | ORAL | 6 refills | Status: DC

## 2023-10-22 ENCOUNTER — Ambulatory Visit: Admitting: Primary Care

## 2023-10-22 ENCOUNTER — Encounter: Payer: Self-pay | Admitting: Primary Care

## 2023-10-22 VITALS — BP 129/52 | HR 70 | Ht 66.0 in | Wt 176.8 lb

## 2023-10-22 DIAGNOSIS — Z9989 Dependence on other enabling machines and devices: Secondary | ICD-10-CM

## 2023-10-22 DIAGNOSIS — I272 Pulmonary hypertension, unspecified: Secondary | ICD-10-CM

## 2023-10-22 DIAGNOSIS — G4733 Obstructive sleep apnea (adult) (pediatric): Secondary | ICD-10-CM | POA: Diagnosis not present

## 2023-10-22 DIAGNOSIS — J9611 Chronic respiratory failure with hypoxia: Secondary | ICD-10-CM

## 2023-10-22 NOTE — Progress Notes (Signed)
 @Patient  ID: Sandra Schneider, female    DOB: 14-Jan-1956, 68 y.o.   MRN: [id number]  Chief Complaint  Patient presents with   Sleep Apnea   Follow-up    Referring provider: Joanne Muckle, FNP  HPI: 68 year old female, never smoked. PMH significant for CHF, HTN, afib, NSTEMI, OSA on CPAP, chronic respiratory failure with hypoxia, type 2 diabetes, osteopenia, stage 4 CKD, breast cancer, rectal mass, hyperlipidemia, solitary pulmonary nodule.  10/22/2023 Discussed the use of AI scribe software for clinical note transcription with the patient, who gave verbal consent to proceed.  History of Present Illness   Sandra Schneider is a 68 year old female with severe sleep apnea who presents for follow-up.  She uses a CPAP machine with pressure settings between 10 to 15 cm H2O every night for an average of 7 hours and 9 minutes, achieving an apnea-hypopnea index (AHI) of 1.4 events per hour. She also uses 4L supplemental oxygen  at night with her CPAP.  During the day, she occasionally uses oxygen  as indicated by a previous walk test. She has a home oxygen  tank but has not yet acquired a portable oxygen  concentrator. Her oxygen  saturation levels typically range from 94% to 98%.  She has a history of pulmonary hypertension and is currently on medications including Uptravi , sildenafil , and another medication referred to as OpSummit. She follows up with a cardiologist for this condition.  Recently, she experienced a fall at [facility] while exercising, resulting in some discomfort. She had been taking tramadol for leg pain, prescribed by her family doctor, and continues to use it as needed.  She participates in water aerobics and exercises regularly at the [facility]. No new symptoms related to her pulmonary condition.      Airview download 09/21/23-10/20/23 Usage days 29/30 days (97%) Average usage 6 hour 55 mins Pressure 10-15cm h20 Airleaks 12.8L/min AHI 1.4   No Known  Allergies  Immunization History  Administered Date(s) Administered   Fluad Quad(high Dose 65+) 03/11/2021, 03/20/2022   Fluad Trivalent(High Dose 65+) 02/20/2023   Influenza-Unspecified 03/18/1996   Moderna Covid-19 Fall Seasonal Vaccine 52yrs & older 03/01/2023   Moderna Sars-Covid-2 Vaccination 07/24/2019, 08/23/2019, 09/22/2019, 04/21/2020, 09/29/2020   PNEUMOCOCCAL CONJUGATE-20 05/23/2023   Pneumococcal Polysaccharide-23 06/24/2012   Td 08/19/1990   Tdap 02/08/2016    Past Medical History:  Diagnosis Date   Breast cancer (HCC)    Coronary artery disease    Diabetes mellitus    GERD (gastroesophageal reflux disease)    Hypertension    Myocardial abscess    Myocardial infarct (HCC) 06/24/2012   Pulmonary hypertension (HCC)     Tobacco History: Social History   Tobacco Use  Smoking Status Never   Passive exposure: Never  Smokeless Tobacco Never   Counseling given: Not Answered   Outpatient Medications Prior to Visit  Medication Sig Dispense Refill   acetaminophen  (TYLENOL ) 325 MG tablet Take 650 mg by mouth every 6 (six) hours as needed for moderate pain.     amiodarone  (PACERONE ) 200 MG tablet Take 1 tablet (200 mg total) by mouth daily. 30 tablet 5   amLODipine  (NORVASC ) 5 MG tablet Take 1 tablet by mouth once daily 90 tablet 3   apixaban  (ELIQUIS ) 5 MG TABS tablet Take 1 tablet by mouth twice daily 180 tablet 1   Ascorbic Acid (VITAMIN C PO) Take 1 tablet by mouth daily.     atorvastatin  (LIPITOR ) 40 MG tablet Take 1 tablet by mouth once daily 90 tablet  3   Blood Pressure Monitor DEVI 1 each by Does not apply route daily. 1 each 0   Carboxymethylcellul-Glycerin (REFRESH OPTIVE) 1-0.9 % GEL Place 1 drop into both eyes as needed (dry eyes).     cetirizine (ZYRTEC) 10 MG tablet Take 10 mg by mouth daily.     Cholecalciferol (VITAMIN D) 50 MCG (2000 UT) tablet Take 2,000 Units by mouth daily.     famotidine  (PEPCID ) 20 MG tablet Take 20 mg by mouth daily.      letrozole (FEMARA) 2.5 MG tablet Take 2.5 mg by mouth daily.     macitentan (OPSUMIT) 10 MG tablet Take 10 mg by mouth daily.     Multiple Vitamin (MULTIVITAMIN) tablet Take 1 tablet by mouth daily.     OXYGEN  Inhale 4 L into the lungs continuous.     Polyethyl Glycol-Propyl Glycol (SYSTANE) 0.4-0.3 % SOLN Place 1 drop into both eyes as needed (dry eyes).     potassium chloride  (KLOR-CON ) 10 MEQ tablet Take 2 tablets (20 mEq total) by mouth daily. 60 tablet 6   prednisoLONE acetate (PRED FORTE) 1 % ophthalmic suspension Place 1 drop into both eyes in the morning and at bedtime.     sildenafil  (REVATIO ) 20 MG tablet TAKE 2 TABLETS BY MOUTH THREE TIMES DAILY 180 tablet 3   tirzepatide  (MOUNJARO ) 15 MG/0.5ML Pen Inject 15 mg into the skin once a week. (Patient taking differently: Inject 15 mg into the skin once a week. Takes on Wednesdays) 2 mL 5   torsemide  (DEMADEX ) 20 MG tablet Take 2 tablets (40 mg total) by mouth 2 (two) times daily. 120 tablet 6   traMADol (ULTRAM) 50 MG tablet Take 50 mg by mouth 4 (four) times daily as needed.     UPTRAVI  1600 MCG TABS Take 1,600 mcg by mouth daily.     No facility-administered medications prior to visit.   Review of Systems  Review of Systems  Constitutional: Negative.   HENT: Negative.    Respiratory: Negative.     Physical Exam  BP (!) 129/52 (BP Location: Left Arm)   Pulse 70   Ht 5\' 6"  (1.676 m)   Wt 176 lb 12.8 oz (80.2 kg)   LMP 01/14/2011   SpO2 100% Comment: RA  BMI 28.54 kg/m  Physical Exam Constitutional:      General: She is not in acute distress.    Appearance: Normal appearance. She is not ill-appearing.  HENT:     Head: Normocephalic and atraumatic.  Cardiovascular:     Rate and Rhythm: Normal rate and regular rhythm.  Pulmonary:     Effort: Pulmonary effort is normal.     Breath sounds: Normal breath sounds.  Musculoskeletal:        General: Normal range of motion.  Skin:    General: Skin is warm and dry.   Neurological:     General: No focal deficit present.     Mental Status: She is alert and oriented to person, place, and time. Mental status is at baseline.  Psychiatric:        Mood and Affect: Mood normal.        Behavior: Behavior normal.        Thought Content: Thought content normal.        Judgment: Judgment normal.      Lab Results:  CBC    Component Value Date/Time   WBC 6.5 12/22/2022 0930   RBC 3.02 (L) 12/22/2022 0930   HGB 10.9 (L)  01/05/2023 1000   HGB 12.4 09/20/2022 1105   HCT 32.0 (L) 01/05/2023 1000   HCT 38.0 09/20/2022 1105   PLT 236 12/22/2022 0930   PLT 213 09/20/2022 1105   MCV 99.7 12/22/2022 0930   MCV 96 09/20/2022 1105   MCH 32.8 12/22/2022 0930   MCHC 32.9 12/22/2022 0930   RDW 14.4 12/22/2022 0930   RDW 13.9 09/20/2022 1105   LYMPHSABS 0.8 09/20/2022 1105   MONOABS 0.7 09/08/2022 0302   EOSABS 0.0 09/20/2022 1105   BASOSABS 0.0 09/20/2022 1105    BMET    Component Value Date/Time   NA 137 08/31/2023 1103   NA 139 09/20/2022 1105   K 4.1 08/31/2023 1103   CL 100 08/31/2023 1103   CO2 25 08/31/2023 1103   GLUCOSE 90 08/31/2023 1103   BUN 26 (H) 08/31/2023 1103   BUN 55 (H) 09/20/2022 1105   CREATININE 1.79 (H) 08/31/2023 1103   CREATININE 1.00 07/03/2012 1700   CALCIUM  9.2 08/31/2023 1103   GFRNONAA 30 (L) 08/31/2023 1103   GFRAA >60 07/18/2019 1008    BNP    Component Value Date/Time   BNP 58.6 08/31/2023 1103    ProBNP    Component Value Date/Time   PROBNP 173.3 (H) 06/23/2012 2031    Imaging: No results found.   Assessment & Plan:   1. OSA on CPAP (Primary) - Pulse oximetry, overnight; Future - Ambulatory Referral for DME  2. Chronic respiratory failure with hypoxia (HCC) - Pulse oximetry, overnight; Future - Ambulatory Referral for DME   Assessment and Plan    Severe sleep apnea Severe sleep apnea is well-controlled with CPAP therapy. She uses CPAP nightly for an average of 7 hours and 9 minutes, with  an apnea-hypopnea index (AHI) of 1.4, indicating effective management. The CPAP pressure settings are set between 10 to 15 cm H2O. - Continue CPAP therapy nightly with current settings of 10 to 15 cm H2O. - Order overnight oximetry test to assess oxygen  levels on 2 liters of oxygen  while using CPAP.  Pulmonary hypertension Pulmonary hypertension is managed with Uptravi , sildenafil , and OpSummit. She follows up with a cardiologist for this condition. Blood pressure is currently 129/52 mmHg. She uses oxygen  therapy occasionally during the day and is considering a portable oxygen  concentrator for convenience. There is a plan to assess if her oxygen  levels can be reduced from 4 liters to 2 liters during the overnight oximetry test. - Ordered for best fit eval with DME to qualify for a portable oxygen  concentrator. - Instruct her to use 2 liters of oxygen  during the overnight oximetry test to evaluate if current oxygen  levels can be reduced.  Antonio Baumgarten, NP 10/22/2023

## 2023-10-22 NOTE — Patient Instructions (Signed)
  YOUR PLAN: -SEVERE SLEEP APNEA: Severe sleep apnea is a condition where your breathing repeatedly stops and starts during sleep. Your condition is well-controlled with your CPAP machine, which you use nightly. Continue using your CPAP with the current settings of 10 to 15 cm H2O. We will also conduct an overnight oximetry test to check your oxygen  levels while using 2 liters of oxygen  with your CPAP.  -PULMONARY HYPERTENSION: Pulmonary hypertension is high blood pressure in the arteries of your lungs. You are managing this condition with medications including Uptravi , sildenafil , and OpSummit, and you follow up with your cardiologist. Your blood pressure is currently 129/52 mmHg. We will perform a walk test to assess your oxygen  needs and see if you qualify for a portable oxygen  concentrator. Please use 2 liters of oxygen  during the overnight oximetry test to evaluate if your current oxygen  levels can be reduced.  INSTRUCTIONS: Continue using your CPAP machine nightly with the current settings. We will conduct an overnight oximetry test to assess your oxygen  levels on 2 liters of oxygen  while using CPAP. Additionally, we will perform a walk test to determine your need for a portable oxygen  concentrator. Follow up with your cardiologist as scheduled.  Orders ONO on 2L with CPAP (ordered) Best fit for POC with [employer]  Follow-up 6 months with [facility] NP or sooner if needed   Contains text generated by [employer]bridge.

## 2023-10-31 NOTE — Progress Notes (Signed)
 4r  ADVANCED HEART FAILURE CLINIC NOTE  Referring Physician: Joanne Muckle, FNP  Primary Care: [facility] Primary Cardiologist: Dr. Armida Lander  CC: Pulmonary Hypertension  HPI: Sandra Schneider is a 68 y.o. female with CAD (NSTEMI 06/2012 w/ PCI to LAD and Lcx) LVEF 55% at that time, OSA, HTN, hyperlipidemia, hx of breast ca s/p lumpectomy and persistent shortness of breath and lower extremity edema presenting today for follow up. She also follows Dr. Alva for OSA.  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  She has noticed that over the past several months she has had worsening lower extremity edema and difficulty ambulating due to shortness of breath.  She had a echocardiogram concerning for pulmonary hypertension followed by right heart cath with a mean PA of 69 mmHg.  Her cardiac output was 7 L/min during that case.  She had a workup for interstitial lung disease by pulmonology with CT chest that was negative.  However there was a right upper lobe lung nodule with negative PET scan.  In addition she has had a colonoscopy and mammogram due to hypermetabolic areas in the colon and breast both of which were also negative.   Since her initial appointment we started her on sildenafil  20 mg 3 times daily.  Unfortunately due to continued dietary indiscretion, significant volume intake and medication noncompliance she presented severely hypervolemic during her last appointment in May 2024.  She was directly admitted to the hospital and diuresed 35 pounds during that admission.  Right heart cath confirmed severe precapillary pulmonary hypertension.  Sildenafil  was increased to 40 mg 3 times daily.  Interval hx:  - She is now on uptravi , sildenafil  and opsumit.  -Continues to do very well; shortness of breath is at baseline. She wears 4L [place] throughout the day. Reports that she sometimes doesn't wear it through the day without significant  dyspnea. O2 sats drop to 93% at the lowerst.     Current Outpatient Medications  Medication Sig Dispense Refill   acetaminophen  (TYLENOL ) 325 MG tablet Take 650 mg by mouth every 6 (six) hours as needed for moderate pain.     amiodarone  (PACERONE ) 200 MG tablet Take 1 tablet (200 mg total) by mouth daily. 30 tablet 5   amLODipine  (NORVASC ) 5 MG tablet Take 1 tablet by mouth once daily 90 tablet 3   apixaban  (ELIQUIS ) 5 MG TABS tablet Take 1 tablet by mouth twice daily 180 tablet 1   Ascorbic Acid (VITAMIN C PO) Take 1 tablet by mouth daily.     atorvastatin  (LIPITOR ) 40 MG tablet Take 1 tablet by mouth once daily 90 tablet 3   Blood Pressure Monitor DEVI 1 each by Does not apply route daily. 1 each 0   Carboxymethylcellul-Glycerin (REFRESH OPTIVE) 1-0.9 % GEL Place 1 drop into both eyes as needed (dry eyes).     cetirizine (ZYRTEC) 10 MG tablet Take 10 mg by mouth daily.     Cholecalciferol (VITAMIN D) 50 MCG (2000 UT) tablet Take 2,000 Units by mouth daily.     famotidine  (PEPCID ) 20 MG tablet Take 20 mg by mouth daily.     letrozole (FEMARA) 2.5 MG tablet Take 2.5 mg by mouth daily.     macitentan (OPSUMIT) 10 MG tablet Take 10 mg by mouth daily.     Multiple Vitamin (MULTIVITAMIN) tablet Take 1 tablet by mouth daily.     OXYGEN  Inhale 4 L into the lungs continuous.  Polyethyl Glycol-Propyl Glycol (SYSTANE) 0.4-0.3 % SOLN Place 1 drop into both eyes as needed (dry eyes).     potassium chloride  (KLOR-CON ) 10 MEQ tablet Take 2 tablets (20 mEq total) by mouth daily. 60 tablet 6   prednisoLONE acetate (PRED FORTE) 1 % ophthalmic suspension Place 1 drop into both eyes in the morning and at bedtime.     sildenafil  (REVATIO ) 20 MG tablet TAKE 2 TABLETS BY MOUTH THREE TIMES DAILY 180 tablet 3   tirzepatide  (MOUNJARO ) 15 MG/0.5ML Pen Inject 15 mg into the skin once a week. (Patient taking differently: Inject 15 mg into the skin once a week. Takes on Wednesdays) 2 mL 5   torsemide  (DEMADEX ) 20  MG tablet Take 2 tablets (40 mg total) by mouth 2 (two) times daily. 120 tablet 6   traMADol (ULTRAM) 50 MG tablet Take 50 mg by mouth 4 (four) times daily as needed.     UPTRAVI  1600 MCG TABS Take 1,600 mcg by mouth daily.     No current facility-administered medications for this visit.    No Known Allergies  PHYSICAL EXAM: There were no vitals filed for this visit. GENERAL: NAD Lungs- *** CARDIAC:  JVP: *** cm          Normal rate with regular rhythm. *** murmur.  Pulses ***. *** edema.  ABDOMEN: Soft, non-tender, non-distended.  EXTREMITIES: Warm and well perfused.  NEUROLOGIC: No obvious FND    DATA REVIEW  ECG:06/13/22: NSR with 1AVB  ECHO: 01/18/22: LVBEF 60-65%. Moderately dilated RV with mild reduction in function. Dilated RA.  CATH: 02/08/23:   Prox Cx to Dist Cx lesion is 25% stenosed.   Mid LAD lesion is 25% stenosed.   2nd Mrg lesion is 100% stenosed.  Right to left collaterals.  Left to left collaterals.   The left ventricular systolic function is normal.   LV end diastolic pressure is mildly elevated.   The left ventricular ejection fraction is greater than 65% by visual estimate.   Hemodynamic findings consistent with severe pulmonary hypertension.   There is no aortic valve stenosis.   Aortic saturation 96% on 3 L nasal cannula, PA saturation 69% on 3 L, mean RA pressure 32 mm Hg; PA pressure 107/48, mean PA pressure 69 mmHg, pulmonary capillary wedge pressure difficult to assess due to significant V waves, and difficulty wedging.  Cardiac output 7.03 L/min, cardiac index 3.07.   Patent stents with mild restenosis in the LAD and circumflex.  Small obtuse marginal appears occluded distally with some right to left collaterals.  No target for PCI.  RHC 09/28/22: RA 32 RV 95/32 PA 93/44, mean 66 PCWP mean 19  Oxygen  saturations: PA 49% AO 96%  Cardiac Output (Fick) 3.98  Cardiac Index (Fick) 1.73 PVR 11.8 PAPi 1.5   10/31/22:  HEMODYNAMICS: RA:                   10 mmHg (mean) RV:                  104/3-10 mmHg PA:                  100/27 mmHg (57 mean) PCWP:            9 mmHg (mean)                                      Estimated Fick CO/CI  6.7 L/min, 3.15 L/min/m2                                            TPG                 48  mmHg                                            PVR                 7 Wood Units  PAPi                10.4    PFTS: 03/21/22: 03/2022 moderate restriction, ratio 84, FEV1 65%, FVC 59%, TLC 74%, DLCO 13.6/64%.  ERV 8% with wt 275   12/22/22: :  Pt ambulated 763ft (228.60m) O2 Sat ranged 100-95% on 4L oxygen  HR ranged 71-88  07/03/23: Pt ambulated 1100 ft (335.28 m) O2 Sat ranged 99-100 on 2 L oxygen  HR ranged 65-71   ASSESSMENT & PLAN:  Pulmonary hypertension, Group I + III - Initial RHC waveforms reviewed personally. RA 30, RV 107/20-30, PA 107/48 (69), unable to wedge; LVEDP 15. AO 140/64. LVEDP ~16. Assuming a PCWP of 20, TPG would be 49 w/ PVR of 7.  - Hemodynamics consistent with severe pulmonary hypertension largely due to pre-capillary PH.  -Cardiac MRI in 5/24 with EF 75%, moderate RV dilation with RV EF 50%, moderate TR, mid-wall basal septal/basal inferolateral LGE. Could be seen with cardiac sarcoidosis (vs prior myocarditis). CT chest showed RUL nodule (negative on PET) and mediastinal LAN. V/Q scan not suggestive of chronic PEs. RHC 5/16 showed severe PAH with low CI 1.73, PAPi 1.5, RA pressure 32 (R>>L heart failure). TEE this admission with LV EF 60-65%, D-shaped septum, moderate RV dilation with moderate RV dysfunction, mod-severe TR. Likely end-stage PH/cor pulmonale d/t OHS/OSA and group 1 PAH.  - Labs: ANA, HCV negative. HIV pending. LFTs mostly unremarkable.  - PFTs w/ moderate restriction.  - CT chest w/ pulmonary nodules, however, follow up PET mostly unremarkable.  - Currently on sildenafil  40mg  TID, opsumit & uptravi .  - Participating in pulmonary rehab now.   - Excellent  ; she was previously on 4L O2 with prior walk distance of 229m; today on 2L O2 she walked 375m with no hypoxia. Will continue current triple therapy. Still waiting for pulmonology to decrease total O2.  - Repeat BMP/BNP today.   2. OSA - followed by pulmonology.  - Now using CPAP nightly - Followed by Dr. Villa Greaser; reviewed notes from 01/19/23 - Seen by Eulas Hick on 07/04/23; weaning night time O2 as able.  - Reached out to Eulas Hick today to wean O2 to 2L.   3. CAD - LHC as above - followed by general cardiology.  - no chest pain.   4. Severe obesity - There is no height or weight on file to calculate BMI.  - On M[place]oro; continuing to lose weight. Feels very well.   5. T2DM - Only taking mounjoro  - A1C now down to 5.3  - followed by PCP.   6. CKD IIIB - sCr 1.5 on 10/0/24 - repeat BMP today.   I spent 32 minutes caring for this patient today including face  to face time, ordering and reviewing labs, discussing, seeing the patient, documenting in the record, and arranging follow ups.   Sandra Schneider Advanced Heart Failure Mechanical Circulatory Support

## 2023-11-01 ENCOUNTER — Encounter (HOSPITAL_COMMUNITY): Payer: Self-pay | Admitting: Cardiology

## 2023-11-01 ENCOUNTER — Ambulatory Visit (HOSPITAL_COMMUNITY)
Admission: RE | Admit: 2023-11-01 | Discharge: 2023-11-01 | Disposition: A | Discharge status: Home / Self Care | Source: Ambulatory Visit | Attending: Cardiology | Admitting: Cardiology

## 2023-11-01 VITALS — BP 130/60 | HR 77 | Wt 170.8 lb

## 2023-11-01 DIAGNOSIS — I2510 Atherosclerotic heart disease of native coronary artery without angina pectoris: Secondary | ICD-10-CM | POA: Insufficient documentation

## 2023-11-01 DIAGNOSIS — N1832 Chronic kidney disease, stage 3b: Secondary | ICD-10-CM | POA: Insufficient documentation

## 2023-11-01 DIAGNOSIS — G4733 Obstructive sleep apnea (adult) (pediatric): Secondary | ICD-10-CM | POA: Diagnosis not present

## 2023-11-01 DIAGNOSIS — I130 Hypertensive heart and chronic kidney disease with heart failure and stage 1 through stage 4 chronic kidney disease, or unspecified chronic kidney disease: Secondary | ICD-10-CM | POA: Insufficient documentation

## 2023-11-01 DIAGNOSIS — E785 Hyperlipidemia, unspecified: Secondary | ICD-10-CM | POA: Insufficient documentation

## 2023-11-01 DIAGNOSIS — E1122 Type 2 diabetes mellitus with diabetic chronic kidney disease: Secondary | ICD-10-CM | POA: Diagnosis not present

## 2023-11-01 DIAGNOSIS — Z955 Presence of coronary angioplasty implant and graft: Secondary | ICD-10-CM | POA: Diagnosis not present

## 2023-11-01 DIAGNOSIS — Z853 Personal history of malignant neoplasm of breast: Secondary | ICD-10-CM | POA: Diagnosis not present

## 2023-11-01 DIAGNOSIS — I252 Old myocardial infarction: Secondary | ICD-10-CM | POA: Insufficient documentation

## 2023-11-01 DIAGNOSIS — Z7985 Long-term (current) use of injectable non-insulin antidiabetic drugs: Secondary | ICD-10-CM | POA: Diagnosis not present

## 2023-11-01 DIAGNOSIS — I50810 Right heart failure, unspecified: Secondary | ICD-10-CM

## 2023-11-01 DIAGNOSIS — I272 Pulmonary hypertension, unspecified: Secondary | ICD-10-CM | POA: Diagnosis not present

## 2023-11-01 DIAGNOSIS — J9611 Chronic respiratory failure with hypoxia: Secondary | ICD-10-CM | POA: Diagnosis not present

## 2023-11-01 LAB — BASIC METABOLIC PANEL WITH GFR
Anion gap: 11 (ref 5–15)
BUN: 23 mg/dL (ref 8–23)
CO2: 25 mmol/L (ref 22–32)
Calcium: 9.5 mg/dL (ref 8.9–10.3)
Chloride: 101 mmol/L (ref 98–111)
Creatinine, Ser: 1.72 mg/dL — ABNORMAL HIGH (ref 0.44–1.00)
GFR, Estimated: 32 mL/min — ABNORMAL LOW (ref >60)
Glucose, Bld: 90 mg/dL (ref 70–99)
Potassium: 4.6 mmol/L (ref 3.5–5.1)
Sodium: 137 mmol/L (ref 135–145)

## 2023-11-01 LAB — BRAIN NATRIURETIC PEPTIDE: B Natriuretic Peptide: 33.4 pg/mL (ref 0.0–100.0)

## 2023-11-01 MED ORDER — TORSEMIDE 20 MG PO TABS: ORAL_TABLET | ORAL | 6 refills | Status: AC

## 2023-11-01 NOTE — Patient Instructions (Signed)
 Medication Changes:  DECREASE TORSEMIDE  TO 40MG  IN THE MORNING AND 20MG  IN THE EVENING   Lab Work:  Labs done today, your results will be available in MyChart, we will contact you for abnormal readings.  Follow-Up in: 3 MONTHS AS SCHEDULED   At the [facility], you and your health needs are our priority. We have a designated team specialized in the treatment of Heart Failure. This Care Team includes your primary Heart Failure Specialized Cardiologist (physician), Advanced Practice Providers (APPs- Physician Assistants and Nurse Practitioners), and Pharmacist who all work together to provide you with the care you need, when you need it.   You may see any of the following providers on your designated Care Team at your next follow up:  Dr. Jules Oar Dr. Peder Bourdon Dr. Alwin Baars Dr. Judyth Nunnery Nieves Bars, NP Ruddy Corral, [place] [facility], [place] Dennise Fitz, NP [place] Lee, NP Luster Salters, PharmD   Please be sure to bring in all your medications bottles to every appointment.   Need to Contact Us :  If you have any questions or concerns before your next appointment please send us  a message through [place] or call our office at [phone].    TO LEAVE A MESSAGE FOR THE NURSE SELECT OPTION 2, PLEASE LEAVE A MESSAGE INCLUDING: YOUR NAME DATE OF BIRTH CALL BACK NUMBER REASON FOR CALL**this is important as we prioritize the call backs  YOU WILL RECEIVE A CALL BACK THE SAME DAY AS LONG AS YOU CALL BEFORE 4:00 PM

## 2023-11-08 ENCOUNTER — Telehealth (HOSPITAL_COMMUNITY): Payer: Self-pay | Admitting: Pharmacy Technician

## 2023-11-08 ENCOUNTER — Other Ambulatory Visit (HOSPITAL_COMMUNITY): Payer: Self-pay | Admitting: Cardiology

## 2023-11-08 MED ORDER — MACITENTAN 10 MG PO TABS: 10.0000 mg | ORAL_TABLET | Freq: Every day | ORAL | 3 refills | Status: AC

## 2023-11-08 NOTE — Telephone Encounter (Signed)
 Advanced Heart Failure Patient Advocate Encounter  Refill request for Opsumit was requested.Sent 90 day RX request to Chantel (CMA) to send to [facility].   Sandra JULIANNA Pa, CPhT

## 2023-11-13 ENCOUNTER — Other Ambulatory Visit (HOSPITAL_COMMUNITY): Payer: Self-pay | Admitting: Cardiology

## 2023-12-05 ENCOUNTER — Telehealth: Payer: Self-pay

## 2023-12-05 NOTE — Telephone Encounter (Signed)
 Received CMN from [employer] for PAP supplies.  Prepped forms and placed on Beth's desk in B POD.  Will await signature then fax to [employer] at [phone].  Faxed information to [employer]

## 2023-12-13 ENCOUNTER — Telehealth: Payer: Self-pay | Admitting: *Deleted

## 2023-12-13 NOTE — Telephone Encounter (Signed)
 CMN faxed to [facility], received fax confirmation that fax was sent successfully.  Placed in folder to be sent to scan.  Nothing further needed.

## 2023-12-26 ENCOUNTER — Other Ambulatory Visit (HOSPITAL_COMMUNITY): Payer: Self-pay | Admitting: Pharmacist

## 2023-12-26 MED ORDER — UPTRAVI 1600 MCG PO TABS: 1600.0000 ug | ORAL_TABLET | Freq: Two times a day (BID) | ORAL | 11 refills | Status: AC

## 2023-12-31 ENCOUNTER — Encounter (HOSPITAL_COMMUNITY): Payer: Self-pay | Admitting: Occupational Therapy

## 2023-12-31 ENCOUNTER — Ambulatory Visit (HOSPITAL_COMMUNITY): Attending: Family Medicine | Admitting: Occupational Therapy

## 2023-12-31 DIAGNOSIS — M25612 Stiffness of left shoulder, not elsewhere classified: Secondary | ICD-10-CM | POA: Diagnosis present

## 2023-12-31 DIAGNOSIS — R29898 Other symptoms and signs involving the musculoskeletal system: Secondary | ICD-10-CM | POA: Diagnosis present

## 2023-12-31 DIAGNOSIS — M25512 Pain in left shoulder: Secondary | ICD-10-CM | POA: Insufficient documentation

## 2023-12-31 DIAGNOSIS — M25611 Stiffness of right shoulder, not elsewhere classified: Secondary | ICD-10-CM | POA: Insufficient documentation

## 2023-12-31 DIAGNOSIS — M25511 Pain in right shoulder: Secondary | ICD-10-CM | POA: Diagnosis present

## 2023-12-31 NOTE — Therapy (Signed)
 OUTPATIENT OCCUPATIONAL THERAPY ORTHO EVALUATION  Patient Name: Sandra Schneider MRN: [id number] DOB:06-16-1955, 68 y.o., female Today's Date: 12/31/2023   END OF SESSION:  OT End of Session - 12/31/23 1522     Visit Number 1    Number of Visits 8    Date for OT Re-Evaluation 02/01/24    Authorization Type U[employer] Medicare (requesting 8 visits)    OT Start Time [phone]    OT Stop Time 1010    OT Time Calculation (min) 32 min    Activity Tolerance Patient tolerated treatment well    Behavior During Therapy [facility] for tasks assessed/performed          Past Medical History:  Diagnosis Date   Breast cancer (HCC)    Coronary artery disease    Diabetes mellitus    GERD (gastroesophageal reflux disease)    Hypertension    Myocardial abscess    Myocardial infarct (HCC) 06/24/2012   Pulmonary hypertension (HCC)    Past Surgical History:  Procedure Laterality Date   ANTERIOR VITRECTOMY Left 07/21/2019   Procedure: ANTERIOR VITRECTOMY;  Surgeon: Harrie Agent, MD;  Location: [facility];  Service: Ophthalmology;  Laterality: Left;   ANTERIOR VITRECTOMY Right 08/04/2019   Procedure: ANTERIOR VITRECTOMY;  Surgeon: Harrie Agent, MD;  Location: [facility];  Service: Ophthalmology;  Laterality: Right;   CARDIAC CATHETERIZATION     CARDIOVERSION N/A 09/29/2022   Procedure: CARDIOVERSION;  Surgeon: Rolan Ezra RAMAN, MD;  Location: [facility] INVASIVE CV LAB;  Service: Cardiovascular;  Laterality: N/A;   CARDIOVERSION N/A 10/05/2022   Procedure: CARDIOVERSION;  Surgeon: Cherrie Toribio SAUNDERS, MD;  Location: [facility] INVASIVE CV LAB;  Service: Cardiovascular;  Laterality: N/A;   CATARACT EXTRACTION W/PHACO Left 07/21/2019   Procedure: CATARACT EXTRACTION PHACO AND INTRAOCULAR LENS PLACEMENT (IOC) (CDE: 11.45);  Surgeon: Harrie Agent, MD;  Location: [facility];  Service: Ophthalmology;  Laterality: Left;   CATARACT EXTRACTION W/PHACO Right 08/04/2019   Procedure: CATARACT EXTRACTION PHACO AND INTRAOCULAR LENS PLACEMENT  (IOC);  Surgeon: Harrie Agent, MD;  Location: [facility];  Service: Ophthalmology;  Laterality: Right;  CDE: 9.78   CESAREAN SECTION     COLONOSCOPY WITH PROPOFOL  N/A 05/22/2022   Procedure: COLONOSCOPY WITH PROPOFOL ;  Surgeon: Cindie Carlin POUR, DO;  Location: [facility];  Service: Endoscopy;  Laterality: N/A;  10:00 am   CORONARY STENT PLACEMENT N/A 06/24/2012   LEFT HEART CATHETERIZATION WITH CORONARY ANGIOGRAM N/A 06/24/2012   Procedure: LEFT HEART CATHETERIZATION WITH CORONARY ANGIOGRAM;  Surgeon: Toribio SAUNDERS Cherrie, MD;  Location: [facility] CATH LAB;  Service: Cardiovascular;  Laterality: N/A;   right breast lumpectomy     RIGHT HEART CATH N/A 09/28/2022   Procedure: RIGHT HEART CATH;  Surgeon: Rolan Ezra RAMAN, MD;  Location: [facility] INVASIVE CV LAB;  Service: Cardiovascular;  Laterality: N/A;   RIGHT HEART CATH N/A 10/31/2022   Procedure: RIGHT HEART CATH;  Surgeon: Gardenia Led, DO;  Location: [facility] INVASIVE CV LAB;  Service: Cardiovascular;  Laterality: N/A;   RIGHT HEART CATH N/A 01/05/2023   Procedure: RIGHT HEART CATH;  Surgeon: Gardenia Led, DO;  Location: [facility] INVASIVE CV LAB;  Service: Cardiovascular;  Laterality: N/A;   RIGHT/LEFT HEART CATH AND CORONARY ANGIOGRAPHY N/A 02/07/2022   Procedure: RIGHT/LEFT HEART CATH AND CORONARY ANGIOGRAPHY;  Surgeon: Dann Candyce RAMAN, MD;  Location: [facility] INVASIVE CV LAB;  Service: Cardiovascular;  Laterality: N/A;   TEE WITHOUT CARDIOVERSION N/A 09/29/2022   Procedure: TRANSESOPHAGEAL ECHOCARDIOGRAM;  Surgeon: Rolan Ezra RAMAN, MD;  Location: [facility] INVASIVE CV  LAB;  Service: Cardiovascular;  Laterality: N/A;   Patient Active Problem List   Diagnosis Date Noted   Diabetic peripheral neuropathy (HCC) 02/12/2023   Lymphedema of lower extremity 02/12/2023   Chronic respiratory failure with hypoxia (HCC) 01/19/2023   RVF (right ventricular failure) (HCC) 09/28/2022   Persistent atrial fibrillation (HCC) 09/26/2022   Glaucoma (increased eye pressure)  09/26/2022   OSA on CPAP 09/20/2022   Stage 4 chronic kidney disease (HCC) 09/09/2022   Acute on chronic diastolic CHF (congestive heart failure) (HCC) 09/08/2022   Hypoglycemia 09/07/2022   Pulmonary hypertension (HCC) 06/05/2022   Solitary pulmonary nodule on lung CT 06/05/2022   Rectal mass 05/16/2022   DOE (dyspnea on exertion)    Urticaria 10/29/2020   Adenomatous polyp of cecum 09/07/2020   History of radiation therapy 06/22/2020   Aromatase inhibitor use 02/04/2020   Osteopenia of multiple sites 02/04/2020   Breast cancer (HCC) 12/12/2019   Pain due to onychomycosis of toenails of both feet 12/12/2019   Pincer nail deformity 12/12/2019   Malignant neoplasm of upper-outer quadrant of right breast in female, estrogen receptor positive (HCC) 11/12/2019   Non-ST elevation (NSTEMI) myocardial infarction (HCC) 06/25/2012   Atherosclerotic heart disease of native coronary artery without angina pectoris 06/25/2012   Essential (primary) hypertension 06/25/2012   Type 2 diabetes mellitus without complications (HCC) 06/25/2012   Hyperlipidemia 06/25/2012   Normocytic anemia 06/25/2012   Class 3 obesity 06/25/2012   Type 2 diabetes mellitus with hyperglycemia (HCC) 06/25/2012    PCP: Teresa Jenkins Jansky, FNP REFERRING PROVIDER: Teresa Jenkins Jansky, FNP  ONSET DATE: ~6 months  REFERRING DIAG: M25.512 (ICD-10-CM) - Pain in left shoulder   THERAPY DIAG:  Right shoulder pain, unspecified chronicity  Shoulder stiffness, right  Other symptoms and signs involving the musculoskeletal system  Rationale for Evaluation and Treatment: Rehabilitation  SUBJECTIVE:   SUBJECTIVE STATEMENT: It doesn't really hurt, it just doesn't move right. Pt accompanied by: self  PERTINENT HISTORY: No PMH.  PRECAUTIONS: None  WEIGHT BEARING RESTRICTIONS: No  PAIN:  Are you having pain? No- pulling sensation with movements  FALLS: Has patient fallen in last 6 months? Yes. Number of falls 1  PLOF:  Independent  PATIENT GOALS: Improve mobility  NEXT MD VISIT: None Scheduled  OBJECTIVE:   HAND DOMINANCE: Right  ADLs: Overall ADLs: Pt unable to reach overhead or behind back, limiting dressing and bathing. Pain with attempting to lift anything with weight to it.   FUNCTIONAL OUTCOME MEASURES: Quick Dash: 40.91  UPPER EXTREMITY ROM:       Assessed in seated, er/IR adducted  Active ROM Left eval  Shoulder flexion 139  Shoulder abduction 127  Shoulder internal rotation 90  Shoulder external rotation 12  (Blank rows = not tested)    UPPER EXTREMITY MMT:     Assessed in seated, er/IR adducted  MMT Left eval  Shoulder flexion 4/5  Shoulder abduction 4-/5  Shoulder internal rotation 4-/5  Shoulder external rotation 3+/5  (Blank rows = not tested)  SENSATION: WFL  EDEMA: No swelling noted  OBSERVATIONS: Moderate fascial restrictions along biceps, scapula, trapezius   TODAY'S TREATMENT:  DATE:   12/31/23 -A/ROM: seated, flexion, abduction, protraction, horizontal abduction, er/IR, x10    PATIENT EDUCATION: Education details: A/ROM Person educated: Patient Education method: Programmer, multimedia, Facilities manager, and Handouts Education comprehension: verbalized understanding and returned demonstration  HOME EXERCISE PROGRAM: 8/18: A/ROM  GOALS: Goals reviewed with patient? Yes   SHORT TERM GOALS: Target date: 02/01/24  Pt will be provided with and educated on HEP to improve mobility in RUE required for use during ADL completion.   Goal status: INITIAL  LONG TERM GOALS: Target date: 02/01/24  Pt will decrease pain in RUE to 3/10 or less to improve ability to sleep for 2+ consecutive hours without waking due to pain.   Goal status: INITIAL  2.  Pt will decrease RUE fascial restrictions to min amounts or less to improve mobility required  for functional reaching tasks.   Goal status: INITIAL  3.  Pt will increase RUE A/ROM by 20 degrees to improve ability to use RUE when reaching overhead or behind back during dressing and bathing tasks.   Goal status: INITIAL  4.  Pt will increase RUE strength to 5/5 or greater to improve ability to use RUE when lifting or carrying items during meal preparation/housework/yardwork tasks.   Goal status: INITIAL  5.  Pt will return to highest level of function using RUE as non-dominant during functional task completion.   Goal status: INITIAL   ASSESSMENT:  CLINICAL IMPRESSION: Patient is a 68 y.o. female who was seen today for occupational therapy evaluation for R shoulder pain. Pt presents with increased pain and fascial restrictions, decreased ROM, strength, and functional use of the RUE.   PERFORMANCE DEFICITS: in functional skills including in functional skills including ADLs, IADLs, coordination, tone, ROM, strength, pain, fascial restrictions, muscle spasms, and UE functional use.  IMPAIRMENTS: are limiting patient from ADLs, IADLs, rest and sleep, work, leisure, and social participation.   COMORBIDITIES: has no other co-morbidities that affects occupational performance. Patient will benefit from skilled OT to address above impairments and improve overall function.  MODIFICATION OR ASSISTANCE TO COMPLETE EVALUATION: Min-Moderate modification of tasks or assist with assess necessary to complete an evaluation.  OT OCCUPATIONAL PROFILE AND HISTORY: Problem focused assessment: Including review of records relating to presenting problem.  CLINICAL DECISION MAKING: Moderate - several treatment options, min-mod task modification necessary  REHAB POTENTIAL: Good  EVALUATION COMPLEXITY: Low      PLAN:  OT FREQUENCY: 2x/week  OT DURATION: 4 weeks  PLANNED INTERVENTIONS: 97168 OT Re-evaluation, 97535 self care/ADL training, 02889 therapeutic exercise, 97530 therapeutic  activity, 97112 neuromuscular re-education, 97140 manual therapy, 97035 ultrasound, 97010 moist heat, 97032 electrical stimulation (manual), passive range of motion, functional mobility training, energy conservation, coping strategies training, patient/family education, and DME and/or AE instructions  RECOMMENDED OTHER SERVICES: N/A  CONSULTED AND AGREED WITH PLAN OF CARE: Patient  PLAN FOR NEXT SESSION: Manual Therapy, A/ROM, stretching, strengthening   Valentin Thelbert [facility] [phone] Levie Owensby Jillyn Thelbert, OT 12/31/2023, 3:25 PM  [employer] Medicare Auth Request Information Treatment Start Date: 12/31/23  Date of referral: 12/17/23 Referring provider: Jenkins Earnie Pizza, FNP Referring diagnosis (ICD 10)? M25.512 (ICD-10-CM) - Pain in left shoulder  Treatment diagnosis (ICD 10)? (if different than referring diagnosis) M25.512, M25.612, R29.898  What was this (referring dx) caused by? Unspecified  Nature of Condition: Initial Onset (within last 3 months)   Laterality: Lt  Current Functional Measure Score: DASH 40.91  Objective measurements identify impairments when they are compared to normal values, the  uninvolved extremity, and prior level of function.  [x]  Yes  []  No  Objective assessment of functional ability: Moderate functional limitations   Briefly describe symptoms: Pt having pain in the shoulder with mild decrease of ROM and moderate decrease in strength and endurance.   How did symptoms start: Pt reports pain and pulling sensation started immediately with no fall or known injury preceding it.   Average pain intensity:  Last 24 hours: 0/10  Past week: 4/10  How often does the pt experience symptoms? Frequently  How much have the symptoms interfered with usual daily activities? Moderately  How has condition changed since care began at this facility? NA - initial visit  In general, how is the patients overall health? Good   BACK PAIN (STarT  Back Screening Tool) No

## 2023-12-31 NOTE — Patient Instructions (Signed)

## 2024-01-03 ENCOUNTER — Encounter (HOSPITAL_COMMUNITY): Payer: Self-pay | Admitting: Occupational Therapy

## 2024-01-03 ENCOUNTER — Ambulatory Visit (HOSPITAL_COMMUNITY): Admitting: Occupational Therapy

## 2024-01-03 DIAGNOSIS — M25611 Stiffness of right shoulder, not elsewhere classified: Secondary | ICD-10-CM

## 2024-01-03 DIAGNOSIS — M25511 Pain in right shoulder: Secondary | ICD-10-CM

## 2024-01-03 DIAGNOSIS — R29898 Other symptoms and signs involving the musculoskeletal system: Secondary | ICD-10-CM

## 2024-01-03 DIAGNOSIS — M25512 Pain in left shoulder: Secondary | ICD-10-CM | POA: Diagnosis not present

## 2024-01-03 NOTE — Therapy (Signed)
 OUTPATIENT OCCUPATIONAL THERAPY ORTHO TREATMENT NOTE  Patient Name: Sandra Schneider MRN: [id number] DOB:03-27-1956, 68 y.o., female Today's Date: 01/03/2024   END OF SESSION:  OT End of Session - 01/03/24 1021     Visit Number 2    Number of Visits 8    Date for OT Re-Evaluation 02/01/24    Authorization Type UHC Medicare (requesting 8 visits)    OT Start Time 0935    OT Stop Time 1015    OT Time Calculation (min) 40 min    Activity Tolerance Patient tolerated treatment well    Behavior During Therapy [facility] for tasks assessed/performed           Past Medical History:  Diagnosis Date   Breast cancer (HCC)    Coronary artery disease    Diabetes mellitus    GERD (gastroesophageal reflux disease)    Hypertension    Myocardial abscess    Myocardial infarct (HCC) 06/24/2012   Pulmonary hypertension (HCC)    Past Surgical History:  Procedure Laterality Date   ANTERIOR VITRECTOMY Left 07/21/2019   Procedure: ANTERIOR VITRECTOMY;  Surgeon: Harrie Agent, MD;  Location: [facility];  Service: Ophthalmology;  Laterality: Left;   ANTERIOR VITRECTOMY Right 08/04/2019   Procedure: ANTERIOR VITRECTOMY;  Surgeon: Harrie Agent, MD;  Location: [facility];  Service: Ophthalmology;  Laterality: Right;   CARDIAC CATHETERIZATION     CARDIOVERSION N/A 09/29/2022   Procedure: CARDIOVERSION;  Surgeon: Rolan Ezra RAMAN, MD;  Location: [facility] INVASIVE CV LAB;  Service: Cardiovascular;  Laterality: N/A;   CARDIOVERSION N/A 10/05/2022   Procedure: CARDIOVERSION;  Surgeon: Cherrie Toribio SAUNDERS, MD;  Location: [facility] INVASIVE CV LAB;  Service: Cardiovascular;  Laterality: N/A;   CATARACT EXTRACTION W/PHACO Left 07/21/2019   Procedure: CATARACT EXTRACTION PHACO AND INTRAOCULAR LENS PLACEMENT (IOC) (CDE: 11.45);  Surgeon: Harrie Agent, MD;  Location: [facility];  Service: Ophthalmology;  Laterality: Left;   CATARACT EXTRACTION W/PHACO Right 08/04/2019   Procedure: CATARACT EXTRACTION PHACO AND INTRAOCULAR LENS  PLACEMENT (IOC);  Surgeon: Harrie Agent, MD;  Location: [facility];  Service: Ophthalmology;  Laterality: Right;  CDE: 9.78   CESAREAN SECTION     COLONOSCOPY WITH PROPOFOL  N/A 05/22/2022   Procedure: COLONOSCOPY WITH PROPOFOL ;  Surgeon: Cindie Carlin POUR, DO;  [facility];  Service: Endoscopy;  Laterality: N/A;  10:00 am   CORONARY STENT PLACEMENT N/A 06/24/2012   LEFT HEART CATHETERIZATION WITH CORONARY ANGIOGRAM N/A 06/24/2012   Procedure: LEFT HEART CATHETERIZATION WITH CORONARY ANGIOGRAM;  Surgeon: Toribio SAUNDERS Cherrie, MD;  Location: [facility] CATH LAB;  Service: Cardiovascular;  Laterality: N/A;   right breast lumpectomy     RIGHT HEART CATH N/A 09/28/2022   Procedure: RIGHT HEART CATH;  Surgeon: Rolan Ezra RAMAN, MD;  Location: [facility] INVASIVE CV LAB;  Service: Cardiovascular;  Laterality: N/A;   RIGHT HEART CATH N/A 10/31/2022   Procedure: RIGHT HEART CATH;  Surgeon: Gardenia Led, DO;  Location: [facility] INVASIVE CV LAB;  Service: Cardiovascular;  Laterality: N/A;   RIGHT HEART CATH N/A 01/05/2023   Procedure: RIGHT HEART CATH;  Surgeon: Gardenia Led, DO;  Location: [facility] INVASIVE CV LAB;  Service: Cardiovascular;  Laterality: N/A;   RIGHT/LEFT HEART CATH AND CORONARY ANGIOGRAPHY N/A 02/07/2022   Procedure: RIGHT/LEFT HEART CATH AND CORONARY ANGIOGRAPHY;  Surgeon: Dann Candyce RAMAN, MD;  Location: [facility] INVASIVE CV LAB;  Service: Cardiovascular;  Laterality: N/A;   TEE WITHOUT CARDIOVERSION N/A 09/29/2022   Procedure: TRANSESOPHAGEAL ECHOCARDIOGRAM;  Surgeon: Rolan Ezra RAMAN, MD;  Location: [facility]  INVASIVE CV LAB;  Service: Cardiovascular;  Laterality: N/A;   Patient Active Problem List   Diagnosis Date Noted   Diabetic peripheral neuropathy (HCC) 02/12/2023   Lymphedema of lower extremity 02/12/2023   Chronic respiratory failure with hypoxia (HCC) 01/19/2023   RVF (right ventricular failure) (HCC) 09/28/2022   Persistent atrial fibrillation (HCC) 09/26/2022   Glaucoma (increased eye  pressure) 09/26/2022   OSA on CPAP 09/20/2022   Stage 4 chronic kidney disease (HCC) 09/09/2022   Acute on chronic diastolic CHF (congestive heart failure) (HCC) 09/08/2022   Hypoglycemia 09/07/2022   Pulmonary hypertension (HCC) 06/05/2022   Solitary pulmonary nodule on lung CT 06/05/2022   Rectal mass 05/16/2022   DOE (dyspnea on exertion)    Urticaria 10/29/2020   Adenomatous polyp of cecum 09/07/2020   History of radiation therapy 06/22/2020   Aromatase inhibitor use 02/04/2020   Osteopenia of multiple sites 02/04/2020   Breast cancer (HCC) 12/12/2019   Pain due to onychomycosis of toenails of both feet 12/12/2019   Pincer nail deformity 12/12/2019   Malignant neoplasm of upper-outer quadrant of right breast in female, estrogen receptor positive (HCC) 11/12/2019   Non-ST elevation (NSTEMI) myocardial infarction (HCC) 06/25/2012   Atherosclerotic heart disease of native coronary artery without angina pectoris 06/25/2012   Essential (primary) hypertension 06/25/2012   Type 2 diabetes mellitus without complications (HCC) 06/25/2012   Hyperlipidemia 06/25/2012   Normocytic anemia 06/25/2012   Class 3 obesity 06/25/2012   Type 2 diabetes mellitus with hyperglycemia (HCC) 06/25/2012    PCP: Teresa Jenkins Jansky, FNP REFERRING PROVIDER: Teresa Jenkins Jansky, FNP  ONSET DATE: ~6 months  REFERRING DIAG: M25.512 (ICD-10-CM) - Pain in left shoulder   THERAPY DIAG:  Right shoulder pain, unspecified chronicity  Shoulder stiffness, right  Other symptoms and signs involving the musculoskeletal system  Rationale for Evaluation and Treatment: Rehabilitation  SUBJECTIVE:   SUBJECTIVE STATEMENT: I just got done with my water aerobics class Pt accompanied by: self  PERTINENT HISTORY: No PMH.  PRECAUTIONS: None  WEIGHT BEARING RESTRICTIONS: No  PAIN:  Are you having pain? No- pulling sensation with movements  FALLS: Has patient fallen in last 6 months? Yes. Number of falls  1  PLOF: Independent  PATIENT GOALS: Improve mobility  NEXT MD VISIT: None Scheduled  OBJECTIVE:   HAND DOMINANCE: Right  ADLs: Overall ADLs: Pt unable to reach overhead or behind back, limiting dressing and bathing. Pain with attempting to lift anything with weight to it.   FUNCTIONAL OUTCOME MEASURES: Quick Dash: 40.91  UPPER EXTREMITY ROM:       Assessed in seated, er/IR adducted  Active ROM Left eval  Shoulder flexion 139  Shoulder abduction 127  Shoulder internal rotation 90  Shoulder external rotation 12  (Blank rows = not tested)    UPPER EXTREMITY MMT:     Assessed in seated, er/IR adducted  MMT Left eval  Shoulder flexion 4/5  Shoulder abduction 4-/5  Shoulder internal rotation 4-/5  Shoulder external rotation 3+/5  (Blank rows = not tested)  SENSATION: WFL  EDEMA: No swelling noted  OBSERVATIONS: Moderate fascial restrictions along biceps, scapula, trapezius   TODAY'S TREATMENT:  DATE:   01/03/24 -Manual Therapy: myofascial release and trigger point applied to biceps, deltoid, scapular region, and trapezius in order to reduce fascial restrictions and pain, as well as improve ROM. -A/ROM: seated, flexion, abduction, protraction, horizontal abduction, er/IR, x12 -X to V arms, x12 -Scapular Strengthening: red band, extension, retraction, rows, x12 -Shoulder Strengthening: red band, horizontal abduction, er, IR, x12 -Overhead lacing -UBE: level 2, 2.5' forwards and backwards, pace:   12/31/23 -A/ROM: seated, flexion, abduction, protraction, horizontal abduction, er/IR, x10    PATIENT EDUCATION: Education details: Publishing rights manager Person educated: Patient Education method: Explanation, Demonstration, and Handouts Education comprehension: verbalized understanding and returned demonstration  HOME EXERCISE  PROGRAM: 8/18: A/ROM 8/21: Scapular Strengthening  GOALS: Goals reviewed with patient? Yes   SHORT TERM GOALS: Target date: 02/01/24  Pt will be provided with and educated on HEP to improve mobility in RUE required for use during ADL completion.   Goal status: IN PROGRESS  LONG TERM GOALS: Target date: 02/01/24  Pt will decrease pain in RUE to 3/10 or less to improve ability to sleep for 2+ consecutive hours without waking due to pain.   Goal status: IN PROGRESS  2.  Pt will decrease RUE fascial restrictions to min amounts or less to improve mobility required for functional reaching tasks.   Goal status: IN PROGRESS  3.  Pt will increase RUE A/ROM by 20 degrees to improve ability to use RUE when reaching overhead or behind back during dressing and bathing tasks.   Goal status: IN PROGRESS  4.  Pt will increase RUE strength to 5/5 or greater to improve ability to use RUE when lifting or carrying items during meal preparation/housework/yardwork tasks.   Goal status: IN PROGRESS  5.  Pt will return to highest level of function using RUE as non-dominant during functional task completion.   Goal status: IN PROGRESS   ASSESSMENT:  CLINICAL IMPRESSION: This session pt presenting with no pain and overall good ROM. She is achieving 90% of full A/ROM with no pulling noted, except in the deltoid during abduction. Pt then working on scapular and shoulder strengthening with minimal fatigue and overhead lacing to address endurance and tolerance. OT providing verbal and tactile cuing for positioning and technique throughout session.   PERFORMANCE DEFICITS: in functional skills including in functional skills including ADLs, IADLs, coordination, tone, ROM, strength, pain, fascial restrictions, muscle spasms, and UE functional use.  PLAN:  OT FREQUENCY: 2x/week  OT DURATION: 4 weeks  PLANNED INTERVENTIONS: 97168 OT Re-evaluation, 97535 self care/ADL training, 02889 therapeutic exercise,  97530 therapeutic activity, 97112 neuromuscular re-education, 97140 manual therapy, 97035 ultrasound, 97010 moist heat, 97032 electrical stimulation (manual), passive range of motion, functional mobility training, energy conservation, coping strategies training, patient/family education, and DME and/or AE instructions  RECOMMENDED OTHER SERVICES: N/A  CONSULTED AND AGREED WITH PLAN OF CARE: Patient  PLAN FOR NEXT SESSION: Manual Therapy, A/ROM, stretching, strengthening   Valentin Nightingale, OTR/L [facility] [phone] Valentin Jillyn Nightingale, OT 01/03/2024, 10:22 AM

## 2024-01-03 NOTE — Patient Instructions (Signed)

## 2024-01-06 ENCOUNTER — Other Ambulatory Visit: Payer: Self-pay | Admitting: Cardiology

## 2024-01-06 DIAGNOSIS — E1169 Type 2 diabetes mellitus with other specified complication: Secondary | ICD-10-CM

## 2024-01-06 DIAGNOSIS — E1165 Type 2 diabetes mellitus with hyperglycemia: Secondary | ICD-10-CM

## 2024-01-08 ENCOUNTER — Ambulatory Visit (HOSPITAL_COMMUNITY): Admitting: Occupational Therapy

## 2024-01-08 ENCOUNTER — Encounter (HOSPITAL_COMMUNITY): Payer: Self-pay | Admitting: Occupational Therapy

## 2024-01-08 DIAGNOSIS — M25611 Stiffness of right shoulder, not elsewhere classified: Secondary | ICD-10-CM

## 2024-01-08 DIAGNOSIS — M25511 Pain in right shoulder: Secondary | ICD-10-CM

## 2024-01-08 DIAGNOSIS — R29898 Other symptoms and signs involving the musculoskeletal system: Secondary | ICD-10-CM

## 2024-01-08 DIAGNOSIS — M25512 Pain in left shoulder: Secondary | ICD-10-CM | POA: Diagnosis not present

## 2024-01-08 NOTE — Therapy (Unsigned)
 OUTPATIENT OCCUPATIONAL THERAPY ORTHO TREATMENT NOTE  Patient Name: Sandra Schneider MRN: [id number] DOB:13-Nov-1955, 68 y.o., female Today's Date: 01/09/2024   END OF SESSION:  OT End of Session - 01/08/24 1431     Visit Number 3    Number of Visits 8    Date for OT Re-Evaluation 02/01/24    Authorization Type [employer] Medicare (requesting 8 visits)    Authorization - Visit Number 1    Authorization - Number of Visits 8    Activity Tolerance Patient tolerated treatment well    Behavior During Therapy [facility] for tasks assessed/performed          Past Medical History:  Diagnosis Date   Breast cancer (HCC)    Coronary artery disease    Diabetes mellitus    GERD (gastroesophageal reflux disease)    Hypertension    Myocardial abscess    Myocardial infarct (HCC) 06/24/2012   Pulmonary hypertension (HCC)    Past Surgical History:  Procedure Laterality Date   ANTERIOR VITRECTOMY Left 07/21/2019   Procedure: ANTERIOR VITRECTOMY;  Surgeon: Harrie Agent, MD;  Location: [facility];  Service: Ophthalmology;  Laterality: Left;   ANTERIOR VITRECTOMY Right 08/04/2019   Procedure: ANTERIOR VITRECTOMY;  Surgeon: Harrie Agent, MD;  Location: [facility];  Service: Ophthalmology;  Laterality: Right;   CARDIAC CATHETERIZATION     CARDIOVERSION N/A 09/29/2022   Procedure: CARDIOVERSION;  Surgeon: Rolan Ezra RAMAN, MD;  Location: [facility] INVASIVE CV LAB;  Service: Cardiovascular;  Laterality: N/A;   CARDIOVERSION N/A 10/05/2022   Procedure: CARDIOVERSION;  Surgeon: Cherrie Toribio SAUNDERS, MD;  Location: [facility] INVASIVE CV LAB;  Service: Cardiovascular;  Laterality: N/A;   CATARACT EXTRACTION W/PHACO Left 07/21/2019   Procedure: CATARACT EXTRACTION PHACO AND INTRAOCULAR LENS PLACEMENT (IOC) (CDE: 11.45);  Surgeon: Harrie Agent, MD;  Location: [facility];  Service: Ophthalmology;  Laterality: Left;   CATARACT EXTRACTION W/PHACO Right 08/04/2019   Procedure: CATARACT EXTRACTION PHACO AND INTRAOCULAR LENS PLACEMENT  (IOC);  Surgeon: Harrie Agent, MD;  Location: [facility];  Service: Ophthalmology;  Laterality: Right;  CDE: 9.78   CESAREAN SECTION     COLONOSCOPY WITH PROPOFOL  N/A 05/22/2022   Procedure: COLONOSCOPY WITH PROPOFOL ;  Surgeon: Cindie Carlin POUR, DO;  Location: [facility];  Service: Endoscopy;  Laterality: N/A;  10:00 am   CORONARY STENT PLACEMENT N/A 06/24/2012   LEFT HEART CATHETERIZATION WITH CORONARY ANGIOGRAM N/A 06/24/2012   Procedure: LEFT HEART CATHETERIZATION WITH CORONARY ANGIOGRAM;  Surgeon: Toribio SAUNDERS Cherrie, MD;  Location: [facility] CATH LAB;  Service: Cardiovascular;  Laterality: N/A;   right breast lumpectomy     RIGHT HEART CATH N/A 09/28/2022   Procedure: RIGHT HEART CATH;  Surgeon: Rolan Ezra RAMAN, MD;  Location: [facility] INVASIVE CV LAB;  Service: Cardiovascular;  Laterality: N/A;   RIGHT HEART CATH N/A 10/31/2022   Procedure: RIGHT HEART CATH;  Surgeon: Gardenia Led, DO;  Location: [facility] INVASIVE CV LAB;  Service: Cardiovascular;  Laterality: N/A;   RIGHT HEART CATH N/A 01/05/2023   Procedure: RIGHT HEART CATH;  Surgeon: Gardenia Led, DO;  Location: [facility] INVASIVE CV LAB;  Service: Cardiovascular;  Laterality: N/A;   RIGHT/LEFT HEART CATH AND CORONARY ANGIOGRAPHY N/A 02/07/2022   Procedure: RIGHT/LEFT HEART CATH AND CORONARY ANGIOGRAPHY;  Surgeon: Dann Candyce RAMAN, MD;  Location: [facility] INVASIVE CV LAB;  Service: Cardiovascular;  Laterality: N/A;   TEE WITHOUT CARDIOVERSION N/A 09/29/2022   Procedure: TRANSESOPHAGEAL ECHOCARDIOGRAM;  Surgeon: Rolan Ezra RAMAN, MD;  Location: [facility] INVASIVE CV LAB;  Service: Cardiovascular;  Laterality: N/A;   Patient Active Problem List   Diagnosis Date Noted   Diabetic peripheral neuropathy (HCC) 02/12/2023   Lymphedema of lower extremity 02/12/2023   Chronic respiratory failure with hypoxia (HCC) 01/19/2023   RVF (right ventricular failure) (HCC) 09/28/2022   Persistent atrial fibrillation (HCC) 09/26/2022   Glaucoma (increased eye pressure)  09/26/2022   OSA on CPAP 09/20/2022   Stage 4 chronic kidney disease (HCC) 09/09/2022   Acute on chronic diastolic CHF (congestive heart failure) (HCC) 09/08/2022   Hypoglycemia 09/07/2022   Pulmonary hypertension (HCC) 06/05/2022   Solitary pulmonary nodule on lung CT 06/05/2022   Rectal mass 05/16/2022   DOE (dyspnea on exertion)    Urticaria 10/29/2020   Adenomatous polyp of cecum 09/07/2020   History of radiation therapy 06/22/2020   Aromatase inhibitor use 02/04/2020   Osteopenia of multiple sites 02/04/2020   Breast cancer (HCC) 12/12/2019   Pain due to onychomycosis of toenails of both feet 12/12/2019   Pincer nail deformity 12/12/2019   Malignant neoplasm of upper-outer quadrant of right breast in female, estrogen receptor positive (HCC) 11/12/2019   Non-ST elevation (NSTEMI) myocardial infarction (HCC) 06/25/2012   Atherosclerotic heart disease of native coronary artery without angina pectoris 06/25/2012   Essential (primary) hypertension 06/25/2012   Type 2 diabetes mellitus without complications (HCC) 06/25/2012   Hyperlipidemia 06/25/2012   Normocytic anemia 06/25/2012   Class 3 obesity 06/25/2012   Type 2 diabetes mellitus with hyperglycemia (HCC) 06/25/2012    PCP: Teresa Jenkins Jansky, FNP REFERRING PROVIDER: Teresa Jenkins Jansky, FNP  ONSET DATE: ~6 months  REFERRING DIAG: M25.512 (ICD-10-CM) - Pain in left shoulder   THERAPY DIAG:  Right shoulder pain, unspecified chronicity  Shoulder stiffness, right  Other symptoms and signs involving the musculoskeletal system  Rationale for Evaluation and Treatment: Rehabilitation  SUBJECTIVE:   SUBJECTIVE STATEMENT: I'm feeling good Pt accompanied by: self  PERTINENT HISTORY: No PMH.  PRECAUTIONS: None  WEIGHT BEARING RESTRICTIONS: No  PAIN:  Are you having pain? No- pulling sensation with movements  FALLS: Has patient fallen in last 6 months? Yes. Number of falls 1  PLOF: Independent  PATIENT GOALS:  Improve mobility  NEXT MD VISIT: None Scheduled  OBJECTIVE:   HAND DOMINANCE: Right  ADLs: Overall ADLs: Pt unable to reach overhead or behind back, limiting dressing and bathing. Pain with attempting to lift anything with weight to it.   FUNCTIONAL OUTCOME MEASURES: Quick Dash: 40.91  UPPER EXTREMITY ROM:       Assessed in seated, er/IR adducted  Active ROM Left eval  Shoulder flexion 139  Shoulder abduction 127  Shoulder internal rotation 90  Shoulder external rotation 12  (Blank rows = not tested)    UPPER EXTREMITY MMT:     Assessed in seated, er/IR adducted  MMT Left eval  Shoulder flexion 4/5  Shoulder abduction 4-/5  Shoulder internal rotation 4-/5  Shoulder external rotation 3+/5  (Blank rows = not tested)  SENSATION: WFL  EDEMA: No swelling noted  OBSERVATIONS: Moderate fascial restrictions along biceps, scapula, trapezius   TODAY'S TREATMENT:  DATE:   01/08/24 -Manual Therapy: myofascial release and trigger point applied to biceps, deltoid, scapular region, and trapezius in order to reduce fascial restrictions and pain, as well as improve ROM. -A/ROM: seated, flexion, abduction, protraction, horizontal abduction, er/IR, x12 -X to V arms, x12 -Goal Post arms, x12 -Functional reaching: 1 and 2# counter to 1st shelf ten 2nd shelf and back down x10 -Theraball: yellow weighted ball, flexion, overhead press, protraction, V ups, circles both directions, x10 -Proximal Shoulder Exercises: 1# paddles, circles both directions, criss cross, x10 each -ABC's in the air, 1# weight  01/03/24 -Manual Therapy: myofascial release and trigger point applied to biceps, deltoid, scapular region, and trapezius in order to reduce fascial restrictions and pain, as well as improve ROM. -A/ROM: seated, flexion, abduction, protraction, horizontal  abduction, er/IR, x12 -X to V arms, x12 -Scapular Strengthening: red band, extension, retraction, rows, x12 -Shoulder Strengthening: red band, horizontal abduction, er, IR, x12 -Overhead lacing -UBE: level 2, 2.5' forwards and backwards, pace:   12/31/23 -A/ROM: seated, flexion, abduction, protraction, horizontal abduction, er/IR, x10    PATIENT EDUCATION: Education details: Public relations account executive w/ dumbbell Person educated: Patient Education method: Explanation, Demonstration, and Handouts Education comprehension: verbalized understanding and returned demonstration  HOME EXERCISE PROGRAM: 8/18: A/ROM 8/21: Scapular Strengthening 8/26: Shoulder Strengthening w/ dumbbell  GOALS: Goals reviewed with patient? Yes   SHORT TERM GOALS: Target date: 02/01/24  Pt will be provided with and educated on HEP to improve mobility in RUE required for use during ADL completion.   Goal status: IN PROGRESS  LONG TERM GOALS: Target date: 02/01/24  Pt will decrease pain in RUE to 3/10 or less to improve ability to sleep for 2+ consecutive hours without waking due to pain.   Goal status: IN PROGRESS  2.  Pt will decrease RUE fascial restrictions to min amounts or less to improve mobility required for functional reaching tasks.   Goal status: IN PROGRESS  3.  Pt will increase RUE A/ROM by 20 degrees to improve ability to use RUE when reaching overhead or behind back during dressing and bathing tasks.   Goal status: IN PROGRESS  4.  Pt will increase RUE strength to 5/5 or greater to improve ability to use RUE when lifting or carrying items during meal preparation/housework/yardwork tasks.   Goal status: IN PROGRESS  5.  Pt will return to highest level of function using RUE as non-dominant during functional task completion.   Goal status: IN PROGRESS   ASSESSMENT:  CLINICAL IMPRESSION: Pt continues to have minimal pain and improving ROM. OT added light 1# weights this session which pt  struggled with, requiring multiple rest breaks. Her form decreased, along with increased fatigue for the rest of the session. Even utilizing a theraball for exercises to use BUE, pt was unable to maintain or achieve full ROM. Verbal and tactile cuing provided for positioning and technique throughout session for positioning and technique.   PERFORMANCE DEFICITS: in functional skills including in functional skills including ADLs, IADLs, coordination, tone, ROM, strength, pain, fascial restrictions, muscle spasms, and UE functional use.  PLAN:  OT FREQUENCY: 2x/week  OT DURATION: 4 weeks  PLANNED INTERVENTIONS: 97168 OT Re-evaluation, 97535 self care/ADL training, 02889 therapeutic exercise, 97530 therapeutic activity, 97112 neuromuscular re-education, 97140 manual therapy, 97035 ultrasound, 97010 moist heat, 97032 electrical stimulation (manual), passive range of motion, functional mobility training, energy conservation, coping strategies training, patient/family education, and DME and/or AE instructions  RECOMMENDED OTHER SERVICES: N/A  CONSULTED AND AGREED WITH PLAN OF  CARE: Patient  PLAN FOR NEXT SESSION: Manual Therapy, A/ROM, stretching, strengthening   Valentin Nightingale, OTR/L [facility] [phone] Valentin Jillyn Nightingale, OT 01/09/2024, 1:28 PM

## 2024-01-09 ENCOUNTER — Other Ambulatory Visit (HOSPITAL_COMMUNITY): Payer: Self-pay

## 2024-01-09 ENCOUNTER — Telehealth: Payer: Self-pay | Admitting: Pharmacy Technician

## 2024-01-09 NOTE — Telephone Encounter (Signed)
     I called [facility] since per test claim mounjaro  goes through for 0.00 for 3 months. They were trying to run qty 6ml for 28 days but that is for 84 days. They said they have a note that the mounjaro  is on backorder but they will try to get it and they will let us /the patient know. I told her our pharmacy has the mounjaro

## 2024-01-10 ENCOUNTER — Ambulatory Visit (HOSPITAL_COMMUNITY): Admitting: Occupational Therapy

## 2024-01-10 ENCOUNTER — Encounter (HOSPITAL_COMMUNITY): Payer: Self-pay | Admitting: Occupational Therapy

## 2024-01-10 DIAGNOSIS — M25511 Pain in right shoulder: Secondary | ICD-10-CM

## 2024-01-10 DIAGNOSIS — M25512 Pain in left shoulder: Secondary | ICD-10-CM | POA: Diagnosis not present

## 2024-01-10 DIAGNOSIS — M25611 Stiffness of right shoulder, not elsewhere classified: Secondary | ICD-10-CM

## 2024-01-10 DIAGNOSIS — R29898 Other symptoms and signs involving the musculoskeletal system: Secondary | ICD-10-CM

## 2024-01-10 NOTE — Therapy (Signed)
 OUTPATIENT OCCUPATIONAL THERAPY ORTHO TREATMENT NOTE  Patient Name: Sandra Schneider MRN: [id number] DOB:1956-03-27, 68 y.o., female Today's Date: 01/10/2024   END OF SESSION:  OT End of Session - 01/10/24 1528     Visit Number 4    Number of Visits 8    Date for OT Re-Evaluation 02/01/24    Authorization Type UHC Medicare (requesting 8 visits)    Authorization - Visit Number 2    Authorization - Number of Visits 8    OT Start Time 1446    OT Stop Time 1527    OT Time Calculation (min) 41 min    Activity Tolerance Patient tolerated treatment well    Behavior During Therapy WFL for tasks assessed/performed          Past Medical History:  Diagnosis Date   Breast cancer (HCC)    Coronary artery disease    Diabetes mellitus    GERD (gastroesophageal reflux disease)    Hypertension    Myocardial abscess    Myocardial infarct (HCC) 06/24/2012   Pulmonary hypertension (HCC)    Past Surgical History:  Procedure Laterality Date   ANTERIOR VITRECTOMY Left 07/21/2019   Procedure: ANTERIOR VITRECTOMY;  Surgeon: Harrie Agent, MD;  Location: [facility];  Service: Ophthalmology;  Laterality: Left;   ANTERIOR VITRECTOMY Right 08/04/2019   Procedure: ANTERIOR VITRECTOMY;  Surgeon: Harrie Agent, MD;  Location: [facility];  Service: Ophthalmology;  Laterality: Right;   CARDIAC CATHETERIZATION     CARDIOVERSION N/A 09/29/2022   Procedure: CARDIOVERSION;  Surgeon: Rolan Ezra RAMAN, MD;  Location: [facility] INVASIVE CV LAB;  Service: Cardiovascular;  Laterality: N/A;   CARDIOVERSION N/A 10/05/2022   Procedure: CARDIOVERSION;  Surgeon: Cherrie Toribio SAUNDERS, MD;  Location: [facility] INVASIVE CV LAB;  Service: Cardiovascular;  Laterality: N/A;   CATARACT EXTRACTION W/PHACO Left 07/21/2019   Procedure: CATARACT EXTRACTION PHACO AND INTRAOCULAR LENS PLACEMENT (IOC) (CDE: 11.45);  Surgeon: Harrie Agent, MD;  Location: [facility];  Service: Ophthalmology;  Laterality: Left;   CATARACT EXTRACTION W/PHACO Right  08/04/2019   Procedure: CATARACT EXTRACTION PHACO AND INTRAOCULAR LENS PLACEMENT (IOC);  Surgeon: Harrie Agent, MD;  Location: [facility];  Service: Ophthalmology;  Laterality: Right;  CDE: 9.78   CESAREAN SECTION     COLONOSCOPY WITH PROPOFOL  N/A 05/22/2022   Procedure: COLONOSCOPY WITH PROPOFOL ;  Surgeon: Cindie Carlin POUR, DO;  Location[facility];  Service: Endoscopy;  Laterality: N/A;  10:00 am   CORONARY STENT PLACEMENT N/A 06/24/2012   LEFT HEART CATHETERIZATION WITH CORONARY ANGIOGRAM N/A 06/24/2012   Procedure: LEFT HEART CATHETERIZATION WITH CORONARY ANGIOGRAM;  Surgeon: Toribio SAUNDERS Cherrie, MD;  Location: [facility] CATH LAB;  Service: Cardiovascular;  Laterality: N/A;   right breast lumpectomy     RIGHT HEART CATH N/A 09/28/2022   Procedure: RIGHT HEART CATH;  Surgeon: Rolan Ezra RAMAN, MD;  Location: [facility] INVASIVE CV LAB;  Service: Cardiovascular;  Laterality: N/A;   RIGHT HEART CATH N/A 10/31/2022   Procedure: RIGHT HEART CATH;  Surgeon: Gardenia Led, DO;  Location: [facility] INVASIVE CV LAB;  Service: Cardiovascular;  Laterality: N/A;   RIGHT HEART CATH N/A 01/05/2023   Procedure: RIGHT HEART CATH;  Surgeon: Gardenia Led, DO;  Location: [facility] INVASIVE CV LAB;  Service: Cardiovascular;  Laterality: N/A;   RIGHT/LEFT HEART CATH AND CORONARY ANGIOGRAPHY N/A 02/07/2022   Procedure: RIGHT/LEFT HEART CATH AND CORONARY ANGIOGRAPHY;  Surgeon: Dann Candyce RAMAN, MD;  Location: [facility] INVASIVE CV LAB;  Service: Cardiovascular;  Laterality: N/A;   TEE WITHOUT CARDIOVERSION  N/A 09/29/2022   Procedure: TRANSESOPHAGEAL ECHOCARDIOGRAM;  Surgeon: Rolan Ezra RAMAN, MD;  Location: [facility] INVASIVE CV LAB;  Service: Cardiovascular;  Laterality: N/A;   Patient Active Problem List   Diagnosis Date Noted   Diabetic peripheral neuropathy (HCC) 02/12/2023   Lymphedema of lower extremity 02/12/2023   Chronic respiratory failure with hypoxia (HCC) 01/19/2023   RVF (right ventricular failure) (HCC) 09/28/2022   Persistent  atrial fibrillation (HCC) 09/26/2022   Glaucoma (increased eye pressure) 09/26/2022   OSA on CPAP 09/20/2022   Stage 4 chronic kidney disease (HCC) 09/09/2022   Acute on chronic diastolic CHF (congestive heart failure) (HCC) 09/08/2022   Hypoglycemia 09/07/2022   Pulmonary hypertension (HCC) 06/05/2022   Solitary pulmonary nodule on lung CT 06/05/2022   Rectal mass 05/16/2022   DOE (dyspnea on exertion)    Urticaria 10/29/2020   Adenomatous polyp of cecum 09/07/2020   History of radiation therapy 06/22/2020   Aromatase inhibitor use 02/04/2020   Osteopenia of multiple sites 02/04/2020   Breast cancer (HCC) 12/12/2019   Pain due to onychomycosis of toenails of both feet 12/12/2019   Pincer nail deformity 12/12/2019   Malignant neoplasm of upper-outer quadrant of right breast in female, estrogen receptor positive (HCC) 11/12/2019   Non-ST elevation (NSTEMI) myocardial infarction (HCC) 06/25/2012   Atherosclerotic heart disease of native coronary artery without angina pectoris 06/25/2012   Essential (primary) hypertension 06/25/2012   Type 2 diabetes mellitus without complications (HCC) 06/25/2012   Hyperlipidemia 06/25/2012   Normocytic anemia 06/25/2012   Class 3 obesity 06/25/2012   Type 2 diabetes mellitus with hyperglycemia (HCC) 06/25/2012    PCP: Teresa Jenkins Jansky, FNP REFERRING PROVIDER: Teresa Jenkins Jansky, FNP  ONSET DATE: ~6 months  REFERRING DIAG: M25.512 (ICD-10-CM) - Pain in left shoulder   THERAPY DIAG:  Right shoulder pain, unspecified chronicity  Shoulder stiffness, right  Other symptoms and signs involving the musculoskeletal system  Rationale for Evaluation and Treatment: Rehabilitation  SUBJECTIVE:   SUBJECTIVE STATEMENT: I'm feeling good Pt accompanied by: self  PERTINENT HISTORY: No PMH.  PRECAUTIONS: None  WEIGHT BEARING RESTRICTIONS: No  PAIN:  Are you having pain? No- pulling sensation with movements  FALLS: Has patient fallen in last 6  months? Yes. Number of falls 1  PLOF: Independent  PATIENT GOALS: Improve mobility  NEXT MD VISIT: None Scheduled  OBJECTIVE:   HAND DOMINANCE: Right  ADLs: Overall ADLs: Pt unable to reach overhead or behind back, limiting dressing and bathing. Pain with attempting to lift anything with weight to it.   FUNCTIONAL OUTCOME MEASURES: Quick Dash: 40.91  UPPER EXTREMITY ROM:       Assessed in seated, er/IR adducted  Active ROM Left eval  Shoulder flexion 139  Shoulder abduction 127  Shoulder internal rotation 90  Shoulder external rotation 12  (Blank rows = not tested)    UPPER EXTREMITY MMT:     Assessed in seated, er/IR adducted  MMT Left eval  Shoulder flexion 4/5  Shoulder abduction 4-/5  Shoulder internal rotation 4-/5  Shoulder external rotation 3+/5  (Blank rows = not tested)  SENSATION: WFL  EDEMA: No swelling noted  OBSERVATIONS: Moderate fascial restrictions along biceps, scapula, trapezius   TODAY'S TREATMENT:  DATE:   01/10/24 -Manual Therapy: myofascial release and trigger point applied to biceps, deltoid, scapular region, and trapezius in order to reduce fascial restrictions and pain, as well as improve ROM. -A/ROM: seated, flexion, abduction, protraction, horizontal abduction, er/IR, x15 -X to V arms, x15 -Goal Post arms, x15 -Wall slides with stretch, x10 w/ 5 hold -PNF strengthening: red band, chest pulls, overhead pulls, er pulls, PNF up, PNF down, x12 -UBE: level 3, 2.5' forwards and backwards   01/08/24 -Manual Therapy: myofascial release and trigger point applied to biceps, deltoid, scapular region, and trapezius in order to reduce fascial restrictions and pain, as well as improve ROM. -A/ROM: seated, flexion, abduction, protraction, horizontal abduction, er/IR, x12 -X to V arms, x12 -Goal Post arms,  x12 -Functional reaching: 1 and 2# counter to 1st shelf ten 2nd shelf and back down x10 -Theraball: yellow weighted ball, flexion, overhead press, protraction, V ups, circles both directions, x10 -Proximal Shoulder Exercises: 1# paddles, circles both directions, criss cross, x10 each -ABC's in the air, 1# weight  01/03/24 -Manual Therapy: myofascial release and trigger point applied to biceps, deltoid, scapular region, and trapezius in order to reduce fascial restrictions and pain, as well as improve ROM. -A/ROM: seated, flexion, abduction, protraction, horizontal abduction, er/IR, x12 -X to V arms, x12 -Scapular Strengthening: red band, extension, retraction, rows, x12 -Shoulder Strengthening: red band, horizontal abduction, er, IR, x12 -Overhead lacing -UBE: level 2, 2.5' forwards and backwards, pace:    PATIENT EDUCATION: Education details: PNF Strengthening Person educated: Patient Education method: Explanation, Demonstration, and Handouts Education comprehension: verbalized understanding and returned demonstration  HOME EXERCISE PROGRAM: 8/18: A/ROM 8/21: Scapular Strengthening 8/26: Shoulder Strengthening w/ dumbbell 8/28: PNF Strengthening  GOALS: Goals reviewed with patient? Yes   SHORT TERM GOALS: Target date: 02/01/24  Pt will be provided with and educated on HEP to improve mobility in RUE required for use during ADL completion.   Goal status: IN PROGRESS  LONG TERM GOALS: Target date: 02/01/24  Pt will decrease pain in RUE to 3/10 or less to improve ability to sleep for 2+ consecutive hours without waking due to pain.   Goal status: IN PROGRESS  2.  Pt will decrease RUE fascial restrictions to min amounts or less to improve mobility required for functional reaching tasks.   Goal status: IN PROGRESS  3.  Pt will increase RUE A/ROM by 20 degrees to improve ability to use RUE when reaching overhead or behind back during dressing and bathing tasks.   Goal status:  IN PROGRESS  4.  Pt will increase RUE strength to 5/5 or greater to improve ability to use RUE when lifting or carrying items during meal preparation/housework/yardwork tasks.   Goal status: IN PROGRESS  5.  Pt will return to highest level of function using RUE as non-dominant during functional task completion.   Goal status: IN PROGRESS   ASSESSMENT:  CLINICAL IMPRESSION: Pt demonstrating good improvements this session. She is tolerating stretching further into her end ranges with both active ROM and wall stretches. Additionally, OT added PNF strengthening to continue addressing her functional strength for lifting and carrying items as needed. Verbal and tactile cuing provided for positioning and technique throughout session.   PERFORMANCE DEFICITS: in functional skills including in functional skills including ADLs, IADLs, coordination, tone, ROM, strength, pain, fascial restrictions, muscle spasms, and UE functional use.  PLAN:  OT FREQUENCY: 2x/week  OT DURATION: 4 weeks  PLANNED INTERVENTIONS: 97168 OT Re-evaluation, 97535 self care/ADL training, 02889 therapeutic exercise,  97530 therapeutic activity, 97112 neuromuscular re-education, 97140 manual therapy, 97035 ultrasound, 02989 moist heat, 97032 electrical stimulation (manual), passive range of motion, functional mobility training, energy conservation, coping strategies training, patient/family education, and DME and/or AE instructions  RECOMMENDED OTHER SERVICES: N/A  CONSULTED AND AGREED WITH PLAN OF CARE: Patient  PLAN FOR NEXT SESSION: Manual Therapy, A/ROM, stretching, strengthening   Sandra Schneider, OTR/L [facility] [phone] Sandra Schneider, OT 01/10/2024, 3:29 PM

## 2024-01-10 NOTE — Patient Instructions (Signed)

## 2024-01-15 ENCOUNTER — Encounter (HOSPITAL_COMMUNITY): Admitting: Occupational Therapy

## 2024-01-16 ENCOUNTER — Encounter (HOSPITAL_COMMUNITY): Payer: Self-pay | Admitting: Occupational Therapy

## 2024-01-16 ENCOUNTER — Ambulatory Visit (HOSPITAL_COMMUNITY): Attending: Family Medicine | Admitting: Occupational Therapy

## 2024-01-16 DIAGNOSIS — R29898 Other symptoms and signs involving the musculoskeletal system: Secondary | ICD-10-CM | POA: Diagnosis present

## 2024-01-16 DIAGNOSIS — M25611 Stiffness of right shoulder, not elsewhere classified: Secondary | ICD-10-CM | POA: Insufficient documentation

## 2024-01-16 DIAGNOSIS — M25511 Pain in right shoulder: Secondary | ICD-10-CM | POA: Diagnosis present

## 2024-01-16 NOTE — Therapy (Signed)
 OUTPATIENT OCCUPATIONAL THERAPY ORTHO TREATMENT NOTE  Patient Name: Sandra Schneider MRN: [id number] DOB:23-Nov-1955, 68 y.o., female Today's Date: 01/16/2024   END OF SESSION:  OT End of Session - 01/16/24 0934     Visit Number 5    Number of Visits 8    Date for OT Re-Evaluation 02/01/24    Authorization Type U[employer] Medicare (requesting 8 visits)    Authorization - Visit Number 3    Authorization - Number of Visits 8    OT Start Time [phone]    OT Stop Time 0934    OT Time Calculation (min) 41 min    Activity Tolerance Patient tolerated treatment well    Behavior During Therapy [facility] for tasks assessed/performed          Past Medical History:  Diagnosis Date   Breast cancer (HCC)    Coronary artery disease    Diabetes mellitus    GERD (gastroesophageal reflux disease)    Hypertension    Myocardial abscess    Myocardial infarct (HCC) 06/24/2012   Pulmonary hypertension (HCC)    Past Surgical History:  Procedure Laterality Date   ANTERIOR VITRECTOMY Left 07/21/2019   Procedure: ANTERIOR VITRECTOMY;  Surgeon: Harrie Agent, MD;  Location: [facility];  Service: Ophthalmology;  Laterality: Left;   ANTERIOR VITRECTOMY Right 08/04/2019   Procedure: ANTERIOR VITRECTOMY;  Surgeon: Harrie Agent, MD;  Location: [facility];  Service: Ophthalmology;  Laterality: Right;   CARDIAC CATHETERIZATION     CARDIOVERSION N/A 09/29/2022   Procedure: CARDIOVERSION;  Surgeon: Rolan Ezra RAMAN, MD;  Location: [facility] INVASIVE CV LAB;  Service: Cardiovascular;  Laterality: N/A;   CARDIOVERSION N/A 10/05/2022   Procedure: CARDIOVERSION;  Surgeon: Cherrie Toribio SAUNDERS, MD;  Location: [facility] INVASIVE CV LAB;  Service: Cardiovascular;  Laterality: N/A;   CATARACT EXTRACTION W/PHACO Left 07/21/2019   Procedure: CATARACT EXTRACTION PHACO AND INTRAOCULAR LENS PLACEMENT (IOC) (CDE: 11.45);  Surgeon: Harrie Agent, MD;  Location: [facility];  Service: Ophthalmology;  Laterality: Left;   CATARACT EXTRACTION W/PHACO Right  08/04/2019   Procedure: CATARACT EXTRACTION PHACO AND INTRAOCULAR LENS PLACEMENT (IOC);  Surgeon: Harrie Agent, MD;  Location: [facility];  Service: Ophthalmology;  Laterality: Right;  CDE: 9.78   CESAREAN SECTION     COLONOSCOPY WITH PROPOFOL  N/A 05/22/2022   Procedure: COLONOSCOPY WITH PROPOFOL ;  Surgeon: Cindie Carlin POUR, DO;  Location: [facility];  Service: Endoscopy;  Laterality: N/A;  10:00 am   CORONARY STENT PLACEMENT N/A 06/24/2012   LEFT HEART CATHETERIZATION WITH CORONARY ANGIOGRAM N/A 06/24/2012   Procedure: LEFT HEART CATHETERIZATION WITH CORONARY ANGIOGRAM;  Surgeon: Toribio SAUNDERS Cherrie, MD;  Location: [facility] CATH LAB;  Service: Cardiovascular;  Laterality: N/A;   right breast lumpectomy     RIGHT HEART CATH N/A 09/28/2022   Procedure: RIGHT HEART CATH;  Surgeon: Rolan Ezra RAMAN, MD;  Location: [facility] INVASIVE CV LAB;  Service: Cardiovascular;  Laterality: N/A;   RIGHT HEART CATH N/A 10/31/2022   Procedure: RIGHT HEART CATH;  Surgeon: Gardenia Led, DO;  Location: [facility] INVASIVE CV LAB;  Service: Cardiovascular;  Laterality: N/A;   RIGHT HEART CATH N/A 01/05/2023   Procedure: RIGHT HEART CATH;  Surgeon: Gardenia Led, DO;  Location: [facility] INVASIVE CV LAB;  Service: Cardiovascular;  Laterality: N/A;   RIGHT/LEFT HEART CATH AND CORONARY ANGIOGRAPHY N/A 02/07/2022   Procedure: RIGHT/LEFT HEART CATH AND CORONARY ANGIOGRAPHY;  Surgeon: Dann Candyce RAMAN, MD;  Location: [facility] INVASIVE CV LAB;  Service: Cardiovascular;  Laterality: N/A;   TEE WITHOUT CARDIOVERSION  N/A 09/29/2022   Procedure: TRANSESOPHAGEAL ECHOCARDIOGRAM;  Surgeon: Rolan Ezra RAMAN, MD;  Location: [facility] INVASIVE CV LAB;  Service: Cardiovascular;  Laterality: N/A;   Patient Active Problem List   Diagnosis Date Noted   Diabetic peripheral neuropathy (HCC) 02/12/2023   Lymphedema of lower extremity 02/12/2023   Chronic respiratory failure with hypoxia (HCC) 01/19/2023   RVF (right ventricular failure) (HCC) 09/28/2022   Persistent  atrial fibrillation (HCC) 09/26/2022   Glaucoma (increased eye pressure) 09/26/2022   OSA on CPAP 09/20/2022   Stage 4 chronic kidney disease (HCC) 09/09/2022   Acute on chronic diastolic CHF (congestive heart failure) (HCC) 09/08/2022   Hypoglycemia 09/07/2022   Pulmonary hypertension (HCC) 06/05/2022   Solitary pulmonary nodule on lung CT 06/05/2022   Rectal mass 05/16/2022   DOE (dyspnea on exertion)    Urticaria 10/29/2020   Adenomatous polyp of cecum 09/07/2020   History of radiation therapy 06/22/2020   Aromatase inhibitor use 02/04/2020   Osteopenia of multiple sites 02/04/2020   Breast cancer (HCC) 12/12/2019   Pain due to onychomycosis of toenails of both feet 12/12/2019   Pincer nail deformity 12/12/2019   Malignant neoplasm of upper-outer quadrant of right breast in female, estrogen receptor positive (HCC) 11/12/2019   Non-ST elevation (NSTEMI) myocardial infarction (HCC) 06/25/2012   Atherosclerotic heart disease of native coronary artery without angina pectoris 06/25/2012   Essential (primary) hypertension 06/25/2012   Type 2 diabetes mellitus without complications (HCC) 06/25/2012   Hyperlipidemia 06/25/2012   Normocytic anemia 06/25/2012   Class 3 obesity 06/25/2012   Type 2 diabetes mellitus with hyperglycemia (HCC) 06/25/2012    PCP: Teresa Jenkins Jansky, FNP REFERRING PROVIDER: Teresa Jenkins Jansky, FNP  ONSET DATE: ~6 months  REFERRING DIAG: M25.512 (ICD-10-CM) - Pain in left shoulder   THERAPY DIAG:  Right shoulder pain, unspecified chronicity  Shoulder stiffness, right  Other symptoms and signs involving the musculoskeletal system  Rationale for Evaluation and Treatment: Rehabilitation  SUBJECTIVE:   SUBJECTIVE STATEMENT: I'm doing alright Pt accompanied by: self  PERTINENT HISTORY: No PMH.  PRECAUTIONS: None  WEIGHT BEARING RESTRICTIONS: No  PAIN:  Are you having pain? No- pulling sensation with movements  FALLS: Has patient fallen in last 6  months? Yes. Number of falls 1  PLOF: Independent  PATIENT GOALS: Improve mobility  NEXT MD VISIT: None Scheduled  OBJECTIVE:   HAND DOMINANCE: Right  ADLs: Overall ADLs: Pt unable to reach overhead or behind back, limiting dressing and bathing. Pain with attempting to lift anything with weight to it.   FUNCTIONAL OUTCOME MEASURES: Quick Dash: 40.91  UPPER EXTREMITY ROM:       Assessed in seated, er/IR adducted  Active ROM Left eval  Shoulder flexion 139  Shoulder abduction 127  Shoulder internal rotation 90  Shoulder external rotation 12  (Blank rows = not tested)    UPPER EXTREMITY MMT:     Assessed in seated, er/IR adducted  MMT Left eval  Shoulder flexion 4/5  Shoulder abduction 4-/5  Shoulder internal rotation 4-/5  Shoulder external rotation 3+/5  (Blank rows = not tested)  SENSATION: WFL  EDEMA: No swelling noted  OBSERVATIONS: Moderate fascial restrictions along biceps, scapula, trapezius   TODAY'S TREATMENT:  DATE:   01/16/24 -Manual Therapy: myofascial release and trigger point applied to biceps, deltoid, scapular region, and trapezius in order to reduce fascial restrictions and pain, as well as improve ROM. -A/ROM: seated, flexion, abduction, protraction, horizontal abduction, er/IR, x15 -X to V arms, x15 -Goal Post arms, x15 -Proximal Shoulder Exercises: 1# paddles, circles both directions, criss cross, x15 each -Shoulder Strengthening: red band, flexion, abduction, er, IR, protraction, horizontal abduction, x12 -Scapular Strengthening: green band, extension, retraction, rows, x12 -Overhead Lacing 1# wrist weight  01/10/24 -Manual Therapy: myofascial release and trigger point applied to biceps, deltoid, scapular region, and trapezius in order to reduce fascial restrictions and pain, as well as improve ROM. -A/ROM:  seated, flexion, abduction, protraction, horizontal abduction, er/IR, x15 -X to V arms, x15 -Goal Post arms, x15 -Wall slides with stretch, x10 w/ 5 hold -PNF strengthening: red band, chest pulls, overhead pulls, er pulls, PNF up, PNF down, x12 -UBE: level 3, 2.5' forwards and backwards   01/08/24 -Manual Therapy: myofascial release and trigger point applied to biceps, deltoid, scapular region, and trapezius in order to reduce fascial restrictions and pain, as well as improve ROM. -A/ROM: seated, flexion, abduction, protraction, horizontal abduction, er/IR, x12 -X to V arms, x12 -Goal Post arms, x12 -Functional reaching: 1 and 2# counter to 1st shelf ten 2nd shelf and back down x10 -Theraball: yellow weighted ball, flexion, overhead press, protraction, V ups, circles both directions, x10 -Proximal Shoulder Exercises: 1# paddles, circles both directions, criss cross, x10 each -ABC's in the air, 1# weight   PATIENT EDUCATION: Education details: Shoulder Strengthening w/ theraband Person educated: Patient Education method: Explanation, Demonstration, and Handouts Education comprehension: verbalized understanding and returned demonstration  HOME EXERCISE PROGRAM: 8/18: A/ROM 8/21: Scapular Strengthening 8/26: Shoulder Strengthening w/ dumbbell 8/28: PNF Strengthening 9/3: Shoulder Strengthening  GOALS: Goals reviewed with patient? Yes   SHORT TERM GOALS: Target date: 02/01/24  Pt will be provided with and educated on HEP to improve mobility in RUE required for use during ADL completion.   Goal status: IN PROGRESS  LONG TERM GOALS: Target date: 02/01/24  Pt will decrease pain in RUE to 3/10 or less to improve ability to sleep for 2+ consecutive hours without waking due to pain.   Goal status: IN PROGRESS  2.  Pt will decrease RUE fascial restrictions to min amounts or less to improve mobility required for functional reaching tasks.   Goal status: IN PROGRESS  3.  Pt will  increase RUE A/ROM by 20 degrees to improve ability to use RUE when reaching overhead or behind back during dressing and bathing tasks.   Goal status: IN PROGRESS  4.  Pt will increase RUE strength to 5/5 or greater to improve ability to use RUE when lifting or carrying items during meal preparation/housework/yardwork tasks.   Goal status: IN PROGRESS  5.  Pt will return to highest level of function using RUE as non-dominant during functional task completion.   Goal status: IN PROGRESS   ASSESSMENT:  CLINICAL IMPRESSION: This session pt reports no pain and improved tolerance for resistance based exercises. She is working on flexion and abduction strengthening, as her ROM is now [facility]. Additionally her activity tolerance has improved and she is requiring no rest breaks this session. OT providing verbal and tactile cuing for positioning and technique throughout session.   PERFORMANCE DEFICITS: in functional skills including in functional skills including ADLs, IADLs, coordination, tone, ROM, strength, pain, fascial restrictions, muscle spasms, and UE functional use.  PLAN:  OT FREQUENCY: 2x/week  OT DURATION: 4 weeks  PLANNED INTERVENTIONS: 97168 OT Re-evaluation, 97535 self care/ADL training, 02889 therapeutic exercise, 97530 therapeutic activity, 97112 neuromuscular re-education, 97140 manual therapy, 97035 ultrasound, 97010 moist heat, 97032 electrical stimulation (manual), passive range of motion, functional mobility training, energy conservation, coping strategies training, patient/family education, and DME and/or AE instructions  RECOMMENDED OTHER SERVICES: N/A  CONSULTED AND AGREED WITH PLAN OF CARE: Patient  PLAN FOR NEXT SESSION: Manual Therapy, A/ROM, stretching, strengthening   Valentin Nightingale, OTR/L [facility] [phone] Valentin Jillyn Nightingale, OT 01/16/2024, 10:01 AM

## 2024-01-17 ENCOUNTER — Encounter (HOSPITAL_COMMUNITY): Admitting: Occupational Therapy

## 2024-01-17 ENCOUNTER — Encounter (HOSPITAL_COMMUNITY): Payer: Self-pay | Admitting: Occupational Therapy

## 2024-01-17 ENCOUNTER — Ambulatory Visit (HOSPITAL_COMMUNITY): Admitting: Occupational Therapy

## 2024-01-17 DIAGNOSIS — M25511 Pain in right shoulder: Secondary | ICD-10-CM

## 2024-01-17 DIAGNOSIS — M25611 Stiffness of right shoulder, not elsewhere classified: Secondary | ICD-10-CM

## 2024-01-17 DIAGNOSIS — R29898 Other symptoms and signs involving the musculoskeletal system: Secondary | ICD-10-CM

## 2024-01-17 NOTE — Therapy (Signed)
 OUTPATIENT OCCUPATIONAL THERAPY ORTHO TREATMENT NOTE  Patient Name: Sandra Schneider MRN: [id number] DOB:[birth year], 68 y.o., female Today's Date: 01/17/2024   END OF SESSION:  OT End of Session - 01/17/24 1344     Visit Number 6    Number of Visits 8    Date for OT Re-Evaluation 02/01/24    Authorization Type U[employer] Medicare    Authorization Time Period [employer] approved 8 visits 12/31/23-01/28/24    Authorization - Visit Number 4    Authorization - Number of Visits 8    OT Start Time 1305    OT Stop Time 1346    OT Time Calculation (min) 41 min    Activity Tolerance Patient tolerated treatment well    Behavior During Therapy [facility] for tasks assessed/performed           Past Medical History:  Diagnosis Date   Breast cancer (HCC)    Coronary artery disease    Diabetes mellitus    GERD (gastroesophageal reflux disease)    Hypertension    Myocardial abscess    Myocardial infarct (HCC) 06/24/2012   Pulmonary hypertension (HCC)    Past Surgical History:  Procedure Laterality Date   ANTERIOR VITRECTOMY Left 07/21/2019   Procedure: ANTERIOR VITRECTOMY;  Surgeon: Harrie Agent, MD;  Location: [facility];  Service: Ophthalmology;  Laterality: Left;   ANTERIOR VITRECTOMY Right 08/04/2019   Procedure: ANTERIOR VITRECTOMY;  Surgeon: Harrie Agent, MD;  Location: [facility];  Service: Ophthalmology;  Laterality: Right;   CARDIAC CATHETERIZATION     CARDIOVERSION N/A 09/29/2022   Procedure: CARDIOVERSION;  Surgeon: Rolan Ezra RAMAN, MD;  Location: [facility] INVASIVE CV LAB;  Service: Cardiovascular;  Laterality: N/A;   CARDIOVERSION N/A 10/05/2022   Procedure: CARDIOVERSION;  Surgeon: Cherrie Toribio SAUNDERS, MD;  Location: [facility] INVASIVE CV LAB;  Service: Cardiovascular;  Laterality: N/A;   CATARACT EXTRACTION W/PHACO Left 07/21/2019   Procedure: CATARACT EXTRACTION PHACO AND INTRAOCULAR LENS PLACEMENT (IOC) (CDE: 11.45);  Surgeon: Harrie Agent, MD;  Location: [facility];  Service: Ophthalmology;  Laterality:  Left;   CATARACT EXTRACTION W/PHACO Right 08/04/2019   Procedure: CATARACT EXTRACTION PHACO AND INTRAOCULAR LENS PLACEMENT (IOC);  Surgeon: Harrie Agent, MD;  Location: [facility];  Service: Ophthalmology;  Laterality: Right;  CDE: 9.78   CESAREAN SECTION     COLONOSCOPY WITH PROPOFOL  N/A 05/22/2022   Procedure: COLONOSCOPY WITH PROPOFOL ;  Surgeon: Cindie Carlin POUR, DO;  [facility]: [facility];  Service: Endoscopy;  Laterality: N/A;  10:00 am   CORONARY STENT PLACEMENT N/A 06/24/2012   LEFT HEART CATHETERIZATION WITH CORONARY ANGIOGRAM N/A 06/24/2012   Procedure: LEFT HEART CATHETERIZATION WITH CORONARY ANGIOGRAM;  Surgeon: Toribio SAUNDERS Cherrie, MD;  Location: [facility] CATH LAB;  Service: Cardiovascular;  Laterality: N/A;   right breast lumpectomy     RIGHT HEART CATH N/A 09/28/2022   Procedure: RIGHT HEART CATH;  Surgeon: Rolan Ezra RAMAN, MD;  Location: [facility] INVASIVE CV LAB;  Service: Cardiovascular;  Laterality: N/A;   RIGHT HEART CATH N/A 10/31/2022   Procedure: RIGHT HEART CATH;  Surgeon: Gardenia Led, DO;  Location: [facility] INVASIVE CV LAB;  Service: Cardiovascular;  Laterality: N/A;   RIGHT HEART CATH N/A 01/05/2023   Procedure: RIGHT HEART CATH;  Surgeon: Gardenia Led, DO;  Location: [facility] INVASIVE CV LAB;  Service: Cardiovascular;  Laterality: N/A;   RIGHT/LEFT HEART CATH AND CORONARY ANGIOGRAPHY N/A 02/07/2022   Procedure: RIGHT/LEFT HEART CATH AND CORONARY ANGIOGRAPHY;  Surgeon: Dann Candyce RAMAN, MD;  Location: [facility] INVASIVE CV LAB;  Service:  Cardiovascular;  Laterality: N/A;   TEE WITHOUT CARDIOVERSION N/A 09/29/2022   Procedure: TRANSESOPHAGEAL ECHOCARDIOGRAM;  Surgeon: Rolan Ezra RAMAN, MD;  Location: [facility] INVASIVE CV LAB;  Service: Cardiovascular;  Laterality: N/A;   Patient Active Problem List   Diagnosis Date Noted   Diabetic peripheral neuropathy (HCC) 02/12/2023   Lymphedema of lower extremity 02/12/2023   Chronic respiratory failure with hypoxia (HCC) 01/19/2023   RVF (right  ventricular failure) (HCC) 09/28/2022   Persistent atrial fibrillation (HCC) 09/26/2022   Glaucoma (increased eye pressure) 09/26/2022   OSA on CPAP 09/20/2022   Stage 4 chronic kidney disease (HCC) 09/09/2022   Acute on chronic diastolic CHF (congestive heart failure) (HCC) 09/08/2022   Hypoglycemia 09/07/2022   Pulmonary hypertension (HCC) 06/05/2022   Solitary pulmonary nodule on lung CT 06/05/2022   Rectal mass 05/16/2022   DOE (dyspnea on exertion)    Urticaria 10/29/2020   Adenomatous polyp of cecum 09/07/2020   History of radiation therapy 06/22/2020   Aromatase inhibitor use 02/04/2020   Osteopenia of multiple sites 02/04/2020   Breast cancer (HCC) 12/12/2019   Pain due to onychomycosis of toenails of both feet 12/12/2019   Pincer nail deformity 12/12/2019   Malignant neoplasm of upper-outer quadrant of right breast in female, estrogen receptor positive (HCC) 11/12/2019   Non-ST elevation (NSTEMI) myocardial infarction (HCC) 06/25/2012   Atherosclerotic heart disease of native coronary artery without angina pectoris 06/25/2012   Essential (primary) hypertension 06/25/2012   Type 2 diabetes mellitus without complications (HCC) 06/25/2012   Hyperlipidemia 06/25/2012   Normocytic anemia 06/25/2012   Class 3 obesity 06/25/2012   Type 2 diabetes mellitus with hyperglycemia (HCC) 06/25/2012    PCP: Teresa Jenkins Jansky, FNP REFERRING PROVIDER: Teresa Jenkins Jansky, FNP  ONSET DATE: ~6 months  REFERRING DIAG: M25.512 (ICD-10-CM) - Pain in left shoulder   THERAPY DIAG:  Right shoulder pain, unspecified chronicity  Shoulder stiffness, right  Other symptoms and signs involving the musculoskeletal system  Rationale for Evaluation and Treatment: Rehabilitation  SUBJECTIVE:   SUBJECTIVE STATEMENT: S: I think I'm getting a little better  PERTINENT HISTORY: No PMH.  PRECAUTIONS: None  WEIGHT BEARING RESTRICTIONS: No  PAIN:  Are you having pain? No- pulling sensation with  movements  FALLS: Has patient fallen in last 6 months? Yes. Number of falls 1  PLOF: Independent  PATIENT GOALS: Improve mobility  NEXT MD VISIT: None Scheduled  OBJECTIVE:   HAND DOMINANCE: Right  ADLs: Overall ADLs: Pt unable to reach overhead or behind back, limiting dressing and bathing. Pain with attempting to lift anything with weight to it.   FUNCTIONAL OUTCOME MEASURES: Quick Dash: 40.91  UPPER EXTREMITY ROM:       Assessed in seated, er/IR adducted  Active ROM Left eval  Shoulder flexion 139  Shoulder abduction 127  Shoulder internal rotation 90  Shoulder external rotation 12  (Blank rows = not tested)    UPPER EXTREMITY MMT:     Assessed in seated, er/IR adducted  MMT Left eval  Shoulder flexion 4/5  Shoulder abduction 4-/5  Shoulder internal rotation 4-/5  Shoulder external rotation 3+/5  (Blank rows = not tested)  SENSATION: WFL  EDEMA: No swelling noted  OBSERVATIONS: Moderate fascial restrictions along biceps, scapula, trapezius   TODAY'S TREATMENT:  DATE:  01/17/24 -Manual Therapy: myofascial release and trigger point applied to biceps, deltoid, scapular region, and trapezius in order to reduce fascial restrictions and pain, as well as improve ROM. -Strengthening: 1#, supine-protraction, flexion, horizontal abduction, er, abduction, 10 reps -Proximal Shoulder Exercises: supine, 1#- paddles, circles both directions, criss cross, 10 reps each -A/ROM: standing- flexion, abduction, protraction, horizontal abduction, er/IR, 10 reps -X to V arms: 10 reps -Proximal Shoulder Exercises: standing- paddles, circles both directions, criss cross, 10 reps each -Scapular Strengthening: green band, extension, retraction, rows, 10 reps -Overhead lacing: seated, lacing from top down then reversing -Ball on wall: 1' flexion -Functional  reaching: pt wearing 1# weight, removing items from middle shelf of overhead cabinet and placing on countertop, putting items from bottom shelf on the middle shelf, then replacing the countertop items on the bottom shelf -UBE: level 2, 2' forward, 2' reverse, pace: 10.5  01/16/24 -Manual Therapy: myofascial release and trigger point applied to biceps, deltoid, scapular region, and trapezius in order to reduce fascial restrictions and pain, as well as improve ROM. -A/ROM: seated, flexion, abduction, protraction, horizontal abduction, er/IR, x15 -X to V arms, x15 -Goal Post arms, x15 -Proximal Shoulder Exercises: 1# paddles, circles both directions, criss cross, x15 each -Shoulder Strengthening: red band, flexion, abduction, er, IR, protraction, horizontal abduction, x12 -Scapular Strengthening: green band, extension, retraction, rows, x12 -Overhead Lacing 1# wrist weight  01/10/24 -Manual Therapy: myofascial release and trigger point applied to biceps, deltoid, scapular region, and trapezius in order to reduce fascial restrictions and pain, as well as improve ROM. -A/ROM: seated, flexion, abduction, protraction, horizontal abduction, er/IR, x15 -X to V arms, x15 -Goal Post arms, x15 -Wall slides with stretch, x10 w/ 5 hold -PNF strengthening: red band, chest pulls, overhead pulls, er pulls, PNF up, PNF down, x12 -UBE: level 3, 2.5' forwards and backwards     PATIENT EDUCATION: Education details: reviewed HEP Person educated: Patient Education method: Explanation, Demonstration, and Handouts Education comprehension: verbalized understanding and returned demonstration  HOME EXERCISE PROGRAM: 8/18: A/ROM 8/21: Scapular Strengthening 8/26: Shoulder Strengthening w/ dumbbell 8/28: PNF Strengthening 9/3: Shoulder Strengthening with theraband-red  GOALS: Goals reviewed with patient? Yes   SHORT TERM GOALS: Target date: 02/01/24  Pt will be provided with and educated on HEP to improve  mobility in RUE required for use during ADL completion.   Goal status: IN PROGRESS  LONG TERM GOALS: Target date: 02/01/24  Pt will decrease pain in RUE to 3/10 or less to improve ability to sleep for 2+ consecutive hours without waking due to pain.   Goal status: IN PROGRESS  2.  Pt will decrease RUE fascial restrictions to min amounts or less to improve mobility required for functional reaching tasks.   Goal status: IN PROGRESS  3.  Pt will increase RUE A/ROM by 20 degrees to improve ability to use RUE when reaching overhead or behind back during dressing and bathing tasks.   Goal status: IN PROGRESS  4.  Pt will increase RUE strength to 5/5 or greater to improve ability to use RUE when lifting or carrying items during meal preparation/housework/yardwork tasks.   Goal status: IN PROGRESS  5.  Pt will return to highest level of function using RUE as non-dominant during functional task completion.   Goal status: IN PROGRESS   ASSESSMENT:  CLINICAL IMPRESSION: Pt reports she is doing well, noted she felt like water aerobics were a little better this week. Pt continuing to work on activity tolerance required for LUE use  during ADLs and functional reaching tasks. Pt requiring short, intermittent rest breaks but was able to complete tasks with min difficulty. Added stability work with ball on wall, mod fatigue reported. Verbal cuing for form and technique during tasks.     PERFORMANCE DEFICITS: in functional skills including in functional skills including ADLs, IADLs, coordination, tone, ROM, strength, pain, fascial restrictions, muscle spasms, and UE functional use.  PLAN:  OT FREQUENCY: 2x/week  OT DURATION: 4 weeks  PLANNED INTERVENTIONS: 97168 OT Re-evaluation, 97535 self care/ADL training, 02889 therapeutic exercise, 97530 therapeutic activity, 97112 neuromuscular re-education, 97140 manual therapy, 97035 ultrasound, 97010 moist heat, 97032 electrical stimulation (manual),  passive range of motion, functional mobility training, energy conservation, coping strategies training, patient/family education, and DME and/or AE instructions  RECOMMENDED OTHER SERVICES: N/A  CONSULTED AND AGREED WITH PLAN OF CARE: Patient  PLAN FOR NEXT SESSION: Manual Therapy, A/ROM, stretching, strengthening   Sonny Cory, OTR/L  [phone] 01/17/2024, 1:47 PM

## 2024-01-17 NOTE — Progress Notes (Incomplete)
 4r  ADVANCED HEART FAILURE CLINIC NOTE  Referring Physician: Alliance, Raynaldo Haws*  Primary Care: Alliance, [facility] Primary Cardiologist: Dr. Dorn Ross  CC: Pulmonary Hypertension  HPI: Sandra Schneider is a 68 y.o. female with CAD (NSTEMI 06/2012 w/ PCI to LAD and Lcx) LVEF 55% at that time, OSA, HTN, hyperlipidemia, hx of breast ca s/p lumpectomy and persistent shortness of breath and lower extremity edema presenting today for follow up. She also follows Dr. Alva for OSA.  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  She has noticed that over the past several months she has had worsening lower extremity edema and difficulty ambulating due to shortness of breath.  She had a echocardiogram concerning for pulmonary hypertension followed by right heart cath with a mean PA of 69 mmHg.  Her cardiac output was 7 L/min during that case.  She had a workup for interstitial lung disease by pulmonology with CT chest that was negative.  However there was a right upper lobe lung nodule with negative PET scan.  In addition she has had a colonoscopy and mammogram due to hypermetabolic areas in the colon and breast both of which were also negative.   Since her initial appointment we started her on sildenafil  20 mg 3 times daily.  Unfortunately due to continued dietary indiscretion, significant volume intake and medication noncompliance she presented severely hypervolemic during her last appointment in May 2024.  She was directly admitted to the hospital and diuresed 35 pounds during that admission.  Right heart cath confirmed severe precapillary pulmonary hypertension.  Sildenafil  was increased to 40 mg 3 times daily.  Interval hx:  - She is now on uptravi , sildenafil  and opsumit .  -Continues to do very well from a functional standpoint. She goes to the [facility] 4 days weekly. Reports that she had a fall there roughly 3 weeks ago on her right hip. She has  now returned to the [facility].     Current Outpatient Medications  Medication Sig Dispense Refill   acetaminophen  (TYLENOL ) 325 MG tablet Take 650 mg by mouth every 6 (six) hours as needed for moderate pain.     amiodarone  (PACERONE ) 200 MG tablet Take 1 tablet by mouth once daily 60 tablet 11   amLODipine  (NORVASC ) 5 MG tablet Take 1 tablet by mouth once daily 90 tablet 3   apixaban  (ELIQUIS ) 5 MG TABS tablet Take 1 tablet by mouth twice daily 180 tablet 1   Ascorbic Acid (VITAMIN C PO) Take 1 tablet by mouth daily.     atorvastatin  (LIPITOR ) 40 MG tablet Take 1 tablet by mouth once daily 90 tablet 3   Blood Pressure Monitor DEVI 1 each by Does not apply route daily. 1 each 0   Carboxymethylcellul-Glycerin (REFRESH OPTIVE) 1-0.9 % GEL Place 1 drop into both eyes as needed (dry eyes).     cetirizine (ZYRTEC) 10 MG tablet Take 10 mg by mouth daily.     Cholecalciferol (VITAMIN D) 50 MCG (2000 UT) tablet Take 2,000 Units by mouth daily.     famotidine  (PEPCID ) 20 MG tablet Take 20 mg by mouth daily.     letrozole (FEMARA) 2.5 MG tablet Take 2.5 mg by mouth daily.     macitentan  (OPSUMIT ) 10 MG tablet Take 1 tablet (10 mg total) by mouth daily. 90 tablet 3   Multiple Vitamin (MULTIVITAMIN) tablet Take 1 tablet by mouth daily.     OXYGEN  Inhale 4 L into the lungs continuous.  Polyethyl Glycol-Propyl Glycol (SYSTANE) 0.4-0.3 % SOLN Place 1 drop into both eyes as needed (dry eyes).     potassium chloride  (KLOR-CON ) 10 MEQ tablet Take 2 tablets (20 mEq total) by mouth daily. 60 tablet 6   prednisoLONE acetate (PRED FORTE) 1 % ophthalmic suspension Place 1 drop into both eyes in the morning and at bedtime.     Selexipag  (UPTRAVI ) 1600 MCG TABS Take 1 tablet (1,600 mcg total) by mouth 2 (two) times daily. 60 tablet 11   sildenafil  (REVATIO ) 20 MG tablet TAKE 2 TABLETS BY MOUTH THREE TIMES DAILY 180 tablet 3   tirzepatide  (MOUNJARO ) 15 MG/0.5ML Pen INJECT 15 MG UNDER THE SKIN ONCE A WEEK 6 mL 0    torsemide  (DEMADEX ) 20 MG tablet TAKE 40MG  IN THE MORNING, AND 20MG  IN THE EVENING 120 tablet 6   traMADol (ULTRAM) 50 MG tablet Take 50 mg by mouth 4 (four) times daily as needed.     No current facility-administered medications for this visit.    No Known Allergies  PHYSICAL EXAM: There were no vitals filed for this visit. GENERAL: NAD Lungs- *** CARDIAC:  JVP: *** cm          Normal rate with regular rhythm. *** murmur.  Pulses ***. *** edema.  ABDOMEN: Soft, non-tender, non-distended.  EXTREMITIES: Warm and well perfused.  NEUROLOGIC: No obvious FND    DATA REVIEW  ECG:06/13/22: NSR with 1AVB  ECHO: 01/18/22: LVBEF 60-65%. Moderately dilated RV with mild reduction in function. Dilated RA.  CATH: 02/08/23:   Prox Cx to Dist Cx lesion is 25% stenosed.   Mid LAD lesion is 25% stenosed.   2nd Mrg lesion is 100% stenosed.  Right to left collaterals.  Left to left collaterals.   The left ventricular systolic function is normal.   LV end diastolic pressure is mildly elevated.   The left ventricular ejection fraction is greater than 65% by visual estimate.   Hemodynamic findings consistent with severe pulmonary hypertension.   There is no aortic valve stenosis.   Aortic saturation 96% on 3 L nasal cannula, PA saturation 69% on 3 L, mean RA pressure 32 mm Hg; PA pressure 107/48, mean PA pressure 69 mmHg, pulmonary capillary wedge pressure difficult to assess due to significant V waves, and difficulty wedging.  Cardiac output 7.03 L/min, cardiac index 3.07.   Patent stents with mild restenosis in the LAD and circumflex.  Small obtuse marginal appears occluded distally with some right to left collaterals.  No target for PCI.  RHC 09/28/22: RA 32 RV 95/32 PA 93/44, mean 66 PCWP mean 19  Oxygen  saturations: PA 49% AO 96%  Cardiac Output (Fick) 3.98  Cardiac Index (Fick) 1.73 PVR 11.8 PAPi 1.5   10/31/22:  HEMODYNAMICS: RA:                  10 mmHg (mean) RV:                   104/3-10 mmHg PA:                  100/27 mmHg (57 mean) PCWP:            9 mmHg (mean)                                      Estimated Fick CO/CI   6.7 L/min, 3.15 L/min/m2  TPG                 48  mmHg                                            PVR                 7 Wood Units  PAPi                10.4    PFTS: 03/21/22: 03/2022 moderate restriction, ratio 84, FEV1 65%, FVC 59%, TLC 74%, DLCO 13.6/64%.  ERV 8% with wt 275   12/22/22: :  Pt ambulated 770ft (228.60m) O2 Sat ranged 100-95% on 4L oxygen  HR ranged 71-88  07/03/23: Pt ambulated 1100 ft (335.28 m) O2 Sat ranged 99-100 on 2 L oxygen  HR ranged 65-71   ASSESSMENT & PLAN:  Pulmonary hypertension, Group I + III - Initial RHC waveforms reviewed personally. RA 30, RV 107/20-30, PA 107/48 (69), unable to wedge; LVEDP 15. AO 140/64. LVEDP ~16. Assuming a PCWP of 20, TPG would be 49 w/ PVR of 7.  - Hemodynamics consistent with severe pulmonary hypertension largely due to pre-capillary PH.  -Cardiac MRI in 5/24 with EF 75%, moderate RV dilation with RV EF 50%, moderate TR, mid-wall basal septal/basal inferolateral LGE. Could be seen with cardiac sarcoidosis (vs prior myocarditis). CT chest showed RUL nodule (negative on PET) and mediastinal LAN. V/Q scan not suggestive of chronic PEs. RHC 5/16 showed severe PAH with low CI 1.73, PAPi 1.5, RA pressure 32 (R>>L heart failure). TEE this admission with LV EF 60-65%, D-shaped septum, moderate RV dilation with moderate RV dysfunction, mod-severe TR. Likely end-stage PH/cor pulmonale d/t OHS/OSA and group 1 PAH.  - Labs: ANA, HCV negative. HIV pending. LFTs mostly unremarkable.  - PFTs w/ moderate restriction.  - CT chest w/ pulmonary nodules, however, follow up PET mostly unremarkable.  - Currently on sildenafil  40mg  TID, opsumit  & uptravi .  - Participating in pulmonary rehab now.   - Excellent ; she was previously on 4L O2 with prior  walk distance of 235m; today on 2L O2 she walked 367m with no hypoxia. Will continue current triple therapy. Still waiting for pulmonology to decrease total O2.  -  She is going to try to reduce her O2 to 2LPM. Reports she will have night time oximetry assessed prior to reducing it. She wishes to have a portable O2. - Torsemide  40mg  BID currently; will decrease PM dose to 20mg .  - sCr 1.72 today. BNP 33.   2. OSA - followed by pulmonology.  - Now using CPAP nightly - Followed by Dr. Jude; reviewed notes from 01/19/23 - Seen by Almarie Ferrari on 07/04/23; weaning night time O2 as able.  - Reviewed notes from pulmonology on 10/22/23, planning to do overnight oximetry test to evaluate for 2L O2. Possibly planning to reduce O2 to 2LPM.   3. CAD - LHC as above - followed by general cardiology.  - no chest pain.   4. Severe obesity - significant weight loss with Mounjoro.   5. T2DM - Only taking mounjoro  - A1C now down to 5.3  - followed by PCP.   6. CKD IIIB - sCr 1.79 on 08/31/23. Repeat today.   I spent *** minutes caring for this patient today including face to face time, ordering and reviewing labs,  reviewing records from ***, seeing the patient, documenting in the record, and arranging follow ups.   Shevette Bess Advanced Heart Failure Mechanical Circulatory Support

## 2024-01-18 ENCOUNTER — Ambulatory Visit (HOSPITAL_COMMUNITY)
Admission: RE | Admit: 2024-01-18 | Discharge: 2024-01-18 | Disposition: A | Discharge status: Home / Self Care | Source: Ambulatory Visit | Attending: Cardiology | Admitting: Cardiology

## 2024-01-18 ENCOUNTER — Encounter (HOSPITAL_COMMUNITY): Payer: Self-pay | Admitting: Cardiology

## 2024-01-18 VITALS — BP 122/60 | HR 70 | Ht 66.0 in | Wt 168.2 lb

## 2024-01-18 DIAGNOSIS — Z9981 Dependence on supplemental oxygen: Secondary | ICD-10-CM | POA: Insufficient documentation

## 2024-01-18 DIAGNOSIS — Z853 Personal history of malignant neoplasm of breast: Secondary | ICD-10-CM | POA: Insufficient documentation

## 2024-01-18 DIAGNOSIS — I252 Old myocardial infarction: Secondary | ICD-10-CM | POA: Insufficient documentation

## 2024-01-18 DIAGNOSIS — I509 Heart failure, unspecified: Secondary | ICD-10-CM | POA: Diagnosis not present

## 2024-01-18 DIAGNOSIS — G4733 Obstructive sleep apnea (adult) (pediatric): Secondary | ICD-10-CM | POA: Diagnosis not present

## 2024-01-18 DIAGNOSIS — I50810 Right heart failure, unspecified: Secondary | ICD-10-CM | POA: Diagnosis not present

## 2024-01-18 DIAGNOSIS — Z7985 Long-term (current) use of injectable non-insulin antidiabetic drugs: Secondary | ICD-10-CM | POA: Insufficient documentation

## 2024-01-18 DIAGNOSIS — E785 Hyperlipidemia, unspecified: Secondary | ICD-10-CM | POA: Diagnosis not present

## 2024-01-18 DIAGNOSIS — E66813 Obesity, class 3: Secondary | ICD-10-CM | POA: Diagnosis not present

## 2024-01-18 DIAGNOSIS — I2510 Atherosclerotic heart disease of native coronary artery without angina pectoris: Secondary | ICD-10-CM | POA: Insufficient documentation

## 2024-01-18 DIAGNOSIS — J9611 Chronic respiratory failure with hypoxia: Secondary | ICD-10-CM | POA: Diagnosis not present

## 2024-01-18 DIAGNOSIS — I130 Hypertensive heart and chronic kidney disease with heart failure and stage 1 through stage 4 chronic kidney disease, or unspecified chronic kidney disease: Secondary | ICD-10-CM | POA: Diagnosis not present

## 2024-01-18 DIAGNOSIS — Z6841 Body Mass Index (BMI) 40.0 and over, adult: Secondary | ICD-10-CM

## 2024-01-18 DIAGNOSIS — E1122 Type 2 diabetes mellitus with diabetic chronic kidney disease: Secondary | ICD-10-CM | POA: Insufficient documentation

## 2024-01-18 DIAGNOSIS — I272 Pulmonary hypertension, unspecified: Secondary | ICD-10-CM | POA: Insufficient documentation

## 2024-01-18 DIAGNOSIS — N1832 Chronic kidney disease, stage 3b: Secondary | ICD-10-CM | POA: Diagnosis not present

## 2024-01-18 DIAGNOSIS — I2721 Secondary pulmonary arterial hypertension: Secondary | ICD-10-CM | POA: Insufficient documentation

## 2024-01-18 DIAGNOSIS — Z79899 Other long term (current) drug therapy: Secondary | ICD-10-CM | POA: Insufficient documentation

## 2024-01-18 DIAGNOSIS — Z955 Presence of coronary angioplasty implant and graft: Secondary | ICD-10-CM | POA: Insufficient documentation

## 2024-01-18 LAB — COMPREHENSIVE METABOLIC PANEL WITH GFR
ALT: 21 U/L (ref 0–44)
AST: 21 U/L (ref 15–41)
Albumin: 4.2 g/dL (ref 3.5–5.0)
Alkaline Phosphatase: 78 U/L (ref 38–126)
Anion gap: 9 (ref 5–15)
BUN: 26 mg/dL — ABNORMAL HIGH (ref 8–23)
CO2: 27 mmol/L (ref 22–32)
Calcium: 9.4 mg/dL (ref 8.9–10.3)
Chloride: 102 mmol/L (ref 98–111)
Creatinine, Ser: 1.81 mg/dL — ABNORMAL HIGH (ref 0.44–1.00)
GFR, Estimated: 30 mL/min — ABNORMAL LOW (ref >60)
Glucose, Bld: 89 mg/dL (ref 70–99)
Potassium: 4.8 mmol/L (ref 3.5–5.1)
Sodium: 138 mmol/L (ref 135–145)
Total Bilirubin: 0.6 mg/dL (ref 0.0–1.2)
Total Protein: 7.5 g/dL (ref 6.5–8.1)

## 2024-01-18 LAB — BRAIN NATRIURETIC PEPTIDE: B Natriuretic Peptide: 49.5 pg/mL (ref 0.0–100.0)

## 2024-01-18 NOTE — Progress Notes (Signed)
 6 Min Walk Test Completed  Pt ambulated 1411ft (426.61m) O2 Sat ranged 99-96  HR ranged 78-76

## 2024-01-18 NOTE — Patient Instructions (Addendum)
 Medication Changes:  No Changes In Medications at this time.   Lab Work:  Labs done today, your results will be available in MyChart, we will contact you for abnormal readings.  OVERNIGHT OXIMETRY---HOME HEALTH WILL REACH OUT TO ARRANGE THIS    Follow-Up in: 2 MONTHS WITH PHARMACY AS SCHEDULED   At the [facility], you and your health needs are our priority. We have a designated team specialized in the treatment of Heart Failure. This Care Team includes your primary Heart Failure Specialized Cardiologist (physician), Advanced Practice Providers (APPs- Physician Assistants and Nurse Practitioners), and Pharmacist who all work together to provide you with the care you need, when you need it.   You may see any of the following providers on your designated Care Team at your next follow up:  Dr. Toribio Fuel Dr. Ezra Shuck Dr. Ria Commander Dr. Odis Brownie Greig Mosses, NP Caffie Shed, [place] [facility] [place], [place] Beckey Coe, NP [place] Lee, NP Tinnie Redman, PharmD   Please be sure to bring in all your medications bottles to every appointment.   Need to Contact Us :  If you have any questions or concerns before your next appointment please send us  a message through [place] or call our office at [phone].    TO LEAVE A MESSAGE FOR THE NURSE SELECT OPTION 2, PLEASE LEAVE A MESSAGE INCLUDING: YOUR NAME DATE OF BIRTH CALL BACK NUMBER REASON FOR CALL**this is important as we prioritize the call backs  YOU WILL RECEIVE A CALL BACK THE SAME DAY AS LONG AS YOU CALL BEFORE 4:00 PM

## 2024-01-23 ENCOUNTER — Ambulatory Visit (HOSPITAL_COMMUNITY): Admitting: Occupational Therapy

## 2024-01-23 ENCOUNTER — Encounter (HOSPITAL_COMMUNITY): Payer: Self-pay | Admitting: Occupational Therapy

## 2024-01-23 DIAGNOSIS — R29898 Other symptoms and signs involving the musculoskeletal system: Secondary | ICD-10-CM

## 2024-01-23 DIAGNOSIS — M25611 Stiffness of right shoulder, not elsewhere classified: Secondary | ICD-10-CM

## 2024-01-23 DIAGNOSIS — M25511 Pain in right shoulder: Secondary | ICD-10-CM | POA: Diagnosis not present

## 2024-01-23 NOTE — Patient Instructions (Signed)

## 2024-01-23 NOTE — Therapy (Signed)
 OUTPATIENT OCCUPATIONAL THERAPY ORTHO TREATMENT NOTE  Patient Name: Sandra Schneider MRN: [id number] DOB:[birth year], 68 y.o., female Today's Date: 01/23/2024   END OF SESSION:  OT End of Session - 01/23/24 0926     Visit Number 7    Number of Visits 8    Date for OT Re-Evaluation 02/01/24    Authorization Type [employer] Medicare    Authorization Time Period U[employer] approved 8 visits 12/31/23-01/28/24    Authorization - Visit Number 5    Authorization - Number of Visits 8    OT Start Time 0845    OT Stop Time 0926    OT Time Calculation (min) 41 min    Activity Tolerance Patient tolerated treatment well    Behavior During Therapy [facility] for tasks assessed/performed          Past Medical History:  Diagnosis Date   Breast cancer (HCC)    Coronary artery disease    Diabetes mellitus    GERD (gastroesophageal reflux disease)    Hypertension    Myocardial abscess    Myocardial infarct (HCC) 06/24/2012   Pulmonary hypertension (HCC)    Past Surgical History:  Procedure Laterality Date   ANTERIOR VITRECTOMY Left 07/21/2019   Procedure: ANTERIOR VITRECTOMY;  Surgeon: Harrie Agent, MD;  Location: [facility] ORS;  Service: Ophthalmology;  Laterality: Left;   ANTERIOR VITRECTOMY Right 08/04/2019   Procedure: ANTERIOR VITRECTOMY;  Surgeon: Harrie Agent, MD;  Location: [facility] ORS;  Service: Ophthalmology;  Laterality: Right;   CARDIAC CATHETERIZATION     CARDIOVERSION N/A 09/29/2022   Procedure: CARDIOVERSION;  Surgeon: Rolan Ezra RAMAN, MD;  Location: [facility] INVASIVE CV LAB;  Service: Cardiovascular;  Laterality: N/A;   CARDIOVERSION N/A 10/05/2022   Procedure: CARDIOVERSION;  Surgeon: Cherrie Toribio SAUNDERS, MD;  Location: [facility] INVASIVE CV LAB;  Service: Cardiovascular;  Laterality: N/A;   CATARACT EXTRACTION W/PHACO Left 07/21/2019   Procedure: CATARACT EXTRACTION PHACO AND INTRAOCULAR LENS PLACEMENT (IOC) (CDE: 11.45);  Surgeon: Harrie Agent, MD;  Location: [facility] ORS;  Service: Ophthalmology;  Laterality:  Left;   CATARACT EXTRACTION W/PHACO Right 08/04/2019   Procedure: CATARACT EXTRACTION PHACO AND INTRAOCULAR LENS PLACEMENT (IOC);  Surgeon: Harrie Agent, MD;  Location: [facility] ORS;  Service: Ophthalmology;  Laterality: Right;  CDE: 9.78   CESAREAN SECTION     COLONOSCOPY WITH PROPOFOL  N/A 05/22/2022   Procedure: COLONOSCOPY WITH PROPOFOL ;  Surgeon: Cindie Carlin POUR, DO;  Location: [facility] ENDO SUITE;  Service: Endoscopy;  Laterality: N/A;  10:00 am   CORONARY STENT PLACEMENT N/A 06/24/2012   LEFT HEART CATHETERIZATION WITH CORONARY ANGIOGRAM N/A 06/24/2012   Procedure: LEFT HEART CATHETERIZATION WITH CORONARY ANGIOGRAM;  Surgeon: Toribio SAUNDERS Cherrie, MD;  Location: [facility] CATH LAB;  Service: Cardiovascular;  Laterality: N/A;   right breast lumpectomy     RIGHT HEART CATH N/A 09/28/2022   Procedure: RIGHT HEART CATH;  Surgeon: Rolan Ezra RAMAN, MD;  Location: [facility] INVASIVE CV LAB;  Service: Cardiovascular;  Laterality: N/A;   RIGHT HEART CATH N/A 10/31/2022   Procedure: RIGHT HEART CATH;  Surgeon: Gardenia Led, DO;  Location: [facility] INVASIVE CV LAB;  Service: Cardiovascular;  Laterality: N/A;   RIGHT HEART CATH N/A 01/05/2023   Procedure: RIGHT HEART CATH;  Surgeon: Gardenia Led, DO;  Location: [facility] INVASIVE CV LAB;  Service: Cardiovascular;  Laterality: N/A;   RIGHT/LEFT HEART CATH AND CORONARY ANGIOGRAPHY N/A 02/07/2022   Procedure: RIGHT/LEFT HEART CATH AND CORONARY ANGIOGRAPHY;  Surgeon: Dann Candyce RAMAN, MD;  Location: [facility] INVASIVE CV LAB;  Service: Cardiovascular;  Laterality: N/A;   TEE WITHOUT CARDIOVERSION N/A 09/29/2022   Procedure: TRANSESOPHAGEAL ECHOCARDIOGRAM;  Surgeon: Rolan Ezra RAMAN, MD;  Location: [facility] INVASIVE CV LAB;  Service: Cardiovascular;  Laterality: N/A;   Patient Active Problem List   Diagnosis Date Noted   Diabetic peripheral neuropathy (HCC) 02/12/2023   Lymphedema of lower extremity 02/12/2023   Chronic respiratory failure with hypoxia (HCC) 01/19/2023   RVF (right  ventricular failure) (HCC) 09/28/2022   Persistent atrial fibrillation (HCC) 09/26/2022   Glaucoma (increased eye pressure) 09/26/2022   OSA on CPAP 09/20/2022   Stage 4 chronic kidney disease (HCC) 09/09/2022   Acute on chronic diastolic CHF (congestive heart failure) (HCC) 09/08/2022   Hypoglycemia 09/07/2022   Pulmonary hypertension (HCC) 06/05/2022   Solitary pulmonary nodule on lung CT 06/05/2022   Rectal mass 05/16/2022   DOE (dyspnea on exertion)    Urticaria 10/29/2020   Adenomatous polyp of cecum 09/07/2020   History of radiation therapy 06/22/2020   Aromatase inhibitor use 02/04/2020   Osteopenia of multiple sites 02/04/2020   Breast cancer (HCC) 12/12/2019   Pain due to onychomycosis of toenails of both feet 12/12/2019   Pincer nail deformity 12/12/2019   Malignant neoplasm of upper-outer quadrant of right breast in female, estrogen receptor positive (HCC) 11/12/2019   Non-ST elevation (NSTEMI) myocardial infarction (HCC) 06/25/2012   Atherosclerotic heart disease of native coronary artery without angina pectoris 06/25/2012   Essential (primary) hypertension 06/25/2012   Type 2 diabetes mellitus without complications (HCC) 06/25/2012   Hyperlipidemia 06/25/2012   Normocytic anemia 06/25/2012   Class 3 obesity 06/25/2012   Type 2 diabetes mellitus with hyperglycemia (HCC) 06/25/2012    PCP: Teresa Jenkins Jansky, FNP REFERRING PROVIDER: Teresa Jenkins Jansky, FNP  ONSET DATE: ~6 months  REFERRING DIAG: M25.512 (ICD-10-CM) - Pain in left shoulder   THERAPY DIAG:  Right shoulder pain, unspecified chronicity  Shoulder stiffness, right  Other symptoms and signs involving the musculoskeletal system  Rationale for Evaluation and Treatment: Rehabilitation  SUBJECTIVE:   SUBJECTIVE STATEMENT: S: I still can't reach behind my back without pain  PERTINENT HISTORY: No PMH.  PRECAUTIONS: None  WEIGHT BEARING RESTRICTIONS: No  PAIN:  Are you having pain? No- pulling  sensation with movements  FALLS: Has patient fallen in last 6 months? Yes. Number of falls 1  PLOF: Independent  PATIENT GOALS: Improve mobility  NEXT MD VISIT: None Scheduled  OBJECTIVE:   HAND DOMINANCE: Right  ADLs: Overall ADLs: Pt unable to reach overhead or behind back, limiting dressing and bathing. Pain with attempting to lift anything with weight to it.   FUNCTIONAL OUTCOME MEASURES: Quick Dash: 40.91  UPPER EXTREMITY ROM:       Assessed in seated, er/IR adducted  Active ROM Left eval  Shoulder flexion 139  Shoulder abduction 127  Shoulder internal rotation 90  Shoulder external rotation 12  (Blank rows = not tested)    UPPER EXTREMITY MMT:     Assessed in seated, er/IR adducted  MMT Left eval  Shoulder flexion 4/5  Shoulder abduction 4-/5  Shoulder internal rotation 4-/5  Shoulder external rotation 3+/5  (Blank rows = not tested)  SENSATION: WFL  EDEMA: No swelling noted  OBSERVATIONS: Moderate fascial restrictions along biceps, scapula, trapezius   TODAY'S TREATMENT:  DATE:  01/23/24 -Manual Therapy: myofascial release and trigger point applied to biceps, deltoid, scapular region, and trapezius in order to reduce fascial restrictions and pain, as well as improve ROM. -A/ROM: standing, flexion, abduction, protraction, horizontal abduction, er/IR, x15 -X to V arms, x15 -Goal Post arms, x15 -Stretching: flexion, corner stretch, er door stretch, Towel behind the back stretch, 4x15 -ABC's on the wall, green ball  01/17/24 -Manual Therapy: myofascial release and trigger point applied to biceps, deltoid, scapular region, and trapezius in order to reduce fascial restrictions and pain, as well as improve ROM. -Strengthening: 1#, supine-protraction, flexion, horizontal abduction, er, abduction, 10 reps -Proximal Shoulder  Exercises: supine, 1#- paddles, circles both directions, criss cross, 10 reps each -A/ROM: standing- flexion, abduction, protraction, horizontal abduction, er/IR, 10 reps -X to V arms: 10 reps -Proximal Shoulder Exercises: standing- paddles, circles both directions, criss cross, 10 reps each -Scapular Strengthening: green band, extension, retraction, rows, 10 reps -Overhead lacing: seated, lacing from top down then reversing -Ball on wall: 1' flexion -Functional reaching: pt wearing 1# weight, removing items from middle shelf of overhead cabinet and placing on countertop, putting items from bottom shelf on the middle shelf, then replacing the countertop items on the bottom shelf -UBE: level 2, 2' forward, 2' reverse, pace: 10.5  01/16/24 -Manual Therapy: myofascial release and trigger point applied to biceps, deltoid, scapular region, and trapezius in order to reduce fascial restrictions and pain, as well as improve ROM. -A/ROM: seated, flexion, abduction, protraction, horizontal abduction, er/IR, x15 -X to V arms, x15 -Goal Post arms, x15 -Proximal Shoulder Exercises: 1# paddles, circles both directions, criss cross, x15 each -Shoulder Strengthening: red band, flexion, abduction, er, IR, protraction, horizontal abduction, x12 -Scapular Strengthening: green band, extension, retraction, rows, x12 -Overhead Lacing 1# wrist weight    PATIENT EDUCATION: Education details: Social worker Person educated: Patient Education method: Programmer, multimedia, Facilities manager, and Handouts Education comprehension: verbalized understanding and returned demonstration  HOME EXERCISE PROGRAM: 8/18: A/ROM 8/21: Scapular Strengthening 8/26: Shoulder Strengthening w/ dumbbell 8/28: PNF Strengthening 9/3: Shoulder Strengthening with theraband-red 9/10: Stretches  GOALS: Goals reviewed with patient? Yes   SHORT TERM GOALS: Target date: 02/01/24  Pt will be provided with and educated on HEP to improve mobility in RUE  required for use during ADL completion.   Goal status: IN PROGRESS  LONG TERM GOALS: Target date: 02/01/24  Pt will decrease pain in RUE to 3/10 or less to improve ability to sleep for 2+ consecutive hours without waking due to pain.   Goal status: IN PROGRESS  2.  Pt will decrease RUE fascial restrictions to min amounts or less to improve mobility required for functional reaching tasks.   Goal status: IN PROGRESS  3.  Pt will increase RUE A/ROM by 20 degrees to improve ability to use RUE when reaching overhead or behind back during dressing and bathing tasks.   Goal status: IN PROGRESS  4.  Pt will increase RUE strength to 5/5 or greater to improve ability to use RUE when lifting or carrying items during meal preparation/housework/yardwork tasks.   Goal status: IN PROGRESS  5.  Pt will return to highest level of function using RUE as non-dominant during functional task completion.   Goal status: IN PROGRESS   ASSESSMENT:  CLINICAL IMPRESSION: This session pt presents with improved ROM to functional limits, as well as fair to good strength. She is having increased tightness with extension and external rotation, which OT addressed with stretching. Verbal and tactile cuing provided for positioning and  technique throughout session.   PERFORMANCE DEFICITS: in functional skills including in functional skills including ADLs, IADLs, coordination, tone, ROM, strength, pain, fascial restrictions, muscle spasms, and UE functional use.  PLAN:  OT FREQUENCY: 2x/week  OT DURATION: 4 weeks  PLANNED INTERVENTIONS: 97168 OT Re-evaluation, 97535 self care/ADL training, 02889 therapeutic exercise, 97530 therapeutic activity, 97112 neuromuscular re-education, 97140 manual therapy, 97035 ultrasound, 97010 moist heat, 97032 electrical stimulation (manual), passive range of motion, functional mobility training, energy conservation, coping strategies training, patient/family education, and DME and/or  AE instructions  RECOMMENDED OTHER SERVICES: N/A  CONSULTED AND AGREED WITH PLAN OF CARE: Patient  PLAN FOR NEXT SESSION: Manual Therapy, A/ROM, stretching, strengthening   Valentin Nightingale, OTR/L [phone] 01/23/2024, 1:17 PM

## 2024-01-25 ENCOUNTER — Encounter (HOSPITAL_COMMUNITY): Payer: Self-pay | Admitting: Occupational Therapy

## 2024-01-25 ENCOUNTER — Ambulatory Visit (HOSPITAL_COMMUNITY): Admitting: Occupational Therapy

## 2024-01-25 DIAGNOSIS — M25511 Pain in right shoulder: Secondary | ICD-10-CM | POA: Diagnosis not present

## 2024-01-25 DIAGNOSIS — M25611 Stiffness of right shoulder, not elsewhere classified: Secondary | ICD-10-CM

## 2024-01-25 DIAGNOSIS — R29898 Other symptoms and signs involving the musculoskeletal system: Secondary | ICD-10-CM

## 2024-01-25 NOTE — Therapy (Signed)
 OUTPATIENT OCCUPATIONAL THERAPY ORTHO TREATMENT NOTE DISCHARGE NOTE  Patient Name: Sandra Schneider MRN: [id number] DOB:1955/12/02, 68 y.o., female Today's Date: 01/25/2024  OCCUPATIONAL THERAPY DISCHARGE SUMMARY  Visits from Start of Care: 8  Current functional level related to goals / functional outcomes: Pt has met all OT goals. Pain is minimal, as well as fascial restrictions. Pt using her arm functionally and in exercise classes daily.    Remaining deficits: Pt has mild weakness and ROM limits in external rotation.    Education / Equipment: Pt provided a comprehensive HEP.   Plan: Patient agrees to discharge as all OT goals have been met.      END OF SESSION:  OT End of Session - 01/25/24 0846     Visit Number 8    Number of Visits 8    Date for OT Re-Evaluation 02/01/24    Authorization Type [employer] Medicare    Authorization Time Period [employer] approved 8 visits 12/31/23-01/28/24    Authorization - Visit Number 6    Authorization - Number of Visits 8    OT Start Time 0804    OT Stop Time 0836    OT Time Calculation (min) 32 min    Activity Tolerance Patient tolerated treatment well    Behavior During Therapy [facility] for tasks assessed/performed           Past Medical History:  Diagnosis Date   Breast cancer (HCC)    Coronary artery disease    Diabetes mellitus    GERD (gastroesophageal reflux disease)    Hypertension    Myocardial abscess    Myocardial infarct (HCC) 06/24/2012   Pulmonary hypertension (HCC)    Past Surgical History:  Procedure Laterality Date   ANTERIOR VITRECTOMY Left 07/21/2019   Procedure: ANTERIOR VITRECTOMY;  Surgeon: Harrie Agent, MD;  Location: [facility];  Service: Ophthalmology;  Laterality: Left;   ANTERIOR VITRECTOMY Right 08/04/2019   Procedure: ANTERIOR VITRECTOMY;  Surgeon: Harrie Agent, MD;  Location: [facility];  Service: Ophthalmology;  Laterality: Right;   CARDIAC CATHETERIZATION     CARDIOVERSION N/A 09/29/2022   Procedure:  CARDIOVERSION;  Surgeon: Rolan Ezra RAMAN, MD;  Location: [facility] INVASIVE CV LAB;  Service: Cardiovascular;  Laterality: N/A;   CARDIOVERSION N/A 10/05/2022   Procedure: CARDIOVERSION;  Surgeon: Cherrie Toribio SAUNDERS, MD;  Location: [facility] INVASIVE CV LAB;  Service: Cardiovascular;  Laterality: N/A;   CATARACT EXTRACTION W/PHACO Left 07/21/2019   Procedure: CATARACT EXTRACTION PHACO AND INTRAOCULAR LENS PLACEMENT (IOC) (CDE: 11.45);  Surgeon: Harrie Agent, MD;  Location: [facility];  Service: Ophthalmology;  Laterality: Left;   CATARACT EXTRACTION W/PHACO Right 08/04/2019   Procedure: CATARACT EXTRACTION PHACO AND INTRAOCULAR LENS PLACEMENT (IOC);  Surgeon: Harrie Agent, MD;  Location: [facility];  Service: Ophthalmology;  Laterality: Right;  CDE: 9.78   CESAREAN SECTION     COLONOSCOPY WITH PROPOFOL  N/A 05/22/2022   Procedure: COLONOSCOPY WITH PROPOFOL ;  Surgeon: Cindie Carlin POUR, DO;  Location: [facility];  Service: Endoscopy;  Laterality: N/A;  10:00 am   CORONARY STENT PLACEMENT N/A 06/24/2012   LEFT HEART CATHETERIZATION WITH CORONARY ANGIOGRAM N/A 06/24/2012   Procedure: LEFT HEART CATHETERIZATION WITH CORONARY ANGIOGRAM;  Surgeon: Toribio SAUNDERS Cherrie, MD;  Location: [facility] CATH LAB;  Service: Cardiovascular;  Laterality: N/A;   right breast lumpectomy     RIGHT HEART CATH N/A 09/28/2022   Procedure: RIGHT HEART CATH;  Surgeon: Rolan Ezra RAMAN, MD;  Location: [facility] INVASIVE CV LAB;  Service: Cardiovascular;  Laterality: N/A;  RIGHT HEART CATH N/A 10/31/2022   Procedure: RIGHT HEART CATH;  Surgeon: Gardenia Led, DO;  Location: [facility] INVASIVE CV LAB;  Service: Cardiovascular;  Laterality: N/A;   RIGHT HEART CATH N/A 01/05/2023   Procedure: RIGHT HEART CATH;  Surgeon: Gardenia Led, DO;  Location: [facility] INVASIVE CV LAB;  Service: Cardiovascular;  Laterality: N/A;   RIGHT/LEFT HEART CATH AND CORONARY ANGIOGRAPHY N/A 02/07/2022   Procedure: RIGHT/LEFT HEART CATH AND CORONARY ANGIOGRAPHY;  Surgeon: Dann Candyce RAMAN, MD;  Location: [facility] INVASIVE CV LAB;  Service: Cardiovascular;  Laterality: N/A;   TEE WITHOUT CARDIOVERSION N/A 09/29/2022   Procedure: TRANSESOPHAGEAL ECHOCARDIOGRAM;  Surgeon: Rolan Ezra RAMAN, MD;  Location: [facility] INVASIVE CV LAB;  Service: Cardiovascular;  Laterality: N/A;   Patient Active Problem List   Diagnosis Date Noted   Diabetic peripheral neuropathy (HCC) 02/12/2023   Lymphedema of lower extremity 02/12/2023   Chronic respiratory failure with hypoxia (HCC) 01/19/2023   RVF (right ventricular failure) (HCC) 09/28/2022   Persistent atrial fibrillation (HCC) 09/26/2022   Glaucoma (increased eye pressure) 09/26/2022   OSA on CPAP 09/20/2022   Stage 4 chronic kidney disease (HCC) 09/09/2022   Acute on chronic diastolic CHF (congestive heart failure) (HCC) 09/08/2022   Hypoglycemia 09/07/2022   Pulmonary hypertension (HCC) 06/05/2022   Solitary pulmonary nodule on lung CT 06/05/2022   Rectal mass 05/16/2022   DOE (dyspnea on exertion)    Urticaria 10/29/2020   Adenomatous polyp of cecum 09/07/2020   History of radiation therapy 06/22/2020   Aromatase inhibitor use 02/04/2020   Osteopenia of multiple sites 02/04/2020   Breast cancer (HCC) 12/12/2019   Pain due to onychomycosis of toenails of both feet 12/12/2019   Pincer nail deformity 12/12/2019   Malignant neoplasm of upper-outer quadrant of right breast in female, estrogen receptor positive (HCC) 11/12/2019   Non-ST elevation (NSTEMI) myocardial infarction (HCC) 06/25/2012   Atherosclerotic heart disease of native coronary artery without angina pectoris 06/25/2012   Essential (primary) hypertension 06/25/2012   Type 2 diabetes mellitus without complications (HCC) 06/25/2012   Hyperlipidemia 06/25/2012   Normocytic anemia 06/25/2012   Class 3 obesity 06/25/2012   Type 2 diabetes mellitus with hyperglycemia (HCC) 06/25/2012    PCP: Teresa Jenkins Jansky, FNP REFERRING PROVIDER: Teresa Jenkins Jansky, FNP  ONSET DATE: ~6  months  REFERRING DIAG: M25.512 (ICD-10-CM) - Pain in left shoulder   THERAPY DIAG:  Right shoulder pain, unspecified chronicity  Shoulder stiffness, right  Other symptoms and signs involving the musculoskeletal system  Rationale for Evaluation and Treatment: Rehabilitation  SUBJECTIVE:   SUBJECTIVE STATEMENT: S: I'm really able to do most things now.  PERTINENT HISTORY: No PMH.  PRECAUTIONS: None  WEIGHT BEARING RESTRICTIONS: No  PAIN:  Are you having pain? No- pulling sensation with movements  FALLS: Has patient fallen in last 6 months? Yes. Number of falls 1  PLOF: Independent  PATIENT GOALS: Improve mobility  NEXT MD VISIT: None Scheduled  OBJECTIVE:   HAND DOMINANCE: Right  ADLs: Overall ADLs: Pt unable to reach overhead or behind back, limiting dressing and bathing. Pain with attempting to lift anything with weight to it.   FUNCTIONAL OUTCOME MEASURES: Quick Dash: 40.91 01/25/24: 9.09  UPPER EXTREMITY ROM:       Assessed in seated, er/IR adducted  Active ROM Left eval Left 01/25/24  Shoulder flexion 139 154  Shoulder abduction 127 139  Shoulder internal rotation 90 90  Shoulder external rotation 12 36  (Blank rows = not tested)    UPPER  EXTREMITY MMT:     Assessed in seated, er/IR adducted  MMT Left eval Left 01/25/24  Shoulder flexion 4/5 5/5  Shoulder abduction 4-/5 4+/5  Shoulder internal rotation 4-/5 5/5  Shoulder external rotation 3+/5 4/5  (Blank rows = not tested)  SENSATION: WFL  EDEMA: No swelling noted  OBSERVATIONS: Moderate fascial restrictions along biceps, scapula, trapezius   TODAY'S TREATMENT:                                                                                                                              DATE:  01/25/24 -Manual Therapy: myofascial release and trigger point applied to biceps, deltoid, scapular region, and trapezius in order to reduce fascial restrictions and pain, as well as improve  ROM. -A/ROM: standing, 2#, flexion, abduction, protraction, horizontal abduction, er/IR, x10 -X to V arms, 1#, x15 -Goal Post arms, 1#, x15 -Reassessment   01/23/24 -Manual Therapy: myofascial release and trigger point applied to biceps, deltoid, scapular region, and trapezius in order to reduce fascial restrictions and pain, as well as improve ROM. -A/ROM: standing, flexion, abduction, protraction, horizontal abduction, er/IR, x15 -X to V arms, x15 -Goal Post arms, x15 -Stretching: flexion, corner stretch, er door stretch, Towel behind the back stretch, 4x15 -ABC's on the wall, green ball  01/17/24 -Manual Therapy: myofascial release and trigger point applied to biceps, deltoid, scapular region, and trapezius in order to reduce fascial restrictions and pain, as well as improve ROM. -Strengthening: 1#, supine-protraction, flexion, horizontal abduction, er, abduction, 10 reps -Proximal Shoulder Exercises: supine, 1#- paddles, circles both directions, criss cross, 10 reps each -A/ROM: standing- flexion, abduction, protraction, horizontal abduction, er/IR, 10 reps -X to V arms: 10 reps -Proximal Shoulder Exercises: standing- paddles, circles both directions, criss cross, 10 reps each -Scapular Strengthening: green band, extension, retraction, rows, 10 reps -Overhead lacing: seated, lacing from top down then reversing -Ball on wall: 1' flexion -Functional reaching: pt wearing 1# weight, removing items from middle shelf of overhead cabinet and placing on countertop, putting items from bottom shelf on the middle shelf, then replacing the countertop items on the bottom shelf -UBE: level 2, 2' forward, 2' reverse, pace: 10.5   PATIENT EDUCATION: Education details: Reviewed HEP Person educated: Patient Education method: Explanation, Demonstration, and Handouts Education comprehension: verbalized understanding and returned demonstration  HOME EXERCISE PROGRAM: 8/18: A/ROM 8/21: Scapular  Strengthening 8/26: Shoulder Strengthening w/ dumbbell 8/28: PNF Strengthening 9/3: Shoulder Strengthening with theraband-red 9/10: Stretches  GOALS: Goals reviewed with patient? Yes   SHORT TERM GOALS: Target date: 02/01/24  Pt will be provided with and educated on HEP to improve mobility in RUE required for use during ADL completion.   Goal status: MET  LONG TERM GOALS: Target date: 02/01/24  Pt will decrease pain in RUE to 3/10 or less to improve ability to sleep for 2+ consecutive hours without waking due to pain.   Goal status: MET  2.  Pt will decrease RUE fascial restrictions  to min amounts or less to improve mobility required for functional reaching tasks.   Goal status: MET  3.  Pt will increase RUE A/ROM by 20 degrees to improve ability to use RUE when reaching overhead or behind back during dressing and bathing tasks.   Goal status: NOT MET - PARTIALLY MET  4.  Pt will increase RUE strength to 5/5 or greater to improve ability to use RUE when lifting or carrying items during meal preparation/housework/yardwork tasks.   Goal status: NOT MET - PARTIALLY MET  5.  Pt will return to highest level of function using RUE as non-dominant during functional task completion.   Goal status: MET   ASSESSMENT:  CLINICAL IMPRESSION: This session pt completed Reassessment for discharge. She has progressed well with ROM and Strength, returning to full functional baseline. Pt feels that she has no limitations at this time and is completing her HEP well. Pt will be discharged as she has no further skilled OT needs.   PERFORMANCE DEFICITS: in functional skills including in functional skills including ADLs, IADLs, coordination, tone, ROM, strength, pain, fascial restrictions, muscle spasms, and UE functional use.  PLAN:  OT FREQUENCY: 2x/week  OT DURATION: 4 weeks  PLANNED INTERVENTIONS: 97168 OT Re-evaluation, 97535 self care/ADL training, 02889 therapeutic exercise, 97530  therapeutic activity, 97112 neuromuscular re-education, 97140 manual therapy, 97035 ultrasound, 97010 moist heat, 97032 electrical stimulation (manual), passive range of motion, functional mobility training, energy conservation, coping strategies training, patient/family education, and DME and/or AE instructions  RECOMMENDED OTHER SERVICES: N/A  CONSULTED AND AGREED WITH PLAN OF CARE: Patient  PLAN FOR NEXT SESSION: Discharge   Valentin Nightingale, OTR/L [phone] 01/25/2024, 8:46 AM

## 2024-01-28 ENCOUNTER — Encounter (HOSPITAL_COMMUNITY): Admitting: Occupational Therapy

## 2024-01-30 ENCOUNTER — Encounter (HOSPITAL_COMMUNITY): Admitting: Occupational Therapy

## 2024-03-05 ENCOUNTER — Ambulatory Visit: Attending: Cardiology | Admitting: Cardiology

## 2024-03-05 ENCOUNTER — Encounter: Payer: Self-pay | Admitting: Cardiology

## 2024-03-05 VITALS — BP 144/62 | HR 66 | Ht 66.0 in | Wt 170.4 lb

## 2024-03-05 DIAGNOSIS — I50810 Right heart failure, unspecified: Secondary | ICD-10-CM | POA: Diagnosis not present

## 2024-03-05 DIAGNOSIS — I272 Pulmonary hypertension, unspecified: Secondary | ICD-10-CM

## 2024-03-05 DIAGNOSIS — I4892 Unspecified atrial flutter: Secondary | ICD-10-CM

## 2024-03-05 DIAGNOSIS — I2510 Atherosclerotic heart disease of native coronary artery without angina pectoris: Secondary | ICD-10-CM

## 2024-03-05 DIAGNOSIS — I10 Essential (primary) hypertension: Secondary | ICD-10-CM | POA: Diagnosis not present

## 2024-03-05 NOTE — Patient Instructions (Addendum)

## 2024-03-05 NOTE — Progress Notes (Signed)
 Clinical Summary Ms. Lafave is a 68 y.o.female seen today for follow up of the following medical problems   1. CAD - history of NSTEMI 06/2012, received DES to prox LAD and DES to mid LCX - 06/2012 echo LVEF 55-60%, grade I DDx 06/2023 echo: LVEF 65-70%, no WMAs, indet diastolic, RV normal systolic function and size, severe pulm HTN PASP 67     - 01/2022 echo LVEF 65-70%, no WMAs, grade I dd, mild RV dysfunction, mod to severely enlarged RV, severe pulm HTN PASP 80, mild mitrla stenosis,  - 01/2022 cath: LAD 25%, LCX 25%, OM2 occluded, RCA LIs   - no SOB/DOE, no chest pains. Compliant with meds   2. Pulmonary HTN/RV failure - 01/2022 echo LVEF 65-70%, no WMAs, grade I dd, mild RV dysfunction, mod to severely enlarged RV, severe pulm HTN PASP 80, mild mitrla stenosis,    01/2022 RHC/LHC:  No significant CAD. Mean PA 69, PCWP pulmonary capillary wedge pressure difficult to assess due to significant V waves, and difficulty wedging.LVEDP 16   02/2022 VQ scan: no PE CT chest: 0.9 cm apical RUL nodule, Nonspecific mild right paratracheal and right subcarinal lymphadenopathy. Primary bronchogenic malignancy with nodal metastatic disease not excluded. Suggest PET-CT for further characterization of these findings. PFTs mod restriction OSA: severe OSA on sleep study, followed by Dr Jude   - followed in HF clinic, from notes combined group I and III pulmonary HTN - currently on uptravi , sildenafil , macitentan  - no SOB/DOE, no LE edema  - denies SOB/DOE. Water aerobics twice a week, walks 2 miles on the weekends. Does some light weights at the [facility] without symptoms.  - 06/2023 echo: LVEF 65-70%, indet diastolic, RV normal function, severe pulm HTN 67.3   3.Thyroid  nodule       4. HTN - compliant with meds       5. Hyperlipidemia - she is on atorvastatin  40mg  daily  - 02/2023 TC 103 TG 98 HDL 61 LDL 22 - upcoming labs with pcp     6. Breast cancer - recent lumpectomy -  reports just being monitored at this time.    7. Varicose veins - followed by vascular   8. OSA - followed by pulmonary - compliant with cpap   9. Aflutter - new diagnosis during 09/2022 admission - s/p DCCV 09/2022 - has been on amiodarone     - no palpitations - no bleeeing on eliquis    SH: son is Ozell Daring, patient here Working security.  Past Medical History:  Diagnosis Date   Breast cancer (HCC)    Coronary artery disease    Diabetes mellitus    GERD (gastroesophageal reflux disease)    Hypertension    Myocardial abscess    Myocardial infarct (HCC) 06/24/2012   Pulmonary hypertension (HCC)      No Known Allergies   Current Outpatient Medications  Medication Sig Dispense Refill   acetaminophen  (TYLENOL ) 325 MG tablet Take 650 mg by mouth every 6 (six) hours as needed for moderate pain.     amiodarone  (PACERONE ) 200 MG tablet Take 1 tablet by mouth once daily 60 tablet 11   amLODipine  (NORVASC ) 5 MG tablet Take 1 tablet by mouth once daily 90 tablet 3   apixaban  (ELIQUIS ) 5 MG TABS tablet Take 1 tablet by mouth twice daily 180 tablet 1   Ascorbic Acid (VITAMIN C PO) Take 1 tablet by mouth daily.     atorvastatin  (LIPITOR ) 40 MG tablet Take 1 tablet by  mouth once daily 90 tablet 3   Blood Pressure Monitor DEVI 1 each by Does not apply route daily. 1 each 0   Carboxymethylcellul-Glycerin (REFRESH OPTIVE) 1-0.9 % GEL Place 1 drop into both eyes as needed (dry eyes).     cetirizine (ZYRTEC) 10 MG tablet Take 10 mg by mouth daily.     Cholecalciferol (VITAMIN D) 50 MCG (2000 UT) tablet Take 2,000 Units by mouth daily.     famotidine  (PEPCID ) 20 MG tablet Take 20 mg by mouth daily.     letrozole (FEMARA) 2.5 MG tablet Take 2.5 mg by mouth daily.     macitentan  (OPSUMIT ) 10 MG tablet Take 1 tablet (10 mg total) by mouth daily. 90 tablet 3   Multiple Vitamin (MULTIVITAMIN) tablet Take 1 tablet by mouth daily.     OXYGEN  Inhale 4 L into the lungs continuous.      Polyethyl Glycol-Propyl Glycol (SYSTANE) 0.4-0.3 % SOLN Place 1 drop into both eyes as needed (dry eyes).     potassium chloride  (KLOR-CON ) 10 MEQ tablet Take 2 tablets (20 mEq total) by mouth daily. 60 tablet 6   prednisoLONE acetate (PRED FORTE) 1 % ophthalmic suspension Place 1 drop into both eyes in the morning and at bedtime.     Selexipag  (UPTRAVI ) 1600 MCG TABS Take 1 tablet (1,600 mcg total) by mouth 2 (two) times daily. 60 tablet 11   sildenafil  (REVATIO ) 20 MG tablet TAKE 2 TABLETS BY MOUTH THREE TIMES DAILY 180 tablet 3   tirzepatide  (MOUNJARO ) 15 MG/0.5ML Pen INJECT 15 MG UNDER THE SKIN ONCE A WEEK 6 mL 0   torsemide  (DEMADEX ) 20 MG tablet TAKE 40MG  IN THE MORNING, AND 20MG  IN THE EVENING 120 tablet 6   traMADol (ULTRAM) 50 MG tablet Take 50 mg by mouth 4 (four) times daily as needed.     No current facility-administered medications for this visit.     Past Surgical History:  Procedure Laterality Date   ANTERIOR VITRECTOMY Left 07/21/2019   Procedure: ANTERIOR VITRECTOMY;  Surgeon: Harrie Agent, MD;  Location: [facility];  Service: Ophthalmology;  Laterality: Left;   ANTERIOR VITRECTOMY Right 08/04/2019   Procedure: ANTERIOR VITRECTOMY;  Surgeon: Harrie Agent, MD;  Location: [facility];  Service: Ophthalmology;  Laterality: Right;   CARDIAC CATHETERIZATION     CARDIOVERSION N/A 09/29/2022   Procedure: CARDIOVERSION;  Surgeon: Rolan Ezra RAMAN, MD;  Location: [facility] INVASIVE CV LAB;  Service: Cardiovascular;  Laterality: N/A;   CARDIOVERSION N/A 10/05/2022   Procedure: CARDIOVERSION;  Surgeon: Cherrie Toribio SAUNDERS, MD;  Location: [facility] INVASIVE CV LAB;  Service: Cardiovascular;  Laterality: N/A;   CATARACT EXTRACTION W/PHACO Left 07/21/2019   Procedure: CATARACT EXTRACTION PHACO AND INTRAOCULAR LENS PLACEMENT (IOC) (CDE: 11.45);  Surgeon: Harrie Agent, MD;  Location: [facility];  Service: Ophthalmology;  Laterality: Left;   CATARACT EXTRACTION W/PHACO Right 08/04/2019   Procedure: CATARACT  EXTRACTION PHACO AND INTRAOCULAR LENS PLACEMENT (IOC);  Surgeon: Harrie Agent, MD;  Location: [facility];  Service: Ophthalmology;  Laterality: Right;  CDE: 9.78   CESAREAN SECTION     COLONOSCOPY WITH PROPOFOL  N/A 05/22/2022   Procedure: COLONOSCOPY WITH PROPOFOL ;  Surgeon: Cindie Carlin POUR, DO;  Location: [facility];  Service: Endoscopy;  Laterality: N/A;  10:00 am   CORONARY STENT PLACEMENT N/A 06/24/2012   LEFT HEART CATHETERIZATION WITH CORONARY ANGIOGRAM N/A 06/24/2012   Procedure: LEFT HEART CATHETERIZATION WITH CORONARY ANGIOGRAM;  Surgeon: Toribio SAUNDERS Cherrie, MD;  Location: [facility] CATH LAB;  Service: Cardiovascular;  Laterality: N/A;   right breast lumpectomy     RIGHT HEART CATH N/A 09/28/2022   Procedure: RIGHT HEART CATH;  Surgeon: Rolan Ezra RAMAN, MD;  Location: [facility] INVASIVE CV LAB;  Service: Cardiovascular;  Laterality: N/A;   RIGHT HEART CATH N/A 10/31/2022   Procedure: RIGHT HEART CATH;  Surgeon: Gardenia Led, DO;  Location: [facility] INVASIVE CV LAB;  Service: Cardiovascular;  Laterality: N/A;   RIGHT HEART CATH N/A 01/05/2023   Procedure: RIGHT HEART CATH;  Surgeon: Gardenia Led, DO;  Location: [facility] INVASIVE CV LAB;  Service: Cardiovascular;  Laterality: N/A;   RIGHT/LEFT HEART CATH AND CORONARY ANGIOGRAPHY N/A 02/07/2022   Procedure: RIGHT/LEFT HEART CATH AND CORONARY ANGIOGRAPHY;  Surgeon: Dann Candyce RAMAN, MD;  Location: [facility] INVASIVE CV LAB;  Service: Cardiovascular;  Laterality: N/A;   TEE WITHOUT CARDIOVERSION N/A 09/29/2022   Procedure: TRANSESOPHAGEAL ECHOCARDIOGRAM;  Surgeon: Rolan Ezra RAMAN, MD;  Location: [facility] INVASIVE CV LAB;  Service: Cardiovascular;  Laterality: N/A;     No Known Allergies    Family History  Problem Relation Age of Onset   Heart disease Father    Diabetes Other    Hypertension Other    Cancer Other    Colon cancer Son 22       alive, doing well.     Social History Ms. Kerins reports that she has never smoked. She has never been  exposed to tobacco smoke. She has never used smokeless tobacco. Ms. Vallely reports no history of alcohol use.     Physical Examination Today's Vitals   03/05/24 1418 03/05/24 1450  BP: (!) 140/60 (!) 144/62  Pulse: 66   SpO2: 97%   Weight: 170 lb 6.4 oz (77.3 kg)   Height: 5' 6 (1.676 m)    Body mass index is 27.5 kg/m.  Gen: resting comfortably, no acute distress HEENT: no scleral icterus, pupils equal round and reactive, no palptable cervical adenopathy,  CV: RRR, no mrg, no jvd Resp: Clear to auscultation bilaterally GI: abdomen is soft, non-tender, non-distended, normal bowel sounds, no hepatosplenomegaly MSK: extremities are warm, no edema.  Skin: warm, no rash Neuro:  no focal deficits Psych: appropriate affect   Diagnostic Studies  06/2012 echo Study Conclusions   - Left ventricle: The cavity size was normal. Wall thickness    was normal. Systolic function was normal. The estimated    ejection fraction was in the range of 55% to 60%. Wall    motion was normal; there were no regional wall motion    abnormalities. Doppler parameters are consistent with    abnormal left ventricular relaxation (grade 1 diastolic    dysfunction). Doppler parameters are consistent with high    ventricular filling pressure.  - Mitral valve: Calcified annulus.  - Left atrium: The atrium was mildly dilated.  - Atrial septum: There was increased thickness of the    septum, consistent with lipomatous hypertrophy.      06/2012 cath     Findings:   Ao Pressure: 125/59 (83) LV Pressure:  137/11/20 There was no signficant gradient across the aortic valve on pullback.   Left main: Short. Normal   LAD: Large vessel with proximal 80% lesion. Mid vessel 30%. Distal vessel has diffuse moderate disease.    LCX: Large vessel gives off 3 OMs. 99% midsection followed by 40%. Distal vessel is small with 70% lesion.    RCA: Dominant. Normal.    LV-gram done in the RAO projection:  Ejection fraction = 60%. No regional wall  motion abnormalities.    Assessment: 1. 2V CAD 2. Normal LV function 3. NSTEMI   Plan/Discussion:   Plan PCI of LCX & LAD.     PCI Data: Vessel - proximal and mid left circumflex/Segment - proximal and mid Percent Stenosis (pre)  95% TIMI-flow 3 Stent 3.0 x 32 mm Promus drug-eluting stent postdilated with a 3.5 noncompliant balloon. Percent Stenosis (post) 0% TIMI-flow (post) 3     Vessel - LAD/Segment - proximal  Percent Stenosis (pre)  85% TIMI-flow 3 Stent 3.0 x 20 mm Promus drug-eluting stent postdilated with a 3.5 noncompliant balloon. Percent Stenosis (post) 0% TIMI-flow (post) 3   Final Conclusions:   Successful angioplasty and drug-eluting stent placement to the proximal/mid left circumflex as well as proximal left anterior descending artery.    Recommendations:  Recommend dual antiplatelet therapy for at least one year. Aggressive treatment of risk factors is recommended.     01/2022 echo 1. Left ventricular ejection fraction, by estimation, is 65 to 70%. The  left ventricle has normal function. The left ventricle has no regional  wall motion abnormalities. Left ventricular diastolic parameters are  consistent with Grade I diastolic  dysfunction (impaired relaxation).   2. Right ventricular systolic function is mildly reduced. The right  ventricular size is moderately to severely enlarged. There is severely  elevated pulmonary artery systolic pressure.   3. Left atrial size was mildly dilated.   4. Right atrial size was mildly dilated.   5. The mitral valve is abnormal. No evidence of mitral valve  regurgitation. Mild mitral stenosis. Moderate mitral annular  calcification.   6. The tricuspid valve is abnormal. Tricuspid valve regurgitation is mild  to moderate.   7. The aortic valve has an indeterminant number of cusps. Aortic valve  regurgitation is not visualized. No aortic stenosis is present.   8. The inferior  vena cava is dilated in size with >50% respiratory  variability, suggesting right atrial pressure of 8 mmHg.    01/2022 RHC/LHC  Prox Cx to Dist Cx lesion is 25% stenosed.   Mid LAD lesion is 25% stenosed.   2nd Mrg lesion is 100% stenosed.  Right to left collaterals.  Left to left collaterals.   The left ventricular systolic function is normal.   LV end diastolic pressure is mildly elevated.   The left ventricular ejection fraction is greater than 65% by visual estimate.   Hemodynamic findings consistent with severe pulmonary hypertension.   There is no aortic valve stenosis.   Aortic saturation 96% on 3 L nasal cannula, PA saturation 69% on 3 L, mean RA pressure 32 mm Hg; PA pressure 107/48, mean PA pressure 69 mmHg, pulmonary capillary wedge pressure difficult to assess due to significant V waves, and difficulty wedging.  Cardiac output 7.03 L/min, cardiac index 3.07.   Patent stents with mild restenosis in the LAD and circumflex.  Small obtuse marginal appears occluded distally with some right to left collaterals.  No target for PCI.   Most likely her symptoms are coming from her severe pulmonary artery hypertension.  She states she will be starting CPAP therapy for obstructive sleep apnea soon.   07/2022 echo 1. Left ventricular ejection fraction, by estimation, is 60 to 65%. The  left ventricle has normal function. The left ventricle has no regional  wall motion abnormalities. Left ventricular diastolic parameters are  consistent with Grade I diastolic  dysfunction (impaired relaxation).   2. Right ventricular systolic function is severely reduced. The right  ventricular  size is severely enlarged. There is severely elevated  pulmonary artery systolic pressure.   3. Left atrial size was mildly dilated.   4. Right atrial size was moderately dilated.   5. The mitral valve is abnormal. Trivial mitral valve regurgitation. No  evidence of mitral stenosis. Severe mitral annular  calcification.   6. Tricuspid valve regurgitation is severe.   7. The aortic valve is tricuspid. There is mild calcification of the  aortic valve. Aortic valve regurgitation is not visualized. Aortic valve  sclerosis is present, with no evidence of aortic valve stenosis.   8. The inferior vena cava is dilated in size with <50% respiratory  variability, suggesting right atrial pressure of 15 mmHg.   9. Agitated saline contrast bubble study was negative, with no evidence  of any interatrial shunt.    Assessment and Plan  1.Pulmonary HTN/RV failure - followed by HF clinic, she is on sildenafil , selexipag , macitentan  - continues to do well, continue to follow with HF clinic.    2. CAD - no recent symptoms, continue current meds   3. Hyperlipidemia - lipids at goal, has upcomnig labs with pcp. Continue current meds   4. HTN - bp elevated but home numbers and bp from several other recent clinic visits at goal - continue current meds   5. Aflutter/acquired thrombophilia -no symptoms, continue current meds     6. Carotid stenosis - mild by 2021 US , bilateral bruits on exam, repeat carotid US  including eliquis  for stroke prevention        Dorn PHEBE Ross, M.D.

## 2024-03-18 NOTE — Progress Notes (Signed)
 Advanced Heart Failure Clinic Note   Referring Physician: [facility], Raynaldo Haws*  Primary Care: [facility], [facility] Primary Cardiologist: Dr. Dorn Ross  CC: Pulmonary Hypertension   HPI:  Sandra Schneider is a 68 y.o. female with CAD (NSTEMI 06/2012 w/ PCI to LAD and Lcx) LVEF 55% at that time, OSA, HTN, hyperlipidemia, hx of breast ca s/p lumpectomy and persistent shortness of breath and lower extremity edema presenting today for follow up. She also follows Dr. Alva for OSA.  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  She has noticed that over the past several months she has had worsening lower extremity edema and difficulty ambulating due to shortness of breath.  She had a echocardiogram concerning for pulmonary hypertension followed by right heart cath with a mean PA of 69 mmHg.  Her cardiac output was 7 L/min during that case.  She had a workup for interstitial lung disease by pulmonology with CT chest that was negative. However there was a right upper lobe lung nodule with negative PET scan. In addition she has had a colonoscopy and mammogram due to hypermetabolic areas in the colon and breast both of which were also negative.    Since her initial appointment we started her on sildenafil  20 mg 3 times daily. Unfortunately due to continued dietary indiscretion, significant volume intake and medication noncompliance she presented severely hypervolemic during her last appointment in May 2024. She was directly admitted to the hospital and diuresed 35 pounds during that admission. Right heart cath confirmed severe precapillary pulmonary hypertension. Sildenafil  was increased to 40 mg 3 times daily. Now on triple therapy with uptravi , sildenafil  and opsumit .   At last visit with MD on 01/18/24 she was doing remarkably well from a functional standpoint. She was compliant with all medications and watching her sodium intake. Denied difficulty  with ADLs. Limited by some arthritis, but otherwise NYHA IIb from a pulmonary hypertension standpoint. Continued to use 4 L oxygen  at night and intermittently throughout the day.  Today she returns to HF clinic for pharmacist medication titration. At last visit with MD, no medication changes were made. Today she reports overall she has been feeling great. Denies dizziness/lightheadedness, chest pain, and palpitations. Denies SOB and is currently not using oxygen  during the day and home O2 sat is 96-98%. Uses CPAP at night. She is very active and completes fitness classes on Mon/Wed/Fri and water aerobics on Tues/Thurs then walks for an hour on Saturdays. Reports no SOB with these activities and does not need to stop to rest. No LEE on exam. Denies PND/orthopnea. Reports monitoring her weight at home which has been 168-170 lbs. Reports her appetite has been good, watching sodium and sweets. BP at home has been 120-130s/50s-60s. Occasionally SBP up to 140s.   HF Medications: Sildenafil  40 mg TID Opsumit  10 mg daily  Uptravi  1600 mcg BID   Has the patient been experiencing any side effects to the medications prescribed?  No  Does the patient have any problems obtaining medications due to transportation or finances?   No  Understanding of regimen: good Understanding of indications: good Potential of compliance: good Patient understands to avoid NSAIDs. Patient understands to avoid decongestants.  Pertinent Lab Values: 01/18/24: Serum creatinine 1.81, BUN 26, Potassium 4.8, Sodium 138, BNP 49.5   Vital Signs: Weight: 168.8 lbs (last clinic weight: 167.8 lbs) Blood pressure: 140/58  Heart rate: 65   Assessment/Plan: Pulmonary hypertension, Group I + III - Initial RHC  waveforms reviewed personally. RA 30, RV 107/20-30, PA 107/48 (69), unable to wedge; LVEDP 15. AO 140/64. LVEDP ~16. Assuming a PCWP of 20, TPG would be 49 w/ PVR of 7.  - Hemodynamics consistent with severe pulmonary hypertension  largely due to pre-capillary PH.  -Cardiac MRI in 5/24 with EF 75%, moderate RV dilation with RV EF 50%, moderate TR, mid-wall basal septal/basal inferolateral LGE. Could be seen with cardiac sarcoidosis (vs prior myocarditis). CT chest showed RUL nodule (negative on PET) and mediastinal LAN. V/Q scan not suggestive of chronic PEs. RHC 5/16 showed severe PAH with low CI 1.73, PAPi 1.5, RA pressure 32 (R>>L heart failure). TEE this admission with LV EF 60-65%, D-shaped septum, moderate RV dilation with moderate RV dysfunction, mod-severe TR. Likely end-stage PH/cor pulmonale d/t OHS/OSA and group 1 PAH.  - Labs: ANA, HCV negative. HIV pending. LFTs mostly unremarkable.  - PFTs w/ moderate restriction.  - CT chest w/ pulmonary nodules, however, follow up PET mostly unremarkable.  - Currently on sildenafil  40 mg TID, Opsumit  & Uptravi . She is tolerating medications well with no adverse effects. Discussed with HF pharmacist, Nason, and will increase sildenafil  to 80 mg TID. - Participating in pulmonary rehab   -Repeated a 6-minute walk at last visit. She ambulated 426.72 m on no oxygen . Her O2 sat remained 99 to 96% with a heart rate in the mid 70s. Discussed that she did not need 24 hour oxygen  and nighttime oximetry was ordered. Now off oxygen  and using CPAP at night. - Continue torsemide  40/20. - CMET and CBC today   2. OSA - followed by pulmonology.  - Now using CPAP nightly   3. CAD - LHC as above - followed by general cardiology.  - no chest pain.    4. Severe obesity, resolved.  - significant weight loss with Mounjoro.  - BMI now down to 27.    5. T2DM - Only taking mounjoro  - A1C now down to 5.3 - followed by PCP.    6. CKD IIIB - Repeat CMET today.   Follow up APP clinic in 2 months  Izetta Henry, PharmD

## 2024-03-19 ENCOUNTER — Ambulatory Visit (HOSPITAL_COMMUNITY)
Admission: RE | Admit: 2024-03-19 | Discharge: 2024-03-19 | Disposition: A | Discharge status: Home / Self Care | Source: Ambulatory Visit | Attending: Cardiology | Admitting: Cardiology

## 2024-03-19 ENCOUNTER — Ambulatory Visit (HOSPITAL_COMMUNITY): Payer: Self-pay | Admitting: Cardiology

## 2024-03-19 VITALS — BP 140/58 | HR 65

## 2024-03-19 DIAGNOSIS — E1122 Type 2 diabetes mellitus with diabetic chronic kidney disease: Secondary | ICD-10-CM | POA: Insufficient documentation

## 2024-03-19 DIAGNOSIS — E785 Hyperlipidemia, unspecified: Secondary | ICD-10-CM | POA: Diagnosis not present

## 2024-03-19 DIAGNOSIS — G4733 Obstructive sleep apnea (adult) (pediatric): Secondary | ICD-10-CM | POA: Diagnosis not present

## 2024-03-19 DIAGNOSIS — R0602 Shortness of breath: Secondary | ICD-10-CM | POA: Insufficient documentation

## 2024-03-19 DIAGNOSIS — I272 Pulmonary hypertension, unspecified: Secondary | ICD-10-CM | POA: Diagnosis present

## 2024-03-19 DIAGNOSIS — I252 Old myocardial infarction: Secondary | ICD-10-CM | POA: Diagnosis not present

## 2024-03-19 DIAGNOSIS — I129 Hypertensive chronic kidney disease with stage 1 through stage 4 chronic kidney disease, or unspecified chronic kidney disease: Secondary | ICD-10-CM | POA: Insufficient documentation

## 2024-03-19 DIAGNOSIS — N1832 Chronic kidney disease, stage 3b: Secondary | ICD-10-CM | POA: Insufficient documentation

## 2024-03-19 DIAGNOSIS — R600 Localized edema: Secondary | ICD-10-CM | POA: Diagnosis not present

## 2024-03-19 DIAGNOSIS — I2510 Atherosclerotic heart disease of native coronary artery without angina pectoris: Secondary | ICD-10-CM | POA: Diagnosis not present

## 2024-03-19 DIAGNOSIS — Z853 Personal history of malignant neoplasm of breast: Secondary | ICD-10-CM | POA: Diagnosis not present

## 2024-03-19 LAB — CBC
HCT: 29.3 % — ABNORMAL LOW (ref 36.0–46.0)
Hemoglobin: 9.7 g/dL — ABNORMAL LOW (ref 12.0–15.0)
MCH: 33.2 pg (ref 26.0–34.0)
MCHC: 33.1 g/dL (ref 30.0–36.0)
MCV: 100.3 fL — ABNORMAL HIGH (ref 80.0–100.0)
Platelets: 226 K/uL (ref 150–400)
RBC: 2.92 MIL/uL — ABNORMAL LOW (ref 3.87–5.11)
RDW: 12.9 % (ref 11.5–15.5)
WBC: 4.2 K/uL (ref 4.0–10.5)
nRBC: 0 % (ref 0.0–0.2)

## 2024-03-19 LAB — COMPREHENSIVE METABOLIC PANEL WITH GFR
ALT: 23 U/L (ref 0–44)
AST: 20 U/L (ref 15–41)
Albumin: 3.9 g/dL (ref 3.5–5.0)
Alkaline Phosphatase: 71 U/L (ref 38–126)
Anion gap: 10 (ref 5–15)
BUN: 25 mg/dL — ABNORMAL HIGH (ref 8–23)
CO2: 25 mmol/L (ref 22–32)
Calcium: 8.8 mg/dL — ABNORMAL LOW (ref 8.9–10.3)
Chloride: 102 mmol/L (ref 98–111)
Creatinine, Ser: 1.78 mg/dL — ABNORMAL HIGH (ref 0.44–1.00)
GFR, Estimated: 31 mL/min — ABNORMAL LOW (ref >60)
Glucose, Bld: 81 mg/dL (ref 70–99)
Potassium: 4.4 mmol/L (ref 3.5–5.1)
Sodium: 137 mmol/L (ref 135–145)
Total Bilirubin: 0.8 mg/dL (ref 0.0–1.2)
Total Protein: 6.8 g/dL (ref 6.5–8.1)

## 2024-03-19 MED ORDER — SILDENAFIL CITRATE 20 MG PO TABS: 80.0000 mg | ORAL_TABLET | Freq: Three times a day (TID) | ORAL | 3 refills | Status: DC

## 2024-03-19 NOTE — Patient Instructions (Addendum)
 It was a pleasure seeing you today!  MEDICATIONS: -We are changing your medications today -Increase sildenafil  to 80 mg (4 tablets) three times daily -Call if you have questions about your medications.  LABS: -We will call you if your labs need attention.  NEXT APPOINTMENT: Return to clinic in 2 months with PA/NP Clinic.  In general, to take care of your heart failure: -Limit your fluid intake to 2 Liters (half-gallon) per day.   -Limit your salt intake to ideally 2-3 grams (2000-3000 mg) per day. -Weigh yourself daily and record, and bring that weight diary to your next appointment.  (Weight gain of 2-3 pounds in 1 day typically means fluid weight.) -The medications for your heart are to help your heart and help you live longer.   -Please contact us  before stopping any of your heart medications.  Call the clinic at [phone] with questions or to reschedule future appointments.

## 2024-03-28 ENCOUNTER — Telehealth: Payer: Self-pay

## 2024-03-28 ENCOUNTER — Other Ambulatory Visit: Payer: Self-pay

## 2024-03-28 DIAGNOSIS — G4733 Obstructive sleep apnea (adult) (pediatric): Secondary | ICD-10-CM

## 2024-03-28 NOTE — Telephone Encounter (Signed)
 Copied from CRM [id number]. Topic: Clinical - Order For Equipment >> Mar 26, 2024 10:08 AM Rilla NOVAK wrote: Reason for CRM: Patient calling regarding CPAP supplies.  Called [employer] and they stated they needed an order. Patient would like all new supplies. Please call patient to follow @ [phone].   Order placed

## 2024-03-29 ENCOUNTER — Other Ambulatory Visit: Payer: Self-pay | Admitting: Cardiology

## 2024-03-29 DIAGNOSIS — I4891 Unspecified atrial fibrillation: Secondary | ICD-10-CM

## 2024-03-31 NOTE — Telephone Encounter (Signed)
 Prescription refill request for Eliquis  received. Indication: a fib Last office visit: 03/05/24 Scr: 1.78 epic 03/19/24 Age: 68 Weight: 77kg

## 2024-04-01 ENCOUNTER — Other Ambulatory Visit: Payer: Self-pay | Admitting: Cardiology

## 2024-04-01 DIAGNOSIS — E1169 Type 2 diabetes mellitus with other specified complication: Secondary | ICD-10-CM

## 2024-04-01 DIAGNOSIS — E1165 Type 2 diabetes mellitus with hyperglycemia: Secondary | ICD-10-CM

## 2024-04-16 ENCOUNTER — Ambulatory Visit: Admitting: Internal Medicine

## 2024-04-16 ENCOUNTER — Other Ambulatory Visit (HOSPITAL_COMMUNITY): Payer: Self-pay

## 2024-04-16 ENCOUNTER — Encounter: Payer: Self-pay | Admitting: Internal Medicine

## 2024-04-16 VITALS — BP 146/65 | HR 62 | Ht 66.0 in | Wt 165.0 lb

## 2024-04-16 DIAGNOSIS — G4734 Idiopathic sleep related nonobstructive alveolar hypoventilation: Secondary | ICD-10-CM | POA: Diagnosis not present

## 2024-04-16 DIAGNOSIS — I272 Pulmonary hypertension, unspecified: Secondary | ICD-10-CM

## 2024-04-16 DIAGNOSIS — J9611 Chronic respiratory failure with hypoxia: Secondary | ICD-10-CM

## 2024-04-16 DIAGNOSIS — R0609 Other forms of dyspnea: Secondary | ICD-10-CM

## 2024-04-16 DIAGNOSIS — G4733 Obstructive sleep apnea (adult) (pediatric): Secondary | ICD-10-CM | POA: Diagnosis not present

## 2024-04-16 MED ORDER — SILDENAFIL CITRATE 20 MG PO TABS: 80.0000 mg | ORAL_TABLET | Freq: Three times a day (TID) | ORAL | 3 refills | Status: AC

## 2024-04-16 NOTE — Patient Instructions (Addendum)
 Don't pay the bill for the overnight oxygen  level (on 2lpm and cpap) until you find out who did it so we can help interpret it  - please call with name/ number if possible   Dr Jude is your provider for CPAP  ([facility]) or Dr Dr Catherine here if you have problems with your CPAP (no need for follow up now)   Make sure you check your oxygen  saturation at your highest level of activity(NOT after you stop)  to be sure it stays over 90% and keep track of it at least once a week, more often if breathing getting worse, and let me know if losing ground. (Collect the dots to connect the dots approach)     Pulmonary clinic follow up is as needed

## 2024-04-16 NOTE — Assessment & Plan Note (Addendum)
 As of 04/16/2024  on cpap and 2lpm HS  - ONO on 2lpm and cpap settings requested 04/16/2024 >>>

## 2024-04-16 NOTE — Assessment & Plan Note (Addendum)
 09/18/22  DIAGNOSIS - Obstructive Sleep Apnea (G47.33) - Nocturnal hypoxia - Periodic Limb Movement During Sleep (G47.61)  Rec  CPAP therapy on 15 cm H2O with a Small size [employer] Full Face Simplus mask and heated humidification. - 2L O2 was blended into CPAP  Continue present settings  pending ONO on 2lpm / cpap setting as above and f/u with either DR Jude at [facility] or Dr Catherine in [facility] office.  Discussed in detail all the  indications, usual  risks and alternatives  relative to the benefits with patient who agrees to proceed with Rx as outlined.      Each maintenance medication was reviewed in detail including emphasizing most importantly the difference between maintenance and prns and under what circumstances the prns are to be triggered using an action plan format where appropriate.  Total time for H and P, chart review, counseling, reviewing 02/ pulse ox/ cpap device(s) , directly observing portions of ambulatory 02 saturation study/ and generating customized AVS unique to this office visit / same day charting = 35 min summary final f/u ov

## 2024-04-16 NOTE — Assessment & Plan Note (Addendum)
 Onset 12/2021 worse than usual DOE - restrictive physiology with ERV 8% @ 275 lbs by pfts 03/2022  - new dx resting hypoxemia 09/20/2022 see sep a/p - Venous dopplers neg DVT 09/25/2022 bilaterally  -  Amiodarone  rx May 2024 >>>  -  AT wt 165  (vs 275 at PFts 03/2022)   04/16/2024   Walked on RA   x  3  lap(s) =  approx 450  ft  @ fast pace, stopped due to end of study  with lowest 02 sats 98% s sob or cp so only rec 02  l2pm  at hs (see OSA) on cpap    Clearly problem was obesity and chf and no longer needs 02 x at hs on cpap x 2lpm for now  and monitor daytime serial pulse ox RA at peak ex weekly while on Amiodarone  to monitor fo amio toxicity.  Patients typically have been on amiodarone  for 6-12 months before this complication manifests.  Of note, serial clinical evaluation for symptoms such as cough dyspnea or fevers is  the preferred method of monitoring for pulmonary toxicity because a decrease in DLCO or lung volumes is a nonspecific for toxicity. Pathologically amiodarone  pulmonary toxicity may appear as interstitial pneumonitis, eosinophilic pneumonia, organizing pneumonia, pulmonary fibrosis or less commonly as diffuse alveolar hemorrhage, pulmonary nodules or pleural effusions.  Risk factors for pulmonary toxicity include age greater than 60, daily dose greater than equal to 400 mg, a high cumulative dose, or pre-existing lung disease    She only has the one risk factor bolded  so f/u  in pulmonary clinic is prn

## 2024-04-16 NOTE — Progress Notes (Signed)
 Sandra Schneider [place], female    DOB: 21-Jun-1955    MRN: [id number]   Brief patient profile:  47  yobf  never smoker  self referred to pulmonary clinic in [facility]  09/20/2022 s/p admit for hypoglycemia and they changed one of my pills     PMH -hypertension Diabetes type 2 CAD status post DES to LAD and left circumflex Breast cancer 2021 s/p lumpectomy and radiation, now on Femara   Significant tests/ events reviewed   01/2022 echo normal LVEF, enlarged RV, PASP 80 01/2022 LHC/RHC-CI 3.1, RA 32, PA 107/48, mean PA 69, PCWP?  Unable to estimate, elevated LVEDP   VBG 7.2 9/52/41   PFTs 03/2022 moderate restriction, ratio 84, FEV1 65%, FVC 59%, TLC 74%, DLCO 13.6/64%.  ERV 8% with wt 275    02/2022 VQ scan negative. CT chest without con 02/2022 right upper lobe nodule, mild right paratracheal right subcarinal lymphadenopathy, no evidence of ILD   PET scan 03/2022 no hypermetabolism in lung nodule or lymphadenopathy, possible rectal mass, breast mass mildly hypermetabolic SUV 2.8  Admit date:     09/07/2022  Discharge date: 09/09/22   Discharge Diagnoses: Principal Problem:   Hypoglycemia Active Problems:   Atherosclerotic heart disease of native coronary artery without angina pectoris   Essential (primary) hypertension   Hyperlipidemia   Type 2 diabetes mellitus with hyperglycemia (HCC)   Acute on chronic diastolic CHF (congestive heart failure) (HCC)   Chronic kidney disease   Brief Hospital admission course: As per H&P written by Dr. Andreas- Sonda on 09/08/22 Sandra Schneider is a 68 y.o. female with medical history significant of breast cancer, coronary artery disease, diabetes mellitus type 2, GERD, hypertension relatively newly diagnosed diastolic CHF, and more presents ED with a chief complaint of hypoglycemia.  Patient reports she was at a routine doctor's appointment where they did labs.  She was called and notified that her glucose was low.  At that time was 29.   They had checked her glucose and it was 55.  She drank some orange juice and checked it again it was 66.  30 minutes later was back down to 57.  Patient reports that normally she only checks her glucose every 2 or 3 days.  It has been running 71-101 at baseline per her report.  She reports that it does not get down to 71 that often.  Again patient is asymptomatic.  She was advised to come into the ER.  She does report that she took her glipizide on the 25th.  Regarding her glucose, patient has no other complaints at this time.   Patient does report that she has been doing diuretic adjustments secondary to her creatinine fluctuations in the outpatient setting.  She reports that when she was first diagnosed with CHF she gained 20 pounds in 2 weeks.  She feels like she is fluid overloaded again.  Her legs that do not have that much fluid in them but she feels like it is on her belly.  She does appear to have somewhat distended belly.  Patient reports dyspnea on exertion, orthopnea, and fluid retention.  She denies any chest pain, palpitations.  Patient does not wear CPAP at home, but likely needs to.  She reports she has a sleep study coming up.  Patient has no other complaints at this time.   Patient does not smoke, does not drink, does not use illicit drugs.  She is vaccinated for COVID and flu.    Assessment and  Plan: * Hypoglycemia - Patient had asymptomatic hypoglycemia -Patient's A1c 5.7; not requiring aggressive hypoglycemic regimen at the moment. -Patient advised to maintain adequate hydration and to follow modified carbohydrate diet -Glipizide has been discontinue and patient asked to continue just using metformin . -Continue monitoring CBGs closely, follow fluctuation and repeat A1c as an outpatient to further determine hypoglycemic regimen.   Chronic kidney disease -Stable and at baseline at time of discharge -Patient with chronic kidney disease stage IIIb at baseline. -Continue to closely  follow electrolytes and renal function trend. -Heart healthy diet and adequate hydration discussed with patient.   Acute on chronic diastolic CHF (congestive heart failure) (HCC) - Patient reports dyspnea on exertion, orthopnea, fluid retention. -After curbside and cardiology service medication regimen has been adjusted and patient discharged home on Lasix  60 mg twice a day. -She will continue close follow-up with cardiology service/heart failure team to further adjust her medications. -Daily weights, adequate hydration and low-sodium diet discussed with patient.     Type 2 diabetes mellitus with hyperglycemia (HCC) - A1c 5.7 -Glipizide discontinued at discharge as mentioned above -Continue the use of metformin  and adjusted dose as part of hypoglycemic regimen. -Patient advised to follow modified carbohydrate diet and maintain adequate hydration.   Hyperlipidemia - Continue statin -Heart healthy diet discussed with patient.   Essential (primary) hypertension - Stable and well-controlled with current antihypertensive agents -Heart healthy diet discussed with patient. -Continue to follow vital signs.   Atherosclerotic heart disease of native coronary artery without angina pectoris - Continue aspirin , statin, beta-blocker - Patient denies chest pain, palpitations, shortness of breath and diaphoresis at discharge. -Continue patient follow-up with cardiology service.     Class 3 obesity -Body mass index is 48.87 kg/m. -Low-calorie diet, portion control and increase physical activity discussed with patient.   Consultants: Cardiology service curbside. Procedures performed: See below for x-ray reports. Disposition: Home Diet recommendation: Heart healthy/low calorie diet and modified carbohydrates.     DISCHARGE MEDICATION: Allergies as of 09/09/2022   No Known Allergies         Medication List       STOP taking these medications     amLODipine  10 MG tablet Commonly known  as: NORVASC     chlorthalidone  25 MG tablet Commonly known as: HYGROTON     glipiZIDE 10 MG tablet Commonly known as: GLUCOTROL    lisinopril  40 MG tablet Commonly known as: ZESTRIL     metolazone  2.5 MG tablet Commonly known as: ZAROXOLYN            History of Present Illness  09/20/2022  Pulmonary/ 1st office eval/ Malayla Granberry / [place] [facility]  Chief Complaint  Patient presents with   Acute Visit    Pt acute visit, 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 Tomasa. Her O2 sats are currently between 87-89% on room air   Dyspnea:  onset 12/2021, still comfortable sitting still but walks only 15- 20 ft then gives out / rides scooter at grocery store x 5 months Cough: daily dry onset was prior to breathing getting worse and was on acei until last admit/ no better off it yet. Sleep: flat bed / 2 pillows / no 02 or cpap yet  SABA use: no inhalers  02: none  Rec We will try to get you portable oxygen  today > ideally adjust to keep your level above 90% by pulse oximetry We will try to get all your equipment for cpap/night time 02  to your house today   BNP  2885 and worsening CRI      04/16/2024   re-establish  ov/[place] office/Anahlia Iseminger re: chf maint on amiodarone   ? May of 2024  Chief Complaint  Patient presents with   Follow-up    No symptoms of shob or cough Completed an O[facility] with [facility] and wanted results   Dyspnea:  walking every day either at Y or home / no 02  rx or monitor  Cough: none  Sleeping: bed is flat / one pillow resp cc on CPAP per [place]  SABA use: none  02: 2lpm into cpap > doing fine but no    No obvious day to day or daytime variability or assoc excess/ purulent sputum or mucus plugs or hemoptysis or cp or chest tightness, subjective wheeze or overt sinus or hb symptoms.    Also denies any obvious fluctuation of symptoms with weather or environmental changes or other aggravating or alleviating factors except as outlined  above   No unusual exposure hx or h/o childhood pna/ asthma or knowledge of premature birth.  Current Allergies, Complete Past Medical History, Past Surgical History, Family History, and Social History were reviewed in [employer] record.  ROS  The following are not active complaints unless bolded Hoarseness, sore throat, dysphagia, dental problems, itching, sneezing,  nasal congestion or discharge of excess mucus or purulent secretions, ear ache,   fever, chills, sweats, unintended wt loss or wt gain, classically pleuritic or exertional cp,  orthopnea pnd or arm/hand swelling  or leg swelling, presyncope, palpitations, abdominal pain, anorexia, nausea, vomiting, diarrhea  or change in bowel habits or change in bladder habits, change in stools or change in urine, dysuria, hematuria,  rash, arthralgias, visual complaints, headache, numbness, weakness or ataxia or problems with walking or coordination,  change in mood or  memory.        Current Meds  Medication Sig   acetaminophen  (TYLENOL ) 325 MG tablet Take 650 mg by mouth every 6 (six) hours as needed for moderate pain.   amiodarone  (PACERONE ) 200 MG tablet Take 1 tablet by mouth once daily   amLODipine  (NORVASC ) 5 MG tablet Take 1 tablet by mouth once daily   apixaban  (ELIQUIS ) 5 MG TABS tablet Take 1 tablet by mouth twice daily   Ascorbic Acid (VITAMIN C PO) Take 1 tablet by mouth daily.   atorvastatin  (LIPITOR ) 40 MG tablet Take 1 tablet by mouth once daily   Blood Pressure Monitor DEVI 1 each by Does not apply route daily.   Carboxymethylcellul-Glycerin (REFRESH OPTIVE) 1-0.9 % GEL Place 1 drop into both eyes as needed (dry eyes).   cetirizine (ZYRTEC) 10 MG tablet Take 10 mg by mouth daily.   Cholecalciferol (VITAMIN D) 50 MCG (2000 UT) tablet Take 2,000 Units by mouth daily.   famotidine  (PEPCID ) 20 MG tablet Take 20 mg by mouth daily.   letrozole (FEMARA) 2.5 MG tablet Take 2.5 mg by mouth daily.   macitentan   (OPSUMIT ) 10 MG tablet Take 1 tablet (10 mg total) by mouth daily.   MOUNJARO  15 MG/0.5ML Pen INJECT 15 MG UNDER THE SKIN ONCE A WEEK   Multiple Vitamin (MULTIVITAMIN) tablet Take 1 tablet by mouth daily.   OXYGEN  Inhale 4 L into the lungs continuous.   Polyethyl Glycol-Propyl Glycol (SYSTANE) 0.4-0.3 % SOLN Place 1 drop into both eyes as needed (dry eyes).   potassium chloride  (KLOR-CON ) 10 MEQ tablet Take 2 tablets (20 mEq total) by mouth daily.  prednisoLONE acetate (PRED FORTE) 1 % ophthalmic suspension Place 1 drop into both eyes in the morning and at bedtime.   Selexipag  (UPTRAVI ) 1600 MCG TABS Take 1 tablet (1,600 mcg total) by mouth 2 (two) times daily.   sildenafil  (REVATIO ) 20 MG tablet Take 4 tablets (80 mg total) by mouth 3 (three) times daily.   torsemide  (DEMADEX ) 20 MG tablet TAKE 40MG  IN THE MORNING, AND 20MG  IN THE EVENING   traMADol (ULTRAM) 50 MG tablet Take 50 mg by mouth 4 (four) times daily as needed.       Past Medical History:  Diagnosis Date   Breast cancer (HCC)    Coronary artery disease    Diabetes mellitus    GERD (gastroesophageal reflux disease)    Hypertension    Myocardial abscess    Myocardial infarct (HCC) 06/24/2012   Pulmonary hypertension (HCC)        Objective:    wts     04/16/2024       165  09/20/22 284 lb (128.8 kg)  09/09/22 293 lb 10.4 oz (133.2 kg)  09/06/22 291 lb 9.6 oz (132.3 kg)    Vital signs reviewed  04/16/2024  - Note at rest 02 sats  99% on RA   General appearance:    amb bf all smiles today - looks great    HEENT : Oropharynx  clear    Nasal turbinates nl    NECK :  without  apparent JVD/ palpable Nodes/TM    LUNGS: no acc muscle use,  Nl contour chest which is clear to A and P bilaterally without cough on insp or exp maneuvers   CV:  RRR  no s3 or murmur or increase in P2, and no edema   ABD:  soft and nontender   MS:  Gait nl   ext warm without deformities Or obvious joint restrictions  calf tenderness,  cyanosis or clubbing    SKIN: warm and dry without lesions    NEURO:  alert, approp, nl sensorium with  no motor or cerebellar deficits apparent.       Assessment & Plan Chronic respiratory failure with hypoxia (HCC)  DOE (dyspnea on exertion) Onset 12/2021 worse than usual DOE - restrictive physiology with ERV 8% @ 275 lbs by pfts 03/2022  - new dx resting hypoxemia 09/20/2022 see sep a/p - Venous dopplers neg DVT 09/25/2022 bilaterally  -  Amiodarone  rx May 2024 >>>  -  AT wt 165  (vs 275 at PFts 03/2022)   04/16/2024   Walked on RA   x  3  lap(s) =  approx 450  ft  @ fast pace, stopped due to end of study  with lowest 02 sats 98% s sob or cp so only rec 02  l2pm  at hs (see OSA) on cpap    Clearly problem was obesity and chf and no longer needs 02 x at hs on cpap x 2lpm for now  and monitor daytime serial pulse ox RA at peak ex weekly while on Amiodarone  to monitor fo amio toxicity.  Patients typically have been on amiodarone  for 6-12 months before this complication manifests.  Of note, serial clinical evaluation for symptoms such as cough dyspnea or fevers is  the preferred method of monitoring for pulmonary toxicity because a decrease in DLCO or lung volumes is a nonspecific for toxicity. Pathologically amiodarone  pulmonary toxicity may appear as interstitial pneumonitis, eosinophilic pneumonia, organizing pneumonia, pulmonary fibrosis or less commonly as diffuse alveolar hemorrhage,  pulmonary nodules or pleural effusions.  Risk factors for pulmonary toxicity include age greater than 60, daily dose greater than equal to 400 mg, a high cumulative dose, or pre-existing lung disease    She only has the one risk factor bolded  so f/u  in pulmonary clinic is prn  Nocturnal hypoxemia As of 04/16/2024  on cpap and 2lpm HS  - ONO on 2lpm and cpap settings requested 04/16/2024 >>>   OSA on CPAP 09/18/22  DIAGNOSIS - Obstructive Sleep Apnea (G47.33) - Nocturnal hypoxia - Periodic Limb Movement  During Sleep (G47.61)  Rec  CPAP therapy on 15 cm H2O with a Small size [employer] Full Face Simplus mask and heated humidification. - 2L O2 was blended into CPAP  Continue present settings  pending ONO on 2lpm / cpap setting as above and f/u with either DR Jude at [facility] or Dr Catherine in [facility] office.  Discussed in detail all the  indications, usual  risks and alternatives  relative to the benefits with patient who agrees to proceed with Rx as outlined.      Each maintenance medication was reviewed in detail including emphasizing most importantly the difference between maintenance and prns and under what circumstances the prns are to be triggered using an action plan format where appropriate.  Total time for H and P, chart review, counseling, reviewing 02/ pulse ox/ cpap device(s) , directly observing portions of ambulatory 02 saturation study/ and generating customized AVS unique to this office visit / same day charting = 35 min summary final f/u ov                  AVS  Patient Instructions  Don't pay the bill for the overnight oxygen  level (on 2lpm and cpap) until you find out who did it so we can help interpret it  - please call with name/ number if possible   Dr Jude is your provider for CPAP  ([facility]) or Dr Dr Catherine here if you have problems with your CPAP (no need for follow up now)   Make sure you check your oxygen  saturation at your highest level of activity(NOT after you stop)  to be sure it stays over 90% and keep track of it at least once a week, more often if breathing getting worse, and let me know if losing ground. (Collect the dots to connect the dots approach)     Pulmonary clinic follow up is as needed    Ozell America, MD 04/16/2024

## 2024-05-02 ENCOUNTER — Telehealth (HOSPITAL_COMMUNITY): Payer: Self-pay

## 2024-05-02 ENCOUNTER — Other Ambulatory Visit (HOSPITAL_COMMUNITY): Payer: Self-pay

## 2024-05-03 ENCOUNTER — Other Ambulatory Visit (HOSPITAL_COMMUNITY): Payer: Self-pay | Admitting: Internal Medicine

## 2024-05-05 ENCOUNTER — Other Ambulatory Visit (HOSPITAL_COMMUNITY): Payer: Self-pay

## 2024-05-05 NOTE — Telephone Encounter (Signed)
 Advanced Heart Failure Patient Advocate Encounter  Prior authorization for Sildenafil  has been submitted and approved. Test billing returns refill too soon rejection, unable to confirm copay at this time.  Key: [id number] Effective: 05/02/2024 to 05/14/2025  Rachel DEL, CPhT Rx Patient Advocate Phone: [phone]

## 2024-05-20 ENCOUNTER — Telehealth (HOSPITAL_COMMUNITY): Payer: Self-pay

## 2024-05-20 NOTE — Progress Notes (Signed)
 "  ADVANCED HEART FAILURE CLINIC NOTE   Primary Care: [facility] Pulmonologist: Dr. Jude Primary Cardiologist: Dr. Dorn Ross HF Cardiologist: assign to Dr. Zenaida  HPI: Sandra Schneider is a 69 y.o. female with CAD (NSTEMI 06/2012 w/ PCI to LAD and Lcx) LVEF 55% at that time, OSA, HTN, CKD, HLD, hx of breast ca s/p lumpectomy, AFL and PAH.  She had worsening LEE and DOE x months towards the end of 2023. She had a echocardiogram concerning for pulmonary hypertension, followed by RHC with a mean PA of 69 mmHg, CO was 7.  She had a workup for interstitial lung disease by pulmonology, with CT chest that was negative.  There was a RUL lung nodule with negative PET scan.  In addition she has had a colonoscopy and mammogram due to hypermetabolic areas in the colon and breast both of which were also negative.   She was started on sildenafil  20 mg tid, unfortunately presented to AHF for follow up 5/24 markedly volume overloaded and was directly admitted from clinic for diuresis. Underwent RHC showing markedly elevated right filling pressures, low PAPi, severe pulmonary arterial hypertension and low CO. Started on milrinone  and Lasix  gtts. Hospitalization complicated by new AFL and she underwent TEE-guided DCCV to NSR, unfortunately had ERAF. Eventually drips weaned and underwent repeat DCCV (off milrinone ) to NSR. GDMT titrated and she was discharged home.   RHC 6/24 showed severely elevated pre-capillary filling pressures, PA mean and PVR consistent with Group 1 + 3 PH, CI preserved at 3.15. Started on Opsumit .  RHC 8/24 showed low pre and post capillary filling pressures, elevated PVR and PA mean with normal CO, she was started on Uptravi .  Echo 2/25 showed EF 65-70%, RV ok, RVSP 67.3 mmHg  Today she returns for HF follow up. Overall feeling fine. No undue dyspnea with activity. Frequently works out at [employer], enjoys [employer] and fitness classes. Denies  increasing SOB, palpitations, abnormal bleeding, CP, dizziness, edema, or PND/Orthopnea. Appetite ok. Weight at home 167-171 pounds. Taking all medications. She uses oxygen  PRN, wears CPAP. Down almost 100 lbs since starting GLP1!  Current Outpatient Medications  Medication Sig Dispense Refill   acetaminophen  (TYLENOL ) 325 MG tablet Take 650 mg by mouth every 6 (six) hours as needed for moderate pain.     amiodarone  (PACERONE ) 200 MG tablet Take 1 tablet by mouth once daily 60 tablet 11   amLODipine  (NORVASC ) 5 MG tablet Take 1 tablet by mouth once daily 90 tablet 3   apixaban  (ELIQUIS ) 5 MG TABS tablet Take 1 tablet by mouth twice daily 180 tablet 1   Ascorbic Acid (VITAMIN C PO) Take 1 tablet by mouth daily.     atorvastatin  (LIPITOR ) 40 MG tablet Take 1 tablet by mouth once daily 90 tablet 3   Blood Pressure Monitor DEVI 1 each by Does not apply route daily. 1 each 0   Carboxymethylcellul-Glycerin (REFRESH OPTIVE) 1-0.9 % GEL Place 1 drop into both eyes as needed (dry eyes).     cetirizine (ZYRTEC) 10 MG tablet Take 10 mg by mouth daily.     Cholecalciferol (VITAMIN D) 50 MCG (2000 UT) tablet Take 2,000 Units by mouth daily.     famotidine  (PEPCID ) 20 MG tablet Take 20 mg by mouth daily.     letrozole (FEMARA) 2.5 MG tablet Take 2.5 mg by mouth daily.     macitentan  (OPSUMIT ) 10 MG tablet Take 1 tablet (10 mg total) by mouth daily. 90 tablet  3   MOUNJARO  15 MG/0.5ML Pen INJECT 15 MG UNDER THE SKIN ONCE A WEEK 6 mL 0   Multiple Vitamin (MULTIVITAMIN) tablet Take 1 tablet by mouth daily.     OXYGEN  Inhale 4 L into the lungs continuous. (Patient taking differently: Inhale 4 L into the lungs as needed.)     Polyethyl Glycol-Propyl Glycol (SYSTANE) 0.4-0.3 % SOLN Place 1 drop into both eyes as needed (dry eyes).     potassium chloride  (KLOR-CON ) 10 MEQ tablet Take 2 tablets by mouth once daily 60 tablet 0   prednisoLONE acetate (PRED FORTE) 1 % ophthalmic suspension Place 1 drop into both eyes in  the morning and at bedtime.     Selexipag  (UPTRAVI ) 1600 MCG TABS Take 1 tablet (1,600 mcg total) by mouth 2 (two) times daily. 60 tablet 11   sildenafil  (REVATIO ) 20 MG tablet Take 4 tablets (80 mg total) by mouth 3 (three) times daily. 360 tablet 3   torsemide  (DEMADEX ) 20 MG tablet TAKE 40MG  IN THE MORNING, AND 20MG  IN THE EVENING 120 tablet 6   traMADol (ULTRAM) 50 MG tablet Take 50 mg by mouth 4 (four) times daily as needed.     No current facility-administered medications for this encounter.   No Known Allergies  Wt Readings from Last 3 Encounters:  05/21/24 75.5 kg (166 lb 6.4 oz)  04/16/24 74.8 kg (165 lb)  03/05/24 77.3 kg (170 lb 6.4 oz)   BP (!) 132/58   Pulse 65   Wt 75.5 kg (166 lb 6.4 oz)   LMP 01/14/2011   SpO2 99%   BMI 26.86 kg/m   PHYSICAL EXAM: General:  NAD. No resp difficulty, walked into clinic HEENT: Normal Neck: Supple. No JVD. Cor: Regular rate & rhythm. No rubs, gallops or murmurs. Lungs: Clear Abdomen: Soft, nontender, nondistended.  Extremities: No cyanosis, clubbing, rash, edema Neuro: Alert & oriented x 3, moves all 4 extremities w/o difficulty. Affect pleasant.   DATA REVIEW  ECG: - 05/21/24: NSR with 1AVB, 65 bpm, PR 250 msec  ECHO: - 01/18/22: LVBEF 60-65%. Moderately dilated RV with mild reduction in function. Dilated RA. - 2/25: EF 65-70%, RV ok, RVSP 67.3 mmHg  CMR - 5/24: LVEF 75%, moderate RV dilation with RVEF 50%, moderate TR, mid-wall basal septal/basal inferolateral LGE.  CATH: - 02/08/23:   Prox Cx to Dist Cx lesion is 25% stenosed.   Mid LAD lesion is 25% stenosed.   2nd Mrg lesion is 100% stenosed.  Right to left collaterals.  Left to left collaterals.   The left ventricular systolic function is normal.   LV end diastolic pressure is mildly elevated.   The left ventricular ejection fraction is greater than 65% by visual estimate.   Hemodynamic findings consistent with severe pulmonary hypertension.   There is no aortic  valve stenosis.   Aortic saturation 96% on 3 L nasal cannula, PA saturation 69% on 3 L, mean RA pressure 32 mm Hg; PA pressure 107/48, mean PA pressure 69 mmHg, pulmonary capillary wedge pressure difficult to assess due to significant V waves, and difficulty wedging.  Cardiac output 7.03 L/min, cardiac index 3.07.   Patent stents with mild restenosis in the LAD and circumflex.  Small obtuse marginal appears occluded distally with some right to left collaterals.  No target for PCI.  - RHC 09/28/22: RA 32 RV 95/32 PA 93/44, mean 66 PCWP mean 19  Oxygen  saturations: PA 49% AO 96%  Cardiac Output (Fick) 3.98  Cardiac Index (Fick) 1.73  PVR 11.8 PAPi 1.5   - 10/31/22:  HEMODYNAMICS: RA:                  10 mmHg (mean) RV:                  104/3-10 mmHg PA:                  100/27 mmHg (57 mean) PCWP:            9 mmHg (mean)                                      Estimated Fick CO/CI   6.7 L/min, 3.15 L/min/m2                                            TPG                 48  mmHg                                            PVR                 7 Wood Units  PAPi                10.4    PFTS:  03/2022 moderate restriction, ratio 84, FEV1 65%, FVC 59%, TLC 74%, DLCO 13.6/64%.  ERV 8% with wt 275   :  - 8/24:  Pt ambulated 739ft (228.59m) O2 Sat ranged 100-95% on 4L oxygen  HR ranged 71-88  - 2/25  Pt ambulated 1100 ft (335.28 m) O2 Sat ranged 99-100 on 2 L oxygen  HR ranged 65-71   - 1/26: Pt ambulated 875 ft (266.7 m) O2 Sat ranged 96-99 on 0 L oxygen  HR ranged 69-75   ASSESSMENT & PLAN:  Pulmonary hypertension, Group I + III - Initial RHC waveforms reviewed by MD; RA 30, RV 107/20-30, PA 107/48 (69), unable to wedge; LVEDP 15. AO 140/64. LVEDP ~16. Assuming a PCWP of 20, TPG would be 49 w/ PVR of 7.  - Hemodynamics consistent with severe pulmonary hypertension, largely due to pre-capillary PH.  - Cardiac MRI in 5/24 with EF 75%, moderate RV dilation with RV EF 50%,  moderate TR, mid-wall basal septal/basal inferolateral LGE. Could be seen with cardiac sarcoidosis (vs prior myocarditis).  - CT chest showed RUL nodule (negative on PET) and mediastinal LAN.  - V/Q scan not suggestive of chronic PEs.  - RHC 5/24 showed severe PAH with low CI 1.73, PAPi 1.5, RA pressure 32 (R>>L heart failure).  - TEE 5/24: LVEF 60-65%, D-shaped septum, moderate RV dilation with moderate RV dysfunction, mod-severe TR. Likely end-stage PH/cor pulmonale d/t OHS/OSA and group 1 PAH.  - Labs: ANA, HCV negative. HIV NR. LFTs mostly unremarkable.  - PFTs w/ moderate restriction.  - Completed Pulm Rehab - Continue with home oxygen  PRN - Continue sildenafil  80 mg tid - Continue Opsumit  10 mg daily - Continue Uptravi  1600 bid - Continue torsemide  40/20 + 40 KCL daily. - mildly worse today, but symptoms stable. - Defer timing of repeat RHC to Dr. Zenaida - Check BMET  and pro-BNP today. - Update echo next visit  CAD - LHC as above - followed by general cardiology.  - No chest pain  CKD IIIb - Baseline SCr 1.4-1.7 - Avoid hypotension - Consider SGLT2i - BMET today.  AFL - New diagnosis 09/2022 - s/p TEE-guided DCCV to NSR 09/29/22, had ERAF. Underwent DCCV to NSR 10/05/22.  - NSR on ECG today - Decrease amio to 100 mg daily - Continue Eliquis  5 mg bid - Check CBC, LFTs and TSH today.  HTN - BP elevated - Increase amlodipine  to 7.5 mg daily - Continue CPAP  Obesity - Weight down from 305-->166 lbs! - Body mass index is 26.86 kg/m.   - Doing well on GLP1  OSA - Followed by pulmonology, Dr. Jude.  - Now using CPAP nightly - Oxygen  99% on RA today.  T2DM - On GLP1 - A1C now down to 5.3  - followed by PCP.   Follow up in 4 months with Dr. Zenaida + echo  [place], FNP-BC 05/21/2024  "

## 2024-05-20 NOTE — Telephone Encounter (Signed)
 Called to confirm/remind patient of their appointment at the [facility] on 05/21/24.   Appointment:   [] Confirmed  [x] Left mess   [] No answer/No voice mail  [] VM Full/unable to leave message  [] Phone not in service  And to bring in all medications and/or complete list.

## 2024-05-21 ENCOUNTER — Ambulatory Visit (HOSPITAL_COMMUNITY): Payer: Self-pay | Admitting: Family Medicine

## 2024-05-21 ENCOUNTER — Encounter (HOSPITAL_COMMUNITY): Payer: Self-pay

## 2024-05-21 ENCOUNTER — Ambulatory Visit (HOSPITAL_COMMUNITY)
Admission: RE | Admit: 2024-05-21 | Discharge: 2024-05-21 | Disposition: A | Discharge status: Home / Self Care | Source: Ambulatory Visit | Attending: Family Medicine | Admitting: Family Medicine

## 2024-05-21 VITALS — BP 132/58 | HR 65 | Wt 166.4 lb

## 2024-05-21 DIAGNOSIS — Z79899 Other long term (current) drug therapy: Secondary | ICD-10-CM | POA: Insufficient documentation

## 2024-05-21 DIAGNOSIS — E1122 Type 2 diabetes mellitus with diabetic chronic kidney disease: Secondary | ICD-10-CM | POA: Insufficient documentation

## 2024-05-21 DIAGNOSIS — E669 Obesity, unspecified: Secondary | ICD-10-CM | POA: Insufficient documentation

## 2024-05-21 DIAGNOSIS — Z170 Estrogen receptor positive status [ER+]: Secondary | ICD-10-CM | POA: Diagnosis not present

## 2024-05-21 DIAGNOSIS — Z853 Personal history of malignant neoplasm of breast: Secondary | ICD-10-CM | POA: Diagnosis not present

## 2024-05-21 DIAGNOSIS — Z7901 Long term (current) use of anticoagulants: Secondary | ICD-10-CM | POA: Diagnosis not present

## 2024-05-21 DIAGNOSIS — I10 Essential (primary) hypertension: Secondary | ICD-10-CM | POA: Diagnosis not present

## 2024-05-21 DIAGNOSIS — I2510 Atherosclerotic heart disease of native coronary artery without angina pectoris: Secondary | ICD-10-CM | POA: Insufficient documentation

## 2024-05-21 DIAGNOSIS — E1169 Type 2 diabetes mellitus with other specified complication: Secondary | ICD-10-CM | POA: Diagnosis not present

## 2024-05-21 DIAGNOSIS — I272 Pulmonary hypertension, unspecified: Secondary | ICD-10-CM | POA: Insufficient documentation

## 2024-05-21 DIAGNOSIS — E785 Hyperlipidemia, unspecified: Secondary | ICD-10-CM | POA: Insufficient documentation

## 2024-05-21 DIAGNOSIS — Z955 Presence of coronary angioplasty implant and graft: Secondary | ICD-10-CM | POA: Insufficient documentation

## 2024-05-21 DIAGNOSIS — Z7985 Long-term (current) use of injectable non-insulin antidiabetic drugs: Secondary | ICD-10-CM | POA: Diagnosis not present

## 2024-05-21 DIAGNOSIS — I509 Heart failure, unspecified: Secondary | ICD-10-CM | POA: Diagnosis present

## 2024-05-21 DIAGNOSIS — N1832 Chronic kidney disease, stage 3b: Secondary | ICD-10-CM | POA: Insufficient documentation

## 2024-05-21 DIAGNOSIS — C50411 Malignant neoplasm of upper-outer quadrant of right female breast: Secondary | ICD-10-CM | POA: Diagnosis not present

## 2024-05-21 DIAGNOSIS — I4892 Unspecified atrial flutter: Secondary | ICD-10-CM | POA: Diagnosis not present

## 2024-05-21 DIAGNOSIS — I252 Old myocardial infarction: Secondary | ICD-10-CM | POA: Insufficient documentation

## 2024-05-21 DIAGNOSIS — I129 Hypertensive chronic kidney disease with stage 1 through stage 4 chronic kidney disease, or unspecified chronic kidney disease: Secondary | ICD-10-CM | POA: Insufficient documentation

## 2024-05-21 DIAGNOSIS — N183 Chronic kidney disease, stage 3 unspecified: Secondary | ICD-10-CM

## 2024-05-21 DIAGNOSIS — G4733 Obstructive sleep apnea (adult) (pediatric): Secondary | ICD-10-CM | POA: Diagnosis not present

## 2024-05-21 DIAGNOSIS — Z6826 Body mass index (BMI) 26.0-26.9, adult: Secondary | ICD-10-CM | POA: Diagnosis not present

## 2024-05-21 LAB — COMPREHENSIVE METABOLIC PANEL WITH GFR
ALT: 30 U/L (ref 0–44)
AST: 30 U/L (ref 15–41)
Albumin: 4.5 g/dL (ref 3.5–5.0)
Alkaline Phosphatase: 107 U/L (ref 38–126)
Anion gap: 10 (ref 5–15)
BUN: 29 mg/dL — ABNORMAL HIGH (ref 8–23)
CO2: 28 mmol/L (ref 22–32)
Calcium: 9.2 mg/dL (ref 8.9–10.3)
Chloride: 100 mmol/L (ref 98–111)
Creatinine, Ser: 1.49 mg/dL — ABNORMAL HIGH (ref 0.44–1.00)
GFR, Estimated: 38 mL/min — ABNORMAL LOW
Glucose, Bld: 82 mg/dL (ref 70–99)
Potassium: 4.2 mmol/L (ref 3.5–5.1)
Sodium: 138 mmol/L (ref 135–145)
Total Bilirubin: 0.4 mg/dL (ref 0.0–1.2)
Total Protein: 7.6 g/dL (ref 6.5–8.1)

## 2024-05-21 LAB — PRO BRAIN NATRIURETIC PEPTIDE: Pro Brain Natriuretic Peptide: 159 pg/mL

## 2024-05-21 LAB — CBC
HCT: 29.8 % — ABNORMAL LOW (ref 36.0–46.0)
Hemoglobin: 10.2 g/dL — ABNORMAL LOW (ref 12.0–15.0)
MCH: 34.3 pg — ABNORMAL HIGH (ref 26.0–34.0)
MCHC: 34.2 g/dL (ref 30.0–36.0)
MCV: 100.3 fL — ABNORMAL HIGH (ref 80.0–100.0)
Platelets: 196 K/uL (ref 150–400)
RBC: 2.97 MIL/uL — ABNORMAL LOW (ref 3.87–5.11)
RDW: 12.7 % (ref 11.5–15.5)
WBC: 4.8 K/uL (ref 4.0–10.5)
nRBC: 0 % (ref 0.0–0.2)

## 2024-05-21 LAB — TSH: TSH: 0.62 u[IU]/mL (ref 0.350–4.500)

## 2024-05-21 MED ORDER — AMIODARONE HCL 200 MG PO TABS: 100.0000 mg | ORAL_TABLET | Freq: Every day | ORAL | Status: AC

## 2024-05-21 MED ORDER — AMLODIPINE BESYLATE 5 MG PO TABS: 7.5000 mg | ORAL_TABLET | Freq: Every day | ORAL | 3 refills | Status: AC

## 2024-05-21 NOTE — Progress Notes (Signed)
 6 Min Walk Test Completed  Pt ambulated 875 ft (266.7 m) O2 Sat ranged 96-99 on 0 L oxygen  HR ranged 69-75

## 2024-05-21 NOTE — Patient Instructions (Addendum)
 Good to see you today!   DECREASE Amiodarone  to 100 mg (1/2 tablet) daily  INCREASE Amlodipine  to 7.5 mg (1 1/2 tablets) daily  Labs done today, your results will be available in MyChart, we will contact you for abnormal readings.  Your physician has requested that you have an echocardiogram. Echocardiography is a painless test that uses sound waves to create images of your heart. It provides your doctor with information about the size and shape of your heart and how well your hearts chambers and valves are working. This procedure takes approximately one hour. There are no restrictions for this procedure. Please do NOT wear cologne, perfume, aftershave, or lotions (deodorant is allowed). Please arrive 15 minutes prior to your appointment time.  Please note: We ask at that you not bring children with you during ultrasound (echo/ vascular) testing. Due to room size and safety concerns, children are not allowed in the ultrasound rooms during exams. Our front office staff cannot provide observation of children in our lobby area while testing is being conducted. An adult accompanying a patient to their appointment will only be allowed in the ultrasound room at the discretion of the ultrasound technician under special circumstances. We apologize for any inconvenience.  Your physician recommends that you schedule a follow-up appointment 4 months with echocardiogram(May) Call office in March to schedule an appointment  If you have any questions or concerns before your next appointment please send us  a message through [place] or call our office at [phone].    TO LEAVE A MESSAGE FOR THE NURSE SELECT OPTION 2, PLEASE LEAVE A MESSAGE INCLUDING: YOUR NAME DATE OF BIRTH CALL BACK NUMBER REASON FOR CALL**this is important as we prioritize the call backs  YOU WILL RECEIVE A CALL BACK THE SAME DAY AS LONG AS YOU CALL BEFORE 4:00 PM At the [facility], you and your health needs are our  priority. As part of our continuing mission to provide you with exceptional heart care, we have created designated Provider Care Teams. These Care Teams include your primary Cardiologist (physician) and Advanced Practice Providers (APPs- Physician Assistants and Nurse Practitioners) who all work together to provide you with the care you need, when you need it.   You may see any of the following providers on your designated Care Team at your next follow up: Dr Toribio Fuel Dr Ezra Shuck Dr. Morene Brownie Greig Mosses, NP Caffie Shed, [place], [place] Beckey Coe, NP Jordan Lee, NP Ellouise Class, NP Tinnie Redman, PharmD Jaun Bash, PharmD   Please be sure to bring in all your medications bottles to every appointment.    Thank you for choosing  [facility]

## 2024-06-07 ENCOUNTER — Other Ambulatory Visit (HOSPITAL_COMMUNITY): Payer: Self-pay | Admitting: Internal Medicine
